# Patient Record
Sex: Female | Born: 1935 | Race: White | Hispanic: No | Marital: Married | Smoking: Former smoker
Health system: Southern US, Community
[De-identification: ages and names within clinical notes are randomized; demographics above are authoritative.]

## PROBLEM LIST (undated history)

## (undated) DIAGNOSIS — R7989 Other specified abnormal findings of blood chemistry: Secondary | ICD-10-CM

## (undated) DIAGNOSIS — M810 Age-related osteoporosis without current pathological fracture: Secondary | ICD-10-CM

## (undated) DIAGNOSIS — E079 Disorder of thyroid, unspecified: Secondary | ICD-10-CM

## (undated) DIAGNOSIS — E039 Hypothyroidism, unspecified: Secondary | ICD-10-CM

## (undated) DIAGNOSIS — L719 Rosacea, unspecified: Secondary | ICD-10-CM

## (undated) DIAGNOSIS — M1990 Unspecified osteoarthritis, unspecified site: Secondary | ICD-10-CM

## (undated) DIAGNOSIS — I350 Nonrheumatic aortic (valve) stenosis: Secondary | ICD-10-CM

## (undated) DIAGNOSIS — K743 Primary biliary cirrhosis: Secondary | ICD-10-CM

## (undated) DIAGNOSIS — I10 Essential (primary) hypertension: Secondary | ICD-10-CM

## (undated) DIAGNOSIS — B029 Zoster without complications: Secondary | ICD-10-CM

## (undated) DIAGNOSIS — I509 Heart failure, unspecified: Secondary | ICD-10-CM

## (undated) DIAGNOSIS — I059 Rheumatic mitral valve disease, unspecified: Secondary | ICD-10-CM

## (undated) DIAGNOSIS — I2510 Atherosclerotic heart disease of native coronary artery without angina pectoris: Secondary | ICD-10-CM

## (undated) DIAGNOSIS — C801 Malignant (primary) neoplasm, unspecified: Secondary | ICD-10-CM

## (undated) DIAGNOSIS — H3530 Unspecified macular degeneration: Secondary | ICD-10-CM

## (undated) DIAGNOSIS — Z944 Liver transplant status: Secondary | ICD-10-CM

## (undated) DIAGNOSIS — H547 Unspecified visual loss: Secondary | ICD-10-CM

## (undated) DIAGNOSIS — I639 Cerebral infarction, unspecified: Secondary | ICD-10-CM

## (undated) DIAGNOSIS — G459 Transient cerebral ischemic attack, unspecified: Secondary | ICD-10-CM

## (undated) HISTORY — PX: JOINT REPLACEMENT: SHX530

## (undated) HISTORY — PX: CORONARY ARTERY BYPASS GRAFT: SHX141

## (undated) HISTORY — PX: BOWEL RESECTION: SHX1257

## (undated) HISTORY — PX: COLONOSCOPY: SHX174

## (undated) HISTORY — PX: LITHOTRIPSY: SUR834

## (undated) HISTORY — PX: HIP ARTHROSCOPY: SUR88

## (undated) HISTORY — DX: Nonrheumatic aortic (valve) stenosis: I35.0

## (undated) HISTORY — DX: Unspecified osteoarthritis, unspecified site: M19.90

## (undated) HISTORY — DX: Rosacea, unspecified: L71.9

## (undated) HISTORY — DX: Hypothyroidism, unspecified: E03.9

## (undated) HISTORY — DX: Other specified abnormal findings of blood chemistry: R79.89

## (undated) HISTORY — DX: Age-related osteoporosis without current pathological fracture: M81.0

---

## 1988-04-23 HISTORY — PX: LIVER TRANSPLANT: SHX410

## 2004-02-09 ENCOUNTER — Other Ambulatory Visit: Payer: Self-pay

## 2004-02-09 ENCOUNTER — Emergency Department: Payer: Self-pay | Admitting: Emergency Medicine

## 2004-03-20 ENCOUNTER — Ambulatory Visit: Payer: Self-pay | Admitting: Unknown Physician Specialty

## 2005-04-17 ENCOUNTER — Emergency Department: Payer: Self-pay | Admitting: Emergency Medicine

## 2005-04-17 ENCOUNTER — Other Ambulatory Visit: Payer: Self-pay

## 2005-06-19 ENCOUNTER — Ambulatory Visit: Payer: Self-pay | Admitting: Internal Medicine

## 2005-10-26 ENCOUNTER — Ambulatory Visit: Payer: Self-pay | Admitting: Rheumatology

## 2006-06-28 ENCOUNTER — Emergency Department: Payer: Self-pay | Admitting: General Practice

## 2006-06-28 ENCOUNTER — Other Ambulatory Visit: Payer: Self-pay

## 2006-07-09 ENCOUNTER — Ambulatory Visit: Payer: Self-pay | Admitting: Internal Medicine

## 2006-08-12 ENCOUNTER — Ambulatory Visit: Payer: Self-pay | Admitting: Ophthalmology

## 2006-10-06 ENCOUNTER — Other Ambulatory Visit: Payer: Self-pay

## 2006-10-06 ENCOUNTER — Emergency Department: Payer: Self-pay | Admitting: Emergency Medicine

## 2006-10-11 ENCOUNTER — Ambulatory Visit: Payer: Self-pay | Admitting: Internal Medicine

## 2007-03-26 ENCOUNTER — Ambulatory Visit: Payer: Self-pay | Admitting: Internal Medicine

## 2007-07-31 ENCOUNTER — Other Ambulatory Visit: Payer: Self-pay

## 2007-07-31 ENCOUNTER — Emergency Department: Payer: Self-pay | Admitting: Emergency Medicine

## 2008-03-25 ENCOUNTER — Ambulatory Visit: Payer: Self-pay | Admitting: Unknown Physician Specialty

## 2008-05-04 ENCOUNTER — Ambulatory Visit: Payer: Self-pay | Admitting: Internal Medicine

## 2008-07-03 ENCOUNTER — Inpatient Hospital Stay: Payer: Self-pay | Admitting: Internal Medicine

## 2009-03-03 ENCOUNTER — Ambulatory Visit: Payer: Self-pay | Admitting: Internal Medicine

## 2009-03-07 ENCOUNTER — Ambulatory Visit: Payer: Self-pay | Admitting: Internal Medicine

## 2009-03-11 ENCOUNTER — Ambulatory Visit: Payer: Self-pay | Admitting: Internal Medicine

## 2009-03-16 ENCOUNTER — Ambulatory Visit: Payer: Self-pay | Admitting: Internal Medicine

## 2009-03-23 ENCOUNTER — Ambulatory Visit: Payer: Self-pay | Admitting: Internal Medicine

## 2009-03-30 ENCOUNTER — Ambulatory Visit: Payer: Self-pay | Admitting: Internal Medicine

## 2009-04-06 ENCOUNTER — Ambulatory Visit: Payer: Self-pay | Admitting: Internal Medicine

## 2009-04-13 ENCOUNTER — Ambulatory Visit: Payer: Self-pay | Admitting: Internal Medicine

## 2009-04-20 ENCOUNTER — Ambulatory Visit: Payer: Self-pay | Admitting: Internal Medicine

## 2009-04-27 ENCOUNTER — Ambulatory Visit: Payer: Self-pay | Admitting: Internal Medicine

## 2009-11-12 ENCOUNTER — Inpatient Hospital Stay: Payer: Self-pay | Admitting: General Practice

## 2009-11-16 ENCOUNTER — Encounter: Payer: Self-pay | Admitting: Internal Medicine

## 2009-11-21 ENCOUNTER — Encounter: Payer: Self-pay | Admitting: Internal Medicine

## 2009-11-28 ENCOUNTER — Emergency Department: Payer: Self-pay | Admitting: Emergency Medicine

## 2010-08-01 ENCOUNTER — Other Ambulatory Visit: Payer: Self-pay

## 2011-03-18 ENCOUNTER — Observation Stay: Payer: Self-pay | Admitting: Internal Medicine

## 2011-04-08 ENCOUNTER — Emergency Department: Payer: Self-pay | Admitting: Emergency Medicine

## 2011-10-03 ENCOUNTER — Ambulatory Visit: Payer: Self-pay | Admitting: Internal Medicine

## 2011-11-26 ENCOUNTER — Emergency Department (HOSPITAL_COMMUNITY): Payer: Self-pay

## 2012-03-10 DIAGNOSIS — N368 Other specified disorders of urethra: Secondary | ICD-10-CM | POA: Insufficient documentation

## 2012-03-10 DIAGNOSIS — N814 Uterovaginal prolapse, unspecified: Secondary | ICD-10-CM | POA: Insufficient documentation

## 2012-04-03 DIAGNOSIS — Z944 Liver transplant status: Secondary | ICD-10-CM | POA: Insufficient documentation

## 2012-04-03 DIAGNOSIS — Z418 Encounter for other procedures for purposes other than remedying health state: Secondary | ICD-10-CM | POA: Insufficient documentation

## 2012-04-07 DIAGNOSIS — I10 Essential (primary) hypertension: Secondary | ICD-10-CM | POA: Insufficient documentation

## 2012-04-07 DIAGNOSIS — E785 Hyperlipidemia, unspecified: Secondary | ICD-10-CM | POA: Insufficient documentation

## 2012-04-14 ENCOUNTER — Emergency Department: Payer: Self-pay | Admitting: Emergency Medicine

## 2012-04-14 LAB — CBC
HGB: 13.6 g/dL (ref 12.0–16.0)
MCH: 30.3 pg (ref 26.0–34.0)
MCHC: 32.4 g/dL (ref 32.0–36.0)
Platelet: 242 10*3/uL (ref 150–440)
RDW: 13.5 % (ref 11.5–14.5)

## 2012-04-14 LAB — COMPREHENSIVE METABOLIC PANEL
Alkaline Phosphatase: 95 U/L (ref 50–136)
Anion Gap: 7 (ref 7–16)
BUN: 19 mg/dL — ABNORMAL HIGH (ref 7–18)
Chloride: 108 mmol/L — ABNORMAL HIGH (ref 98–107)
Creatinine: 0.74 mg/dL (ref 0.60–1.30)
EGFR (Non-African Amer.): >60
Glucose: 110 mg/dL — ABNORMAL HIGH (ref 65–99)
Osmolality: 282 (ref 275–301)
Potassium: 3.8 mmol/L (ref 3.5–5.1)
SGOT(AST): 30 U/L (ref 15–37)
Sodium: 140 mmol/L (ref 136–145)

## 2012-04-14 LAB — URINALYSIS, COMPLETE
Bilirubin,UR: NEGATIVE
Blood: NEGATIVE
Ketone: NEGATIVE
Ph: 8 (ref 4.5–8.0)
Protein: NEGATIVE
Squamous Epithelial: <1

## 2012-04-14 LAB — TROPONIN I: Troponin-I: 0.02 ng/mL

## 2012-12-26 ENCOUNTER — Observation Stay: Payer: Self-pay | Admitting: Internal Medicine

## 2012-12-26 LAB — URINALYSIS, COMPLETE
Bacteria: NONE SEEN
Bilirubin,UR: NEGATIVE
Blood: NEGATIVE
Glucose,UR: NEGATIVE mg/dL
Ketone: NEGATIVE
Leukocyte Esterase: NEGATIVE
Nitrite: NEGATIVE
Ph: 6
Protein: NEGATIVE
RBC,UR: 1 /HPF
Specific Gravity: 1.005
Squamous Epithelial: NONE SEEN
WBC UR: 1 /HPF

## 2012-12-26 LAB — TROPONIN I
Troponin-I: <0.02 ng/mL
Troponin-I: 0.09 ng/mL — ABNORMAL HIGH
Troponin-I: 0.1 ng/mL — ABNORMAL HIGH
Troponin-I: 0.04 ng/mL

## 2012-12-26 LAB — CK TOTAL AND CKMB (NOT AT ARMC)
CK, Total: 99 U/L (ref 21–215)
CK, Total: 65 U/L (ref 21–215)
CK-MB: 2.1 ng/mL (ref 0.5–3.6)
CK-MB: 2.6 ng/mL (ref 0.5–3.6)

## 2012-12-26 LAB — CBC
HCT: 39.6 % (ref 35.0–47.0)
HGB: 13.1 g/dL (ref 12.0–16.0)
MCV: 93 fL (ref 80–100)
RDW: 13.9 % (ref 11.5–14.5)

## 2012-12-26 LAB — COMPREHENSIVE METABOLIC PANEL
Albumin: 3.9 g/dL (ref 3.4–5.0)
BUN: 20 mg/dL — ABNORMAL HIGH (ref 7–18)
Co2: 29 mmol/L (ref 21–32)
Creatinine: 0.97 mg/dL (ref 0.60–1.30)
EGFR (African American): >60
Glucose: 102 mg/dL — ABNORMAL HIGH (ref 65–99)
Osmolality: 271 (ref 275–301)
SGOT(AST): 27 U/L (ref 15–37)
SGPT (ALT): 26 U/L (ref 12–78)
Sodium: 134 mmol/L — ABNORMAL LOW (ref 136–145)

## 2012-12-26 LAB — APTT: Activated PTT: 34.2 secs (ref 23.6–35.9)

## 2012-12-26 LAB — MAGNESIUM: Magnesium: 2 mg/dL

## 2012-12-27 LAB — LIPID PANEL
Cholesterol: 150 mg/dL (ref 0–200)
HDL Cholesterol: 44 mg/dL (ref 40–60)
Ldl Cholesterol, Calc: 80 mg/dL (ref 0–100)
Triglycerides: 129 mg/dL (ref 0–200)
VLDL Cholesterol, Calc: 26 mg/dL (ref 5–40)

## 2012-12-27 LAB — CBC WITH DIFFERENTIAL/PLATELET
Basophil #: 0 10*3/uL (ref 0.0–0.1)
Basophil %: 0.8 %
Eosinophil %: 2.2 %
HCT: 40 % (ref 35.0–47.0)
HGB: 13.5 g/dL (ref 12.0–16.0)
Lymphocyte #: 1.6 10*3/uL (ref 1.0–3.6)
Lymphocyte %: 26.8 %
MCH: 31.3 pg (ref 26.0–34.0)
MCV: 92 fL (ref 80–100)
Monocyte #: 0.6 x10 3/mm (ref 0.2–0.9)
Neutrophil #: 3.5 10*3/uL (ref 1.4–6.5)
Platelet: 251 10*3/uL (ref 150–440)
RBC: 4.32 10*6/uL (ref 3.80–5.20)
WBC: 5.9 10*3/uL (ref 3.6–11.0)

## 2012-12-27 LAB — BASIC METABOLIC PANEL
Calcium, Total: 8.8 mg/dL (ref 8.5–10.1)
Co2: 28 mmol/L (ref 21–32)
Creatinine: 0.74 mg/dL (ref 0.60–1.30)
EGFR (African American): >60
EGFR (Non-African Amer.): >60
Glucose: 78 mg/dL (ref 65–99)
Osmolality: 272 (ref 275–301)
Potassium: 3.6 mmol/L (ref 3.5–5.1)

## 2012-12-27 LAB — TSH: Thyroid Stimulating Horm: 3.64 u[IU]/mL

## 2012-12-28 ENCOUNTER — Emergency Department: Payer: Self-pay | Admitting: Emergency Medicine

## 2013-10-10 ENCOUNTER — Emergency Department: Payer: Self-pay | Admitting: Emergency Medicine

## 2013-10-10 LAB — SEDIMENTATION RATE: ERYTHROCYTE SED RATE: 8 mm/h (ref 0–30)

## 2013-12-03 ENCOUNTER — Emergency Department: Payer: Self-pay | Admitting: Emergency Medicine

## 2013-12-03 LAB — COMPREHENSIVE METABOLIC PANEL
ALT: 32 U/L
ANION GAP: 5 — AB (ref 7–16)
AST: 24 U/L (ref 15–37)
Albumin: 3.8 g/dL (ref 3.4–5.0)
Alkaline Phosphatase: 92 U/L
BILIRUBIN TOTAL: 0.2 mg/dL (ref 0.2–1.0)
BUN: 22 mg/dL — ABNORMAL HIGH (ref 7–18)
CHLORIDE: 101 mmol/L (ref 98–107)
CREATININE: 1.16 mg/dL (ref 0.60–1.30)
Calcium, Total: 9.8 mg/dL (ref 8.5–10.1)
Co2: 31 mmol/L (ref 21–32)
EGFR (African American): 49 — ABNORMAL LOW
GFR CALC NON AF AMER: 42 — AB
Glucose: 113 mg/dL — ABNORMAL HIGH (ref 65–99)
OSMOLALITY: 278 (ref 275–301)
POTASSIUM: 4.2 mmol/L (ref 3.5–5.1)
SODIUM: 137 mmol/L (ref 136–145)
TOTAL PROTEIN: 7.3 g/dL (ref 6.4–8.2)

## 2013-12-03 LAB — CBC
HCT: 35.6 % (ref 35.0–47.0)
HGB: 11.1 g/dL — AB (ref 12.0–16.0)
MCH: 27.8 pg (ref 26.0–34.0)
MCHC: 31.3 g/dL — ABNORMAL LOW (ref 32.0–36.0)
MCV: 89 fL (ref 80–100)
PLATELETS: 292 10*3/uL (ref 150–440)
RBC: 4 10*6/uL (ref 3.80–5.20)
RDW: 16.8 % — ABNORMAL HIGH (ref 11.5–14.5)
WBC: 6.6 10*3/uL (ref 3.6–11.0)

## 2013-12-03 LAB — URINALYSIS, COMPLETE
BACTERIA: NONE SEEN
Bilirubin,UR: NEGATIVE
Blood: NEGATIVE
Glucose,UR: NEGATIVE mg/dL (ref 0–75)
KETONE: NEGATIVE
LEUKOCYTE ESTERASE: NEGATIVE
Nitrite: NEGATIVE
PH: 7 (ref 4.5–8.0)
Protein: NEGATIVE
Specific Gravity: 1.005 (ref 1.003–1.030)
Squamous Epithelial: NONE SEEN
WBC UR: <1 /HPF (ref 0–5)

## 2013-12-03 LAB — TROPONIN I

## 2014-01-27 DIAGNOSIS — I700 Atherosclerosis of aorta: Secondary | ICD-10-CM | POA: Insufficient documentation

## 2014-02-19 ENCOUNTER — Encounter: Payer: Self-pay | Admitting: Surgery

## 2014-02-21 ENCOUNTER — Encounter: Payer: Self-pay | Admitting: Surgery

## 2014-03-15 DIAGNOSIS — I480 Paroxysmal atrial fibrillation: Secondary | ICD-10-CM | POA: Insufficient documentation

## 2014-03-15 DIAGNOSIS — I2510 Atherosclerotic heart disease of native coronary artery without angina pectoris: Secondary | ICD-10-CM | POA: Insufficient documentation

## 2014-03-23 ENCOUNTER — Encounter: Payer: Self-pay | Admitting: Surgery

## 2014-03-26 ENCOUNTER — Encounter: Payer: Self-pay | Admitting: General Surgery

## 2014-08-06 ENCOUNTER — Other Ambulatory Visit
Admit: 2014-08-06 | Disposition: A | Discharge status: Home / Self Care | Payer: Self-pay | Attending: Internal Medicine | Admitting: Internal Medicine

## 2014-08-06 LAB — CLOSTRIDIUM DIFFICILE(ARMC)

## 2014-08-13 NOTE — Consult Note (Signed)
PATIENT NAME:  Brenda Roman, DILLAVOU MR#:  [id number] DATE OF BIRTH:  [birth year]  DATE OF CONSULTATION:  12/26/2012  REFERRING PHYSICIAN:  Nicholes Mango, MD CONSULTING PHYSICIAN:  Corey Skains, MD  REASON FOR CONSULTATION: Aortic valve stenosis, hypertension, hyperlipidemia with atrial fibrillation with rapid ventricular rate.   CHIEF COMPLAINT: "I have chest pressure and shortness of breath."   HISTORY OF PRESENT ILLNESS: This is an 81 year old female with known severe aortic stenosis, mitral valve insufficiency, left ventricular hypertrophy with hypertension and hyperlipidemia, on appropriate medications, occasionally having some palpitations. The patient has had an increase in medication management for her palpitations including increased dosages of medications in the evenings of metoprolol. This has not helped. When she was coming to the Emergency Room, she had chest pain, shortness of breath, weakness and concerns consistent with angina. Upon arrival, she had atrial fibrillation with rapid ventricular rate, with now spontaneous conversion to normal sinus rhythm, with left bundle branch block. The patient did have a minimal troponin elevation of less than 0.1, consistent with demand ischemia, and no current evidence of myocardial infarction. Currently, she is stable without any further symptoms.  REVIEW OF SYSTEMS: Remainder review of systems negative for syncope, dizziness, weakness, fatigue, vision change, ringing in the ears, hearing loss, cough, congestion, heartburn, nausea, vomiting, diarrhea, bloody stools, stomach pain, extremity pain, leg weakness, cramping of the buttocks, known blood clots, headaches, blackouts, dizzy spells, nosebleeds, skin lesions, skin rashes.   PAST MEDICAL HISTORY: 1.  Aortic stenosis.  2.  Mitral valve insufficiency.  3.  Hypertension.  4.  Hyperlipidemia.  5.  Atrial fibrillation.   FAMILY HISTORY: No family members with early onset of cardiovascular disease or  hypertension.   SOCIAL HISTORY: Currently denies alcohol or tobacco use.   ALLERGIES: As listed.   MEDICATIONS: As listed.   PHYSICAL EXAMINATION: VITAL SIGNS: Blood pressure is 145/80 bilaterally, heart rate 72 upright, reclining, and regular.  GENERAL: She is a well-appearing female in no acute distress.  HEAD, EYES, EARS, NOSE, AND THROAT: No icterus, thyromegaly, ulcers, hemorrhage or xanthelasma.  CARDIOVASCULAR: Regular rate and rhythm with normal S1, soft S2, with a III/VI right upper sternal border murmur radiating throughout and into the carotids. PMI is inferiorly displaced. Carotid upstroke normal with murmur radiation. Jugular venous pressure is normal.  LUNGS: A few basilar crackles with normal respirations.  ABDOMEN: Soft, nontender, without hepatosplenomegaly or masses. Abdominal aorta is normal size without bruit.  EXTREMITIES: Show 2+ bilateral pulses in dorsal, pedal, radial and femoral arteries without lower extremity edema, cyanosis, clubbing or ulcers.  NEUROLOGIC: Normal mood and affect.   ASSESSMENT: An 81 year old female with severe aortic stenosis, mitral valve insufficiency, hypertension, hyperlipidemia, abnormal EKG with atrial fibrillation and rapid ventricular rate, now spontaneously converted to normal sinus rhythm, with minimal elevation of troponin consistent with demand ischemia.   RECOMMENDATIONS: 1.  Continue metoprolol and increase of metoprolol succinate to 100 mg in the morning and 50 at night to maintain normal rhythm.  2.  No anticoagulation at this time due to  normal rhythm and concerns the patient has for bleeding complications and other use of Plavix in the recent past. She will have anticoagulation discussion as an outpatient if there is further need. 3.  No further intervention of minimal demand ischemia with minimal elevation of troponin most consistent with aortic valve stenosis and atrial fibrillation.  4.  Echocardiogram for extent of LV  dysfunction and valvular heart disease.  5.  Ambulate and follow for any other  significant symptoms and adjustments of hypertension medications, with possible discharge to home thereafter.   ____________________________ Corey Skains, MD [id number] D: 12/26/2012 13:15:13 ET T: 12/26/2012 13:38:42 ET JOB#: [id number]  cc: Corey Skains, MD, <Dictator> Corey Skains MD ELECTRONICALLY SIGNED 12/31/2012 14:26

## 2014-08-13 NOTE — Discharge Summary (Signed)
PATIENT NAME:  Brenda Roman, MCCAFFREY MR#:  [id number] DATE OF BIRTH:  [birth year]  DATE OF ADMISSION:  12/26/2012 DATE OF DISCHARGE:  12/27/2012  Ms. Sausedo is an 81 year old white lady who presented to the Emergency Room with palpitation, was found to be in atrial fib with a rapid ventricular response. The patient had taken an extra dose of her beta blocker without relief. In the ER, she was given IV Lopressor and then admitted to the ICU.   PAST MEDICAL HISTORY:  Notable for chronic atrial fibrillation for which She took aspirin and Plavix. The patient had a known coronary artery disease and had a MI in 81 and 1998.  She had a CABG in 1997. She had a stent placement in 2005. The patient had a history of hyperlipidemia, primary biliary cirrhosis, osteoporosis, hypertension, previous TIA, aged history of hiatal hernia, history of partial bowel obstruction and a history of a compression fracture.   PAST SURGICAL HISTORY:  Include her previous bypass grafting and stent placement. She had right cataract extraction with lens implant. She had had an ORIF of the right hip. She had a liver transplant in 1991. She had an ascending colon resection in the past due to GI bleeding.   THE PATIENT WAS NOTED TO BE ALLERGIC TO SULFA, CODEINE AND NOVOCAINE.   MEDICATIONS ON ADMISSION:  Include: 1.  Aspirin 81 mg daily. 2.  Vytorin 10/20 mg 1 daily. 3.  Prednisone 5 mg daily. 4.  Metoprolol 25 mg daily. 5.  Mycophenolate 1 tablet twice a day.  6.  Metoprolol succinate 100 mg daily. 7.  Meclizine 12.5 mg t.i.d. p.r.n. 8.  Plavix 75 mg daily.   ADMISSION PHYSICAL EXAMINATION:  Revealed a temperature of 97.8, pulse 74, respirations 18 and blood pressure 173/77. Pulse oximetry was 96%. Examination as described by the admitting physician was most notable for her irregular, irregular heart rate.   The patient's chest x-ray showed no acute findings. EKG was notable for left bundle branch block. CBC was normal. LFTs were normal.  Electrolytes were notable only for sodium 134.   The patient was admitted initially to the ICU but eventually was transferred to telemetry. She was continued on her metoprolol succinate 100 mg daily; 50 mg in the evening was added. She eventually converted to a normal sinus rhythm. Her blood pressure did remain elevated throughout her hospitalization. Cardiac enzymes were obtained. There was a very minor bump in the troponin, which was probably stress ischemia. She was seen in consultation by cardiology who did not feel she needed any further workup and cleared her for discharge today.   DISCHARGE DIAGNOSIS:  Atrial fibrillation with a rapid ventricular response -- resolved.   DISCHARGE MEDICATIONS: 1.  Prednisone 5 mg daily.  2.  Vytorin 10/20 mg daily.  3.  Mycophenolate 500 mg b.i.d.  4.  Metoprolol succinate 100 mg each morning.  5.  Metoprolol succinate 50 mg each evening.  6.  Plavix 75 mg daily.  7.  Aspirin 81 mg daily.   DISCHARGE DISPOSITION: The patient was discharged on a low-sodium, low-fat diet with activity as tolerated. It is noted that although she was started on anticoagulation in the hospital, she decided not to stay on it chronically. She is staying on her aspirin and Plavix.   The patient is to follow up with Dr. Ouida Sills in approximately 1 week.   ____________________________ Hewitt Blade. Sarina Ser, MD jbw:ce D: 12/27/2012 10:22:49 ET T: 12/27/2012 10:38:17 ET JOB#: [id number]  cc: Hewitt Blade. Walker III,  MD, <Dictator> Lottie Mussel III MD ELECTRONICALLY SIGNED 12/28/2012 11:25

## 2014-08-13 NOTE — H&P (Signed)
PATIENT NAME:  Brenda Roman, AUVIL MR#:  [id number] DATE OF BIRTH:  [birth year]  DATE OF ADMISSION:  12/26/2012  PRIMARY CARE PHYSICIAN: Ocie Cornfield. Ouida Sills, MD  REFERRING PHYSICIAN: Yetta Numbers. Karma Greaser, MD  CHIEF COMPLAINT: Palpitations.   HISTORY OF PRESENT ILLNESS: The patient is an 81 year old female presenting to the ER with a chief complaint of palpitations since yesterday evening after supper. The patient has chronic history of atrial fibrillation and takes metoprolol extended release on a regular basis. Yesterday evening after supper, the patient started having palpitations, with no chest pain. She took Toprol 25 mg p.o. with no significant improvement. Before bedtime, she took another dose of Toprol 25 mg with no significant improvement. As she was persistently having palpitations, she came into the ER. Denies any chest pain or dizziness. In the ER, the patient was found to be in atrial fibrillation with RVR with a heart rate around 130s. She has received Lopressor in the IV form, following which her blood pressure as well as heart rate was well controlled. The patient was also given p.o. metoprolol, and hospitalist team is called to admit the patient. Initial troponin was negative, but the repeat one was elevated at 0.09. The patient denies any chest pain, shortness of breath, abdominal pain, nausea, vomiting, diarrhea. Her cardiologist is Dr. Ubaldo Glassing.   PAST MEDICAL HISTORY:  1. Chronic atrial fibrillation, on aspirin and Plavix.  2. Coronary artery disease status post MI in 1996 and 1998, status post CABG in 1997, status post stent placement in 2005 after bypass.  3. Hyperlipidemia.  4. Primary biliary cirrhosis, status post liver transplant in 1991.  5. Osteoporosis.  6. Intermittent vertigo.  7. Hypertension with white coat syndrome 8. History of TIA versus global low flow and ischemia, with history of carotid stenosis.  9. Atrial fibrillation diagnosed in the year 2010.  10. History of  compression fracture and left hand fracture.  11. Partial bowel obstruction.  12. Hiatal hernia.   PAST SURGICAL HISTORY:  1. Status post coronary artery bypass grafting in year 1997 and stent placement in August 2005. 2. Right cataract surgery.  3. Open reduction and internal fixation of the right hip.  4. Liver transplant in [place]. 5. Ascending colon resection secondary to bleeding.  ALLERGIES: SULFA, CODEINE, NOVOCAINE.   PSYCHOSOCIAL HISTORY: Lives at home with husband. Denies any history of smoking, alcohol or illicit drug use. She used to smoke and quit smoking in 1967.   FAMILY HISTORY: Both mother and her father had history of congestive heart failure. Father had coronary artery disease and MI. Sister with history of multiple sclerosis. Brother with ALS.   HOME MEDICATIONS:  1. Aspirin 81 mg once daily. 2. Vytorin 10/20 one tablet p.o. once daily.  3. Prednisone 5 mg once daily as the patient has history of liver transplantation. 4. Metoprolol 25 mg once a day. 5. Mycophenolate 1 tablet p.o. 2 times a day.  6. Metoprolol succinate 100 mg p.o. once a day.  7. Meclizine 12.5 mg 3 times a day p.r.n.  8. Plavix 75 mg once daily.   REVIEW OF SYSTEMS:  CONSTITUTIONAL: Denies any fever or fatigue.  EYES: Denies blurry vision. No eye pain.  EARS, NOSE, THROAT: No epistaxis or discharge.  RESPIRATION: Denies cough, COPD.  CARDIOVASCULAR: Denies any chest pain, but positive palpitations. Denies any syncope.  GASTROINTESTINAL: Denies nausea, vomiting, diarrhea.  GENITOURINARY: No dysuria, hematuria.  GYNECOLOGIC AND BREASTS: Denies breast mass or vaginal discharge.  ENDOCRINE: Denies polyuria, nocturia, thyroid problems.  HEMATOLOGIC AND LYMPHATIC: No anemia, easy bruising or bleeding.  INTEGUMENTARY: No acne, rash, lesions.  MUSCULOSKELETAL: No joint pain in the neck, back. Denies any gout.  NEUROLOGIC: No vertigo or ataxia.  PSYCHIATRIC: No ADD, OCD.   PHYSICAL EXAMINATION:   VITAL SIGNS: Temperature 97.8, pulse 74, respirations 18, blood pressure 173/77, pulse oximetry 96%.  GENERAL APPEARANCE: Not under acute distress. Moderately built and thin-looking Caucasian female.  HEENT: Normocephalic, atraumatic. Pupils are equally reacting to light and accommodation. No scleral icterus. No conjunctival injection. No sinus tenderness. No postnasal drip.  NECK: Supple. No JVD. No thyromegaly.  LUNGS: Clear to auscultation bilaterally. No accessory muscle usage. No anterior chest wall tenderness on palpation.  CARDIOVASCULAR: Irregularly irregular.  GASTROINTESTINAL: Soft. Bowel sounds are positive in all 4 quadrants. Nontender, nondistended. No hepatosplenomegaly.  NEUROLOGIC: Awake, alert and oriented x3. Motor and sensory grossly intact. Reflexes are 2+. The patient is very hard of hearing.  EXTREMITIES: No edema. No cyanosis. No clubbing.  MUSCULOSKELETAL: No joint effusion, tenderness or erythema.  SKIN: Normal turgor. No rashes. No lesions.  PSYCHIATRIC: Normal mood and affect.   LABORATORY AND IMAGING STUDIES: LFTs normal. CK total 99, CPK-MB 2.1, troponin less than 0.02, repeat is 0.09. CBC normal. Activated PTT 34.2. BMP: BUN 20, creatinine 0.97, sodium 134, potassium 4.0, chloride 101, CO2 29, GFR greater than 60, anion gap 4, serum osmolality 271, calcium 9.1, lipase is elevated at 416, magnesium 2.0. A 12-lead EKG with left bundle branch block and left axis deviation, left bundle branch block is old as reported by the ER physician. Chest x-ray: No acute findings.   ASSESSMENT AND PLAN: An 82 year old Caucasian female with a do not resuscitate code status is presenting to the ER with a chief complaint of palpitations with a detectable troponin. Will be admitted with the following assessment and plan.   1. Acute atrial fibrillation with rapid ventricular response, on chronic atrial fibrillation. Admit her to telemetry.  Will give her Lopressor IV as-needed basis for  atrial fibrillation with rapid ventricular response. Will continue Toprol-XL 100 mg p.o. once daily and add 50 mg p.o. once daily for better control of her heart rate.  Cardiology consult is placed to Dr. Ubaldo Glassing.  Will cycle cardiac biomarkers as troponin is up-trending, which could be from demand ischemia.  Will obtain 2-D echocardiogram.  2. Detectable troponin, which could be from demand ischemia from atrial fibrillation with rapid ventricular response. Will implement ACS protocol. The patient will be on oxygen, nitroglycerin, aspirin, beta blocker and statin. Cardiology consult is placed to Dr. Ubaldo Glassing. 3. History of primary biliary cirrhosis, status post liver transplantation. Will continue prednisone, and the patient is to get her home medication regarding transplantation and continue them.  4. Intermittent episodes of vertigo. Will provide her meclizine on as-needed basis.  5. Old history of coronary artery disease status post coronary artery bypass grafting. The patient will be on aspirin, Plavix and statin. Will continue her home medication, beta blocker.  6. Hypertension. Will resume home medications and up-titrate medications on as-needed basis. 7. Will provide her gastrointestinal and deep vein thrombosis prophylaxis.   CODE STATUS: She is DNR. Her husband is medical power of attorney.   Diagnosis and plan of care were discussed in detail with the patient. She is aware of the plan. The patient will be transferred to Dr. Frazier Richards in a.m.   TOTAL TIME SPENT ON ADMISSION: 45 minutes.   ____________________________ Nicholes Mango, MD ag:OSi D: 12/26/2012 06:30:23 ET T: 12/26/2012  07:32:52 ET JOB#: [id number]  cc: Nicholes Mango, MD, <Dictator> Ocie Cornfield. Ouida Sills, MD Nicholes Mango MD ELECTRONICALLY SIGNED 12/29/2012 6:42

## 2014-08-23 ENCOUNTER — Emergency Department: Payer: Medicare Other

## 2014-08-23 ENCOUNTER — Encounter: Payer: Self-pay | Admitting: Emergency Medicine

## 2014-08-23 ENCOUNTER — Inpatient Hospital Stay
Admission: EM | Admit: 2014-08-23 | Discharge: 2014-08-25 | DRG: 069 | Disposition: A | Discharge status: Home Health | Payer: Medicare Other | Attending: Internal Medicine | Admitting: Internal Medicine

## 2014-08-23 DIAGNOSIS — R4781 Slurred speech: Secondary | ICD-10-CM | POA: Diagnosis present

## 2014-08-23 DIAGNOSIS — H3530 Unspecified macular degeneration: Secondary | ICD-10-CM | POA: Diagnosis present

## 2014-08-23 DIAGNOSIS — R110 Nausea: Secondary | ICD-10-CM

## 2014-08-23 DIAGNOSIS — G9341 Metabolic encephalopathy: Secondary | ICD-10-CM

## 2014-08-23 DIAGNOSIS — Z944 Liver transplant status: Secondary | ICD-10-CM | POA: Diagnosis not present

## 2014-08-23 DIAGNOSIS — R471 Dysarthria and anarthria: Secondary | ICD-10-CM | POA: Diagnosis not present

## 2014-08-23 DIAGNOSIS — I63239 Cerebral infarction due to unspecified occlusion or stenosis of unspecified carotid arteries: Secondary | ICD-10-CM

## 2014-08-23 DIAGNOSIS — I252 Old myocardial infarction: Secondary | ICD-10-CM | POA: Diagnosis not present

## 2014-08-23 DIAGNOSIS — Z7952 Long term (current) use of systemic steroids: Secondary | ICD-10-CM | POA: Diagnosis not present

## 2014-08-23 DIAGNOSIS — G451 Carotid artery syndrome (hemispheric): Secondary | ICD-10-CM | POA: Diagnosis not present

## 2014-08-23 DIAGNOSIS — Z8673 Personal history of transient ischemic attack (TIA), and cerebral infarction without residual deficits: Secondary | ICD-10-CM | POA: Diagnosis not present

## 2014-08-23 DIAGNOSIS — Z7982 Long term (current) use of aspirin: Secondary | ICD-10-CM | POA: Diagnosis not present

## 2014-08-23 DIAGNOSIS — I4891 Unspecified atrial fibrillation: Secondary | ICD-10-CM | POA: Diagnosis present

## 2014-08-23 DIAGNOSIS — Z7901 Long term (current) use of anticoagulants: Secondary | ICD-10-CM | POA: Diagnosis not present

## 2014-08-23 DIAGNOSIS — Z87440 Personal history of urinary (tract) infections: Secondary | ICD-10-CM

## 2014-08-23 DIAGNOSIS — Z79899 Other long term (current) drug therapy: Secondary | ICD-10-CM

## 2014-08-23 DIAGNOSIS — G459 Transient cerebral ischemic attack, unspecified: Secondary | ICD-10-CM | POA: Diagnosis not present

## 2014-08-23 DIAGNOSIS — Z951 Presence of aortocoronary bypass graft: Secondary | ICD-10-CM

## 2014-08-23 DIAGNOSIS — E871 Hypo-osmolality and hyponatremia: Secondary | ICD-10-CM | POA: Diagnosis present

## 2014-08-23 DIAGNOSIS — E860 Dehydration: Secondary | ICD-10-CM | POA: Diagnosis present

## 2014-08-23 DIAGNOSIS — I2510 Atherosclerotic heart disease of native coronary artery without angina pectoris: Secondary | ICD-10-CM | POA: Diagnosis present

## 2014-08-23 DIAGNOSIS — M316 Other giant cell arteritis: Secondary | ICD-10-CM | POA: Diagnosis present

## 2014-08-23 DIAGNOSIS — I639 Cerebral infarction, unspecified: Secondary | ICD-10-CM | POA: Diagnosis not present

## 2014-08-23 HISTORY — DX: Atherosclerotic heart disease of native coronary artery without angina pectoris: I25.10

## 2014-08-23 HISTORY — DX: Liver transplant status: Z94.4

## 2014-08-23 HISTORY — DX: Unspecified macular degeneration: H35.30

## 2014-08-23 LAB — COMPREHENSIVE METABOLIC PANEL
ALBUMIN: 4 g/dL (ref 3.5–5.0)
ALK PHOS: 58 U/L (ref 38–126)
ALT: 17 U/L (ref 14–54)
AST: 22 U/L (ref 15–41)
Anion gap: 8 (ref 5–15)
BILIRUBIN TOTAL: 0.5 mg/dL (ref 0.3–1.2)
BUN: 13 mg/dL (ref 6–20)
CALCIUM: 8.9 mg/dL (ref 8.9–10.3)
CO2: 27 mmol/L (ref 22–32)
Chloride: 94 mmol/L — ABNORMAL LOW (ref 101–111)
Creatinine, Ser: 0.84 mg/dL (ref 0.44–1.00)
GFR calc Af Amer: >60 mL/min (ref >60)
GFR calc non Af Amer: >60 mL/min (ref >60)
GLUCOSE: 97 mg/dL (ref 65–99)
Potassium: 3.7 mmol/L (ref 3.5–5.1)
Sodium: 129 mmol/L — ABNORMAL LOW (ref 135–145)
TOTAL PROTEIN: 7 g/dL (ref 6.5–8.1)

## 2014-08-23 LAB — URINALYSIS COMPLETE WITH MICROSCOPIC (ARMC ONLY)
Bacteria, UA: NONE SEEN
Bilirubin Urine: NEGATIVE
GLUCOSE, UA: NEGATIVE mg/dL
Hgb urine dipstick: NEGATIVE
Leukocytes, UA: NEGATIVE
NITRITE: NEGATIVE
Protein, ur: NEGATIVE mg/dL
SPECIFIC GRAVITY, URINE: 1.011 (ref 1.005–1.030)
Squamous Epithelial / LPF: NONE SEEN
pH: 7 (ref 5.0–8.0)

## 2014-08-23 LAB — CBC WITH DIFFERENTIAL/PLATELET
Basophils Absolute: 0 10*3/uL (ref 0–0.1)
Basophils Relative: 1 %
Eosinophils Absolute: 0 10*3/uL (ref 0–0.7)
HCT: 33.8 % — ABNORMAL LOW (ref 35.0–47.0)
Hemoglobin: 10.5 g/dL — ABNORMAL LOW (ref 12.0–16.0)
LYMPHS ABS: 1 10*3/uL (ref 1.0–3.6)
Lymphocytes Relative: 16 %
MCH: 25.7 pg — ABNORMAL LOW (ref 26.0–34.0)
MCHC: 31.2 g/dL — ABNORMAL LOW (ref 32.0–36.0)
MCV: 82.6 fL (ref 80.0–100.0)
MONO ABS: 0.6 10*3/uL (ref 0.2–0.9)
Neutro Abs: 4.6 10*3/uL (ref 1.4–6.5)
Neutrophils Relative %: 73 %
Platelets: 298 10*3/uL (ref 150–440)
RBC: 4.09 MIL/uL (ref 3.80–5.20)
RDW: 15.7 % — ABNORMAL HIGH (ref 11.5–14.5)
WBC: 6.3 10*3/uL (ref 3.6–11.0)

## 2014-08-23 LAB — LIPID PANEL
CHOLESTEROL: 157 mg/dL (ref 0–200)
HDL: 30 mg/dL — AB (ref >40)
LDL Cholesterol: 107 mg/dL — ABNORMAL HIGH (ref 0–99)
Total CHOL/HDL Ratio: 5.2 RATIO
Triglycerides: 99 mg/dL (ref <150)
VLDL: 20 mg/dL (ref 0–40)

## 2014-08-23 MED ORDER — SENNOSIDES-DOCUSATE SODIUM 8.6-50 MG PO TABS: 1.0000 | ORAL_TABLET | Freq: Every evening | ORAL | Status: DC | PRN

## 2014-08-23 MED ORDER — PREDNISONE 10 MG PO TABS: 5.0000 mg | ORAL_TABLET | Freq: Every day | ORAL | Status: DC

## 2014-08-23 MED ORDER — ACETAMINOPHEN 325 MG PO TABS
650.0000 mg | ORAL_TABLET | Freq: Four times a day (QID) | ORAL | Status: DC | PRN
  Administered 2014-08-24 – 2014-08-25 (×3): 650 mg via ORAL
  Filled 2014-08-23 (×4): qty 2

## 2014-08-23 MED ORDER — ASPIRIN EC 81 MG PO TBEC: 81.0000 mg | DELAYED_RELEASE_TABLET | Freq: Every day | ORAL | Status: DC

## 2014-08-23 MED ORDER — SODIUM CHLORIDE 0.9 % IV SOLN
1000.0000 mL | Freq: Once | INTRAVENOUS | Status: AC
  Administered 2014-08-23: 1000 mL via INTRAVENOUS

## 2014-08-23 MED ORDER — SODIUM CHLORIDE 0.9 % IV SOLN
INTRAVENOUS | Status: DC
  Administered 2014-08-23 – 2014-08-24 (×3): via INTRAVENOUS

## 2014-08-23 MED ORDER — ASPIRIN 81 MG PO CHEW
324.0000 mg | CHEWABLE_TABLET | Freq: Once | ORAL | Status: DC
  Filled 2014-08-23: qty 4

## 2014-08-23 MED ORDER — SIMVASTATIN 20 MG PO TABS: 20.0000 mg | ORAL_TABLET | Freq: Every day | ORAL | Status: DC

## 2014-08-23 MED ORDER — SODIUM CHLORIDE 0.9 % IJ SOLN: 3.0000 mL | Freq: Two times a day (BID) | INTRAMUSCULAR | Status: DC

## 2014-08-23 MED ORDER — ACETAMINOPHEN 650 MG RE SUPP: 650.0000 mg | Freq: Four times a day (QID) | RECTAL | Status: DC | PRN

## 2014-08-23 MED ORDER — RIVAROXABAN 20 MG PO TABS: 20.0000 mg | ORAL_TABLET | Freq: Every day | ORAL | Status: DC

## 2014-08-23 NOTE — H&P (Signed)
Patient Demographics  Brenda Roman, is a 81 y.o. female  MRN: [id number]   DOB - [birth year]  Admit Date - 08/23/2014  Outpatient Primary MD for the patient is Kirk Ruths., MD    Chief Complaint  Patient presents with  . Headache     HPI  Brenda Roman  is a 81 y.o. female with past medical history significant for ischemic heart disease, non-Q-wave MI, atrial fibrillation, temporal arteritis, liver transplant on CellCept and steroids, TIA who presents from her PCPs office after 1 week of weakness. Patient has been recently treated for urinary tract infection since that time patient's family has noted her to be increasingly lethargic and weak. She has also been complaining of a severe headache for the past 3 days. She saw Dr. Ginette Pitman this past Friday due to her ongoing weakness and headache. They felt that she was dehydrated and encourage her to drink plenty of fluids. They did recommend if she wasn't feeling better to go back to the doctor for follow-up. She presented today to her PCPs office with above issues. She was asked to come to the ER as she is severely weak and her family had noticed slurred speech this morning. My examination patient deathly has slurred speech and garbled speech. She has no other focal neurological deficits. She has a history of temporal arteritis and her ESR was recently checked which was normal   Review of Systems     Due to incoherent speech were unable to obtain adequate review of systems and the patient. Past medical History Past Medical History  Diagnosis Date  . Liver transplanted   . Macular degeneration   . Coronary artery disease       Past Surgical History Past Surgical History  Procedure Laterality Date  . Coronary artery bypass graft      Social  History History  Substance Use Topics  . Smoking status: Never Smoker   . Smokeless tobacco: Not on file  . Alcohol Use: No      Family History Unknown  Prior to Admission medications   Calcium carbonate 1 tablet twice a day Aspirin 81 mg 2 tablets daily Vitamin D 3000 international unit daily Vytorin 10/20 one tablet nightly Metoprolol 100 mg a.m. Metabolic 50 mg p.m. Metoprolol 25 mg twice a day Multivitamin CellCept 500 mg every 12 hours Nitroglycerin glycerin supplement. Chest pain Prednisone 5 mg 1 tablet by mouth daily Xarelto 20 mg times daily Ambien 5 g at bedtime     No Known Allergies  Physical Exam  Vitals  Blood pressure 158/65, pulse 81, temperature 100.6 F (38.1 C), temperature source Oral, resp. rate 20, height 5' 5"  (1.651 m), weight 61.236 kg (135 lb), SpO2 97 %.  Constitutional:  Well-developed and well-nourished. No distress.  HENT:  Head: Normocephalic and atraumatic.  Mouth/Throat: Oropharynx is clear and moist.  Eyes: Pupils are equal, round, and reactive to light.  Neck: Normal range of motion.  Neck supple. No JVD present. No tracheal deviation present. No thyromegaly present.  Cardiovascular: Normal rate, regular rhythm and normal heart sounds.  Exam reveals no gallop.   No murmur heard. Respiratory: Effort normal and breath sounds normal. No respiratory distress. No wheezing, crackles, rales. GI: Soft. No distension. There is no tenderness. There is no rebound and no guarding.  Musculoskeletal:No edema or tenderness.  Neurological: Patient definitely exhibits garbled speech. It appears that she may have a very slight left facial droop as well. Skin: Skin is warm and dry. No rash noted.  Psychiatric:  Normal mood. Flat affect   Data Review  CBC  Recent Labs Lab 08/23/14 1135  WBC 6.3  HGB 10.5*  HCT 33.8*  PLT 298  MCV 82.6  MCH 25.7*  MCHC 31.2*  RDW 15.7*  LYMPHSABS 1.0  MONOABS 0.6  EOSABS 0.0  BASOSABS 0.0    ------------------------------------------------------------------------------------------------------------------  Chemistries   Recent Labs Lab 08/23/14 1135  NA 129*  K 3.7  CL 94*  CO2 27  GLUCOSE 97  BUN 13  CREATININE 0.84  CALCIUM 8.9  AST 22  ALT 17  ALKPHOS 58  BILITOT 0.5   ------------------------------------------------------------------------------------------------------------------ estimated creatinine clearance is 41.7 mL/min (by C-G formula based on Cr of 0.84). ------------------------------------------------------------------------------------------------------------------     ---------------------------------------------------------------------------------------------------------------  Urinalysis    Component Value Date/Time   COLORURINE YELLOW* 08/23/2014 1032   APPEARANCEUR CLEAR* 08/23/2014 1032   LABSPEC 1.011 08/23/2014 1032   PHURINE 7.0 08/23/2014 1032   GLUCOSEU NEGATIVE 08/23/2014 1032   HGBUR NEGATIVE 08/23/2014 [place] 08/23/2014 1032   KETONESUR TRACE* 08/23/2014 1032   PROTEINUR NEGATIVE 08/23/2014 1032   NITRITE NEGATIVE 08/23/2014 1032   [place] 08/23/2014 1032    ----------------------------------------------------------------------------------------------------------------  Imaging results:  CT-scan of the brain IMPRESSION: 1. No acute intracranial abnormality. 2. Cerebral atrophy and small vessel ischemic change  EKG pending   Assessment & Plan  1. Acute CVA: It appears clinically that the patient may have suffered a stroke. It is noted that she is on Xarelto for atrial fibrillation. I have no other explanation for garbled speech. Urine analysis chest x-ray and CT the head are normal no signs of infection or stroke. Most and poorly patient was complaining of a headache about a week ago. Her ESR taken at the office recently was within normal limits. Her head CT here did not show  evidence of subarachnoid hemorrhage. She is not complaining of a headache currently. Patient will be admitted to telemetry. We will obtain MRI of the brain,2D echocardiogram,and carotid Dopplers. I have ordered neuro checks every 4 hours. We will continue aspirin and add statin therapy. I have consulted  physical therapy and speech pathology. I will hold hypertensive medications to allow brain perfusion. I will also order a neurology consultation.  2. History of atrial fibrillation: We'll continue xarelto. I will hold metoprolol due to problem #1. 3. History of PBC and liver transplant: Patient's currently on CellCept and prednisone which I will continue. 4. Dehydration it does appear patient may be slightly dehydrated and since she is taking nothing by mouth due to problem number #1 as she did not pass her swallow study I will order IV fluids.  DVT Prophylaxis xarelto   Family Communication: Admission, patients condition and plan of care including tests being ordered have been discussed with the patient and family who indicate understanding and agree with the plan and Code Status.  Code Status full Time spent in minutes : 45  Yoshiaki Kreuser, Ulice Bold, MD

## 2014-08-23 NOTE — ED Provider Notes (Signed)
[facility] Emergency Department Provider Note    ____________________________________________  Time seen: 12:15 PM  I have reviewed the triage vital signs and the nursing notes.   HISTORY  Chief Complaint Headache   History somewhat limited by decreased responsiveness.    HPI Brenda Roman is a 81 y.o. female who presents with fatigue and generalized weakness and mild headache 3 days. Patient saw her primary physician on Friday for mild headache, he believed is related to URI cephalgia. Family reports patient continued to get weaker with increased fatigue over the weekend. Seen by PCP again today referred to ED. Patient denies headache in the ED, her primary complaint is severe fatigue. Family has noticed some change in patient's speech which was present upon awakening this morning. Of note patient is on CellCept for distant liver transplant as well as xarelto. Patient denies cough, does note mild dysuria. No abdominal pain. No neck pain. No neuro deficits     Past Medical History  Diagnosis Date  . Liver transplanted   . Macular degeneration   . Coronary artery disease     There are no active problems to display for this patient.   Past Surgical History  Procedure Laterality Date  . Coronary artery bypass graft      No current outpatient prescriptions on file.  Allergies Review of patient's allergies indicates no known allergies.  No family history on file.  Social History History  Substance Use Topics  . Smoking status: Never Smoker   . Smokeless tobacco: Not on file  . Alcohol Use: No    Review of Systems  Constitutional: Positive for fever and fatigue Eyes: Negative for visual changes. ENT: Negative for sore throat. Cardiovascular: Negative for chest pain. Respiratory: Negative for shortness of breath. Gastrointestinal: Negative for abdominal pain, vomiting and diarrhea. Genitourinary: Negative for  dysuria. Musculoskeletal: Negative for back pain. Skin: Negative for rash. Neurological: Did have headache but now resolved, negative for focal weakness or numbness.   10-point ROS otherwise negative.  ____________________________________________   PHYSICAL EXAM:  VITAL SIGNS: ED Triage Vitals  Enc Vitals Group     BP 08/23/14 1125 120/70 mmHg     Pulse Rate 08/23/14 1125 78     Resp 08/23/14 1125 16     Temp 08/23/14 1125 100.6 F (38.1 C)     Temp Source 08/23/14 1125 Oral     SpO2 08/23/14 1125 94 %     Weight 08/23/14 1125 135 lb (61.236 kg)     Height 08/23/14 1125 5\' 5"  (1.651 m)     Head Cir --      Peak Flow --      Pain Score --      Pain Loc --      Pain Edu? --      Excl. in [place]? --      Constitutional: Alert and oriented. Slightly decreased responsiveness Eyes: Conjunctivae are normal. PERRL. Normal extraocular movements. ENT   Head: Normocephalic and atraumatic.   Nose: No congestion/rhinnorhea.   Mouth/Throat: Mucous membranes are dry.   Neck: No stridor. Hematological/Lymphatic/Immunilogical: No cervical lymphadenopathy. Cardiovascular: Normal rate, regular rhythm. Normal and symmetric distal pulses are present in all extremities. No murmurs, rubs, or gallops. Respiratory: Normal respiratory effort without tachypnea nor retractions. Breath sounds are clear and equal bilaterally. No wheezes/rales/rhonchi. Gastrointestinal: Soft and nontender. No distention. No abdominal bruits. There is no CVA tenderness. Genitourinary: Deferred Musculoskeletal: Nontender with normal range of motion in all extremities. No joint effusions.  No lower extremity tenderness nor edema. Neurologic:  Normal speech and language. No gross focal neurologic deficits are appreciated. Speech is normal. No gait instability. Skin:  Skin is warm, dry and intact. No rash noted. Psychiatric: Mood and affect are normal. Speech is somewhat difficult to understand. Patient exhibits  appropriate insight and judgment.  ____________________________________________   EKG  None  ____________________________________________    RADIOLOGY  1 view chest x-ray no acute distress  ____________________________________________   PROCEDURES  Procedure(s) performed: None  Critical Care performed: No  ____________________________________________   INITIAL IMPRESSION / ASSESSMENT AND PLAN / ED COURSE  Pertinent labs & imaging results that were available during my care of the patient were reviewed by me and considered in my medical decision making (see chart for details).  Patient's presentation most consistent with a metabolic encephalopathy but certainly CVA is also possible. Considered meningitis but extremely unlikely given no neck pain no rash and long time course ----------------------------------------- 2:37 PM on 08/23/2014 -----------------------------------------  Thus far labs looking reassuring. Patient does have slight fever, unclear significance.  ----------------------------------------- 3:26 PM on 08/23/2014 -----------------------------------------  Urine chest x-ray unremarkable. No rash. CVA becoming more likely. Not a TPA candidate given symptoms present upon awakening. Will admit for further workup  ____________________________________________   FINAL CLINICAL IMPRESSION(S) / ED DIAGNOSES  Final diagnoses:  Metabolic encephalopathy     Lavonia Drafts, MD 08/23/14 [id number]

## 2014-08-23 NOTE — ED Notes (Signed)
Pt brought over from [facility], she reports that she developed a headache three days ago, today she started to become dizzy and has had trouble ambulating.

## 2014-08-23 NOTE — ED Notes (Signed)
Assisted to bathroom.  Waiting for room assignment.

## 2014-08-23 NOTE — ED Notes (Signed)
Admitting MD at bedside.

## 2014-08-23 NOTE — ED Notes (Signed)
Headache for 5 days.  was on cipro for uti.  Decreased loc now.  Sleeping all the time

## 2014-08-23 NOTE — ED Notes (Signed)
Vital signs stable. Patient failed swallow screen.

## 2014-08-24 ENCOUNTER — Inpatient Hospital Stay: Payer: Medicare Other

## 2014-08-24 ENCOUNTER — Inpatient Hospital Stay (HOSPITAL_COMMUNITY): Payer: Medicare Other

## 2014-08-24 DIAGNOSIS — R471 Dysarthria and anarthria: Secondary | ICD-10-CM

## 2014-08-24 MED ORDER — ONDANSETRON HCL 4 MG/2ML IJ SOLN
4.0000 mg | Freq: Four times a day (QID) | INTRAMUSCULAR | Status: DC | PRN
  Administered 2014-08-24: 4 mg via INTRAVENOUS
  Filled 2014-08-24: qty 2

## 2014-08-24 MED ORDER — EZETIMIBE 10 MG PO TABS
10.0000 mg | ORAL_TABLET | Freq: Every day | ORAL | Status: DC
  Administered 2014-08-24: 10 mg via ORAL
  Filled 2014-08-24 (×2): qty 1

## 2014-08-24 MED ORDER — MYCOPHENOLATE MOFETIL 250 MG PO CAPS
500.0000 mg | ORAL_CAPSULE | Freq: Two times a day (BID) | ORAL | Status: DC
  Administered 2014-08-24 – 2014-08-25 (×2): 500 mg via ORAL
  Filled 2014-08-24 (×4): qty 2

## 2014-08-24 MED ORDER — VITAMIN D3 25 MCG (1000 UT) PO CAPS
2.0000 | ORAL_CAPSULE | Freq: Every day | ORAL | Status: DC
  Administered 2014-08-24: 2000 [IU] via ORAL
  Filled 2014-08-24 (×2): qty 2

## 2014-08-24 MED ORDER — EZETIMIBE-SIMVASTATIN 10-20 MG PO TABS
1.0000 | ORAL_TABLET | Freq: Every day | ORAL | Status: DC
  Filled 2014-08-24: qty 1

## 2014-08-24 MED ORDER — ASPIRIN EC 81 MG PO TBEC
81.0000 mg | DELAYED_RELEASE_TABLET | Freq: Two times a day (BID) | ORAL | Status: DC
  Administered 2014-08-24 – 2014-08-25 (×3): 81 mg via ORAL
  Filled 2014-08-24 (×3): qty 1

## 2014-08-24 MED ORDER — SODIUM CHLORIDE 0.9 % IJ SOLN: 3.0000 mL | INTRAMUSCULAR | Status: DC | PRN

## 2014-08-24 MED ORDER — ZOLPIDEM TARTRATE 5 MG PO TABS
5.0000 mg | ORAL_TABLET | Freq: Every day | ORAL | Status: DC
  Administered 2014-08-24: 5 mg via ORAL
  Filled 2014-08-24: qty 1

## 2014-08-24 MED ORDER — OCUVITE PO TABS
1.0000 | ORAL_TABLET | Freq: Two times a day (BID) | ORAL | Status: DC
  Administered 2014-08-24 (×2): 1 via ORAL
  Filled 2014-08-24 (×4): qty 1

## 2014-08-24 MED ORDER — METOPROLOL SUCCINATE ER 50 MG PO TB24
50.0000 mg | ORAL_TABLET | Freq: Every day | ORAL | Status: DC
  Administered 2014-08-24: 50 mg via ORAL
  Filled 2014-08-24: qty 1

## 2014-08-24 MED ORDER — MAGNESIUM SULFATE 2 GM/50ML IV SOLN
2.0000 g | Freq: Once | INTRAVENOUS | Status: AC
  Administered 2014-08-24: 2 g via INTRAVENOUS
  Filled 2014-08-24 (×2): qty 50

## 2014-08-24 MED ORDER — CALCIUM CARBONATE ANTACID 500 MG PO CHEW
CHEWABLE_TABLET | ORAL | Status: AC
  Administered 2014-08-24: 200 mg
  Filled 2014-08-24: qty 1

## 2014-08-24 MED ORDER — PREDNISONE 10 MG PO TABS
5.0000 mg | ORAL_TABLET | Freq: Every day | ORAL | Status: DC
  Administered 2014-08-24 – 2014-08-25 (×2): 5 mg via ORAL
  Filled 2014-08-24: qty 2
  Filled 2014-08-24: qty 1

## 2014-08-24 MED ORDER — RIVAROXABAN 20 MG PO TABS
20.0000 mg | ORAL_TABLET | Freq: Every day | ORAL | Status: DC
  Administered 2014-08-24: 20 mg via ORAL
  Filled 2014-08-24: qty 1

## 2014-08-24 MED ORDER — GADOBENATE DIMEGLUMINE 529 MG/ML IV SOLN
15.0000 mL | Freq: Once | INTRAVENOUS | Status: AC | PRN
  Administered 2014-08-24: 12 mL via INTRAVENOUS

## 2014-08-24 MED ORDER — SIMVASTATIN 20 MG PO TABS
20.0000 mg | ORAL_TABLET | Freq: Every day | ORAL | Status: DC
  Administered 2014-08-24: 20 mg via ORAL
  Filled 2014-08-24: qty 1

## 2014-08-24 MED ORDER — CALCIUM CARBONATE 1250 (500 CA) MG PO TABS
1.0000 | ORAL_TABLET | Freq: Every day | ORAL | Status: DC
  Administered 2014-08-25: 500 mg via ORAL
  Filled 2014-08-24 (×2): qty 1

## 2014-08-24 MED ORDER — METOPROLOL SUCCINATE ER 100 MG PO TB24
100.0000 mg | ORAL_TABLET | Freq: Every morning | ORAL | Status: DC
  Administered 2014-08-24 – 2014-08-25 (×2): 100 mg via ORAL
  Filled 2014-08-24 (×2): qty 1

## 2014-08-24 MED ORDER — NITROGLYCERIN 0.4 MG SL SUBL: 0.4000 mg | SUBLINGUAL_TABLET | SUBLINGUAL | Status: DC | PRN

## 2014-08-24 NOTE — Discharge Summary (Addendum)
Brenda Roman, is a 81 y.o. female  DOB 09/15/1923  MRN [id number].  Admission date:  08/23/2014  Admitting Physician  Bettey Costa, MD  Discharge Date:  08/24/2014   Primary MD  Kirk Ruths., MD    Admission Diagnosis  CVA  Discharge Diagnosis TIA       Past Medical History  Diagnosis Date  . Liver transplanted   . Macular degeneration   . Coronary artery disease         History of present illness and  Hospital Course:     Kindly see H&P for history of present illness and admission details, please review complete Labs, Consult reports and Test reports for all details in brief  Brenda Roman is a 81 y.o. female with past medical history significant for ischemic heart disease, non-Q-wave MI, atrial fibrillation, temporal arteritis, liver transplant on CellCept and steroids, TIA who presents from her PCPs office after 1 week of weakness. Patient has been recently treated for urinary tract infection since that time patient's family has noted her to be increasingly lethargic and weak. She has also been complaining of a severe headache for the past 3 days. She saw Dr. Ginette Pitman this past Friday due to her ongoing weakness and headache. They felt that she was dehydrated and encourage her to drink plenty of fluids. They did recommend if she wasn't feeling better to go back to the doctor for follow-up. She presented today to her PCPs office with above issues. She was asked to come to the ER as she is severely weak and her family had noticed slurred speech this morning. My examination patient deathly has slurred speech and garbled speech. She has no other focal neurological deficits. She has a history of temporal arteritis and her ESR was recently checked which was normal    Consults obtained - Neurology  Major procedures  and Radiology Reports - PLEASE review detailed and final reports for all details, in brief -    Ct Head Wo Contrast  08/23/2014   CLINICAL DATA:  Headache for 5 days. Decreased consciousness. Slurred speech for 1 day. Gait unsteadiness for 48 hours.  EXAM: CT HEAD WITHOUT CONTRAST  TECHNIQUE: Contiguous axial images were obtained from the base of the skull through the vertex without intravenous contrast.  COMPARISON:  12/03/2013  FINDINGS: Sinuses/Soft tissues: Clear paranasal sinuses and mastoid air cells.  Intracranial: Expected cerebral volume loss for age. Moderate low density in the periventricular white matter likely related to small vessel disease. Minimal motion degradation.  No mass lesion, hemorrhage, hydrocephalus, acute infarct, intra-axial, or extra-axial fluid collection.  IMPRESSION: 1.  No acute intracranial abnormality. 2.  Cerebral atrophy and small vessel ischemic change.   Electronically Signed   By: Abigail Miyamoto M.D.   On: 08/23/2014 13:08   Mr Jeri Cos KK Contrast  08/24/2014     IMPRESSION: 1. No acute infarct. 2. Small, subacute to chronic left parietal cortical infarct. 3. Moderate chronic small vessel ischemic disease. 4. 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   Electronically Signed   By: Logan Bores   On: 08/24/2014 12:52   US Carotid Bilateral  08/24/2014    IMPRESSION: Mild atherosclerotic disease in the carotid arteries bilaterally, left side greater than right. Estimated degree of stenosis in the internal carotid arteries is less than 50% bilaterally.  Patent vertebral arteries bilaterally.   Electronically Signed   By: Markus Daft M.D.   On: 08/24/2014 12:57   Dg Chest Port 1 View  08/23/2014    IMPRESSION: No active disease.  Status post CABG again noted.   Electronically Signed   By: Lahoma Crocker M.D.   On: 08/23/2014 12:28    Micro Results   bllod cx negative    Hospital Course   1. TIA: Patient  was admitted with rule out CVA. Her MRI was negative for CVA. Her symptoms have resolved.She will continue on current anticoagulation as she is on Xarelto for atrial fibrillation and asa. Carotids showed no significant stenosis. PT recommends SNF, but she would like to go home with home health care. I spoke with NEUROLOGY on the phone. He has reviewed patient's MRI and doppler ultrasound. At this time, no further recommendations were made. She will continue with Xarelto.  2. History of atrial fibrillation: She will continue xarelto and metoprolol. 3. History of PBC and liver transplant: Patient's currently on CellCept and prednisone which she will continue. 4. Hyponatremia with dehydration: It appears the patient is no longer dehydrated. As are stable.   Discharge Condition: stable   Follow UP  Follow-up Information    Follow up with Kirk Ruths., MD. Go on 09/01/2014.   Specialty:  Internal Medicine   Why:  Time at  10:00 am.   Contact information:   [place] [address] [phone]         Discharge Instructions  and  Discharge Medications     Discharge Instructions    Call MD for:  extreme fatigue    Complete by:  As directed      Call MD for:  persistant dizziness or light-headedness    Complete by:  As directed      Diet - low sodium heart healthy    Complete by:  As directed      Discharge instructions    Complete by:  As directed   May resume daily activities.     Increase activity slowly    Complete by:  As directed             Medication List    TAKE these medications        aspirin EC 81 MG tablet  Take 81 mg by mouth 2 (two) times daily.     beta carotene w/minerals tablet  Take 1 tablet by mouth 2 (two) times daily.     CALCIUM CARBONATE PO  Take 1 tablet by mouth daily.     ezetimibe-simvastatin 10-20 MG per tablet  Commonly known as:  VYTORIN  Take 1 tablet by mouth at bedtime.     metoprolol succinate 100 MG  24 hr tablet  Commonly known as:  TOPROL-XL  Take 100 mg by mouth every morning.     metoprolol succinate 50 MG 24 hr tablet  Commonly known as:  TOPROL-XL  Take 50 mg by mouth at bedtime.     metoprolol tartrate 25 MG tablet  Commonly known as:  LOPRESSOR  Take 25 mg by mouth 2 (two)  times daily as needed (for palpitations).     mycophenolate 500 MG tablet  Commonly known as:  CELLCEPT  Take 500 mg by mouth every 12 (twelve) hours.     nitroGLYCERIN 0.4 MG SL tablet  Commonly known as:  NITROSTAT  Place 0.4 mg under the tongue every 5 (five) minutes as needed for chest pain.     predniSONE 5 MG tablet  Commonly known as:  DELTASONE  Take 5 mg by mouth daily.     rivaroxaban 20 MG Tabs tablet  Commonly known as:  XARELTO  Take 20 mg by mouth daily with supper.     Vitamin D3 1000 UNITS Caps  Take 2 capsules by mouth daily.     zolpidem 5 MG tablet  Commonly known as:  AMBIEN  Take 5 mg by mouth at bedtime.          Diet and Activity recommendation: See Discharge Instructions above         Today   Subjective:   Kieanna Rollo is feeling well this am. Family at bedside with several questions which were answered. She was nauseas this am  Objective:   Blood pressure 146/61, pulse 85, temperature 98.5 F (36.9 C), temperature source Oral, resp. rate 18, height 5' 5"  (1.651 m), weight 60.827 kg (134 lb 1.6 oz), SpO2 97 %.    Exam Awake Alert, Oriented x 3, No new F.N deficits, Normal affect [place].AT,PERRAL Supple Neck,No JVD, No cervical lymphadenopathy appriciated.  Symmetrical Chest wall movement, Good air movement bilaterally, CTAB RRR,No Gallops,Rubs or new Murmurs, No Parasternal Heave +ve B.Sounds, Abd Soft, Non tender, No organomegaly appriciated, No rebound -guarding or rigidity. No Cyanosis, Clubbing or edema, No new Rash or bruise CN 2-12 are intact. No speech issues no sensory issues.  Data Review   CBC w Diff:  Lab Results  Component Value  Date   WBC 6.3 08/23/2014   WBC 6.6 12/03/2013   HGB 10.5* 08/23/2014   HGB 11.1* 12/03/2013   HCT 33.8* 08/23/2014   HCT 35.6 12/03/2013   PLT 298 08/23/2014   PLT 292 12/03/2013   LYMPHOPCT 16% 08/23/2014   LYMPHOPCT 26.8 12/27/2012   MONOPCT 10% 08/23/2014   MONOPCT 10.8 12/27/2012   EOSPCT 0% 08/23/2014   EOSPCT 2.2 12/27/2012   BASOPCT 1% 08/23/2014   BASOPCT 0.8 12/27/2012    CMP:  Lab Results  Component Value Date   NA 129* 08/23/2014   NA 137 12/03/2013   K 3.7 08/23/2014   K 4.2 12/03/2013   CL 94* 08/23/2014   CL 101 12/03/2013   CO2 27 08/23/2014   CO2 31 12/03/2013   BUN 13 08/23/2014   BUN 22* 12/03/2013   CREATININE 0.84 08/23/2014   CREATININE 1.16 12/03/2013   PROT 7.0 08/23/2014   PROT 7.3 12/03/2013   ALBUMIN 4.0 08/23/2014   ALBUMIN 3.8 12/03/2013   BILITOT 0.5 08/23/2014   ALKPHOS 58 08/23/2014   ALKPHOS 92 12/03/2013   AST 22 08/23/2014   AST 24 12/03/2013   ALT 17 08/23/2014   ALT 32 12/03/2013  .   Total Time in preparing paper work, data evaluation and todays exam - 35 minutes  Shreyas Piatkowski, MD

## 2014-08-24 NOTE — Progress Notes (Signed)
Subjective: 81 y.o. female with past medical history significant for ischemic heart disease, non-Q-wave MI, atrial fibrillation, temporal arteritis, liver transplant on CellCept and steroids, TIA who presents from her PCPs office after 1 week of weakness. Patient has been recently treated for urinary tract infection since that time patient's family has noted her to be increasingly lethargic and weak. She has also been complaining of a severe headache for the past 3 days. She saw Dr. Ginette Pitman this past Friday due to her ongoing weakness and headache.   As per husband since Saturday pt has been having decreased appetite,  Increased weakness and slurry speech   - - Symptoms improved. Mental status close to baseline just complaining of generalized weakness.     Current vital signs: BP 146/61 mmHg  Pulse 85  Temp(Src) 98.5 F (36.9 C) (Oral)  Resp 18  Ht 5' 5"  (1.651 m)  Wt 60.827 kg (134 lb 1.6 oz)  BMI 22.32 kg/m2  SpO2 97% Vital signs in last 24 hours: Temp:  [98.5 F (36.9 C)-100.2 F (37.9 C)] 98.5 F (36.9 C) (05/03 1202) Pulse Rate:  [82-87] 85 (05/03 1202) Resp:  [18-20] 18 (05/03 1202) BP: (128-156)/(57-81) 146/61 mmHg (05/03 1202) SpO2:  [92 %-97 %] 97 % (05/03 1202) Weight:  [60.827 kg (134 lb 1.6 oz)-61.1 kg (134 lb 11.2 oz)] 60.827 kg (134 lb 1.6 oz) (05/03 0439)  Intake/Output from previous day: 05/02 0701 - 05/03 0700 In: 1065 [I.V.:1065] Out: 1000 [Urine:1000] Intake/Output this shift: Total I/O In: 881.7 [I.V.:881.7] Out: 1000 [Urine:1000] Nutritional status: Diet regular Room service appropriate?: Yes; Fluid consistency:: Thin Diet - low sodium heart healthy   Lab Results: Results for orders placed or performed during the hospital encounter of 08/23/14 (from the past 48 hour(s))  Urinalysis complete, with microscopic [facility])     Status: Abnormal   Collection Time: 08/23/14 10:32 AM  Result Value Ref Range   Color, Urine YELLOW (A) YELLOW   APPearance CLEAR (A)  CLEAR   Glucose, UA NEGATIVE NEGATIVE mg/dL   Bilirubin Urine NEGATIVE NEGATIVE   Ketones, ur TRACE (A) NEGATIVE mg/dL   Specific Gravity, Urine 1.011 1.005 - 1.030   Hgb urine dipstick NEGATIVE NEGATIVE   pH 7.0 5.0 - 8.0   Protein, ur NEGATIVE NEGATIVE mg/dL   Nitrite NEGATIVE NEGATIVE   Leukocytes, UA NEGATIVE NEGATIVE   RBC / HPF 6-30 0 - 5 RBC/hpf   WBC, UA 0-5 0 - 5 WBC/hpf   Bacteria, UA NONE SEEN NONE SEEN   Squamous Epithelial / LPF NONE SEEN NONE SEEN   Mucous PRESENT   Comprehensive metabolic panel     Status: Abnormal   Collection Time: 08/23/14 11:35 AM  Result Value Ref Range   Sodium 129 (L) 135 - 145 mmol/L   Potassium 3.7 3.5 - 5.1 mmol/L   Chloride 94 (L) 101 - 111 mmol/L   CO2 27 22 - 32 mmol/L   Glucose, Bld 97 65 - 99 mg/dL   BUN 13 6 - 20 mg/dL   Creatinine, Ser 0.84 0.44 - 1.00 mg/dL   Calcium 8.9 8.9 - 10.3 mg/dL   Total Protein 7.0 6.5 - 8.1 g/dL   Albumin 4.0 3.5 - 5.0 g/dL   AST 22 15 - 41 U/L   ALT 17 14 - 54 U/L   Alkaline Phosphatase 58 38 - 126 U/L   Total Bilirubin 0.5 0.3 - 1.2 mg/dL   GFR calc non Af Amer >60 >60 mL/min   GFR calc Af  Amer >60 >60 mL/min    Comment: (NOTE) The eGFR has been calculated using the CKD EPI equation. This calculation has not been validated in all clinical situations. eGFR's persistently <90 mL/min signify possible Chronic Kidney Disease.    Anion gap 8 5 - 15  CBC WITH DIFFERENTIAL     Status: Abnormal   Collection Time: 08/23/14 11:35 AM  Result Value Ref Range   WBC 6.3 3.6 - 11.0 K/uL   RBC 4.09 3.80 - 5.20 MIL/uL   Hemoglobin 10.5 (L) 12.0 - 16.0 g/dL   HCT 33.8 (L) 35.0 - 47.0 %   MCV 82.6 80.0 - 100.0 fL   MCH 25.7 (L) 26.0 - 34.0 pg   MCHC 31.2 (L) 32.0 - 36.0 g/dL   RDW 15.7 (H) 11.5 - 14.5 %   Platelets 298 150 - 440 K/uL   Neutrophils Relative % 73% %   Neutro Abs 4.6 1.4 - 6.5 K/uL   Lymphocytes Relative 16% %   Lymphs Abs 1.0 1.0 - 3.6 K/uL   Monocytes Relative 10% %   Monocytes  Absolute 0.6 0.2 - 0.9 K/uL   Eosinophils Relative 0% %   Eosinophils Absolute 0.0 0 - 0.7 K/uL   Basophils Relative 1% %   Basophils Absolute 0.0 0 - 0.1 K/uL  Culture, blood (routine x 2)     Status: None (Preliminary result)   Collection Time: 08/23/14 11:35 AM  Result Value Ref Range   Specimen Description BLOOD    Special Requests BLOOD    Culture NO GROWTH < 24 HOURS    Report Status PENDING   Culture, blood (routine x 2)     Status: None (Preliminary result)   Collection Time: 08/23/14  4:15 PM  Result Value Ref Range   Specimen Description BLOOD    Special Requests BLOOD    Culture NO GROWTH < 24 HOURS    Report Status PENDING   Lipid panel     Status: Abnormal   Collection Time: 08/23/14  6:19 PM  Result Value Ref Range   Cholesterol 157 0 - 200 mg/dL   Triglycerides 99 <150 mg/dL   HDL 30 (L) >40 mg/dL   Total CHOL/HDL Ratio 5.2 RATIO   VLDL 20 0 - 40 mg/dL   LDL Cholesterol 107 (H) 0 - 99 mg/dL    Comment:        Total Cholesterol/HDL:CHD Risk Coronary Heart Disease Risk Table                     Men   Women  1/2 Average Risk   3.4   3.3  Average Risk       5.0   4.4  2 X Average Risk   9.6   7.1  3 X Average Risk  23.4   11.0        Use the calculated Patient Ratio above and the CHD Risk Table to determine the patient's CHD Risk.        ATP III CLASSIFICATION (LDL):  <100     mg/dL   Optimal  100-129  mg/dL   Near or Above                    Optimal  130-159  mg/dL   Borderline  160-189  mg/dL   High  >190     mg/dL   Very High     Recent Results (from the past 240 hour(s))  Culture, blood (routine x  2)     Status: None (Preliminary result)   Collection Time: 08/23/14 11:35 AM  Result Value Ref Range Status   Specimen Description BLOOD  Final   Special Requests BLOOD  Final   Culture NO GROWTH < 24 HOURS  Final   Report Status PENDING  Incomplete  Culture, blood (routine x 2)     Status: None (Preliminary result)   Collection Time: 08/23/14   4:15 PM  Result Value Ref Range Status   Specimen Description BLOOD  Final   Special Requests BLOOD  Final   Culture NO GROWTH < 24 HOURS  Final   Report Status PENDING  Incomplete    Lipid Panel  Recent Labs  08/23/14 1819  CHOL 157  TRIG 99  HDL 30*  CHOLHDL 5.2  VLDL 20  LDLCALC 107*    Studies/Results: Ct Head Wo Contrast  08/23/2014   CLINICAL DATA:  Headache for 5 days. Decreased consciousness. Slurred speech for 1 day. Gait unsteadiness for 48 hours.  EXAM: CT HEAD WITHOUT CONTRAST  TECHNIQUE: Contiguous axial images were obtained from the base of the skull through the vertex without intravenous contrast.  COMPARISON:  12/03/2013  FINDINGS: Sinuses/Soft tissues: Clear paranasal sinuses and mastoid air cells.  Intracranial: Expected cerebral volume loss for age. Moderate low density in the periventricular white matter likely related to small vessel disease. Minimal motion degradation.  No mass lesion, hemorrhage, hydrocephalus, acute infarct, intra-axial, or extra-axial fluid collection.  IMPRESSION: 1.  No acute intracranial abnormality. 2.  Cerebral atrophy and small vessel ischemic change.   Electronically Signed   By: Abigail Miyamoto M.D.   On: 08/23/2014 13:08   Mr Jeri Cos ZD Contrast  08/24/2014   CLINICAL DATA:  Headache 3 days ago. Dizziness and difficulty ambulating. Disorientation 2 days ago.  EXAM: MRI HEAD WITHOUT AND WITH CONTRAST  TECHNIQUE: Multiplanar, multiecho pulse sequences of the brain and surrounding structures were obtained without and with intravenous contrast.  CONTRAST:  97m MULTIHANCE GADOBENATE DIMEGLUMINE 529 MG/ML IV SOLN  COMPARISON:  Head CT 08/23/2014  FINDINGS: Images are mildly degraded by motion artifact.  Limited visualization of the upper cervical spine demonstrates no acute infarct is identified. Small focus of cortical increased diffusion-weighted signal in the left parietal lobe reflects T2 shine through related to a small, subacute to chronic  infarct. There is no midline shift or extra-axial fluid collection. There is mild-to-moderate cerebral atrophy. Patchy and confluent T2 hyperintensities in the subcortical and deep cerebral white matter are nonspecific but compatible with moderate chronic small vessel ischemic disease. A small, chronic infarct is noted in the right cerebellum. There is a 7 mm T2 hyperintense, enhancing lesion in the inferior right frontal lobe cortex (series 13, image 28 and series 14, image 8) with associated susceptibility artifact. No abnormal enhancement is seen elsewhere.  Prior right cataract extraction is noted. Mild mucosal thickening is noted in the right maxillary sinus and bilateral ethmoid air cells. Mastoid air cells are clear. Major intracranial vascular flow voids are preserved.  IMPRESSION: 1. No acute infarct. 2. Small, subacute to chronic left parietal cortical infarct. 3. Moderate chronic small vessel ischemic disease. 4. 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   Electronically Signed   By: ALogan Bores  On: 08/24/2014 12:52   [place]Carotid Bilateral  08/24/2014   CLINICAL DATA:  History of headaches. Dizziness and trouble ambulating.  EXAM: BILATERAL CAROTID DUPLEX ULTRASOUND  TECHNIQUE:  Gray scale imaging, color Doppler and duplex ultrasound were performed of bilateral carotid and vertebral arteries in the neck.  COMPARISON:  None.  FINDINGS: Criteria: Quantification of carotid stenosis is based on velocity parameters that correlate the residual internal carotid diameter with NASCET-based stenosis levels, using the diameter of the distal internal carotid lumen as the denominator for stenosis measurement.  The following velocity measurements were obtained:  RIGHT  ICA:  62 cm/sec  CCA:  64 cm/sec  SYSTOLIC ICA/CCA RATIO:  1.0  DIASTOLIC ICA/CCA RATIO:  0.8  ECA:  73 cm/sec  LEFT  ICA:  93 cm/sec  CCA:  73 cm/sec  SYSTOLIC ICA/CCA RATIO:  1.3   DIASTOLIC ICA/CCA RATIO:  2.0  ECA:  72 cm/sec  RIGHT CAROTID ARTERY: Small amount of plaque at the carotid bulb and proximal internal carotid artery. No significant stenosis in the right carotid arteries.  RIGHT VERTEBRAL ARTERY: Antegrade flow and normal waveform in the right vertebral artery.  LEFT CAROTID ARTERY: Small amount of echogenic plaque at the carotid bulb and proximal internal carotid artery. No significant stenosis in left internal carotid artery. Normal waveforms and velocities in left internal carotid artery.  LEFT VERTEBRAL ARTERY: Antegrade flow and normal waveform in the left vertebral artery.  IMPRESSION: Mild atherosclerotic disease in the carotid arteries bilaterally, left side greater than right. Estimated degree of stenosis in the internal carotid arteries is less than 50% bilaterally.  Patent vertebral arteries bilaterally.   Electronically Signed   By: Markus Daft M.D.   On: 08/24/2014 12:57   Dg Chest Port 1 View  08/23/2014   CLINICAL DATA:  Headache for 3 days, dizziness, chest pain, congestion  EXAM: PORTABLE CHEST - 1 VIEW  COMPARISON:  12/26/2012  FINDINGS: Cardiomediastinal silhouette is stable. Again noted status post CABG. No acute infiltrate or pleural effusion. No pulmonary edema. Stable left basilar scarring.  IMPRESSION: No active disease.  Status post CABG again noted.   Electronically Signed   By: Lahoma Crocker M.D.   On: 08/23/2014 12:28    Medications: I have reviewed the patient's current medications.  Assessment/Plan:  @HMPROB @    LOS: 1 day   @LRSIGN @ 08/24/2014  4:20 PM   Subjective:   Objective: Current vital signs: BP 146/61 mmHg  Pulse 85  Temp(Src) 98.5 F (36.9 C) (Oral)  Resp 18  Ht 5' 5"  (1.651 m)  Wt 60.827 kg (134 lb 1.6 oz)  BMI 22.32 kg/m2  SpO2 97% Vital signs in last 24 hours: Temp:  [98.5 F (36.9 C)-100.2 F (37.9 C)] 98.5 F (36.9 C) (05/03 1202) Pulse Rate:  [82-87] 85 (05/03 1202) Resp:  [18-20] 18 (05/03 1202) BP:  (128-156)/(57-81) 146/61 mmHg (05/03 1202) SpO2:  [92 %-97 %] 97 % (05/03 1202) Weight:  [60.827 kg (134 lb 1.6 oz)-61.1 kg (134 lb 11.2 oz)] 60.827 kg (134 lb 1.6 oz) (05/03 0439)  Intake/Output from previous day: 05/02 0701 - 05/03 0700 In: 1065 [I.V.:1065] Out: 1000 [Urine:1000] Intake/Output this shift: Total I/O In: 881.7 [I.V.:881.7] Out: 1000 [Urine:1000] Nutritional status: Diet regular Room service appropriate?: Yes; Fluid consistency:: Thin Diet - low sodium heart healthy  Neurologic Exam:   Lab Results: Results for orders placed or performed during the hospital encounter of 08/23/14 (from the past 48 hour(s))  Urinalysis complete, with microscopic [facility])     Status: Abnormal   Collection Time: 08/23/14 10:32 AM  Result Value Ref Range   Color, Urine YELLOW (A) YELLOW   APPearance CLEAR (A) CLEAR  Glucose, UA NEGATIVE NEGATIVE mg/dL   Bilirubin Urine NEGATIVE NEGATIVE   Ketones, ur TRACE (A) NEGATIVE mg/dL   Specific Gravity, Urine 1.011 1.005 - 1.030   Hgb urine dipstick NEGATIVE NEGATIVE   pH 7.0 5.0 - 8.0   Protein, ur NEGATIVE NEGATIVE mg/dL   Nitrite NEGATIVE NEGATIVE   Leukocytes, UA NEGATIVE NEGATIVE   RBC / HPF 6-30 0 - 5 RBC/hpf   WBC, UA 0-5 0 - 5 WBC/hpf   Bacteria, UA NONE SEEN NONE SEEN   Squamous Epithelial / LPF NONE SEEN NONE SEEN   Mucous PRESENT   Comprehensive metabolic panel     Status: Abnormal   Collection Time: 08/23/14 11:35 AM  Result Value Ref Range   Sodium 129 (L) 135 - 145 mmol/L   Potassium 3.7 3.5 - 5.1 mmol/L   Chloride 94 (L) 101 - 111 mmol/L   CO2 27 22 - 32 mmol/L   Glucose, Bld 97 65 - 99 mg/dL   BUN 13 6 - 20 mg/dL   Creatinine, Ser 0.84 0.44 - 1.00 mg/dL   Calcium 8.9 8.9 - 10.3 mg/dL   Total Protein 7.0 6.5 - 8.1 g/dL   Albumin 4.0 3.5 - 5.0 g/dL   AST 22 15 - 41 U/L   ALT 17 14 - 54 U/L   Alkaline Phosphatase 58 38 - 126 U/L   Total Bilirubin 0.5 0.3 - 1.2 mg/dL   GFR calc non Af Amer >60 >60 mL/min   GFR calc  Af Amer >60 >60 mL/min    Comment: (NOTE) The eGFR has been calculated using the CKD EPI equation. This calculation has not been validated in all clinical situations. eGFR's persistently <90 mL/min signify possible Chronic Kidney Disease.    Anion gap 8 5 - 15  CBC WITH DIFFERENTIAL     Status: Abnormal   Collection Time: 08/23/14 11:35 AM  Result Value Ref Range   WBC 6.3 3.6 - 11.0 K/uL   RBC 4.09 3.80 - 5.20 MIL/uL   Hemoglobin 10.5 (L) 12.0 - 16.0 g/dL   HCT 33.8 (L) 35.0 - 47.0 %   MCV 82.6 80.0 - 100.0 fL   MCH 25.7 (L) 26.0 - 34.0 pg   MCHC 31.2 (L) 32.0 - 36.0 g/dL   RDW 15.7 (H) 11.5 - 14.5 %   Platelets 298 150 - 440 K/uL   Neutrophils Relative % 73% %   Neutro Abs 4.6 1.4 - 6.5 K/uL   Lymphocytes Relative 16% %   Lymphs Abs 1.0 1.0 - 3.6 K/uL   Monocytes Relative 10% %   Monocytes Absolute 0.6 0.2 - 0.9 K/uL   Eosinophils Relative 0% %   Eosinophils Absolute 0.0 0 - 0.7 K/uL   Basophils Relative 1% %   Basophils Absolute 0.0 0 - 0.1 K/uL  Culture, blood (routine x 2)     Status: None (Preliminary result)   Collection Time: 08/23/14 11:35 AM  Result Value Ref Range   Specimen Description BLOOD    Special Requests BLOOD    Culture NO GROWTH < 24 HOURS    Report Status PENDING   Culture, blood (routine x 2)     Status: None (Preliminary result)   Collection Time: 08/23/14  4:15 PM  Result Value Ref Range   Specimen Description BLOOD    Special Requests BLOOD    Culture NO GROWTH < 24 HOURS    Report Status PENDING   Lipid panel     Status: Abnormal   Collection  Time: 08/23/14  6:19 PM  Result Value Ref Range   Cholesterol 157 0 - 200 mg/dL   Triglycerides 99 <150 mg/dL   HDL 30 (L) >40 mg/dL   Total CHOL/HDL Ratio 5.2 RATIO   VLDL 20 0 - 40 mg/dL   LDL Cholesterol 107 (H) 0 - 99 mg/dL    Comment:        Total Cholesterol/HDL:CHD Risk Coronary Heart Disease Risk Table                     Men   Women  1/2 Average Risk   3.4   3.3  Average Risk       5.0    4.4  2 X Average Risk   9.6   7.1  3 X Average Risk  23.4   11.0        Use the calculated Patient Ratio above and the CHD Risk Table to determine the patient's CHD Risk.        ATP III CLASSIFICATION (LDL):  <100     mg/dL   Optimal  100-129  mg/dL   Near or Above                    Optimal  130-159  mg/dL   Borderline  160-189  mg/dL   High  >190     mg/dL   Very High     Recent Results (from the past 240 hour(s))  Culture, blood (routine x 2)     Status: None (Preliminary result)   Collection Time: 08/23/14 11:35 AM  Result Value Ref Range Status   Specimen Description BLOOD  Final   Special Requests BLOOD  Final   Culture NO GROWTH < 24 HOURS  Final   Report Status PENDING  Incomplete  Culture, blood (routine x 2)     Status: None (Preliminary result)   Collection Time: 08/23/14  4:15 PM  Result Value Ref Range Status   Specimen Description BLOOD  Final   Special Requests BLOOD  Final   Culture NO GROWTH < 24 HOURS  Final   Report Status PENDING  Incomplete    Lipid Panel  Recent Labs  08/23/14 1819  CHOL 157  TRIG 99  HDL 30*  CHOLHDL 5.2  VLDL 20  LDLCALC 107*    Studies/Results: Ct Head Wo Contrast  08/23/2014   CLINICAL DATA:  Headache for 5 days. Decreased consciousness. Slurred speech for 1 day. Gait unsteadiness for 48 hours.  EXAM: CT HEAD WITHOUT CONTRAST  TECHNIQUE: Contiguous axial images were obtained from the base of the skull through the vertex without intravenous contrast.  COMPARISON:  12/03/2013  FINDINGS: Sinuses/Soft tissues: Clear paranasal sinuses and mastoid air cells.  Intracranial: Expected cerebral volume loss for age. Moderate low density in the periventricular white matter likely related to small vessel disease. Minimal motion degradation.  No mass lesion, hemorrhage, hydrocephalus, acute infarct, intra-axial, or extra-axial fluid collection.  IMPRESSION: 1.  No acute intracranial abnormality. 2.  Cerebral atrophy and small vessel  ischemic change.   Electronically Signed   By: Abigail Miyamoto M.D.   On: 08/23/2014 13:08   Mr Jeri Cos VQ Contrast  08/24/2014   CLINICAL DATA:  Headache 3 days ago. Dizziness and difficulty ambulating. Disorientation 2 days ago.  EXAM: MRI HEAD WITHOUT AND WITH CONTRAST  TECHNIQUE: Multiplanar, multiecho pulse sequences of the brain and surrounding structures were obtained without and with intravenous contrast.  CONTRAST:  32m  MULTIHANCE GADOBENATE DIMEGLUMINE 529 MG/ML IV SOLN  COMPARISON:  Head CT 08/23/2014  FINDINGS: Images are mildly degraded by motion artifact.  Limited visualization of the upper cervical spine demonstrates no acute infarct is identified. Small focus of cortical increased diffusion-weighted signal in the left parietal lobe reflects T2 shine through related to a small, subacute to chronic infarct. There is no midline shift or extra-axial fluid collection. There is mild-to-moderate cerebral atrophy. Patchy and confluent T2 hyperintensities in the subcortical and deep cerebral white matter are nonspecific but compatible with moderate chronic small vessel ischemic disease. A small, chronic infarct is noted in the right cerebellum. There is a 7 mm T2 hyperintense, enhancing lesion in the inferior right frontal lobe cortex (series 13, image 28 and series 14, image 8) with associated susceptibility artifact. No abnormal enhancement is seen elsewhere.  Prior right cataract extraction is noted. Mild mucosal thickening is noted in the right maxillary sinus and bilateral ethmoid air cells. Mastoid air cells are clear. Major intracranial vascular flow voids are preserved.  IMPRESSION: 1. No acute infarct. 2. Small, subacute to chronic left parietal cortical infarct. 3. Moderate chronic small vessel ischemic disease. 4. 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   Electronically Signed   By: Logan Bores   On: 08/24/2014 12:52   US  Carotid Bilateral  08/24/2014   CLINICAL DATA:  History of headaches. Dizziness and trouble ambulating.  EXAM: BILATERAL CAROTID DUPLEX ULTRASOUND  TECHNIQUE: Pearline Cables scale imaging, color Doppler and duplex ultrasound were performed of bilateral carotid and vertebral arteries in the neck.  COMPARISON:  None.  FINDINGS: Criteria: Quantification of carotid stenosis is based on velocity parameters that correlate the residual internal carotid diameter with NASCET-based stenosis levels, using the diameter of the distal internal carotid lumen as the denominator for stenosis measurement.  The following velocity measurements were obtained:  RIGHT  ICA:  62 cm/sec  CCA:  64 cm/sec  SYSTOLIC ICA/CCA RATIO:  1.0  DIASTOLIC ICA/CCA RATIO:  0.8  ECA:  73 cm/sec  LEFT  ICA:  93 cm/sec  CCA:  73 cm/sec  SYSTOLIC ICA/CCA RATIO:  1.3  DIASTOLIC ICA/CCA RATIO:  2.0  ECA:  72 cm/sec  RIGHT CAROTID ARTERY: Small amount of plaque at the carotid bulb and proximal internal carotid artery. No significant stenosis in the right carotid arteries.  RIGHT VERTEBRAL ARTERY: Antegrade flow and normal waveform in the right vertebral artery.  LEFT CAROTID ARTERY: Small amount of echogenic plaque at the carotid bulb and proximal internal carotid artery. No significant stenosis in left internal carotid artery. Normal waveforms and velocities in left internal carotid artery.  LEFT VERTEBRAL ARTERY: Antegrade flow and normal waveform in the left vertebral artery.  IMPRESSION: Mild atherosclerotic disease in the carotid arteries bilaterally, left side greater than right. Estimated degree of stenosis in the internal carotid arteries is less than 50% bilaterally.  Patent vertebral arteries bilaterally.   Electronically Signed   By: Markus Daft M.D.   On: 08/24/2014 12:57   Dg Chest Port 1 View  08/23/2014   CLINICAL DATA:  Headache for 3 days, dizziness, chest pain, congestion  EXAM: PORTABLE CHEST - 1 VIEW  COMPARISON:  12/26/2012  FINDINGS:  Cardiomediastinal silhouette is stable. Again noted status post CABG. No acute infiltrate or pleural effusion. No pulmonary edema. Stable left basilar scarring.  IMPRESSION: No active disease.  Status post CABG again noted.   Electronically Signed   By: Julien Girt  Pop M.D.   On: 08/23/2014 12:28    Medications: I have reviewed the patient's current medications.  Neurological exam: Pt is awake, following commands.  Has generalized weakness EOM intact  CN's intact  No facial asymetry.    Assessment/Plan: 81 y/o female with hx of A-fib on xarelto admitted with generalized weakness and slurry speech.  Speech is back to baseline, but pt still has generalized weakness.   S/p phone discussion with pt's husband and daughter.  They agree that pt has improved significantly.  Imaging has been described to them without any acute findings on MRI.  They would prefer if pt would  Stay in hospital for one more day and would be d/c home tomorrow with home PT as they don't want the patient to go to rehab.   Please con't xarelto and ASA. Do not think this is vascular related but likely combination of  UTI and mostly dehydration.    Please call with questions.  Natalio Salois    LOS: 1 day   @LRSIGN @ 08/24/2014  4:20 PM

## 2014-08-24 NOTE — Care Management (Signed)
Hospital bed will be delivered to patient's home this day

## 2014-08-24 NOTE — Evaluation (Signed)
Physical Therapy Evaluation Patient Details Name: Brenda Roman MRN: [id number] DOB: Jun 06, 1923 Today's Date: 08/24/2014   History of Present Illness  presented to ER secondary to severe headache X3 days with generalized weakness and slurred speech; admitted for CVA work-up.  Head CT negative; MRI pending.  Currently NPO, awaiting formal speech/swallow eval.  Clinical Impression  Upon evaluation, patient is alert and oriented to basic information, follows simple commands; HOH.  Strength and ROM grossly WFL and symmetrical, but globally weak and deconditioned.  Bilat UEs moderately tremulous with mild dysmetria noted L UE, new onset per family.  Frequent L lateral LOB in unsupported sitting/standing requiring min/mod assist from therapist to correct and prevent fall.  Requires min assist for bed mobility; min/mod assist for sit/stand, basic transfers and bed/chair with RW.  Poor balance and high fall risk.  Rather impulsive, attempting to standing prior to cuing and attempting to sit prior to full alignment with seating surface. Would benefit from skilled PT to address above deficits and promote optimal return to PLOF; recommend transition to STR upon discharge from acute hospitalization.  Patient/family aware and in agreement with recommendations.    Follow Up Recommendations SNF    Equipment Recommendations  Rolling walker with 5" wheels;3in1 (PT)    Recommendations for Other Services       Precautions / Restrictions Precautions Precautions: Fall Precaution Comments: NPO Restrictions Weight Bearing Restrictions: No      Mobility  Bed Mobility Overal bed mobility: Needs Assistance Bed Mobility: Supine to Sit     Supine to sit: Min assist     General bed mobility comments: transition towards L; assist for truncal elevation.  Transfers Overall transfer level: Needs assistance Equipment used: Rolling walker (2 wheeled) Transfers: Sit to/from [facility]    Stand pivot transfers: Mod assist;Min assist       General transfer comment: forward flexed posture, assist for RW management and dynamic balance.  Excessive L lateral lean.  Fatigues quickly, poor balance.  Often attempts to sit prior to full alignment with seating surface.  Unsafe to complete without RW and +1 at this time.  Ambulation/Gait Ambulation/Gait assistance: Mod assist;Min assist Ambulation Distance (Feet): 5 Feet Assistive device: Rolling walker (2 wheeled)       General Gait Details: short, shuffling steps; L lateral lean requiring constant assist from therapist for balance.  Poor balance; high fall risk.  Stairs            Wheelchair Mobility    Modified Rankin (Stroke Patients Only)       Balance Overall balance assessment: Needs assistance Sitting-balance support: Single extremity supported Sitting balance-Leahy Scale: Fair Sitting balance - Comments: excessive L posterior/lateral lean with unsupported sitting; min/mod assist for correction. Postural control: Left lateral lean;Posterior lean Standing balance support: Bilateral upper extremity supported Standing balance-Leahy Scale: Poor Standing balance comment: unsafe to attempt without RW and +1 min/mod assist                             Pertinent Vitals/Pain Pain Assessment: No/denies pain    Home Living Family/patient expects to be discharged to:: Private residence Living Arrangements: Spouse/significant other Available Help at Discharge: Family Type of Home: House Home Access: Stairs to enter Entrance Stairs-Rails: Right Entrance Stairs-Number of Steps: 6 Home Layout: Able to live on main level with bedroom/bathroom (main level with basement; no essential needs in basement) Home Equipment: None      Prior Function Level  of Independence: Independent (progressive decline over recent week prior to admission)               Hand Dominance        Extremity/Trunk  Assessment   Upper Extremity Assessment: Generalized weakness (ROM grossly WFL and symmetrical; strength at least 3+ to 4-/5 bilat.  Noted tremor at rest and with activity, R > L (new per family).   Denies paresthesia.  Mild dysmetria L > R.)           Lower Extremity Assessment: Generalized weakness (ROM grossly WFL and symmetrical; strength at least 3+ to 4-/5 bilat.  Denies paresthesia.)      Cervical / Trunk Assessment:  (forward head, rounded shoulders; excessive posterior trunk lean)  Communication   Communication:  (minimal slurring; no apparent word-finding difficulties)  Cognition Arousal/Alertness: Awake/alert Behavior During Therapy: WFL for tasks assessed/performed Overall Cognitive Status: Within Functional Limits for tasks assessed                      General Comments      Exercises Other Exercises Other Exercises: Toilet transfer, SPT with RW to [facility], min/mod assist for sit/stand, standing balance and hygiene.  Poor balance.  Mod cuing to ensure full alignment with seating surface prior to sitting.  Impulsive; limited insight into balance deficits.  (8 minutes)      Assessment/Plan    PT Assessment Patient needs continued PT services  PT Diagnosis Difficulty walking;Generalized weakness   PT Problem List Decreased strength;Decreased range of motion;Decreased activity tolerance;Decreased balance;Decreased mobility;Decreased coordination;Decreased safety awareness  PT Treatment Interventions DME instruction;Gait training;Stair training;Functional mobility training;Therapeutic activities;Therapeutic exercise;Balance training;Neuromuscular re-education;Patient/family education   PT Goals (Current goals can be found in the Care Plan section) Acute Rehab PT Goals Patient Stated Goal: "to wash my mouth out--it's so dry" PT Goal Formulation: With patient Time For Goal Achievement: 09/08/14 Potential to Achieve Goals: Good    Frequency Min 2X/week (if CVA  positive, will increase treatment frequency to QD)   Barriers to discharge Decreased caregiver support limited physical assist from husband    Co-evaluation               End of Session Equipment Utilized During Treatment: Gait belt Activity Tolerance: Patient limited by fatigue Patient left: in chair;with chair alarm set;with call bell/phone within reach;with family/visitor present Nurse Communication: Mobility status;Other (comment) (discharge recs for STR)         Time: 0912-0938 PT Time Calculation (min) (ACUTE ONLY): 26 min   Charges:   PT Evaluation $Initial PT Evaluation Tier I: 1 Procedure PT Treatments $Therapeutic Activity: 8-22 mins   PT G Codes:        Ellison Hughs 09-01-14, 9:43 AM

## 2014-08-24 NOTE — Progress Notes (Signed)
Patient feeling very "sick".  Unable to pinpoint exactly what is wrong but just "feels bad".  VSS.  NSR on monitor.  Plan to D/C tomorrow.  No complaints of pain.  Up to bedside commode with one assist.

## 2014-08-24 NOTE — Progress Notes (Signed)
Pt lethargic, but oriented x4 when awake.  Pt has notable tremor to R hand when getting up.  Pt transferred with 2 assist to [facility].  Pt tolerated well.  No complaints of pain.  No dyspnea with exertion.  No neuro deficits noted.  Pt rested well throughout shift.  Lynnda Shields, RN

## 2014-08-24 NOTE — Evaluation (Signed)
Clinical/Bedside Swallow Evaluation Patient Details  Name: Brenda Roman MRN: [id number] Date of Birth: 1924-01-01  Today's Date: 08/24/2014 Time:        Past Medical History:  Past Medical History  Diagnosis Date  . Liver transplanted   . Macular degeneration   . Coronary artery disease    Past Surgical History:  Past Surgical History  Procedure Laterality Date  . Coronary artery bypass graft     HPI:      Assessment / Plan / Recommendation Clinical Impression   Pt appeared to present w/ no overt s/s of aspiration during this BSE. Pt appeared to safely tolerate all trial consistencies w/ no oropharyngeal phase dysphagia. Pt attempted to feed self but exhibited shakiness in the RUE requiring assistance. Recommend a regular consistency diet at this time; general aspiration precautions; meds in Puree if easier to swallow (husband stated pt took larger pills which could be difficult to swallow). Recommend tray setup and assistance at meals at this time. ST will f/u /w toleration of diet x1-2 sessions next 1-3 days.     Aspiration Risk   pt appears at reduced risk for aspiration at this time following general aspiration precautions    Diet Recommendation Thin;Other (Comment) (Regular)   Medication Administration: Other (Comment) (as nec. ) Compensations: Slow rate;Small sips/bites    Other  Recommendations Oral Care Recommendations: Oral care BID   Follow Up Recommendations       Frequency and Duration    1 week   Pertinent Vitals/Pain denied    SLP Swallow Goals     Swallow Study Prior Functional Status  Type of Home: House Available Help at Discharge: Family    General Date of Onset: 08/23/14 Type of Study: Bedside swallow evaluation Previous Swallow Assessment: none indicated Diet Prior to this Study: Regular;Thin liquids Temperature Spikes Noted: No Respiratory Status: Room air History of Recent Intubation: No Behavior/Cognition: Alert;Cooperative;Pleasant  mood Oral Cavity - Dentition: Adequate natural dentition/normal for age Self-Feeding Abilities: Needs assist;Other (Comment) (shakiness in RUE) Patient Positioning: Upright in bed Baseline Vocal Quality: Normal Volitional Cough: Strong Volitional Swallow: Able to elicit    Oral/Motor/Sensory Function Overall Oral Motor/Sensory Function: Appears within functional limits for tasks assessed   Ice Chips Ice chips: Within functional limits Presentation: Spoon   Thin Liquid Thin Liquid: Within functional limits Presentation: Cup;Straw Other Comments: ~4 ozs    Nectar Thick Nectar Thick Liquid: Not tested   Honey Thick Honey Thick Liquid: Not tested   Puree Puree: Within functional limits Presentation: Spoon;Self Fed Other Comments: 6 trials   Solid       Solid: Within functional limits Presentation: Spoon;Self Fed Other Comments: 4 trials       Watson,Katherine 08/24/2014,1:11 PM

## 2014-08-24 NOTE — Progress Notes (Signed)
[facility] [place] NAME: Brenda Roman    MR#:  [id number]  DATE OF BIRTH:  17-Jul-1923  SUBJECTIVE:  CHIEF COMPLAINT:   Patient has improved. She no longer has speech impairment. Physical therapy has recommended that the patient go to skilled nursing facility.          REVIEW OF SYSTEMS:  CONSTITUTIONAL: No fever, fatigue or weakness.  EYES: No blurred or double vision.  EARS, NOSE, AND THROAT: No tinnitus or ear pain.  RESPIRATORY: No cough, shortness of breath, wheezing or hemoptysis.  CARDIOVASCULAR: No chest pain, orthopnea, edema.  GASTROINTESTINAL: No nausea, vomiting, diarrhea or abdominal pain.  GENITOURINARY: No dysuria, hematuria.  ENDOCRINE: No polyuria, nocturia,  HEMATOLOGY: No anemia, easy bruising or bleeding SKIN: No rash or lesion. MUSCULOSKELETAL: She has some mild weakness which is generalized  NEUROLOGIC: No tingling, numbness, weakness.  PSYCHIATRY: No anxiety or depression.   DRUG ALLERGIES:   Allergies  Allergen Reactions  . Cephalexin Other (See Comments)    Reaction:  Unknown  . Novocain [Procaine] Rash  . Sulfa Antibiotics Rash    VITALS:  Blood pressure 146/61, pulse 85, temperature 98.5 F (36.9 C), temperature source Oral, resp. rate 18, height 5' 5"  (1.651 m), weight 60.827 kg (134 lb 1.6 oz), SpO2 97 %.  PHYSICAL EXAMINATION:  GENERAL:  81 y.o.-year-old patient lying in the bed with no acute distress.  EYES: Pupils equal, round, reactive to light and accommodation. No scleral icterus. Extraocular muscles intact.  HEENT: Head atraumatic, normocephalic. Oropharynx and nasopharynx clear.  NECK:  Supple, no jugular venous distention. No thyroid enlargement, no tenderness.  LUNGS: Normal breath sounds bilaterally, no wheezing, rales,rhonchi or crepitation. No use of accessory muscles of respiration.  CARDIOVASCULAR: S1, S2 normal. No murmurs, rubs, or gallops.  ABDOMEN: Soft, nontender,  nondistended. Bowel sounds present. No organomegaly or mass.  EXTREMITIES: No pedal edema, cyanosis, or clubbing.  NEUROLOGIC: Cranial nerves II through XII are intact. Muscle strength 5/5 in all extremities. Sensation intact. Gait not checked.  PSYCHIATRIC: The patient is alert and oriented x 3.  SKIN: No obvious rash, lesion, or ulcer.    LABORATORY PANEL:   CBC  Recent Labs Lab 08/23/14 1135  WBC 6.3  HGB 10.5*  HCT 33.8*  PLT 298   ------------------------------------------------------------------------------------------------------------------  Chemistries   Recent Labs Lab 08/23/14 1135  NA 129*  K 3.7  CL 94*  CO2 27  GLUCOSE 97  BUN 13  CREATININE 0.84  CALCIUM 8.9  AST 22  ALT 17  ALKPHOS 58  BILITOT 0.5   ------------------------------------------------------------------------------------------------------------------  Cardiac Enzymes No results for input(s): TROPONINI in the last 168 hours. ------------------------------------------------------------------------------------------------------------------  RADIOLOGY:  Ct Head Wo Contrast  08/23/2014   CLINICAL DATA:  Headache for 5 days. Decreased consciousness. Slurred speech for 1 day. Gait unsteadiness for 48 hours.  EXAM: CT HEAD WITHOUT CONTRAST  TECHNIQUE: Contiguous axial images were obtained from the base of the skull through the vertex without intravenous contrast.  COMPARISON:  12/03/2013  FINDINGS: Sinuses/Soft tissues: Clear paranasal sinuses and mastoid air cells.  Intracranial: Expected cerebral volume loss for age. Moderate low density in the periventricular white matter likely related to small vessel disease. Minimal motion degradation.  No mass lesion, hemorrhage, hydrocephalus, acute infarct, intra-axial, or extra-axial fluid collection.  IMPRESSION: 1.  No acute intracranial abnormality. 2.  Cerebral atrophy and small vessel ischemic change.   Electronically Signed   By: Adria Devon.D.  On: 08/23/2014 13:08   Mr Jeri Cos Wo Contrast  08/24/2014     IMPRESSION: 1. No acute infarct. 2. Small, subacute to chronic left parietal cortical infarct. 3. Moderate chronic small vessel ischemic disease. 4. 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   Electronically Signed   By: Logan Bores   On: 08/24/2014 12:52   US Carotid Bilateral  08/24/2014    IMPRESSION: Mild atherosclerotic disease in the carotid arteries bilaterally, left side greater than right. Estimated degree of stenosis in the internal carotid arteries is less than 50% bilaterally.  Patent vertebral arteries bilaterally.   Electronically Signed   By: Markus Daft M.D.   On: 08/24/2014 12:57   Dg Chest Port 1 View  08/23/2014     IMPRESSION: No active disease.  Status post CABG again noted.   Electronically Signed   By: Lahoma Crocker M.D.   On: 08/23/2014 12:28    EKG:  No orders found for this or any previous visit.  ASSESSMENT AND PLAN:   Brenda Costa, MD Physician Signed  H&P 08/23/2014 3:58 PM    Expand All Collapse All         Patient Demographics  Brenda Roman, is a 81 y.o. female MRN: [id number] DOB - 05-25-1923  Admit Date - 08/23/2014  Outpatient Primary MD for the patient is Kirk Ruths., MD    Chief Complaint  Patient presents with  . Headache     HPI  Brenda Roman is a 81 y.o. female with past medical history significant for ischemic heart disease, non-Q-wave MI, atrial fibrillation, temporal arteritis, liver transplant on CellCept and steroids, TIA who presents from her PCPs office after 1 week of weakness. Patient has been recently treated for urinary tract infection since that time patient's family has noted her to be increasingly lethargic and weak. She  has also been complaining of a severe headache for the past 3 days. She saw Dr. Ginette Pitman this past Friday due to her ongoing weakness and headache. They felt that she was dehydrated and encourage her to drink plenty of fluids. They did recommend if she wasn't feeling better to go back to the doctor for follow-up. She presented today to her PCPs office with above issues. She was asked to come to the ER as she is severely weak and her family had noticed slurred speech this morning. My examination patient deathly has slurred speech and garbled speech. She has no other focal neurological deficits. She has a history of temporal arteritis and her ESR was recently checked which was normal   Review of Systems   Due to incoherent speech were unable to obtain adequate review of systems and the patient. Past medical History Past Medical History  Diagnosis Date  . Liver transplanted   . Macular degeneration   . Coronary artery disease      Past Surgical History Past Surgical History  Procedure Laterality Date  . Coronary artery bypass graft      Social History History  Substance Use Topics  . Smoking status: Never Smoker   . Smokeless tobacco: Not on file  . Alcohol Use: No     Family History Unknown  Prior to Admission medications   Calcium carbonate 1 tablet twice a day Aspirin 81 mg 2 tablets daily Vitamin D 3000 international unit daily Vytorin 10/20 one tablet nightly Metoprolol 100 mg a.m. Metabolic 50 mg p.m. Metoprolol 25 mg twice a day Multivitamin CellCept  500 mg every 12 hours Nitroglycerin glycerin supplement. Chest pain Prednisone 5 mg 1 tablet by mouth daily Xarelto 20 mg times daily Ambien 5 g at bedtime     No Known Allergies  Physical Exam  Vitals  Blood pressure 158/65, pulse 81, temperature 100.6 F (38.1 C), temperature source Oral, resp. rate 20, height 5' 5"  (1.651 m), weight 61.236 kg (135 lb), SpO2 97  %.  Constitutional: Well-developed and well-nourished. No distress.  HENT:  Head: Normocephalic and atraumatic.  Mouth/Throat: Oropharynx is clear and moist.  Eyes: Pupils are equal, round, and reactive to light.  Neck: Normal range of motion. Neck supple. No JVD present. No tracheal deviation present. No thyromegaly present.  Cardiovascular: Normal rate, regular rhythm and normal heart sounds. Exam reveals no gallop.  No murmur heard. Respiratory: Effort normal and breath sounds normal. No respiratory distress. No wheezing, crackles, rales. GI: Soft. No distension. There is no tenderness. There is no rebound and no guarding.  Musculoskeletal:No edema or tenderness.  Neurological: Patient definitely exhibits garbled speech. It appears that she may have a very slight left facial droop as well. Skin: Skin is warm and dry. No rash noted.  Psychiatric: Normal mood. Flat affect   Data Review  CBC  Last Labs      Recent Labs Lab 08/23/14 1135  WBC 6.3  HGB 10.5*  HCT 33.8*  PLT 298  MCV 82.6  MCH 25.7*  MCHC 31.2*  RDW 15.7*  LYMPHSABS 1.0  MONOABS 0.6  EOSABS 0.0  BASOSABS 0.0     ------------------------------------------------------------------------------------------------------------------  Chemistries   Last Labs      Recent Labs Lab 08/23/14 1135  NA 129*  K 3.7  CL 94*  CO2 27  GLUCOSE 97  BUN 13  CREATININE 0.84  CALCIUM 8.9  AST 22  ALT 17  ALKPHOS 58  BILITOT 0.5     ------------------------------------------------------------------------------------------------------------------ estimated creatinine clearance is 41.7 mL/min (by C-G formula based on Cr of  0.84). ------------------------------------------------------------------------------------------------------------------     ---------------------------------------------------------------------------------------------------------------  Urinalysis  Labs (Brief)       Component Value Date/Time   COLORURINE YELLOW* 08/23/2014 1032   APPEARANCEUR CLEAR* 08/23/2014 1032   LABSPEC 1.011 08/23/2014 1032   PHURINE 7.0 08/23/2014 1032   GLUCOSEU NEGATIVE 08/23/2014 1032   HGBUR NEGATIVE 08/23/2014 1032   BILIRUBINUR NEGATIVE 08/23/2014 1032   KETONESUR TRACE* 08/23/2014 [place] 08/23/2014 1032   NITRITE NEGATIVE 08/23/2014 1032   LEUKOCYTESUR NEGATIVE 08/23/2014 1032      ----------------------------------------------------------------------------------------------------------------  Imaging results:  CT-scan of the brain IMPRESSION: 1. No acute intracranial abnormality. 2. Cerebral atrophy and small vessel ischemic change  EKG pending   Assessment & Plan  1. TIA: MRI is negative for acute stroke. Patient's symptoms of a result resolved. Patient will require neurology consultation for further evaluation and management as per medications. Speech therapy has seen the patient as well and she passed her swallow evaluation. Physical therapy has recommended skilled nursing facility. His management is working with the patient for placement. For now we will continue aspirin and statin and Xarelto. I do not want to add Plavix due tohe use of other anticoagulation agents. Her carotid Doppler showed no evidence of major stenosis. Echocardiogram is pending. 2. History of atrial fibrillation: We will continue xarelto. I will continue metoprolol. 3. History of PBC and liver transplant: Patient's currently on CellCept and prednisone which I will continue. 4. Dehydration: I will repeat sodium level as this was low on admission.  Appear to be clinically divided. DVT Prophylaxis xarelto   Family Communication: Discussed with husband at bedside. Code Status full Time spent in minutes : 64 Joni Colegrove, MD                                   All the records are reviewed and case discussed with Care Management/Social Workerr. Management plans discussed with the patient, family and they are in agreement.  CODE STATUS: Full  TOTAL TIME TAKING CARE OF THIS PATIENT: 35 minutes.   POSSIBLE D/C tomorrow DEPENDING ON  PLACEMENT  Glenn Gullickson M.D on 08/24/2014 at 2:00 PM  Between 7am to 6pm - Pager - [phone]  After 6pm go to [email or web address] - password [email or web address] [facility]  Office  [phone]  CC: Primary care physician; Kirk Ruths., MD

## 2014-08-24 NOTE — Care Management Note (Addendum)
   Case Management Note  Patient Details  Name: Winnie Umali MRN: [id number] Date of Birth: 06/14/1923  Subjective/Objective:               Patient was to have gone to skilled nursing but patient and family has decided to discharge home with home health.  There is a neuro consult pending.  Family is anxiously awaiting neuro consult.  Home health services will be nursing, physical therapy, occupational therapy, slp and aide.  Requests hospital bed  .    [facility] to [facility] .  Family in agreement.  If neuro consults gets completed and hospital bed gets delivered, patient can discharge today.  FAmily  Will arrange for 24/7 round the clock care.  She has a rolling walker and BSC.  Confirmed demographics and contact information           Expected Discharge Date:       08/24/2014           Expected Discharge Plan:     In-House Referral:     Discharge planning Services     Home health and hospital bedHusband  Post Acute Care Choice:     Choice offered to:   Husband  DME Arranged:   Semi electric hospital bed   Patient has a cva which requires upper body to be positioned in ways not feasible with a normal bed. Head must be elevated at least  30 degrees to prevent aspiration and assist in maintaining patient's airway and assist with positioning      DME Agency:   [place]:   yes [place]:   [facility]  Status of Service:     Medicare Important Message Given:    Date Medicare IM Given:    Medicare IM give by:    Date Additional Medicare IM Given:    Additional Medicare Important Message give by:     If discussed at [place] of Stay Meetings, dates discussed:    Additional Comments:  Katrina Stack, RN 08/24/2014, 3:47 PM

## 2014-08-25 ENCOUNTER — Inpatient Hospital Stay: Payer: Medicare Other

## 2014-08-25 DIAGNOSIS — G451 Carotid artery syndrome (hemispheric): Secondary | ICD-10-CM

## 2014-08-25 LAB — BASIC METABOLIC PANEL
ANION GAP: 8 (ref 5–15)
BUN: 10 mg/dL (ref 6–20)
CHLORIDE: 99 mmol/L — AB (ref 101–111)
CO2: 24 mmol/L (ref 22–32)
CREATININE: 0.63 mg/dL (ref 0.44–1.00)
Calcium: 7.5 mg/dL — ABNORMAL LOW (ref 8.9–10.3)
GFR calc Af Amer: >60 mL/min (ref >60)
GFR calc non Af Amer: >60 mL/min (ref >60)
Glucose, Bld: 90 mg/dL (ref 65–99)
Potassium: 3.3 mmol/L — ABNORMAL LOW (ref 3.5–5.1)
Sodium: 131 mmol/L — ABNORMAL LOW (ref 135–145)

## 2014-08-25 MED ORDER — DOCUSATE SODIUM 100 MG PO CAPS
100.0000 mg | ORAL_CAPSULE | Freq: Two times a day (BID) | ORAL | Status: DC
  Administered 2014-08-25: 100 mg via ORAL
  Filled 2014-08-25: qty 1

## 2014-08-25 MED ORDER — CALCIUM CARBONATE ANTACID 500 MG PO CHEW
CHEWABLE_TABLET | ORAL | Status: AC
  Administered 2014-08-25: 200 mg
  Filled 2014-08-25: qty 1

## 2014-08-25 MED ORDER — CALCIUM CARBONATE-VITAMIN D 500-200 MG-UNIT PO TABS
ORAL_TABLET | ORAL | Status: AC
  Administered 2014-08-25: 11:00:00
  Filled 2014-08-25: qty 1

## 2014-08-25 MED ORDER — VITAMIN D 1000 UNITS PO TABS
ORAL_TABLET | ORAL | Status: AC
  Administered 2014-08-25: 1000 [IU]
  Filled 2014-08-25: qty 1

## 2014-08-25 NOTE — Progress Notes (Signed)
Pt discharged to home with her family, up to chair with min assist this AM, responsive, although slow, neuro visited pt before DC.  IV sites DCd, tele monitor turned in, daughter given f/u appt and DC instructions, understanding verbalized, pt left hospital in car with her family.

## 2014-08-25 NOTE — Progress Notes (Signed)
Spoke wi/ MD re pt's discharge, MD declines to treat K of 3.3 prior to discharge

## 2014-08-25 NOTE — Clinical Social Work Note (Signed)
CSW spoke to pt's husband and her son. Pt was not awake at time of assessment.  Currently pt's husband and son [name] [phone] would like to take pt home with home health.  CSW confirmed with family that someone in the pt's family would be able to assist the pt 24 hours a day.  CSW notified RNCM of families decision.  CSW signing off.

## 2014-08-25 NOTE — Progress Notes (Addendum)
Subjective: 81 y.o. female with past medical history significant for ischemic heart disease, non-Q-wave MI, atrial fibrillation, temporal arteritis, liver transplant on CellCept and steroids, TIA who presents from her PCPs office after 1 week of weakness. Patient has been recently treated for urinary tract infection since that time patient's family has noted her to be increasingly lethargic and weak. She has also been complaining of a severe headache for the past 3 days. She saw Dr. Ginette Pitman this past Friday due to her ongoing weakness and headache.   Back to baseline today. No new complaints besides generalized weakness.     Current vital signs: BP 147/65 mmHg  Pulse 70  Temp(Src) 98.1 F (36.7 C) (Oral)  Resp 18  Ht 5' 5"  (1.651 m)  Wt 61.19 kg (134 lb 14.4 oz)  BMI 22.45 kg/m2  SpO2 95% Vital signs in last 24 hours: Temp:  [98.1 F (36.7 C)-99.5 F (37.5 C)] 98.1 F (36.7 C) (05/04 1109) Pulse Rate:  [70-80] 70 (05/04 1109) Resp:  [18-25] 18 (05/04 1109) BP: (147-173)/(64-66) 147/65 mmHg (05/04 1109) SpO2:  [91 %-95 %] 95 % (05/04 1109) Weight:  [61.19 kg (134 lb 14.4 oz)] 61.19 kg (134 lb 14.4 oz) (05/04 0405)  Intake/Output from previous day: 05/03 0701 - 05/04 0700 In: 1186.7 [I.V.:1136.7; IV Piggyback:50] Out: 1800 [Urine:1800] Intake/Output this shift: Total I/O In: 350 [P.O.:25; I.V.:325] Out: 0  Nutritional status: Diet regular Room service appropriate?: Yes; Fluid consistency:: Thin Diet - low sodium heart healthy   Lab Results: Results for orders placed or performed during the hospital encounter of 08/23/14 (from the past 48 hour(s))  Culture, blood (routine x 2)     Status: None (Preliminary result)   Collection Time: 08/23/14  4:15 PM  Result Value Ref Range   Specimen Description BLOOD    Special Requests BLOOD    Culture NO GROWTH < 24 HOURS    Report Status PENDING   Lipid panel     Status: Abnormal   Collection Time: 08/23/14  6:19 PM  Result Value Ref  Range   Cholesterol 157 0 - 200 mg/dL   Triglycerides 99 <150 mg/dL   HDL 30 (L) >40 mg/dL   Total CHOL/HDL Ratio 5.2 RATIO   VLDL 20 0 - 40 mg/dL   LDL Cholesterol 107 (H) 0 - 99 mg/dL    Comment:        Total Cholesterol/HDL:CHD Risk Coronary Heart Disease Risk Table                     Men   Women  1/2 Average Risk   3.4   3.3  Average Risk       5.0   4.4  2 X Average Risk   9.6   7.1  3 X Average Risk  23.4   11.0        Use the calculated Patient Ratio above and the CHD Risk Table to determine the patient's CHD Risk.        ATP III CLASSIFICATION (LDL):  <100     mg/dL   Optimal  100-129  mg/dL   Near or Above                    Optimal  130-159  mg/dL   Borderline  160-189  mg/dL   High  >190     mg/dL   Very High   Basic metabolic panel     Status: Abnormal   Collection  Time: 08/25/14  5:23 AM  Result Value Ref Range   Sodium 131 (L) 135 - 145 mmol/L   Potassium 3.3 (L) 3.5 - 5.1 mmol/L   Chloride 99 (L) 101 - 111 mmol/L   CO2 24 22 - 32 mmol/L   Glucose, Bld 90 65 - 99 mg/dL   BUN 10 6 - 20 mg/dL   Creatinine, Ser 0.63 0.44 - 1.00 mg/dL   Calcium 7.5 (L) 8.9 - 10.3 mg/dL   GFR calc non Af Amer >60 >60 mL/min   GFR calc Af Amer >60 >60 mL/min    Comment: (NOTE) The eGFR has been calculated using the CKD EPI equation. This calculation has not been validated in all clinical situations. eGFR's persistently <90 mL/min signify possible Chronic Kidney Disease.    Anion gap 8 5 - 15    Recent Results (from the past 240 hour(s))  Culture, blood (routine x 2)     Status: None (Preliminary result)   Collection Time: 08/23/14 11:35 AM  Result Value Ref Range Status   Specimen Description BLOOD  Final   Special Requests BLOOD  Final   Culture NO GROWTH < 24 HOURS  Final   Report Status PENDING  Incomplete  Culture, blood (routine x 2)     Status: None (Preliminary result)   Collection Time: 08/23/14  4:15 PM  Result Value Ref Range Status   Specimen  Description BLOOD  Final   Special Requests BLOOD  Final   Culture NO GROWTH < 24 HOURS  Final   Report Status PENDING  Incomplete    Lipid Panel  Recent Labs  08/23/14 1819  CHOL 157  TRIG 99  HDL 30*  CHOLHDL 5.2  VLDL 20  LDLCALC 107*    Studies/Results: Dg Abd 1 View  08/25/2014   CLINICAL DATA:  Nausea, history of liver transplant and CABG  EXAM: ABDOMEN - 1 VIEW  COMPARISON:  None.  FINDINGS: The bowel gas pattern is within the limits of normal. There are surgical clips in the gallbladder fossa. There are degenerative changes of the lower lumbar spine. The patient has undergone previous ORIF for an intertrochanteric fracture the right hip. There are vascular clips in the inguinal regions bilaterally.  IMPRESSION: No acute intra-abdominal abnormality is demonstrated.   Electronically Signed   By: David  Martinique M.D.   On: 08/25/2014 10:35   Mr Jeri Cos IO Contrast  08/24/2014   CLINICAL DATA:  Headache 3 days ago. Dizziness and difficulty ambulating. Disorientation 2 days ago.  EXAM: MRI HEAD WITHOUT AND WITH CONTRAST  TECHNIQUE: Multiplanar, multiecho pulse sequences of the brain and surrounding structures were obtained without and with intravenous contrast.  CONTRAST:  107m MULTIHANCE GADOBENATE DIMEGLUMINE 529 MG/ML IV SOLN  COMPARISON:  Head CT 08/23/2014  FINDINGS: Images are mildly degraded by motion artifact.  Limited visualization of the upper cervical spine demonstrates no acute infarct is identified. Small focus of cortical increased diffusion-weighted signal in the left parietal lobe reflects T2 shine through related to a small, subacute to chronic infarct. There is no midline shift or extra-axial fluid collection. There is mild-to-moderate cerebral atrophy. Patchy and confluent T2 hyperintensities in the subcortical and deep cerebral white matter are nonspecific but compatible with moderate chronic small vessel ischemic disease. A small, chronic infarct is noted in the right  cerebellum. There is a 7 mm T2 hyperintense, enhancing lesion in the inferior right frontal lobe cortex (series 13, image 28 and series 14, image 8) with associated susceptibility  artifact. No abnormal enhancement is seen elsewhere.  Prior right cataract extraction is noted. Mild mucosal thickening is noted in the right maxillary sinus and bilateral ethmoid air cells. Mastoid air cells are clear. Major intracranial vascular flow voids are preserved.  IMPRESSION: 1. No acute infarct. 2. Small, subacute to chronic left parietal cortical infarct. 3. Moderate chronic small vessel ischemic disease. 4. 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   Electronically Signed   By: Logan Bores   On: 08/24/2014 12:52   US Carotid Bilateral  08/24/2014   CLINICAL DATA:  History of headaches. Dizziness and trouble ambulating.  EXAM: BILATERAL CAROTID DUPLEX ULTRASOUND  TECHNIQUE: Pearline Cables scale imaging, color Doppler and duplex ultrasound were performed of bilateral carotid and vertebral arteries in the neck.  COMPARISON:  None.  FINDINGS: Criteria: Quantification of carotid stenosis is based on velocity parameters that correlate the residual internal carotid diameter with NASCET-based stenosis levels, using the diameter of the distal internal carotid lumen as the denominator for stenosis measurement.  The following velocity measurements were obtained:  RIGHT  ICA:  62 cm/sec  CCA:  64 cm/sec  SYSTOLIC ICA/CCA RATIO:  1.0  DIASTOLIC ICA/CCA RATIO:  0.8  ECA:  73 cm/sec  LEFT  ICA:  93 cm/sec  CCA:  73 cm/sec  SYSTOLIC ICA/CCA RATIO:  1.3  DIASTOLIC ICA/CCA RATIO:  2.0  ECA:  72 cm/sec  RIGHT CAROTID ARTERY: Small amount of plaque at the carotid bulb and proximal internal carotid artery. No significant stenosis in the right carotid arteries.  RIGHT VERTEBRAL ARTERY: Antegrade flow and normal waveform in the right vertebral artery.  LEFT CAROTID ARTERY: Small amount of  echogenic plaque at the carotid bulb and proximal internal carotid artery. No significant stenosis in left internal carotid artery. Normal waveforms and velocities in left internal carotid artery.  LEFT VERTEBRAL ARTERY: Antegrade flow and normal waveform in the left vertebral artery.  IMPRESSION: Mild atherosclerotic disease in the carotid arteries bilaterally, left side greater than right. Estimated degree of stenosis in the internal carotid arteries is less than 50% bilaterally.  Patent vertebral arteries bilaterally.   Electronically Signed   By: Markus Daft M.D.   On: 08/24/2014 12:57    Medications: I have reviewed the patient's current medications.  Assessment/Plan:  @HMPROB @    LOS: 2 days   @LRSIGN @ 08/25/2014  12:58 PM   Subjective:   Objective: Current vital signs: BP 147/65 mmHg  Pulse 70  Temp(Src) 98.1 F (36.7 C) (Oral)  Resp 18  Ht 5' 5"  (1.651 m)  Wt 61.19 kg (134 lb 14.4 oz)  BMI 22.45 kg/m2  SpO2 95% Vital signs in last 24 hours: Temp:  [98.1 F (36.7 C)-99.5 F (37.5 C)] 98.1 F (36.7 C) (05/04 1109) Pulse Rate:  [70-80] 70 (05/04 1109) Resp:  [18-25] 18 (05/04 1109) BP: (147-173)/(64-66) 147/65 mmHg (05/04 1109) SpO2:  [91 %-95 %] 95 % (05/04 1109) Weight:  [61.19 kg (134 lb 14.4 oz)] 61.19 kg (134 lb 14.4 oz) (05/04 0405)  Intake/Output from previous day: 05/03 0701 - 05/04 0700 In: 1186.7 [I.V.:1136.7; IV Piggyback:50] Out: 1800 [Urine:1800] Intake/Output this shift: Total I/O In: 350 [P.O.:25; I.V.:325] Out: 0  Nutritional status: Diet regular Room service appropriate?: Yes; Fluid consistency:: Thin Diet - low sodium heart healthy  Neurologic Exam:   Lab Results: Results for orders placed or performed during the hospital encounter of 08/23/14 (from the past 48 hour(s))  Culture, blood (  routine x 2)     Status: None (Preliminary result)   Collection Time: 08/23/14  4:15 PM  Result Value Ref Range   Specimen Description BLOOD    Special  Requests BLOOD    Culture NO GROWTH < 24 HOURS    Report Status PENDING   Lipid panel     Status: Abnormal   Collection Time: 08/23/14  6:19 PM  Result Value Ref Range   Cholesterol 157 0 - 200 mg/dL   Triglycerides 99 <150 mg/dL   HDL 30 (L) >40 mg/dL   Total CHOL/HDL Ratio 5.2 RATIO   VLDL 20 0 - 40 mg/dL   LDL Cholesterol 107 (H) 0 - 99 mg/dL    Comment:        Total Cholesterol/HDL:CHD Risk Coronary Heart Disease Risk Table                     Men   Women  1/2 Average Risk   3.4   3.3  Average Risk       5.0   4.4  2 X Average Risk   9.6   7.1  3 X Average Risk  23.4   11.0        Use the calculated Patient Ratio above and the CHD Risk Table to determine the patient's CHD Risk.        ATP III CLASSIFICATION (LDL):  <100     mg/dL   Optimal  100-129  mg/dL   Near or Above                    Optimal  130-159  mg/dL   Borderline  160-189  mg/dL   High  >190     mg/dL   Very High   Basic metabolic panel     Status: Abnormal   Collection Time: 08/25/14  5:23 AM  Result Value Ref Range   Sodium 131 (L) 135 - 145 mmol/L   Potassium 3.3 (L) 3.5 - 5.1 mmol/L   Chloride 99 (L) 101 - 111 mmol/L   CO2 24 22 - 32 mmol/L   Glucose, Bld 90 65 - 99 mg/dL   BUN 10 6 - 20 mg/dL   Creatinine, Ser 0.63 0.44 - 1.00 mg/dL   Calcium 7.5 (L) 8.9 - 10.3 mg/dL   GFR calc non Af Amer >60 >60 mL/min   GFR calc Af Amer >60 >60 mL/min    Comment: (NOTE) The eGFR has been calculated using the CKD EPI equation. This calculation has not been validated in all clinical situations. eGFR's persistently <90 mL/min signify possible Chronic Kidney Disease.    Anion gap 8 5 - 15    Recent Results (from the past 240 hour(s))  Culture, blood (routine x 2)     Status: None (Preliminary result)   Collection Time: 08/23/14 11:35 AM  Result Value Ref Range Status   Specimen Description BLOOD  Final   Special Requests BLOOD  Final   Culture NO GROWTH < 24 HOURS  Final   Report Status PENDING   Incomplete  Culture, blood (routine x 2)     Status: None (Preliminary result)   Collection Time: 08/23/14  4:15 PM  Result Value Ref Range Status   Specimen Description BLOOD  Final   Special Requests BLOOD  Final   Culture NO GROWTH < 24 HOURS  Final   Report Status PENDING  Incomplete    Lipid Panel  Recent Labs  08/23/14  1819  CHOL 157  TRIG 99  HDL 30*  CHOLHDL 5.2  VLDL 20  LDLCALC 107*    Studies/Results: Dg Abd 1 View  08/25/2014   CLINICAL DATA:  Nausea, history of liver transplant and CABG  EXAM: ABDOMEN - 1 VIEW  COMPARISON:  None.  FINDINGS: The bowel gas pattern is within the limits of normal. There are surgical clips in the gallbladder fossa. There are degenerative changes of the lower lumbar spine. The patient has undergone previous ORIF for an intertrochanteric fracture the right hip. There are vascular clips in the inguinal regions bilaterally.  IMPRESSION: No acute intra-abdominal abnormality is demonstrated.   Electronically Signed   By: David  Martinique M.D.   On: 08/25/2014 10:35   Mr Jeri Cos VC Contrast  08/24/2014   CLINICAL DATA:  Headache 3 days ago. Dizziness and difficulty ambulating. Disorientation 2 days ago.  EXAM: MRI HEAD WITHOUT AND WITH CONTRAST  TECHNIQUE: Multiplanar, multiecho pulse sequences of the brain and surrounding structures were obtained without and with intravenous contrast.  CONTRAST:  65m MULTIHANCE GADOBENATE DIMEGLUMINE 529 MG/ML IV SOLN  COMPARISON:  Head CT 08/23/2014  FINDINGS: Images are mildly degraded by motion artifact.  Limited visualization of the upper cervical spine demonstrates no acute infarct is identified. Small focus of cortical increased diffusion-weighted signal in the left parietal lobe reflects T2 shine through related to a small, subacute to chronic infarct. There is no midline shift or extra-axial fluid collection. There is mild-to-moderate cerebral atrophy. Patchy and confluent T2 hyperintensities in the subcortical and  deep cerebral white matter are nonspecific but compatible with moderate chronic small vessel ischemic disease. A small, chronic infarct is noted in the right cerebellum. There is a 7 mm T2 hyperintense, enhancing lesion in the inferior right frontal lobe cortex (series 13, image 28 and series 14, image 8) with associated susceptibility artifact. No abnormal enhancement is seen elsewhere.  Prior right cataract extraction is noted. Mild mucosal thickening is noted in the right maxillary sinus and bilateral ethmoid air cells. Mastoid air cells are clear. Major intracranial vascular flow voids are preserved.  IMPRESSION: 1. No acute infarct. 2. Small, subacute to chronic left parietal cortical infarct. 3. Moderate chronic small vessel ischemic disease. 4. 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   Electronically Signed   By: ALogan Bores  On: 08/24/2014 12:52   UKoreaCarotid Bilateral  08/24/2014   CLINICAL DATA:  History of headaches. Dizziness and trouble ambulating.  EXAM: BILATERAL CAROTID DUPLEX ULTRASOUND  TECHNIQUE: GPearline Cablesscale imaging, color Doppler and duplex ultrasound were performed of bilateral carotid and vertebral arteries in the neck.  COMPARISON:  None.  FINDINGS: Criteria: Quantification of carotid stenosis is based on velocity parameters that correlate the residual internal carotid diameter with NASCET-based stenosis levels, using the diameter of the distal internal carotid lumen as the denominator for stenosis measurement.  The following velocity measurements were obtained:  RIGHT  ICA:  62 cm/sec  CCA:  64 cm/sec  SYSTOLIC ICA/CCA RATIO:  1.0  DIASTOLIC ICA/CCA RATIO:  0.8  ECA:  73 cm/sec  LEFT  ICA:  93 cm/sec  CCA:  73 cm/sec  SYSTOLIC ICA/CCA RATIO:  1.3  DIASTOLIC ICA/CCA RATIO:  2.0  ECA:  72 cm/sec  RIGHT CAROTID ARTERY: Small amount of plaque at the carotid bulb and proximal internal carotid artery. No significant stenosis in  the right carotid arteries.  RIGHT VERTEBRAL ARTERY: Antegrade flow  and normal waveform in the right vertebral artery.  LEFT CAROTID ARTERY: Small amount of echogenic plaque at the carotid bulb and proximal internal carotid artery. No significant stenosis in left internal carotid artery. Normal waveforms and velocities in left internal carotid artery.  LEFT VERTEBRAL ARTERY: Antegrade flow and normal waveform in the left vertebral artery.  IMPRESSION: Mild atherosclerotic disease in the carotid arteries bilaterally, left side greater than right. Estimated degree of stenosis in the internal carotid arteries is less than 50% bilaterally.  Patent vertebral arteries bilaterally.   Electronically Signed   By: Markus Daft M.D.   On: 08/24/2014 12:57    Medications: I have reviewed the patient's current medications. HEENT-  Normocephalic, no lesions, without obvious abnormality.  Normal external eye and conjunctiva.  Normal TM's bilaterally.  Normal auditory canals and external ears. Normal external nose, mucus membranes and septum.  Normal pharynx.   Neurological Examination Mental Status: Alert, oriented, thought content appropriate.  Speech fluent without evidence of aphasia.  Able to follow 3 step commands without difficulty. Cranial Nerves: II: Discs flat bilaterally; Visual fields grossly normal, pupils equal, round, reactive to light and accommodation III,IV, VI: ptosis not present, extra-ocular motions intact bilaterally V,VII: smile symmetric, facial light touch sensation normal bilaterally VIII: hearing normal bilaterally IX,X: gag reflex present XI: bilateral shoulder shrug XII: midline tongue extension Motor: Right : Upper extremity   5/5    Left:     Upper extremity   5/5  Lower extremity   4+/5     Lower extremity   4+/5 Tone and bulk:normal tone throughout; no atrophy noted Sensory: Pinprick and light touch intact throughout, bilaterally Deep Tendon Reflexes: 1+ and symmetric  throughout Plantars: Right: downgoing   Left: downgoing Cerebellar: normal finger-to-nose, normal rapid alternating movements and normal heel-to-shin test Gait: not examined       Assessment/Plan: 81 y/o female with hx of A-fib on xarelto admitted with generalized weakness and slurry speech.  Speech is back to baseline. Strength much improved - s/p discussion with daughter and husband at bedside. Answered their questions - d/c today - s/p 2 gm Mg yesterday with improvement headache - pt was told to take daily Magnesium and Vitamin B copmlex as out pt.    Please call with questions.  Nieko Clarin    LOS: 2 days    08/25/2014  12:58 PM

## 2014-08-27 ENCOUNTER — Encounter: Payer: Self-pay | Admitting: Emergency Medicine

## 2014-08-27 ENCOUNTER — Emergency Department
Admission: EM | Admit: 2014-08-27 | Discharge: 2014-08-27 | Disposition: A | Discharge status: Home / Self Care | Payer: Medicare Other | Source: Home / Self Care | Attending: Emergency Medicine | Admitting: Emergency Medicine

## 2014-08-27 ENCOUNTER — Encounter
Admission: RE | Admit: 2014-08-27 | Discharge: 2014-08-27 | Disposition: A | Discharge status: Home / Self Care | Payer: Medicare Other | Source: Ambulatory Visit | Attending: Internal Medicine | Admitting: Internal Medicine

## 2014-08-27 ENCOUNTER — Emergency Department: Payer: Medicare Other

## 2014-08-27 ENCOUNTER — Other Ambulatory Visit: Payer: Self-pay

## 2014-08-27 DIAGNOSIS — I252 Old myocardial infarction: Secondary | ICD-10-CM

## 2014-08-27 DIAGNOSIS — Z87891 Personal history of nicotine dependence: Secondary | ICD-10-CM

## 2014-08-27 DIAGNOSIS — Y95 Nosocomial condition: Secondary | ICD-10-CM | POA: Diagnosis present

## 2014-08-27 DIAGNOSIS — R0902 Hypoxemia: Secondary | ICD-10-CM | POA: Diagnosis not present

## 2014-08-27 DIAGNOSIS — I482 Chronic atrial fibrillation: Secondary | ICD-10-CM | POA: Diagnosis present

## 2014-08-27 DIAGNOSIS — I2510 Atherosclerotic heart disease of native coronary artery without angina pectoris: Secondary | ICD-10-CM | POA: Diagnosis present

## 2014-08-27 DIAGNOSIS — S42202A Unspecified fracture of upper end of left humerus, initial encounter for closed fracture: Secondary | ICD-10-CM | POA: Insufficient documentation

## 2014-08-27 DIAGNOSIS — Z79899 Other long term (current) drug therapy: Secondary | ICD-10-CM | POA: Insufficient documentation

## 2014-08-27 DIAGNOSIS — Y9389 Activity, other specified: Secondary | ICD-10-CM | POA: Insufficient documentation

## 2014-08-27 DIAGNOSIS — I776 Arteritis, unspecified: Secondary | ICD-10-CM | POA: Diagnosis present

## 2014-08-27 DIAGNOSIS — H3530 Unspecified macular degeneration: Secondary | ICD-10-CM | POA: Diagnosis present

## 2014-08-27 DIAGNOSIS — Z882 Allergy status to sulfonamides status: Secondary | ICD-10-CM

## 2014-08-27 DIAGNOSIS — E871 Hypo-osmolality and hyponatremia: Secondary | ICD-10-CM | POA: Insufficient documentation

## 2014-08-27 DIAGNOSIS — W06XXXA Fall from bed, initial encounter: Secondary | ICD-10-CM | POA: Insufficient documentation

## 2014-08-27 DIAGNOSIS — E860 Dehydration: Secondary | ICD-10-CM | POA: Diagnosis present

## 2014-08-27 DIAGNOSIS — Z7952 Long term (current) use of systemic steroids: Secondary | ICD-10-CM | POA: Insufficient documentation

## 2014-08-27 DIAGNOSIS — Z7982 Long term (current) use of aspirin: Secondary | ICD-10-CM

## 2014-08-27 DIAGNOSIS — Y998 Other external cause status: Secondary | ICD-10-CM | POA: Insufficient documentation

## 2014-08-27 DIAGNOSIS — M1990 Unspecified osteoarthritis, unspecified site: Secondary | ICD-10-CM | POA: Diagnosis present

## 2014-08-27 DIAGNOSIS — F329 Major depressive disorder, single episode, unspecified: Secondary | ICD-10-CM | POA: Diagnosis present

## 2014-08-27 DIAGNOSIS — Z888 Allergy status to other drugs, medicaments and biological substances status: Secondary | ICD-10-CM

## 2014-08-27 DIAGNOSIS — Z8673 Personal history of transient ischemic attack (TIA), and cerebral infarction without residual deficits: Secondary | ICD-10-CM

## 2014-08-27 DIAGNOSIS — R627 Adult failure to thrive: Secondary | ICD-10-CM | POA: Diagnosis present

## 2014-08-27 DIAGNOSIS — J189 Pneumonia, unspecified organism: Principal | ICD-10-CM | POA: Diagnosis present

## 2014-08-27 DIAGNOSIS — E876 Hypokalemia: Secondary | ICD-10-CM | POA: Insufficient documentation

## 2014-08-27 DIAGNOSIS — Z66 Do not resuscitate: Secondary | ICD-10-CM | POA: Diagnosis present

## 2014-08-27 DIAGNOSIS — E222 Syndrome of inappropriate secretion of antidiuretic hormone: Secondary | ICD-10-CM | POA: Diagnosis present

## 2014-08-27 DIAGNOSIS — G9341 Metabolic encephalopathy: Secondary | ICD-10-CM | POA: Diagnosis present

## 2014-08-27 DIAGNOSIS — R531 Weakness: Secondary | ICD-10-CM | POA: Insufficient documentation

## 2014-08-27 DIAGNOSIS — F0390 Unspecified dementia without behavioral disturbance: Secondary | ICD-10-CM | POA: Diagnosis present

## 2014-08-27 DIAGNOSIS — Y92122 Bedroom in nursing home as the place of occurrence of the external cause: Secondary | ICD-10-CM

## 2014-08-27 DIAGNOSIS — Z951 Presence of aortocoronary bypass graft: Secondary | ICD-10-CM

## 2014-08-27 DIAGNOSIS — I10 Essential (primary) hypertension: Secondary | ICD-10-CM | POA: Diagnosis present

## 2014-08-27 DIAGNOSIS — Z944 Liver transplant status: Secondary | ICD-10-CM

## 2014-08-27 DIAGNOSIS — K7460 Unspecified cirrhosis of liver: Secondary | ICD-10-CM | POA: Diagnosis present

## 2014-08-27 LAB — CBC WITH DIFFERENTIAL/PLATELET
Basophils Absolute: 0 10*3/uL (ref 0–0.1)
Basophils Relative: 0 %
EOS ABS: 0 10*3/uL (ref 0–0.7)
EOS PCT: 0 %
HCT: 29.5 % — ABNORMAL LOW (ref 35.0–47.0)
Hemoglobin: 9.7 g/dL — ABNORMAL LOW (ref 12.0–16.0)
LYMPHS ABS: 0.6 10*3/uL — AB (ref 1.0–3.6)
Lymphocytes Relative: 6 %
MCH: 26.8 pg (ref 26.0–34.0)
MCHC: 33.1 g/dL (ref 32.0–36.0)
MCV: 81 fL (ref 80.0–100.0)
MONO ABS: 0.8 10*3/uL (ref 0.2–0.9)
Monocytes Relative: 7 %
Neutro Abs: 9.4 10*3/uL — ABNORMAL HIGH (ref 1.4–6.5)
Neutrophils Relative %: 87 %
PLATELETS: 266 10*3/uL (ref 150–440)
RBC: 3.64 MIL/uL — AB (ref 3.80–5.20)
RDW: 15.2 % — ABNORMAL HIGH (ref 11.5–14.5)
WBC: 10.9 10*3/uL (ref 3.6–11.0)

## 2014-08-27 LAB — COMPREHENSIVE METABOLIC PANEL
ALK PHOS: 51 U/L (ref 38–126)
ALT: 14 U/L (ref 14–54)
AST: 20 U/L (ref 15–41)
Albumin: 3.4 g/dL — ABNORMAL LOW (ref 3.5–5.0)
Anion gap: 10 (ref 5–15)
BILIRUBIN TOTAL: 0.8 mg/dL (ref 0.3–1.2)
BUN: 12 mg/dL (ref 6–20)
CALCIUM: 7.8 mg/dL — AB (ref 8.9–10.3)
CO2: 26 mmol/L (ref 22–32)
CREATININE: 0.65 mg/dL (ref 0.44–1.00)
Chloride: 88 mmol/L — ABNORMAL LOW (ref 101–111)
GFR calc Af Amer: >60 mL/min (ref >60)
GFR calc non Af Amer: >60 mL/min (ref >60)
GLUCOSE: 93 mg/dL (ref 65–99)
POTASSIUM: 2.5 mmol/L — AB (ref 3.5–5.1)
Sodium: 124 mmol/L — ABNORMAL LOW (ref 135–145)
Total Protein: 5.9 g/dL — ABNORMAL LOW (ref 6.5–8.1)

## 2014-08-27 LAB — URINALYSIS COMPLETE WITH MICROSCOPIC (ARMC ONLY)
BILIRUBIN URINE: NEGATIVE
Glucose, UA: NEGATIVE mg/dL
Nitrite: NEGATIVE
PH: 6 (ref 5.0–8.0)
Protein, ur: 30 mg/dL — AB
Specific Gravity, Urine: 1.013 (ref 1.005–1.030)

## 2014-08-27 LAB — MAGNESIUM: MAGNESIUM: 1.9 mg/dL (ref 1.7–2.4)

## 2014-08-27 LAB — TSH: TSH: 1.449 u[IU]/mL (ref 0.350–4.500)

## 2014-08-27 MED ORDER — IBUPROFEN 400 MG PO TABS
400.0000 mg | ORAL_TABLET | Freq: Once | ORAL | Status: AC
  Administered 2014-08-27: 400 mg via ORAL

## 2014-08-27 MED ORDER — IBUPROFEN 400 MG PO TABS
ORAL_TABLET | ORAL | Status: AC
  Filled 2014-08-27: qty 1

## 2014-08-27 MED ORDER — OXYCODONE-ACETAMINOPHEN 5-325 MG PO TABS
1.0000 | ORAL_TABLET | Freq: Once | ORAL | Status: AC
  Administered 2014-08-27: 1 via ORAL

## 2014-08-27 MED ORDER — OXYCODONE-ACETAMINOPHEN 5-325 MG PO TABS
1.0000 | ORAL_TABLET | Freq: Once | ORAL | Status: AC
  Administered 2014-08-27 (×2): 1 via ORAL

## 2014-08-27 MED ORDER — SODIUM CHLORIDE 0.9 % IV BOLUS (SEPSIS)
1000.0000 mL | Freq: Once | INTRAVENOUS | Status: AC
  Administered 2014-08-27: 1000 mL via INTRAVENOUS

## 2014-08-27 MED ORDER — OXYCODONE-ACETAMINOPHEN 5-325 MG PO TABS
ORAL_TABLET | ORAL | Status: AC
  Filled 2014-08-27: qty 1

## 2014-08-27 MED ORDER — OXYCODONE-ACETAMINOPHEN 5-325 MG PO TABS
ORAL_TABLET | ORAL | Status: AC
  Administered 2014-08-27: 1 via ORAL
  Filled 2014-08-27: qty 1

## 2014-08-27 NOTE — ED Notes (Signed)
Deborah from social work in pt room at this time.

## 2014-08-27 NOTE — ED Notes (Signed)
Report called to [place] at [employer], Evelina Bucy, RN

## 2014-08-27 NOTE — Clinical Social Work Note (Signed)
Clinical Social Work Assessment  Patient Details  Name: Brenda Roman MRN: [id number] Date of Birth: 21-Mar-1924  Date of referral:  08/27/14               Reason for consult:  Facility Placement                Permission sought to share information with:  Family Supports Permission granted to share information::  Yes, Verbal Permission Granted  Name::     [name] - daughter [phone]          Agency::  [facility]  Relationship::  See above  Contact Information:  See above  Housing/Transportation Living arrangements for the past 2 months:  Single Family Home Source of Information:  Adult Children, Spouse Patient Interpreter Needed:  None Criminal Activity/Legal Involvement Pertinent to Current Situation/Hospitalization:  No - Comment as needed Significant Relationships:  Adult Children, Other Family Members, Spouse Lives with:  Spouse Do you feel safe going back to the place where you live?    Need for family participation in patient care:  Yes (Comment)  Care giving concerns:  Family concerned with taking patient home due to recent fall.   Family would like assist with rehab at SNF.    Social Worker assessment / plan:  Patient was recently discharged home with home health from [facility] on 08/25/14.  PT recommended SNF, however patient refused.  Patient reported to ED on 08/27/14 due to fall.  Patient lives with her husband of 25 years.  Informed CSW he is unable to care for patient at home.  Patient's husband and daughter at bedside.  States patient has been to [facility] in the the past and they would like her to return.  CSW will complete FL2, contact facility  and assist patient/ family with SNF placement from ED.    CSW has spoken with Maudie Mercury at [facility], Maudie Mercury has started insurance auth.   Employment status:  Retired Nurse, adult PT Recommendations:  [place] / Referral to community resources:  [place], Acute Rehab  Patient/Family's Response to care:  Patient and family is in agreement with SNF placement and is appreciative of support from [employer]  Patient/Family's Understanding of and Emotional Response to Diagnosis, Current Treatment, and Prognosis:  Family understands that patient will need rehab at [facility].  Emotional Assessment Appearance:  Appears stated age Attitude/Demeanor/Rapport:  Other (pleasant and smiling ) Affect (typically observed):  Accepting, Calm, Pleasant Orientation:  Oriented to Self Alcohol / Substance use:  Not Applicable Psych involvement (Current and /or in the community):  No (Comment)  Discharge Needs  Concerns to be addressed:  Discharge Planning Concerns, Home Safety Concerns, Adjustment to Illness, Care Coordination Readmission within the last 30 days:  Yes Current discharge risk:    Barriers to Discharge:  Omar, LCSW 08/27/2014, 10:26 AM

## 2014-08-27 NOTE — ED Notes (Signed)
Pt presents to ED via EMS from personal home with c/o of fall. EMS states pt was recently d/ from [facility] and has been weak since arrival to home. EMS states pt was located in bedroom when she accidentally rolled over and fell to the floor. EMS reports pt has left side shoulder/arm pain from accidental fall. EMS states pt is alert and oriented x3 at time of arrival to scene. EMS states pt has not notable deformities noted. Pt is alert and oriented x3 upon arrival to treatment room. Pt has no notably deformities upon physical assessment. VS per EMS listed below:   69 HR  177/73 BP

## 2014-08-27 NOTE — Clinical Social Work Note (Signed)
Clinical Social Work Assessment  Patient Details  Name: Ercel Normoyle MRN: [id number] Date of Birth: Jul 07, 1923  Date of referral:  08/27/14               Reason for consult:                   Permission sought to share information with:    Permission granted to share information::     Name::        Agency::     Relationship::     Contact Information:     Housing/Transportation Living arrangements for the past 2 months:    Source of Information:    Patient Interpreter Needed:    Criminal Activity/Legal Involvement Pertinent to Current Situation/Hospitalization:    Significant Relationships:    Lives with:    Do you feel safe going back to the place where you live?    Need for family participation in patient care:     Care giving concerns:    Facilities manager / plan:    Employment status:    Insurance information:    PT Recommendations:    Information / Referral to community resources:     Patient/Family's Response to care:   Patient/Family's Understanding of and Emotional Response to Diagnosis, Current Treatment, and Prognosis:    Emotional Assessment Appearance:    Attitude/Demeanor/Rapport:    Affect (typically observed):    Orientation:    Alcohol / Substance use:    Psych involvement (Current and /or in the community):     Discharge Needs  Concerns to be addressed:    Readmission within the last 30 days:    Current discharge risk:    Barriers to Discharge:      Maurine Cane, LCSW 08/27/2014, 10:05 AM

## 2014-08-27 NOTE — ED Provider Notes (Signed)
Imaging: Reviewed results proximal left humeral fracture  Clinical impression: Assumed care from Dr. Owens Shark. Patient has social work consult for nursing home placement. I did discuss with orthopedic surgeon over the phone who reviewed x-ray and recommended 10-14 day outpatient follow-up and for patient to wear sling. Patient was seen by physical therapy in the emergency Department who recommended both physical therapy and occupational therapy in the nursing home setting. I updated the family and the patient  Diagnosis: Closed left proximal humeral fracture acute Ataxia/generalized weakness  Lisa Roca, MD 08/27/14 1427

## 2014-08-27 NOTE — Clinical Social Work Placement (Signed)
   CLINICAL SOCIAL WORK PLACEMENT  NOTE  Date:  08/27/2014  Patient Details  Name: Brenda Roman MRN: [id number] Date of Birth: 1924/02/15  Clinical Social Work is seeking post-discharge placement for this patient at the [place] level of care (*CSW will initial, date and re-position this form in  chart as items are completed):  Yes   Patient/family provided with [place] Work Department's list of facilities offering this level of care within the geographic area requested by the patient (or if unable, by the patient's family).  Yes   Patient/family informed of their freedom to choose among providers that offer the needed level of care, that participate in Medicare, Medicaid or managed care program needed by the patient, have an available bed and are willing to accept the patient.  Yes   Patient/family informed of [place]'s ownership interest in [facility] and [facility], as well as of the fact that they are under no obligation to receive care at these facilities.  PASRR submitted to EDS on       PASRR number received on       Existing PASRR number confirmed on 08/27/14     FL2 transmitted to all facilities in geographic area requested by pt/family on 08/27/14     FL2 transmitted to all facilities within larger geographic area on       Patient informed that his/her managed care company has contracts with or will negotiate with certain facilities, including the following:        Yes   Patient/family informed of bed offers received.  Patient chooses bed at  [facility])     Physician recommends and patient chooses bed at      Patient to be transferred to  [facility]) on 08/27/14.  Patient to be transferred to facility by  (EMS)     Patient family notified on 08/27/14 of transfer.  Name of family member notified:   (Husband - [name])     PHYSICIAN Please prepare priority discharge summary, including medications      Additional Comment:    _______________________________________________ Maurine Cane, LCSW 08/27/2014, 2:35 PM

## 2014-08-27 NOTE — ED Notes (Signed)
Pt given food and drink and pt repositioned Kabria Hetzer, Evelina Bucy, RN

## 2014-08-27 NOTE — Discharge Instructions (Signed)
Wear sling for comfort  Return to emergency department for new or worsening pain Altered mental status Weakness or numbness or any other symptoms concerning to you

## 2014-08-27 NOTE — Progress Notes (Signed)
Call from [employer] rep. Beth [phone] Ext [phone]. States she will need new PT notes on patient.  However she is able to take a verbal update of patient over the phone.  Call to PT they will come evaluated patient this afternoon.   CSW will continue to follow patient to assist with placement.  Casimer Lanius. Latanya Presser, MSW Clinical Social Work Department Emergency Room [phone] 12:25 PM

## 2014-08-27 NOTE — Evaluation (Signed)
Physical Therapy Evaluation Patient Details Name: Brenda Roman MRN: [id number] DOB: [birth year] Today's Date: 08/27/2014   History of Present Illness  patient presented to ER (5/2) secondary to severe headache X3 days with generalized weakness and slurred speech; admitted for CVA work-up.  Head CT/MRI negative for acute change.  Required min/mod assist +1 with all mobility per prior PT evaluation. Recommended STR upon discharge, but family declined and preferred HHPT.  Returns to ER 5/6 after patient fall from bed, sustaining L humeral fracture (immobililized and NWB).  Clinical Impression  Upon evaluation, patient alert and oriented to basic information.  Generally weak and deconditioned; poor sitting/standing balance.  Currently requiring mod/max assist +2 for all functional activities and unable to tolerate gait beyond 1 step forward/backward.  Unsafe to return home at this time. Would benefit from skilled PT to address above deficits and promote optimal return to PLOF ;recommend transition to STR upon discharge from acute hospitalization. Patient/family aware of recommendations and in agreement with plan.     Follow Up Recommendations SNF    Equipment Recommendations  Other (comment) (TBD at [facility])    Recommendations for Other Services       Precautions / Restrictions Precautions Precautions: Fall Restrictions Weight Bearing Restrictions: Yes LUE Weight Bearing: Non weight bearing      Mobility  Bed Mobility Overal bed mobility: Needs Assistance Bed Mobility: Supine to Sit;Sit to Supine     Supine to sit: Mod assist;Max assist Sit to supine: Max assist   General bed mobility comments: assist for LE management and truncal elevation  Transfers Overall transfer level: Needs assistance Equipment used:  (R HHA and assist from second person) Transfers: Sit to/from Stand Sit to Stand: Mod assist;+2 physical assistance         General transfer comment: assist for lift off,  dynamic balance  Ambulation/Gait Ambulation/Gait assistance: Mod assist;Max assist;+2 physical assistance Ambulation Distance (Feet): 1 Feet Assistive device:  (R HHA, second person physical assist)       General Gait Details: single step forward/backward; poor balance.  Unable to tolerate additional activity due to fatigue and nausea with transition to upright (vitals stable and WFL)  Stairs            Wheelchair Mobility    Modified Rankin (Stroke Patients Only)       Balance Overall balance assessment: Needs assistance Sitting-balance support: No upper extremity supported Sitting balance-Leahy Scale: Poor   Postural control: Posterior lean Standing balance support: Single extremity supported Standing balance-Leahy Scale: Poor Standing balance comment: +2 assist required at all times                             Pertinent Vitals/Pain Pain Assessment:  ("no pain as long as I lay here still")    Home Living Family/patient expects to be discharged to:: Private residence Living Arrangements: Spouse/significant other Available Help at Discharge: Family Type of Home: House Home Access: Stairs to enter Entrance Stairs-Rails: Right Entrance Stairs-Number of Steps: 6 Home Layout: Able to live on main level with bedroom/bathroom (main level with basement; no essential needs in basement) Home Equipment: Bedside commode;Hospital bed;Wheelchair - manual      Prior Function Level of Independence: Independent (progressive decline over recent week prior to admission)         Comments: since discharge from hospital 5/4, has been limited to basic transfers only, requiring approx 80-90% assist from family member to complete (per daughter's estimate)  Hand Dominance   Dominant Hand: Right    Extremity/Trunk Assessment   Upper Extremity Assessment: Generalized weakness (R UE strength and ROM grossly WFL; L LE immobilized in sling)           Lower  Extremity Assessment: Generalized weakness (bilat LEs at least 4/5 throughout)         Communication   Communication: HOH (minimal slurring; no apparent word-finding difficulties)  Cognition Arousal/Alertness: Awake/alert Behavior During Therapy: WFL for tasks assessed/performed Overall Cognitive Status: Within Functional Limits for tasks assessed                      General Comments General comments (skin integrity, edema, etc.): R UE immobilized in sling    Exercises        Assessment/Plan    PT Assessment Patient needs continued PT services  PT Diagnosis Difficulty walking;Generalized weakness;Acute pain   PT Problem List Decreased strength;Decreased range of motion;Decreased activity tolerance;Decreased mobility;Decreased balance;Decreased coordination;Decreased knowledge of use of DME;Decreased safety awareness;Decreased knowledge of precautions  PT Treatment Interventions Gait training;Functional mobility training;Therapeutic activities;Balance training;Patient/family education   PT Goals (Current goals can be found in the Care Plan section) Acute Rehab PT Goals Patient Stated Goal: to go to STR this time PT Goal Formulation: With patient Time For Goal Achievement: 09/10/14 Potential to Achieve Goals: Good    Frequency Min 2X/week   Barriers to discharge Inaccessible home environment;Decreased caregiver support      Co-evaluation               End of Session   Activity Tolerance: Patient limited by fatigue Patient left: in bed;with family/visitor present      Functional Limitation: Mobility: Walking and moving around Mobility: Walking and Moving Around Current Status (P7106): At least 80 percent but less than 100 percent impaired, limited or restricted Mobility: Walking and Moving Around Goal Status [phone]): At least 1 percent but less than 20 percent impaired, limited or restricted    Time: 5462-7035 PT Time Calculation (min) (ACUTE ONLY):  12 min   Charges:   PT Evaluation $Initial PT Evaluation Tier I: 1 Procedure     PT G Codes:   PT G-Codes **NOT FOR INPATIENT CLASS** Functional Limitation: Mobility: Walking and moving around Mobility: Walking and Moving Around Current Status (K0938): At least 80 percent but less than 100 percent impaired, limited or restricted Mobility: Walking and Moving Around Goal Status [phone]): At least 1 percent but less than 20 percent impaired, limited or restricted    Ellison Hughs 08/27/2014, 3:25 PM  Ellina Sivertsen H. Owens Shark, PT, DPT 08/27/2014 3:28 PM [phone]

## 2014-08-27 NOTE — ED Notes (Signed)
Patient back from  X-ray 

## 2014-08-27 NOTE — ED Notes (Signed)
Patient transported to X-ray 

## 2014-08-27 NOTE — Clinical Social Work Placement (Signed)
   CLINICAL SOCIAL WORK PLACEMENT  NOTE  Date:  08/27/2014  Patient Details  Name: Brenda Roman MRN: [id number] Date of Birth: 17-Jul-1923  Clinical Social Work is seeking post-discharge placement for this patient at the [place] level of care (*CSW will initial, date and re-position this form in  chart as items are completed):  Yes   Patient/family provided with [place] Work Department's list of facilities offering this level of care within the geographic area requested by the patient (or if unable, by the patient's family).  Yes   Patient/family informed of their freedom to choose among providers that offer the needed level of care, that participate in Medicare, Medicaid or managed care program needed by the patient, have an available bed and are willing to accept the patient.  Yes   Patient/family informed of [place]'s ownership interest in [facility] and [facility], as well as of the fact that they are under no obligation to receive care at these facilities.  PASRR submitted to EDS on       PASRR number received on       Existing PASRR number confirmed on 08/27/14     FL2 transmitted to all facilities in geographic area requested by pt/family on 08/27/14     FL2 transmitted to all facilities within larger geographic area on       Patient informed that his/her managed care company has contracts with or will negotiate with certain facilities, including the following:            Patient/family informed of bed offers received.  Patient chooses bed at       Physician recommends and patient chooses bed at      Patient to be transferred to   on  .  Patient to be transferred to facility by       Patient family notified on   of transfer.  Name of family member notified:        PHYSICIAN Please sign FL2     Additional Comment:    _______________________________________________ Maurine Cane, LCSW 08/27/2014, 10:38 AM

## 2014-08-27 NOTE — Progress Notes (Signed)
08/27/2014  Per MD patient is medically ready to leave ED.  Patient and family have accepted bed offer from [facility] and excited for patient to go for rehab.  CWS has obtained insurance Josem Kaufmann, MD signed Phoebe Perch, and patient's med list. CSW faxed all discharge papers to facility.  RN called report  [phone] for patient to go to room 211 and EMS will transport patient to facility. CSW signing off, no additional CSW services needed at this time.  Family and patient appreciative of services provided.   Casimer Lanius. Latanya Presser, MSW Clinical Social Work Department Emergency Room [phone] 3:21 PM

## 2014-08-27 NOTE — ED Notes (Signed)
PT at bedside working with pt Abia Monaco, Evelina Bucy, RN

## 2014-08-27 NOTE — ED Provider Notes (Signed)
[facility] Emergency Department Provider Note    ____________________________________________  Time seen: 4:20 AM  I have reviewed the triage vital signs and the nursing notes.   HISTORY  Chief Complaint Fall       HPI Brenda Roman a 81 y.o. female presents with history of accidentally falling out of bed. Patient complains of left upper arm pain. Patient denies any head trauma. Current pain 7 out of 10.     Past Medical History  Diagnosis Date  . Liver transplanted   . Macular degeneration   . Coronary artery disease     Patient Active Problem List   Diagnosis Date Noted  . CVA (cerebral infarction) 08/23/2014    Past Surgical History  Procedure Laterality Date  . Coronary artery bypass graft      Current Outpatient Rx  Name  Route  Sig  Dispense  Refill  . aspirin EC 81 MG tablet   Oral   Take 81 mg by mouth 2 (two) times daily.          . beta carotene w/minerals (OCUVITE) tablet   Oral   Take 1 tablet by mouth 2 (two) times daily.         Marland Kitchen CALCIUM CARBONATE PO   Oral   Take 1 tablet by mouth daily.         . Cholecalciferol (VITAMIN D3) 1000 UNITS CAPS   Oral   Take 2 capsules by mouth daily.         Marland Kitchen ezetimibe-simvastatin (VYTORIN) 10-20 MG per tablet   Oral   Take 1 tablet by mouth at bedtime.         . metoprolol succinate (TOPROL-XL) 100 MG 24 hr tablet   Oral   Take 100 mg by mouth every morning.         . metoprolol succinate (TOPROL-XL) 50 MG 24 hr tablet   Oral   Take 50 mg by mouth at bedtime.          . metoprolol tartrate (LOPRESSOR) 25 MG tablet   Oral   Take 25 mg by mouth 2 (two) times daily as needed (for palpitations).         . mycophenolate (CELLCEPT) 500 MG tablet   Oral   Take 500 mg by mouth every 12 (twelve) hours.         . nitroGLYCERIN (NITROSTAT) 0.4 MG SL tablet   Sublingual   Place 0.4 mg under the tongue every 5 (five) minutes as needed for chest pain.         . predniSONE (DELTASONE) 5 MG tablet   Oral   Take 5 mg by mouth daily.         . rivaroxaban (XARELTO) 20 MG TABS tablet   Oral   Take 20 mg by mouth daily with supper.         . zolpidem (AMBIEN) 5 MG tablet   Oral   Take 5 mg by mouth at bedtime.           Allergies Cephalexin; Novocain; and Sulfa antibiotics  History reviewed. No pertinent family history.  Social History History  Substance Use Topics  . Smoking status: Former Research scientist (life sciences)  . Smokeless tobacco: Not on file  . Alcohol Use: No    Review of Systems  Constitutional: Negative for fever. Eyes: Negative for visual changes. ENT: Negative for sore throat. Cardiovascular: Negative for chest pain. Respiratory: Negative for shortness of breath. Gastrointestinal: Negative for abdominal pain,  vomiting and diarrhea. Genitourinary: Negative for dysuria. Musculoskeletal: Positive for proximal left arm pain. Skin: Negative for rash. Neurological: Negative for headaches, focal weakness or numbness.   10-point ROS otherwise negative.  ____________________________________________   PHYSICAL EXAM:  VITAL SIGNS: ED Triage Vitals  Enc Vitals Group     BP 08/27/14 0428 165/75 mmHg     Pulse Rate 08/27/14 0428 63     Resp 08/27/14 0428 17     Temp 08/27/14 0428 98.7 F (37.1 C)     Temp Source 08/27/14 0428 Oral     SpO2 08/27/14 0428 93 %     Weight 08/27/14 0420 135 lb (61.236 kg)     Height 08/27/14 0420 5\' 1"  (1.549 m)     Head Cir --      Peak Flow --      Pain Score 08/27/14 0422 3     Pain Loc --      Pain Edu? --      Excl. in [place]? --      Constitutional: Alert and oriented. Well appearing and in no distress. Eyes: Conjunctivae are normal. PERRL. Normal extraocular movements. ENT   Head: Normocephalic and atraumatic.   Nose: No congestion/rhinnorhea.   Mouth/Throat: Mucous membranes are moist.   Neck: No stridor. Hematological/Lymphatic/Immunilogical: No cervical  lymphadenopathy. Cardiovascular: Normal rate, regular rhythm. Normal and symmetric distal pulses are present in all extremities. No murmurs, rubs, or gallops. Respiratory: Normal respiratory effort without tachypnea nor retractions. Breath sounds are clear and equal bilaterally. No wheezes/rales/rhonchi. Gastrointestinal: Soft and nontender. No distention. No abdominal bruits. There is no CVA tenderness. Genitourinary: Deferred Musculoskeletal: Pain with palpation of the proximal left humerus. No joint effusions.  No lower extremity tenderness nor edema. Neurologic:  Normal speech and language. No gross focal neurologic deficits are appreciated. Speech is normal. No gait instability. Skin:  Skin is warm, dry and intact. No rash noted. Psychiatric: Mood and affect are normal. Speech and behavior are normal. Patient exhibits appropriate insight and judgment.  ____________________________________________    LABS (pertinent positives/negatives)  None performed  ____________________________________________   EKG  None performed ____________________________________________    RADIOLOGY  Acute left proximal humerus fracture   ____________________________________________   PROCEDURES       ____________________________________________   INITIAL IMPRESSION / ASSESSMENT AND PLAN / ED COURSE  Pertinent labs & imaging results that were available during my care of the patient were reviewed by me and considered in my medical decision making (see chart for details).  Patient received Percocet 5 mg by mouth x-ray confirmed acute proximal left humerus fracture as such sling applied. Patient's husband and daughter at bedside requesting admission due to concern of patient's immobility inability to walk. Concerned in regards to how to get the patient on and off the commode etc  ____________________________________________   FINAL CLINICAL IMPRESSION(S) / ED DIAGNOSES  Final  diagnoses:  Proximal humerus fracture, left, closed, initial encounter     Gregor Hams, MD 08/28/14 [phone]

## 2014-08-28 LAB — CULTURE, BLOOD (ROUTINE X 2)
CULTURE: NO GROWTH
Culture: NO GROWTH

## 2014-08-28 LAB — SEDIMENTATION RATE: SED RATE: 13 mm/h (ref 0–30)

## 2014-08-29 ENCOUNTER — Inpatient Hospital Stay
Admission: EM | Admit: 2014-08-29 | Discharge: 2014-09-05 | DRG: 193 | Disposition: A | Discharge status: Skilled Nursing Facility | Payer: Medicare Other | Attending: Internal Medicine | Admitting: Internal Medicine

## 2014-08-29 ENCOUNTER — Other Ambulatory Visit
Admit: 2014-08-29 | Discharge: 2014-08-29 | Disposition: A | Discharge status: Home / Self Care | Payer: Medicare Other | Source: Skilled Nursing Facility | Attending: Internal Medicine | Admitting: Internal Medicine

## 2014-08-29 ENCOUNTER — Encounter: Payer: Self-pay | Admitting: Emergency Medicine

## 2014-08-29 ENCOUNTER — Emergency Department: Payer: Medicare Other

## 2014-08-29 DIAGNOSIS — R627 Adult failure to thrive: Secondary | ICD-10-CM | POA: Diagnosis present

## 2014-08-29 DIAGNOSIS — E871 Hypo-osmolality and hyponatremia: Secondary | ICD-10-CM | POA: Diagnosis not present

## 2014-08-29 DIAGNOSIS — G9341 Metabolic encephalopathy: Secondary | ICD-10-CM | POA: Diagnosis present

## 2014-08-29 DIAGNOSIS — F329 Major depressive disorder, single episode, unspecified: Secondary | ICD-10-CM | POA: Diagnosis present

## 2014-08-29 DIAGNOSIS — J189 Pneumonia, unspecified organism: Secondary | ICD-10-CM | POA: Diagnosis present

## 2014-08-29 DIAGNOSIS — Z7189 Other specified counseling: Secondary | ICD-10-CM | POA: Insufficient documentation

## 2014-08-29 DIAGNOSIS — R4182 Altered mental status, unspecified: Secondary | ICD-10-CM

## 2014-08-29 DIAGNOSIS — F0390 Unspecified dementia without behavioral disturbance: Secondary | ICD-10-CM | POA: Diagnosis present

## 2014-08-29 DIAGNOSIS — Z452 Encounter for adjustment and management of vascular access device: Secondary | ICD-10-CM

## 2014-08-29 DIAGNOSIS — Z79899 Other long term (current) drug therapy: Secondary | ICD-10-CM | POA: Diagnosis not present

## 2014-08-29 DIAGNOSIS — E860 Dehydration: Secondary | ICD-10-CM | POA: Diagnosis present

## 2014-08-29 DIAGNOSIS — R0902 Hypoxemia: Secondary | ICD-10-CM

## 2014-08-29 DIAGNOSIS — R531 Weakness: Secondary | ICD-10-CM | POA: Insufficient documentation

## 2014-08-29 DIAGNOSIS — Z87891 Personal history of nicotine dependence: Secondary | ICD-10-CM | POA: Diagnosis not present

## 2014-08-29 DIAGNOSIS — Z951 Presence of aortocoronary bypass graft: Secondary | ICD-10-CM | POA: Diagnosis not present

## 2014-08-29 DIAGNOSIS — I482 Chronic atrial fibrillation: Secondary | ICD-10-CM | POA: Diagnosis present

## 2014-08-29 DIAGNOSIS — Z7982 Long term (current) use of aspirin: Secondary | ICD-10-CM | POA: Diagnosis not present

## 2014-08-29 DIAGNOSIS — I776 Arteritis, unspecified: Secondary | ICD-10-CM | POA: Diagnosis present

## 2014-08-29 DIAGNOSIS — Y95 Nosocomial condition: Secondary | ICD-10-CM | POA: Diagnosis present

## 2014-08-29 DIAGNOSIS — R296 Repeated falls: Secondary | ICD-10-CM | POA: Insufficient documentation

## 2014-08-29 DIAGNOSIS — Z888 Allergy status to other drugs, medicaments and biological substances status: Secondary | ICD-10-CM | POA: Diagnosis not present

## 2014-08-29 DIAGNOSIS — Z944 Liver transplant status: Secondary | ICD-10-CM | POA: Diagnosis not present

## 2014-08-29 DIAGNOSIS — R410 Disorientation, unspecified: Secondary | ICD-10-CM | POA: Diagnosis not present

## 2014-08-29 DIAGNOSIS — Z882 Allergy status to sulfonamides status: Secondary | ICD-10-CM | POA: Diagnosis not present

## 2014-08-29 DIAGNOSIS — I10 Essential (primary) hypertension: Secondary | ICD-10-CM | POA: Diagnosis present

## 2014-08-29 DIAGNOSIS — H3530 Unspecified macular degeneration: Secondary | ICD-10-CM | POA: Diagnosis present

## 2014-08-29 DIAGNOSIS — M1990 Unspecified osteoarthritis, unspecified site: Secondary | ICD-10-CM | POA: Diagnosis present

## 2014-08-29 DIAGNOSIS — I2510 Atherosclerotic heart disease of native coronary artery without angina pectoris: Secondary | ICD-10-CM | POA: Diagnosis present

## 2014-08-29 DIAGNOSIS — G9340 Encephalopathy, unspecified: Secondary | ICD-10-CM | POA: Diagnosis present

## 2014-08-29 DIAGNOSIS — Z515 Encounter for palliative care: Secondary | ICD-10-CM | POA: Diagnosis not present

## 2014-08-29 DIAGNOSIS — E222 Syndrome of inappropriate secretion of antidiuretic hormone: Secondary | ICD-10-CM | POA: Diagnosis present

## 2014-08-29 DIAGNOSIS — K7460 Unspecified cirrhosis of liver: Secondary | ICD-10-CM | POA: Diagnosis present

## 2014-08-29 DIAGNOSIS — Z8673 Personal history of transient ischemic attack (TIA), and cerebral infarction without residual deficits: Secondary | ICD-10-CM | POA: Diagnosis not present

## 2014-08-29 DIAGNOSIS — S42302D Unspecified fracture of shaft of humerus, left arm, subsequent encounter for fracture with routine healing: Secondary | ICD-10-CM | POA: Diagnosis not present

## 2014-08-29 DIAGNOSIS — I252 Old myocardial infarction: Secondary | ICD-10-CM | POA: Diagnosis not present

## 2014-08-29 DIAGNOSIS — Z66 Do not resuscitate: Secondary | ICD-10-CM | POA: Diagnosis present

## 2014-08-29 HISTORY — DX: Essential (primary) hypertension: I10

## 2014-08-29 LAB — COMPREHENSIVE METABOLIC PANEL
ALT: 12 U/L — ABNORMAL LOW (ref 14–54)
ANION GAP: 8 (ref 5–15)
AST: 18 U/L (ref 15–41)
Albumin: 3.2 g/dL — ABNORMAL LOW (ref 3.5–5.0)
Alkaline Phosphatase: 59 U/L (ref 38–126)
BILIRUBIN TOTAL: 0.5 mg/dL (ref 0.3–1.2)
BUN: 9 mg/dL (ref 6–20)
CO2: 27 mmol/L (ref 22–32)
CREATININE: 0.58 mg/dL (ref 0.44–1.00)
Calcium: 8 mg/dL — ABNORMAL LOW (ref 8.9–10.3)
Chloride: 90 mmol/L — ABNORMAL LOW (ref 101–111)
GFR calc Af Amer: >60 mL/min (ref >60)
GFR calc non Af Amer: >60 mL/min (ref >60)
Glucose, Bld: 130 mg/dL — ABNORMAL HIGH (ref 65–99)
Potassium: 5 mmol/L (ref 3.5–5.1)
Sodium: 125 mmol/L — ABNORMAL LOW (ref 135–145)
Total Protein: 6.1 g/dL — ABNORMAL LOW (ref 6.5–8.1)

## 2014-08-29 LAB — BASIC METABOLIC PANEL
ANION GAP: 8 (ref 5–15)
BUN: 8 mg/dL (ref 6–20)
CHLORIDE: 97 mmol/L — AB (ref 101–111)
CO2: 23 mmol/L (ref 22–32)
Calcium: 7.1 mg/dL — ABNORMAL LOW (ref 8.9–10.3)
Creatinine, Ser: 0.49 mg/dL (ref 0.44–1.00)
GFR calc non Af Amer: >60 mL/min (ref >60)
Glucose, Bld: 82 mg/dL (ref 65–99)
POTASSIUM: 4 mmol/L (ref 3.5–5.1)
Sodium: 128 mmol/L — ABNORMAL LOW (ref 135–145)

## 2014-08-29 LAB — CBC
HCT: 31.9 % — ABNORMAL LOW (ref 35.0–47.0)
Hemoglobin: 10.1 g/dL — ABNORMAL LOW (ref 12.0–16.0)
MCH: 25.6 pg — ABNORMAL LOW (ref 26.0–34.0)
MCHC: 31.6 g/dL — ABNORMAL LOW (ref 32.0–36.0)
MCV: 81 fL (ref 80.0–100.0)
Platelets: 351 10*3/uL (ref 150–440)
RBC: 3.94 MIL/uL (ref 3.80–5.20)
RDW: 16.1 % — ABNORMAL HIGH (ref 11.5–14.5)
WBC: 11.7 10*3/uL — ABNORMAL HIGH (ref 3.6–11.0)

## 2014-08-29 LAB — URINE CULTURE

## 2014-08-29 MED ORDER — METOPROLOL SUCCINATE ER 50 MG PO TB24
50.0000 mg | ORAL_TABLET | Freq: Two times a day (BID) | ORAL | Status: DC
  Administered 2014-08-29 – 2014-09-05 (×13): 50 mg via ORAL
  Filled 2014-08-29 (×12): qty 1

## 2014-08-29 MED ORDER — RIVAROXABAN 20 MG PO TABS
20.0000 mg | ORAL_TABLET | Freq: Every day | ORAL | Status: DC
  Administered 2014-08-30 – 2014-08-31 (×2): 20 mg via ORAL
  Filled 2014-08-29 (×3): qty 1

## 2014-08-29 MED ORDER — PREDNISONE 5 MG PO TABS
5.0000 mg | ORAL_TABLET | Freq: Every day | ORAL | Status: DC
  Administered 2014-08-29 – 2014-09-05 (×8): 5 mg via ORAL
  Filled 2014-08-29 (×11): qty 1

## 2014-08-29 MED ORDER — SODIUM CHLORIDE 0.9 % IV SOLN
INTRAVENOUS | Status: DC
  Administered 2014-08-30 – 2014-09-01 (×4): via INTRAVENOUS

## 2014-08-29 MED ORDER — SENNOSIDES-DOCUSATE SODIUM 8.6-50 MG PO TABS
1.0000 | ORAL_TABLET | Freq: Every evening | ORAL | Status: DC | PRN
  Administered 2014-08-29: 1 via ORAL
  Filled 2014-08-29: qty 1

## 2014-08-29 MED ORDER — ALBUTEROL SULFATE (2.5 MG/3ML) 0.083% IN NEBU: 2.5000 mg | INHALATION_SOLUTION | RESPIRATORY_TRACT | Status: DC | PRN

## 2014-08-29 MED ORDER — LEVOFLOXACIN IN D5W 500 MG/100ML IV SOLN
INTRAVENOUS | Status: AC
  Filled 2014-08-29: qty 100

## 2014-08-29 MED ORDER — ASPIRIN EC 81 MG PO TBEC
81.0000 mg | DELAYED_RELEASE_TABLET | Freq: Two times a day (BID) | ORAL | Status: DC
  Administered 2014-08-29 – 2014-09-05 (×13): 81 mg via ORAL
  Filled 2014-08-29 (×13): qty 1

## 2014-08-29 MED ORDER — METOPROLOL SUCCINATE ER 50 MG PO TB24: 50.0000 mg | ORAL_TABLET | Freq: Two times a day (BID) | ORAL | Status: DC

## 2014-08-29 MED ORDER — ONDANSETRON HCL 4 MG/2ML IJ SOLN
4.0000 mg | Freq: Four times a day (QID) | INTRAMUSCULAR | Status: DC | PRN
  Administered 2014-08-31: 4 mg via INTRAVENOUS
  Filled 2014-08-29: qty 2

## 2014-08-29 MED ORDER — NITROGLYCERIN 0.4 MG SL SUBL: 0.4000 mg | SUBLINGUAL_TABLET | SUBLINGUAL | Status: DC | PRN

## 2014-08-29 MED ORDER — SODIUM CHLORIDE 0.9 % IV SOLN
Freq: Once | INTRAVENOUS | Status: AC
  Administered 2014-08-29: 18:00:00 via INTRAVENOUS

## 2014-08-29 MED ORDER — LEVOFLOXACIN IN D5W 500 MG/100ML IV SOLN
500.0000 mg | INTRAVENOUS | Status: DC
  Administered 2014-08-29 – 2014-08-30 (×2): 500 mg via INTRAVENOUS
  Filled 2014-08-29 (×3): qty 100

## 2014-08-29 MED ORDER — ACETAMINOPHEN 325 MG PO TABS
650.0000 mg | ORAL_TABLET | Freq: Four times a day (QID) | ORAL | Status: DC | PRN
  Administered 2014-08-29 – 2014-08-30 (×2): 650 mg via ORAL
  Administered 2014-08-30: 325 mg via ORAL
  Administered 2014-08-31 – 2014-09-02 (×7): 650 mg via ORAL
  Filled 2014-08-29 (×9): qty 2

## 2014-08-29 MED ORDER — HEPARIN SODIUM (PORCINE) 5000 UNIT/ML IJ SOLN: 5000.0000 [IU] | Freq: Three times a day (TID) | INTRAMUSCULAR | Status: DC

## 2014-08-29 MED ORDER — ONDANSETRON HCL 4 MG PO TABS: 4.0000 mg | ORAL_TABLET | Freq: Four times a day (QID) | ORAL | Status: DC | PRN

## 2014-08-29 MED ORDER — ACETAMINOPHEN 650 MG RE SUPP: 650.0000 mg | Freq: Four times a day (QID) | RECTAL | Status: DC | PRN

## 2014-08-29 NOTE — ED Notes (Signed)
Report given to floor - clothing and ring with pt,.

## 2014-08-29 NOTE — ED Provider Notes (Signed)
[facility] Emergency Department Provider Note    ____________________________________________  Time seen: 1650  I have reviewed the triage vital signs and the nursing notes.   HISTORY  Chief Complaint Shortness of Breath   History limited by: Altered Mental Status   HPI Brenda Roman is a 81 y.o. female who presents from rehabilitation today because of concerns for pneumonia. A shunt was at rehabilitation facility after a recent fall and left humerus fracture. Per family patient has not been doing well for the past 10 days declining both mental status as well as health. This started the ten-day she was treated for UTI by her primary care doctor. Per report outside radiologist read a perihilar pneumonia and patient was hypoxic. Unfortunately patient is altered and not able to give any history or review of systems.  Past Medical History  Diagnosis Date  . Liver transplanted   . Macular degeneration   . Coronary artery disease   . Hypertension     Patient Active Problem List   Diagnosis Date Noted  . CVA (cerebral infarction) 08/23/2014    Past Surgical History  Procedure Laterality Date  . Coronary artery bypass graft      Current Outpatient Rx  Name  Route  Sig  Dispense  Refill  . aspirin EC 81 MG tablet   Oral   Take 81 mg by mouth 2 (two) times daily.          . beta carotene w/minerals (OCUVITE) tablet   Oral   Take 1 tablet by mouth 2 (two) times daily.         Marland Kitchen CALCIUM CARBONATE PO   Oral   Take 1 tablet by mouth daily.         . Cholecalciferol (VITAMIN D3) 1000 UNITS CAPS   Oral   Take 2 capsules by mouth daily.         Marland Kitchen ezetimibe-simvastatin (VYTORIN) 10-20 MG per tablet   Oral   Take 1 tablet by mouth at bedtime.         . metoprolol succinate (TOPROL-XL) 50 MG 24 hr tablet   Oral   Take 50-100 mg by mouth 2 (two) times daily. Pt takes 2 tabs (100mg ) every morning, and 1 tab (50mg ) every evening          . mycophenolate (CELLCEPT) 500 MG tablet   Oral   Take 500 mg by mouth every 12 (twelve) hours.         . nitroGLYCERIN (NITROSTAT) 0.4 MG SL tablet   Sublingual   Place 0.4 mg under the tongue every 5 (five) minutes as needed for chest pain.         Marland Kitchen ondansetron (ZOFRAN) 4 MG tablet   Oral   Take 4 mg by mouth every 8 (eight) hours as needed for nausea or vomiting.         . predniSONE (DELTASONE) 5 MG tablet   Oral   Take 5 mg by mouth daily.         . rivaroxaban (XARELTO) 20 MG TABS tablet   Oral   Take 20 mg by mouth daily with supper.         . zolpidem (AMBIEN) 5 MG tablet   Oral   Take 5 mg by mouth at bedtime.           Allergies Cephalexin; Novocain; and Sulfa antibiotics  No family history on file.  Social History History  Substance Use Topics  . Smoking  status: Former Research scientist (life sciences)  . Smokeless tobacco: Not on file  . Alcohol Use: No    Review of Systems  Unable to obtain secondary to altered mental status  ____________________________________________   PHYSICAL EXAM:   Constitutional: Awake and alert, chronically ill-appearing Eyes: Conjunctivae are normal. PERRL.  ENT   Head: Normocephalic and atraumatic.   Nose: No congestion/rhinnorhea.   Mouth/Throat: Mucous membranes are moist.   Neck: No stridor. Hematological/Lymphatic/Immunilogical: No cervical lymphadenopathy. Cardiovascular: Normal rate, regular rhythm.  No murmurs, rubs, or gallops. Respiratory: Normal respiratory effort without tachypnea nor retractions. Breath sounds are clear and equal bilaterally. No wheezes/rales/rhonchi. Gastrointestinal: Soft and nontender. No distention.  Genitourinary: Deferred Musculoskeletal: Normal range of motion in all extremities. No joint effusions.  No lower extremity tenderness nor edema. Neurologic:  Altered mental status Skin:  Skin is warm, dry and intact. No rash noted.  ____________________________________________     LABS (pertinent positives/negatives)  Labs Reviewed  CBC - Abnormal; Notable for the following:    WBC 11.7 (*)    Hemoglobin 10.1 (*)    HCT 31.9 (*)    MCH 25.6 (*)    MCHC 31.6 (*)    RDW 16.1 (*)    All other components within normal limits  COMPREHENSIVE METABOLIC PANEL - Abnormal; Notable for the following:    Sodium 125 (*)    Chloride 90 (*)    Glucose, Bld 130 (*)    Calcium 8.0 (*)    Total Protein 6.1 (*)    Albumin 3.2 (*)    ALT 12 (*)    All other components within normal limits  CULTURE, BLOOD (ROUTINE X 2)  CULTURE, BLOOD (ROUTINE X 2)     ____________________________________________   EKG  EKG Time: 1558 Rate: 87 Rhythm: Normal sinus rhythm Axis: Left axis deviation Intervals: QTc 478 QRS: Left bundle branch block ST changes: Does not meet sgarbozza    ____________________________________________    RADIOLOGY  IMPRESSION: Changes consistent with vascular congestion and interstitial edema.  ____________________________________________   PROCEDURES  Procedure(s) performed: None  Critical Care performed: No  ____________________________________________   INITIAL IMPRESSION / ASSESSMENT AND PLAN / ED COURSE  Pertinent labs & imaging results that were available during my care of the patient were reviewed by me and considered in my medical decision making (see chart for details).  Patient here because of concerns for hypoxia and pneumonia. X-ray here without obvious pneumonia. Will check blood work.  Patient will be admitted to the hospital given hypoxia and poor condition.  ____________________________________________   FINAL CLINICAL IMPRESSION(S) / ED DIAGNOSES  Final diagnoses:  Altered mental status, unspecified altered mental status type  Hypoxia     Nance Pear, MD 08/29/14 2044

## 2014-08-29 NOTE — ED Notes (Signed)
Family worried that pt's abd is bloating and that she hasnt urinated. Bladder scanned for 238cc. Dr aware.

## 2014-08-29 NOTE — H&P (Signed)
[facility] [place] NAME: Brenda Roman    MR#:  [id number]  DATE OF BIRTH:  1923-10-01  DATE OF ADMISSION:  08/29/2014  PRIMARY CARE PHYSICIAN: Kirk Ruths., MD   REQUESTING/REFERRING PHYSICIAN: Dr. Archie Balboa  CHIEF COMPLAINT:   Chief Complaint  Patient presents with  . Shortness of Breath    dx with pneumonia yesterday - in rehab for left humerus fx    HISTORY OF PRESENT ILLNESS:  Brenda Roman  is a 81 y.o. female with a known history of CAD, liver transplant, A. Fib, hypertension and temporal arthritis. Patient is a confused, unable to provide any information. According to patient's husband and daughter, patient was admitted to the hospital 5 days ago for possible acute CVA. However, I MRI of brain didn't show any acute CVA. Patient was discharged to home. 2 days after discharge, patient started to have confusion, poor oral intake and weakness. The patient general condition has been declining for the past few days. Patient got a urinary tract infection days ago, was treated with antibiotics. Since the patient was not feeling better, she was sent to PCPs office for follow-up. Chest x-ray show pneumonia. Patient was also found to have HYPOXIA. Chest x-ray E ED today didn't show pneumonia but showed interstitial edema. Patient to urinalysis is pending. Dr. Archie Balboa discussed with the patient's family member, the patient husband wants. Care consult.  PAST MEDICAL HISTORY:   Past Medical History  Diagnosis Date  . Liver transplanted   . Macular degeneration   . Coronary artery disease   . Hypertension     PAST SURGICAL HISTORY:   Past Surgical History  Procedure Laterality Date  . Coronary artery bypass graft      SOCIAL HISTORY:   History  Substance Use Topics  . Smoking status: Former Research scientist (life sciences)  . Smokeless tobacco: Not on file  . Alcohol Use: No    FAMILY HISTORY:  No family history on file. unknown.  DRUG  ALLERGIES:   Allergies  Allergen Reactions  . Cephalexin Other (See Comments)    Reaction:  Unknown  . Novocain [Procaine] Rash  . Sulfa Antibiotics Rash    REVIEW OF SYSTEMS:  The patient is confused, unable to get any review of system.  MEDICATIONS AT HOME:   Prior to Admission medications   Medication Sig Start Date End Date Taking? Authorizing Provider  aspirin EC 81 MG tablet Take 81 mg by mouth 2 (two) times daily.    Yes Historical Provider, MD  beta carotene w/minerals (OCUVITE) tablet Take 1 tablet by mouth 2 (two) times daily.   Yes Historical Provider, MD  Cholecalciferol (VITAMIN D3) 1000 UNITS CAPS Take 2 capsules by mouth daily.   Yes Historical Provider, MD  metoprolol succinate (TOPROL-XL) 50 MG 24 hr tablet Take 50-100 mg by mouth 2 (two) times daily. Pt takes 2 tabs (100mg ) every morning, and 1 tab (50mg ) every evening   Yes Historical Provider, MD  mycophenolate (CELLCEPT) 500 MG tablet Take 500 mg by mouth every 12 (twelve) hours.   Yes Historical Provider, MD  rivaroxaban (XARELTO) 20 MG TABS tablet Take 20 mg by mouth daily with supper.   Yes Historical Provider, MD  CALCIUM CARBONATE PO Take 1 tablet by mouth daily.    Historical Provider, MD  ezetimibe-simvastatin (VYTORIN) 10-20 MG per tablet Take 1 tablet by mouth at bedtime.    Historical Provider, MD  nitroGLYCERIN (NITROSTAT) 0.4 MG SL tablet Place 0.4 mg under  the tongue every 5 (five) minutes as needed for chest pain.    Historical Provider, MD  ondansetron (ZOFRAN) 4 MG tablet Take 4 mg by mouth every 8 (eight) hours as needed for nausea or vomiting.    Historical Provider, MD  predniSONE (DELTASONE) 5 MG tablet Take 5 mg by mouth daily.    Historical Provider, MD  zolpidem (AMBIEN) 5 MG tablet Take 5 mg by mouth at bedtime as needed for sleep.     Historical Provider, MD      VITAL SIGNS:  Blood pressure 169/71, pulse 87, resp. rate 22, SpO2 93 %.  PHYSICAL EXAMINATION:  GENERAL:  81 y.o.-year-old  patient lying in the bed with critical ill looking. EYES: Pupils equal, round, reactive to light and accommodation. No scleral icterus. Extraocular muscles intact.  HEENT: Head atraumatic, normocephalic. Very dry oral mucosa.  NECK:  Supple, no jugular venous distention. No thyroid enlargement, no tenderness.  LUNGS: Mild crackles, no rhonchi or wheezing. No use of accessory muscles of respiration.  CARDIOVASCULAR: S1, S2 normal. Systolic murmurs 4/6, no rubs, or gallops.  ABDOMEN: Soft, nontender, distended. Bowel sounds are hyperactive. No organomegaly or mass.  EXTREMITIES: No pedal edema, cyanosis, or clubbing.  NEUROLOGIC: The patient is confused, does not follow commands, and able to exam. PSYCHIATRIC: The patient is confused. SKIN: No obvious rash, lesion, or ulcer.   LABORATORY PANEL:   CBC  Recent Labs Lab 08/29/14 1605  WBC 11.7*  HGB 10.1*  HCT 31.9*  PLT 351   ------------------------------------------------------------------------------------------------------------------  Chemistries   Recent Labs Lab 08/27/14 2000  08/29/14 1605  NA 124*  < > 125*  K 2.5*  < > 5.0  CL 88*  < > 90*  CO2 26  < > 27  GLUCOSE 93  < > 130*  BUN 12  < > 9  CREATININE 0.65  < > 0.58  CALCIUM 7.8*  < > 8.0*  MG 1.9  --   --   AST 20  --  18  ALT 14  --  12*  ALKPHOS 51  --  59  BILITOT 0.8  --  0.5  < > = values in this interval not displayed. ------------------------------------------------------------------------------------------------------------------  Cardiac Enzymes No results for input(s): TROPONINI in the last 168 hours. ------------------------------------------------------------------------------------------------------------------  RADIOLOGY:  Dg Chest Port 1 View  08/29/2014   CLINICAL DATA:  Shortness of breath, recent history of pneumonia  EXAM: PORTABLE CHEST - 1 VIEW  COMPARISON:  08/23/2014  FINDINGS: Cardiac shadow is enlarged. Postsurgical changes are  again seen and stable. Diffuse aortic calcifications are noted. The lungs are well aerated bilaterally and demonstrate diffuse interstitial changes likely related to degree of interstitial edema. No focal confluent infiltrate is seen. No bony abnormality is noted.  IMPRESSION: Changes consistent with vascular congestion and interstitial edema.   Electronically Signed   By: Inez Catalina M.D.   On: 08/29/2014 16:39    EKG:   Orders placed or performed during the hospital encounter of 08/29/14  . EKG test  . EKG test    IMPRESSION AND PLAN:   Acute encephalopathy. Possible due to dehydration and pneumonia. Possible pneumonia.  Per patient's family members request, they want medical treatment. I will start Levaquin, follow-up CBC. Hypoxia, possibly due to pneumonia. I will start oxygen per nasal cannula. Dehydration and hyponatremia. I will start normal saline IV and follow-up BMP. CAD and history of A. fib.Continue aspirin and xarelto,   Generally, the patient had condition is declining. Upon  patient's family member request, Dr. Archie Balboa requested palliative care consult.  All the records are reviewed and case discussed with ED provider. Management plans discussed with the patient's husband, son and daughter, and they are in agreement.  CODE STATUS: DO NOT RESUSCITATE TOTAL TIME TAKING CARE OF THIS PATIENT: 76 minutes.    Bridgett Larsson, Sheppard Evens .D on 08/29/2014 at 8:46 PM  Between 7am to 6pm - Pager - [phone]  After 6pm go to [email or web address] - password [email or web address] [facility]  Office  [phone]  CC: Primary care physician; Kirk Ruths., MD

## 2014-08-29 NOTE — ED Notes (Signed)
Pt had mobile xr yesterday showing pneumonia - sob today - sp02 on ra 88%

## 2014-08-30 DIAGNOSIS — Z944 Liver transplant status: Secondary | ICD-10-CM

## 2014-08-30 DIAGNOSIS — M316 Other giant cell arteritis: Secondary | ICD-10-CM

## 2014-08-30 DIAGNOSIS — Z515 Encounter for palliative care: Secondary | ICD-10-CM

## 2014-08-30 DIAGNOSIS — I2510 Atherosclerotic heart disease of native coronary artery without angina pectoris: Secondary | ICD-10-CM

## 2014-08-30 DIAGNOSIS — K743 Primary biliary cirrhosis: Secondary | ICD-10-CM

## 2014-08-30 DIAGNOSIS — J189 Pneumonia, unspecified organism: Principal | ICD-10-CM

## 2014-08-30 DIAGNOSIS — I10 Essential (primary) hypertension: Secondary | ICD-10-CM

## 2014-08-30 DIAGNOSIS — I38 Endocarditis, valve unspecified: Secondary | ICD-10-CM

## 2014-08-30 DIAGNOSIS — R4182 Altered mental status, unspecified: Secondary | ICD-10-CM

## 2014-08-30 DIAGNOSIS — Z7189 Other specified counseling: Secondary | ICD-10-CM | POA: Insufficient documentation

## 2014-08-30 DIAGNOSIS — S42302D Unspecified fracture of shaft of humerus, left arm, subsequent encounter for fracture with routine healing: Secondary | ICD-10-CM

## 2014-08-30 DIAGNOSIS — Z951 Presence of aortocoronary bypass graft: Secondary | ICD-10-CM

## 2014-08-30 DIAGNOSIS — Z79899 Other long term (current) drug therapy: Secondary | ICD-10-CM

## 2014-08-30 LAB — CBC
HCT: 30.1 % — ABNORMAL LOW (ref 35.0–47.0)
Hemoglobin: 9.6 g/dL — ABNORMAL LOW (ref 12.0–16.0)
MCH: 25.9 pg — ABNORMAL LOW (ref 26.0–34.0)
MCHC: 31.8 g/dL — ABNORMAL LOW (ref 32.0–36.0)
MCV: 81.4 fL (ref 80.0–100.0)
Platelets: 298 10*3/uL (ref 150–440)
RBC: 3.69 MIL/uL — AB (ref 3.80–5.20)
RDW: 16 % — AB (ref 11.5–14.5)
WBC: 8.9 10*3/uL (ref 3.6–11.0)

## 2014-08-30 LAB — BASIC METABOLIC PANEL
ANION GAP: 7 (ref 5–15)
BUN: 7 mg/dL (ref 6–20)
CHLORIDE: 92 mmol/L — AB (ref 101–111)
CO2: 26 mmol/L (ref 22–32)
Calcium: 7.4 mg/dL — ABNORMAL LOW (ref 8.9–10.3)
Creatinine, Ser: 0.47 mg/dL (ref 0.44–1.00)
GFR calc Af Amer: >60 mL/min (ref >60)
GFR calc non Af Amer: >60 mL/min (ref >60)
Glucose, Bld: 126 mg/dL — ABNORMAL HIGH (ref 65–99)
Potassium: 4.4 mmol/L (ref 3.5–5.1)
SODIUM: 125 mmol/L — AB (ref 135–145)

## 2014-08-30 LAB — VITAMIN D 25 HYDROXY (VIT D DEFICIENCY, FRACTURES): Vit D, 25-Hydroxy: 17.5 ng/mL — ABNORMAL LOW (ref 30.0–100.0)

## 2014-08-30 MED ORDER — CALCIUM CARBONATE ANTACID 500 MG PO CHEW
1.0000 | CHEWABLE_TABLET | ORAL | Status: DC | PRN
  Administered 2014-08-30: 200 mg via ORAL
  Filled 2014-08-30: qty 1

## 2014-08-30 MED ORDER — ENSURE ENLIVE PO LIQD
237.0000 mL | Freq: Two times a day (BID) | ORAL | Status: DC
  Administered 2014-08-30 – 2014-09-04 (×6): 237 mL via ORAL

## 2014-08-30 MED ORDER — BISACODYL 10 MG RE SUPP
10.0000 mg | Freq: Every day | RECTAL | Status: DC | PRN
  Administered 2014-08-30 – 2014-09-03 (×2): 10 mg via RECTAL
  Filled 2014-08-30 (×2): qty 1

## 2014-08-30 MED ORDER — MYCOPHENOLATE MOFETIL 250 MG PO CAPS
500.0000 mg | ORAL_CAPSULE | Freq: Two times a day (BID) | ORAL | Status: DC
  Administered 2014-08-30 – 2014-09-05 (×12): 500 mg via ORAL
  Filled 2014-08-30 (×13): qty 2

## 2014-08-30 NOTE — Clinical Social Work Note (Signed)
Clinical Social Work Assessment  Patient Details  Name: Brenda Roman MRN: [id number] Date of Birth: 11-10-1923  Date of referral:  08/30/14               Reason for consult:  Facility Placement                Permission sought to share information with:  Chartered certified accountant granted to share information::  Yes, Verbal Permission Granted  Name::      [employer]::   [place]  Relationship::   Admissions Administrator, arts Information:     Housing/Transportation Living arrangements for the past 2 months:  [address] of Information:  Adult Children Patient Interpreter Needed:  None Criminal Activity/Legal Involvement Pertinent to Current Situation/Hospitalization:  No - Comment as needed Significant Relationships:  Adult Children, Spouse Lives with:  Spouse Do you feel safe going back to the place where you live?  Yes Need for family participation in patient care:  Yes (Comment)  Care giving concerns: Per patient's son Brenda Roman patient will need 24 hour caregivers.    Social Worker assessment / plan: Holiday representative ([place]) met with patient's son Brenda Roman. Patient was asleep and son did not want to wake her. Son and CSW met in family room. Patient's son reported that patient went from the ED at [facility] on Friday at [facility] back to [facility]. Son reported that patient lives in [place] with her husband. Patient has 3 children. Patient's son Brenda Roman is from [place]. Per son the family met with Dr. Charlette Caffey today to discuss options and goals of care. Son reported that the family is currently discussing D/C options, which include going back to [facility] for rehab or going home with hospice and 24 hour caregivers. CSW explained to son that private caregivers will be an out of pocket expense. CSW also explained that in order for patient to return to [facility] and new [facility] authorization will have to be received. CSW explained that  patient will have to participate in PT and progress in order for [facility] to approve SNF stay. Son reported that all family members are not agreeing. Per son his preference is for patient to go home with caregivers where she is more comfortable. CSW provided son with private duty caregiver list. CSW also requested MD to put in PT consult. CSW will continue to follow and assist as needed.   Brenda Roman, [place] [phone]  Employment status:  Retired Nurse, adult PT Recommendations:  Not assessed at this time Information / Referral to community resources:  [place], Other (Comment Required) Administrator List)  Patient/Family's Response to care: Family is discussing D/C plan options.   Patient/Family's Understanding of and Emotional Response to Diagnosis, Current Treatment, and Prognosis: Palliative is involved and patient continues to decline. CSW provided emotional support to patient's son Brenda Roman.   Emotional Assessment Appearance:  Appears stated age Attitude/Demeanor/Rapport:    Affect (typically observed):  Unable to Assess Orientation:  Oriented to Self, Oriented to Place Alcohol / Substance use:  Not Applicable Psych involvement (Current and /or in the community):  No (Comment)  Discharge Needs  Concerns to be addressed:  Discharge Planning Concerns Readmission within the last 30 days:  Yes Current discharge risk:  Chronically ill Barriers to Discharge:  Family Issues   Brenda Freshwater, LCSW 08/30/2014, 3:47 PM

## 2014-08-30 NOTE — Progress Notes (Signed)
[place] [facility] [place] NAME: Brenda Roman    MR#:  [id number]  DATE OF BIRTH:  1923/07/20  SUBJECTIVE:  CHIEF COMPLAINT:   Chief Complaint  Patient presents with  . Shortness of Breath    dx with pneumonia yesterday - in rehab for left humerus fx   Pt. Here due to shortness of breath due to suspected pneumonia.  Lying in bed and no complaints presently.   REVIEW OF SYSTEMS:    Review of Systems  Constitutional: Negative for fever and chills.  HENT: Negative for congestion and tinnitus.   Eyes: Negative for blurred vision and double vision.  Respiratory: Negative for cough, shortness of breath and wheezing.   Cardiovascular: Negative for chest pain, orthopnea and PND.  Gastrointestinal: Negative for nausea, vomiting, abdominal pain and diarrhea.  Genitourinary: Negative for dysuria and hematuria.  Neurological: Negative for dizziness, sensory change and focal weakness.    Nutrition: Regular  Tolerating Diet: Very little   DRUG ALLERGIES:   Allergies  Allergen Reactions  . Cephalexin Other (See Comments)    Reaction:  Unknown  . Novocain [Procaine] Rash  . Sulfa Antibiotics Rash    VITALS:  Blood pressure 166/76, pulse 74, temperature 97.6 F (36.4 C), temperature source Oral, resp. rate 16, height 5\' 5"  (1.651 m), weight 65.227 kg (143 lb 12.8 oz), SpO2 100 %.  PHYSICAL EXAMINATION:   Physical Exam  GENERAL:  81 y.o.-year-old patient lying in the bed with no acute distress. Lethargic  EYES: Pupils equal, round, reactive to light and accommodation. No scleral icterus. Extraocular muscles intact.  HEENT: Head atraumatic, normocephalic. Oropharynx and nasopharynx clear.  Dry Oral Mucosa NECK:  Supple, no jugular venous distention. No thyroid enlargement, no tenderness.  LUNGS: Normal breath sounds bilaterally, no wheezing, rales,rhonchi or crepitation. No use of accessory muscles of respiration.  CARDIOVASCULAR: S1,  S2 normal. II/VI SEM at RSB, no rubs or gallops.  ABDOMEN: Soft, nontender, nondistended. Bowel sounds present. No organomegaly or mass.  EXTREMITIES: No cyanosis, clubbing or edema b/l.    NEUROLOGIC: Cranial nerves II through XII are intact. No focal Motor or sensory deficits b/l.  Globally weak PSYCHIATRIC: The patient is alert and oriented x 2. Lethargic.  SKIN: No obvious rash, lesion, or ulcer.    LABORATORY PANEL:   CBC  Recent Labs Lab 08/30/14 0430  WBC 8.9  HGB 9.6*  HCT 30.1*  PLT 298   ------------------------------------------------------------------------------------------------------------------  Chemistries   Recent Labs Lab 08/27/14 2000  08/29/14 1605 08/30/14 0430  NA 124*  < > 125* 125*  K 2.5*  < > 5.0 4.4  CL 88*  < > 90* 92*  CO2 26  < > 27 26  GLUCOSE 93  < > 130* 126*  BUN 12  < > 9 7  CREATININE 0.65  < > 0.58 0.47  CALCIUM 7.8*  < > 8.0* 7.4*  MG 1.9  --   --   --   AST 20  --  18  --   ALT 14  --  12*  --   ALKPHOS 51  --  59  --   BILITOT 0.8  --  0.5  --   < > = values in this interval not displayed. ------------------------------------------------------------------------------------------------------------------  Cardiac Enzymes No results for input(s): TROPONINI in the last 168 hours. ------------------------------------------------------------------------------------------------------------------  RADIOLOGY:  Dg Chest Port 1 View  08/29/2014   CLINICAL DATA:  Shortness of breath, recent history of pneumonia  EXAM: PORTABLE CHEST - 1 VIEW  COMPARISON:  08/23/2014  FINDINGS: Cardiac shadow is enlarged. Postsurgical changes are again seen and stable. Diffuse aortic calcifications are noted. The lungs are well aerated bilaterally and demonstrate diffuse interstitial changes likely related to degree of interstitial edema. No focal confluent infiltrate is seen. No bony abnormality is noted.  IMPRESSION: Changes consistent with vascular  congestion and interstitial edema.   Electronically Signed   By: Inez Catalina M.D.   On: 08/29/2014 16:39     ASSESSMENT AND PLAN:   81 yo female w/ hx of Primary Billiary Cirrhosis s/p Liver Transplant, hx of recent TIA, chronic a. Fib who presented to the hospital w/ shortness of breath.   * Pneumonia - suspected cause of pt's shortness of breath. Although pt's CXR (-) for pneumonia.  - will cont. Levaquin and follow cultures.  - afebrile, hemodynamically stable.   * hx of Liver Transplant - cont. Cellcept, Prednisone.   * hx of Chronic a. Fib - rate controlled.  - cont. Toprol.  Cont. Xarelto.   * Adult Failure to Thrive - start ensure supplements.  - cont. IV fluids.   Palliative Care following and pt. Remains DNR and family hopes pt improved w/ abx otherwise they will consider possible Hospice Home.      All the records are reviewed and case discussed with Care Management/Social Workerr. Management plans discussed with the patient, family and they are in agreement.  CODE STATUS: DNR  DVT Prophylaxis: Xarelto  TOTAL TIME TAKING CARE OF THIS PATIENT: 30 minutes.   POSSIBLE D/C IN 1-2 DAYS, DEPENDING ON CLINICAL CONDITION.   Henreitta Leber M.D on 08/30/2014 at 2:57 PM  Between 7am to 6pm - Pager - [phone]  After 6pm go to [email or web address] - password [email or web address] [facility]  Office  [phone]  CC: Primary care physician; Kirk Ruths., MD

## 2014-08-30 NOTE — Consult Note (Signed)
Palliative Medicine Inpatient Consult Note   Name: Brenda Roman Date: 08/30/2014 MRN: [id number]  DOB: Oct 29, 1923  Referring Physician: Henreitta Leber, MD  Palliative Care consult requested for this 81 y.o. female for goals of medical therapy in patient with liver transplant secondary to [place], CAD s/p CABG, valvular heart disease, recent arm fx, admitted with presumed pneumonia  Ms Brenda Roman is an 81 yo woman with PMH of PBC s/p liver transplant, CAD s/p CABG, mod-severe AS/mod MS, HTN, CVA, a.fib, temporal arteritis. She was hospitalized 5/2-08/24/14 with UTI, d/c'd to home where she fell resulting in a L.humerus fx. She went to SNF for STR and was readmitted from SNF on 5/8 with shortness of breath and altered mental status. She is being tx'd for presumed pneumonia. At present, pt is lying in bed, sleeping but arousable. Family at bedside.    REVIEW OF SYSTEMS:  Pain: None  Unable to complete ROS   SOCIAL HISTORY: Pt lives at home with her husband. She has 2 sons and a daughter, all of whom are very involved in pt's care.   reports that she has quit smoking. She does not have any smokeless tobacco history on file. She reports that she does not drink alcohol or use illicit drugs.  CODE STATUS: DNR  PAST MEDICAL HISTORY: Past Medical History  Diagnosis Date  . Liver transplanted   . Macular degeneration   . Coronary artery disease   . Hypertension     PAST SURGICAL HISTORY:  Past Surgical History  Procedure Laterality Date  . Coronary artery bypass graft      ALLERGIES:  is allergic to cephalexin; novocain; and sulfa antibiotics.  MEDICATIONS:  Current Facility-Administered Medications  Medication Dose Route Frequency Provider Last Rate Last Dose  . 0.9 %  sodium chloride infusion   Intravenous Continuous Demetrios Loll, MD 75 mL/hr at 08/30/14 0915    . acetaminophen (TYLENOL) tablet 650 mg  650 mg Oral Q6H PRN Demetrios Loll, MD   325 mg at 08/30/14 5003   Or  . acetaminophen  (TYLENOL) suppository 650 mg  650 mg Rectal Q6H PRN Demetrios Loll, MD      . albuterol (PROVENTIL) (2.5 MG/3ML) 0.083% nebulizer solution 2.5 mg  2.5 mg Nebulization Q2H PRN Demetrios Loll, MD      . aspirin EC tablet 81 mg  81 mg Oral BID Demetrios Loll, MD   81 mg at 08/30/14 0915  . bisacodyl (DULCOLAX) suppository 10 mg  10 mg Rectal Daily PRN Henreitta Leber, MD   10 mg at 08/30/14 0915  . levofloxacin (LEVAQUIN) IVPB 500 mg  500 mg Intravenous Q24H Demetrios Loll, MD 100 mL/hr at 08/30/14 0916 500 mg at 08/30/14 0916  . metoprolol succinate (TOPROL-XL) 24 hr tablet 50 mg  50 mg Oral BID Demetrios Loll, MD   50 mg at 08/30/14 0930  . mycophenolate (CELLCEPT) capsule 500 mg  500 mg Oral BID Henreitta Leber, MD   500 mg at 08/30/14 1143  . nitroGLYCERIN (NITROSTAT) SL tablet 0.4 mg  0.4 mg Sublingual Q5 min PRN Demetrios Loll, MD      . ondansetron [facility]) tablet 4 mg  4 mg Oral Q6H PRN Demetrios Loll, MD       Or  . ondansetron [facility]) injection 4 mg  4 mg Intravenous Q6H PRN Demetrios Loll, MD      . predniSONE (DELTASONE) tablet 5 mg  5 mg Oral Daily Demetrios Loll, MD   5 mg at 08/30/14 0915  .  rivaroxaban (XARELTO) tablet 20 mg  20 mg Oral Q supper Demetrios Loll, MD      . senna-docusate (Senokot-S) tablet 1 tablet  1 tablet Oral QHS PRN Demetrios Loll, MD   1 tablet at 08/29/14 2359    Vital Signs: BP 166/76 mmHg  Pulse 74  Temp(Src) 97.6 F (36.4 C) (Oral)  Resp 16  Ht 5' 5" (1.651 m)  Wt 65.227 kg (143 lb 12.8 oz)  BMI 23.93 kg/m2  SpO2 100% Filed Weights   08/30/14 0533  Weight: 65.227 kg (143 lb 12.8 oz)    Estimated body mass index is 23.93 kg/(m^2) as calculated from the following:   Height as of this encounter: 5' 5" (1.651 m).   Weight as of this encounter: 65.227 kg (143 lb 12.8 oz).   PHYSICAL EXAM:  General: sleeping but arousable HEENT: OP clear, moist oral mucosa, hearing intact Neck: Trachea midline, no LAN Cardiovascular: irregular rate and rhythm Pulmonary/Chest: Clear ant fields Abdominal: Soft,  NTTP, hypoactive bowel sounds GU: No SP tenderness Extremities: No edema, SCD's in place, L.UE in sling Neurological: Grossly nonfocal Skin: no rashes or ulcers Psychiatric: Oriented to place and year  LABS: CBC:  Recent Labs Lab 08/29/14 1605 08/30/14 0430  WBC 11.7* 8.9  HGB 10.1* 9.6*  HCT 31.9* 30.1*  PLT 351 298   Comprehensive Metabolic Panel:  Recent Labs Lab 08/27/14 2000  08/29/14 1605 08/30/14 0430  NA 124*  < > 125* 125*  K 2.5*  < > 5.0 4.4  CL 88*  < > 90* 92*  CO2 26  < > 27 26  GLUCOSE 93  < > 130* 126*  BUN 12  < > 9 7  CREATININE 0.65  < > 0.58 0.47  CALCIUM 7.8*  < > 8.0* 7.4*  MG 1.9  --   --   --   AST 20  --  18  --   ALT 14  --  12*  --   ALKPHOS 51  --  59  --   BILITOT 0.8  --  0.5  --   < > = values in this interval not displayed.  IMPRESSION:  Ms Brenda Roman is an 81 yo woman with PMH of PBC s/p liver transplant, CAD s/p CABG, mod-severe AS/mod MS, HTN, CVA, a.fib, temporal arteritis. She was hospitalized 5/2-08/24/14 with UTI, d/c'd to home where she fell resulting in a L.humerus fx. She went to SNF for STR and was readmitted from SNF on 5/8 with shortness of breath and altered mental status. She is being tx'd for presumed pneumonia. At present, pt is lying in bed, sleeping but arousable. Family at bedside.   I met with pt's family (husband, 2 sons, daughter). Updated them on pt's current condition. They all confirm that pt was doing well at home prior to initial hospital admission 5/2. They remain hopeful that pt can recover from her acute illness and return home after STR. However, they understand that if pt does not return to previous baseline, they will need to consider other options such as home hospice.   I will continue to follow to see if pt progresses and to discuss goals of tx with family. Family expressed appreciation for meeting. All questions answered.   PLAN: As above  More than 50% of the visit was spent in counseling/coordination  of care: YES  Time spent: 75 minutes
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
## 2014-08-31 LAB — OSMOLALITY: OSMOLALITY: 259 mosm/kg — AB (ref 275–295)

## 2014-08-31 LAB — SODIUM, URINE, RANDOM: SODIUM UR: 116 mmol/L

## 2014-08-31 LAB — OSMOLALITY, URINE: Osmolality, Ur: 360 mOsm/kg (ref 300–900)

## 2014-08-31 MED ORDER — ZOLPIDEM TARTRATE 5 MG PO TABS
5.0000 mg | ORAL_TABLET | Freq: Every evening | ORAL | Status: DC | PRN
  Filled 2014-08-31: qty 1

## 2014-08-31 MED ORDER — MEGESTROL ACETATE 400 MG/10ML PO SUSP
400.0000 mg | Freq: Two times a day (BID) | ORAL | Status: DC
  Administered 2014-08-31 (×2): 400 mg via ORAL
  Administered 2014-09-01: 19200 mg via ORAL
  Administered 2014-09-01 – 2014-09-05 (×7): 400 mg via ORAL
  Filled 2014-08-31 (×10): qty 10

## 2014-08-31 MED ORDER — LEVOFLOXACIN 500 MG PO TABS
500.0000 mg | ORAL_TABLET | Freq: Every day | ORAL | Status: DC
  Administered 2014-08-31: 500 mg via ORAL
  Filled 2014-08-31: qty 1

## 2014-08-31 MED ORDER — ZOLPIDEM TARTRATE 5 MG PO TABS
5.0000 mg | ORAL_TABLET | Freq: Every evening | ORAL | Status: DC | PRN
  Administered 2014-09-01 – 2014-09-02 (×3): 5 mg via ORAL
  Filled 2014-08-31 (×4): qty 1

## 2014-08-31 MED ORDER — OXYCODONE HCL 5 MG PO TABS
5.0000 mg | ORAL_TABLET | Freq: Four times a day (QID) | ORAL | Status: DC | PRN
  Administered 2014-08-31 – 2014-09-04 (×4): 5 mg via ORAL
  Filled 2014-08-31 (×5): qty 1

## 2014-08-31 MED ORDER — LEVOFLOXACIN 500 MG PO TABS
500.0000 mg | ORAL_TABLET | ORAL | Status: AC
  Administered 2014-09-02 – 2014-09-04 (×2): 500 mg via ORAL
  Filled 2014-08-31 (×2): qty 1

## 2014-08-31 NOTE — Outcomes Assessment (Signed)
Pt still very lethargic with poor endurance. Unable to get up with PT but did get up with nursing this afternoon. Sat up in chair for about an hour and a half. Family very concerned and worried about patient not eating and her lethargy. Urine sent to lab pt is drinking with encouragement but not eating well at all. Diminished breath sounds using IS when encouraged to do so.

## 2014-08-31 NOTE — Evaluation (Signed)
Physical Therapy Evaluation Patient Details Name: Brenda Roman MRN: [id number] DOB: 1924/03/25 Today's Date: 08/31/2014   History of Present Illness  81 yo female with onset of   L humeral fracture and new PNA with weakness and plan to return to inpt therapy when done with this stay.  PMHx: liver transplant, cirrhosis, a-fib  Clinical Impression  Pt was not able to do much today as she is nauseated and very weak. Her PLOF was more independent but now is going to rehab again to recover independent gait on LRAD.  Her family all in attendance to discuss and observe her progress.  Will try to get her into chair after standing tomorrow if able.    Follow Up Recommendations SNF    Equipment Recommendations  None recommended by PT;Other (comment) (allow SNF to recommend)    Recommendations for Other Services       Precautions / Restrictions Precautions Precautions: Fall Restrictions Weight Bearing Restrictions: Yes LUE Weight Bearing: Non weight bearing      Mobility  Bed Mobility Overal bed mobility: Needs Assistance Bed Mobility: Supine to Sit;Sit to Supine     Supine to sit: Mod assist Sit to supine: Mod assist   General bed mobility comments: max of 2 to slide up and mod 1 to reposition off LUE  Transfers                    Ambulation/Gait                Stairs            Wheelchair Mobility    Modified Rankin (Stroke Patients Only)       Balance     Sitting balance-Leahy Scale: Fair Sitting balance - Comments: prefers to lean to L in bed                                     Pertinent Vitals/Pain Pain Assessment: Faces Faces Pain Scale: Hurts even more Pain Location: R shoulder when moving only Pain Intervention(s): Limited activity within patient's tolerance;Monitored during session    [employer] expects to be discharged to:: Skilled nursing facility Living Arrangements: Spouse/significant  other Available Help at Discharge: Family Type of Home: House Home Access: Stairs to enter Entrance Stairs-Rails: Right Entrance Stairs-Number of Steps: 6 Home Layout: Able to live on main level with bedroom/bathroom Home Equipment: Bedside commode;Hospital bed;Wheelchair - manual      Prior Function Level of Independence: Independent         Comments: Pt was in SNF just prior to being diagnosed with PNA and is planning to return there as her family was unable to care for her at home     Hand Dominance   Dominant Hand: Right    Extremity/Trunk Assessment   Upper Extremity Assessment: LUE deficits/detail       LUE Deficits / Details: new L humeral fracture and in sling   Lower Extremity Assessment: Generalized weakness      Cervical / Trunk Assessment: Kyphotic  Communication   Communication: HOH;Other (comment) (Lethargic and not speaking much)  Cognition Arousal/Alertness: Lethargic Behavior During Therapy: Flat affect Overall Cognitive Status: Within Functional Limits for tasks assessed                      General Comments General comments (skin integrity, edema, etc.): Pt became nauseous once PT sat  her up and by the time PT was done she had vomited.  Nursing in to assess her need for antinausea meds    Exercises Other Exercises Other Exercises: ROM to LE's with ankles 0 deg DF      Assessment/Plan    PT Assessment Patient needs continued PT services  PT Diagnosis Generalized weakness   PT Problem List Decreased strength;Decreased range of motion;Decreased activity tolerance;Decreased balance;Decreased mobility;Decreased coordination;Decreased knowledge of use of DME;Pain;Other (comment) (HOH but did don the hearing aid)  PT Treatment Interventions Gait training;Stair training;DME instruction;Functional mobility training;Therapeutic activities;Therapeutic exercise;Balance training;Neuromuscular re-education;Patient/family education   PT Goals  (Current goals can be found in the Care Plan section) Acute Rehab PT Goals Patient Stated Goal: none stated PT Goal Formulation: With patient/family Time For Goal Achievement: 09/14/14 Potential to Achieve Goals: Good    Frequency Min 2X/week   Barriers to discharge Inaccessible home environment;Decreased caregiver support husband cannot lift her    Co-evaluation               End of Session Equipment Utilized During Treatment: Gait belt Activity Tolerance: Patient limited by pain;Patient limited by fatigue;Other (comment);Treatment limited secondary to medical complications (Comment) (vomited) Patient left: in bed;with call bell/phone within reach;with bed alarm set;with family/visitor present;with nursing/sitter in room Nurse Communication: Mobility status;Other (comment)         Time: [id number] PT Time Calculation (min) (ACUTE ONLY): 30 min   Charges:   PT Evaluation $Initial PT Evaluation Tier I: 1 Procedure PT Treatments $Therapeutic Activity: 8-22 mins   PT G Codes:        Ramond Dial Sep 07, 2014, 10:19 AM  Mee Hives, PT MS Acute Rehab Dept. Number: [facility] [id number] and [place] [phone]

## 2014-08-31 NOTE — Consult Note (Signed)
Palliative Medicine Inpatient Consult Follow Up Note   Name: Brenda Roman Date: 08/31/2014 MRN: [id number]  DOB: [birth year]  Referring Physician: Henreitta Leber, MD  Palliative Care consult requested for this 81 y.o. female for goals of medical therapy in patient with PBC s/p liver transplant, CAD s/p CABG, mod-severe AS/mod Brenda, HTN, CVA, a.fib, temporal arteritis.  Brenda Roman is lying in bed, sleeping but arousable. Answers questions with prompting. Denies pain. Son at bedside.   REVIEW OF SYSTEMS:  Pain: None Dyspnea:  No Nausea/Vomiting:  No Diarrhea:  No Constipation:   No Depression:   No Anxiety:   No Fatigue:   Yes  CODE STATUS: DNR   PAST MEDICAL HISTORY: Past Medical History  Diagnosis Date  . Liver transplanted   . Macular degeneration   . Coronary artery disease   . Hypertension     PAST SURGICAL HISTORY:  Past Surgical History  Procedure Laterality Date  . Coronary artery bypass graft      Vital Signs: BP 164/72 mmHg  Pulse 68  Temp(Src) 97.1 F (36.2 C) (Axillary)  Resp 20  Ht 5\' 5"  (1.651 m)  Wt 65.227 kg (143 lb 12.8 oz)  BMI 23.93 kg/m2  SpO2 99% Filed Weights   08/30/14 0533  Weight: 65.227 kg (143 lb 12.8 oz)    Estimated body mass index is 23.93 kg/(m^2) as calculated from the following:   Height as of this encounter: 5\' 5"  (1.651 m).   Weight as of this encounter: 65.227 kg (143 lb 12.8 oz).  PHYSICAL EXAM: Generall: ill-appearing HEENT: OP clear, decreased hearing, hearing aid in R.ear Neck: Trachea midline  Cardiovascular: regular rate and rhythm Pulmonary/Chest: Clear ant fields Abdominal: Soft, NTTP, + bowel sounds GU: No SP tenderness Extremities: No edema  Neurological: Grossly nonfocal Skin: no rashed Psychiatric: A&O x 3   LABS: CBC:    Component Value Date/Time   WBC 8.9 08/30/2014 0430   WBC 6.6 12/03/2013 2039   HGB 9.6* 08/30/2014 0430   HGB 11.1* 12/03/2013 2039   HCT 30.1* 08/30/2014 0430   HCT 35.6  12/03/2013 2039   PLT 298 08/30/2014 0430   PLT 292 12/03/2013 2039   MCV 81.4 08/30/2014 0430   MCV 89 12/03/2013 2039   NEUTROABS 9.4* 08/27/2014 2000   NEUTROABS 3.5 12/27/2012 0554   LYMPHSABS 0.6* 08/27/2014 2000   LYMPHSABS 1.6 12/27/2012 0554   MONOABS 0.8 08/27/2014 2000   MONOABS 0.6 12/27/2012 0554   EOSABS 0.0 08/27/2014 2000   EOSABS 0.1 12/27/2012 0554   BASOSABS 0.0 08/27/2014 2000   BASOSABS 0.0 12/27/2012 0554   Comprehensive Metabolic Panel:    Component Value Date/Time   NA 125* 08/30/2014 0430   NA 137 12/03/2013 2039   K 4.4 08/30/2014 0430   K 4.2 12/03/2013 2039   CL 92* 08/30/2014 0430   CL 101 12/03/2013 2039   CO2 26 08/30/2014 0430   CO2 31 12/03/2013 2039   BUN 7 08/30/2014 0430   BUN 22* 12/03/2013 2039   CREATININE 0.47 08/30/2014 0430   CREATININE 1.16 12/03/2013 2039   GLUCOSE 126* 08/30/2014 0430   GLUCOSE 113* 12/03/2013 2039   CALCIUM 7.4* 08/30/2014 0430   CALCIUM 9.8 12/03/2013 2039   AST 18 08/29/2014 1605   AST 24 12/03/2013 2039   ALT 12* 08/29/2014 1605   ALT 32 12/03/2013 2039   ALKPHOS 59 08/29/2014 1605   ALKPHOS 92 12/03/2013 2039   BILITOT 0.5 08/29/2014 1605   PROT 6.1* 08/29/2014 1605  PROT 7.3 12/03/2013 2039   ALBUMIN 3.2* 08/29/2014 1605   ALBUMIN 3.8 12/03/2013 2039    IMPRESSION: Brenda Roman is an 81 yo woman with PMH of PBC s/p liver transplant, CAD s/p CABG, mod-severe AS/mod Brenda, HTN, CVA, a.fib, temporal arteritis. She was hospitalized 5/2-08/24/14 with UTI, d/c'd to home where she fell resulting in a L.humerus fx. She went to SNF for STR and was readmitted from SNF on 5/8 with shortness of breath and altered mental status. She is being tx'd for presumed pneumonia.   Pt remains very lethargic though more alert than yesterday. Med list reviewed and no new or sedating meds identified. Appears to have at least tried to work with PT today with recommendation for STR at discharge.   Discussed above with son.     PLAN: D/c to SNF for STR when medically ready  More than 50% of the visit was spent in counseling/coordination of care: YES  Time spent: 25 minutes

## 2014-08-31 NOTE — Progress Notes (Signed)
PT is recommending SNF. Clinical Education officer, museum (CSW) started [employer] authorization and faxed clinicals in today. CSW made patient's son and daughter aware of above. Plan is for patient to return to [facility]. CSW will continue to follow and assist as needed.   Blima Rich, [place] [phone]

## 2014-08-31 NOTE — Progress Notes (Signed)
[place] [facility] [place] NAME: Brenda Roman    MR#:  [id number]  DATE OF BIRTH:  [birth year]  SUBJECTIVE:  CHIEF COMPLAINT:   Chief Complaint  Patient presents with  . Shortness of Breath    dx with pneumonia yesterday - in rehab for left humerus fx   Family at bedside.  Mental status has improved.  No shortness of breath.  PO intake still remains very poor.   REVIEW OF SYSTEMS:    Review of Systems  Constitutional: Negative for fever and chills.  HENT: Negative for congestion and tinnitus.   Eyes: Negative for blurred vision and double vision.  Respiratory: Negative for cough, shortness of breath and wheezing.   Cardiovascular: Negative for chest pain, orthopnea and PND.  Gastrointestinal: Negative for nausea, vomiting, abdominal pain and diarrhea.  Genitourinary: Negative for dysuria and hematuria.  Neurological: Positive for weakness. Negative for dizziness, sensory change and focal weakness.    Nutrition: Regular diet Tolerating Diet: Very little.  Tolerating PT: Yes     DRUG ALLERGIES:   Allergies  Allergen Reactions  . Cephalexin Other (See Comments)    Reaction:  Unknown  . Novocain [Procaine] Rash  . Sulfa Antibiotics Rash    VITALS:  Blood pressure 164/72, pulse 68, temperature 97.1 F (36.2 C), temperature source Axillary, resp. rate 20, height 5\' 5"  (1.651 m), weight 65.227 kg (143 lb 12.8 oz), SpO2 99 %.  PHYSICAL EXAMINATION:   Physical Exam  GENERAL:  81 y.o.-year-old patient lying in the bed lethargic but in NAD EYES: Pupils equal, round, reactive to light and accommodation. No scleral icterus. Extraocular muscles intact.  HEENT: Head atraumatic, normocephalic. Oropharynx and nasopharynx clear.  NECK:  Supple, no jugular venous distention. No thyroid enlargement, no tenderness.  LUNGS: Normal breath sounds bilaterally, bibasilar crackles. No rhonchi, wheezing. (-) use of accessory  muscles. CARDIOVASCULAR: S1, S2 normal. II/VI SEM at RSB, No rubs, or gallops.  ABDOMEN: Soft, nontender, nondistended. Bowel sounds present. No organomegaly or mass.  EXTREMITIES: No cyanosis, clubbing or edema b/l.   Left shoulder in sling due to recent fracture NEUROLOGIC: Cranial nerves II through XII are intact. No focal Motor or sensory deficits b/l.  Globally weak. PSYCHIATRIC: The patient is alert and oriented x 3.  SKIN: No obvious rash, lesion, or ulcer.    LABORATORY PANEL:   CBC  Recent Labs Lab 08/30/14 0430  WBC 8.9  HGB 9.6*  HCT 30.1*  PLT 298   ------------------------------------------------------------------------------------------------------------------  Chemistries   Recent Labs Lab 08/27/14 2000  08/29/14 1605 08/30/14 0430  NA 124*  < > 125* 125*  K 2.5*  < > 5.0 4.4  CL 88*  < > 90* 92*  CO2 26  < > 27 26  GLUCOSE 93  < > 130* 126*  BUN 12  < > 9 7  CREATININE 0.65  < > 0.58 0.47  CALCIUM 7.8*  < > 8.0* 7.4*  MG 1.9  --   --   --   AST 20  --  18  --   ALT 14  --  12*  --   ALKPHOS 51  --  59  --   BILITOT 0.8  --  0.5  --   < > = values in this interval not displayed. ------------------------------------------------------------------------------------------------------------------  Cardiac Enzymes No results for input(s): TROPONINI in the last 168 hours. ------------------------------------------------------------------------------------------------------------------  RADIOLOGY:  Dg Chest Port 1 View  08/29/2014   CLINICAL DATA:  Shortness of breath, recent history of pneumonia  EXAM: PORTABLE CHEST - 1 VIEW  COMPARISON:  08/23/2014  FINDINGS: Cardiac shadow is enlarged. Postsurgical changes are again seen and stable. Diffuse aortic calcifications are noted. The lungs are well aerated bilaterally and demonstrate diffuse interstitial changes likely related to degree of interstitial edema. No focal confluent infiltrate is seen. No bony  abnormality is noted.  IMPRESSION: Changes consistent with vascular congestion and interstitial edema.   Electronically Signed   By: Inez Catalina M.D.   On: 08/29/2014 16:39     ASSESSMENT AND PLAN:   81 yo female w/ hx of Primary Billiary Cirrhosis s/p Liver Transplant, hx of recent TIA, chronic a. Fib who presented to the hospital w/ shortness of breath.   * Pneumonia - suspected cause of pt's shortness of breath. Although pt's CXR (-) for pneumonia.  - will cont. Levaquin and cultures so far (-).  - afebrile, hemodynamically stable.   * Hyponatremia - etiology unclear.  ?? Euvolemic (vs) hypovolemic.  - will check Urine sodium, Urine & serum Osm.  - cont. Gentle IV fluids and encourage PO intake. Follow sodium.  - if needed consider Nephro consult.   * hx of Liver Transplant - cont. Cellcept, Prednisone.   * hx of Chronic a. Fib - rate controlled.  - cont. Toprol. Cont. Xarelto.   * Adult Failure to Thrive - cont. Ensure supplements.  - will start Megace and cont. IV fluids.   Palliative Care following and pt. Remains DNR   Discussed plan of care w/ pt. And family at bedside.    All the records are reviewed and case discussed with Care Management/Social Workerr. Management plans discussed with the patient, family and they are in agreement.  CODE STATUS: DNR  DVT Prophylaxis: Xarelto.   TOTAL TIME TAKING CARE OF THIS PATIENT: 30 minutes.   POSSIBLE D/C to SNF in 1-2 DAYS, DEPENDING ON CLINICAL CONDITION.   Henreitta Leber M.D on 08/31/2014 at 10:26 AM  Between 7am to 6pm - Pager - [phone]  After 6pm go to [email or web address] - password [email or web address] [place] [facility]  Office  [phone]  CC: Primary care physician; Kirk Ruths., MD

## 2014-08-31 NOTE — Progress Notes (Signed)
[employer] Authorization received. Auth # [id number]. Next Review Date 09/03/14. Approved for 3 days. If patient does not discharge in the next 48 hours a new blue medicare authorization will have to be received. CSW made MD aware of above. CSW also made patient's son [name] aware of above. CSW will continue to follow and assist as needed.   Blima Rich, [place] [phone]

## 2014-08-31 NOTE — Outcomes Assessment (Signed)
VSS, patient is A+O with no signs of distress.  Pain is controlled with PRN tylenol.  Voids in bedpan and incontinent at times.  LUE elevated with pillows and sling in place.  Lungs diminished at bases.  IV fluids infusing

## 2014-09-01 ENCOUNTER — Inpatient Hospital Stay: Payer: Medicare Other

## 2014-09-01 DIAGNOSIS — R410 Disorientation, unspecified: Secondary | ICD-10-CM

## 2014-09-01 DIAGNOSIS — I776 Arteritis, unspecified: Secondary | ICD-10-CM

## 2014-09-01 DIAGNOSIS — Z66 Do not resuscitate: Secondary | ICD-10-CM

## 2014-09-01 DIAGNOSIS — R51 Headache: Secondary | ICD-10-CM

## 2014-09-01 LAB — SODIUM
SODIUM: 123 mmol/L — AB (ref 135–145)
Sodium: 123 mmol/L — ABNORMAL LOW (ref 135–145)
Sodium: 122 mmol/L — ABNORMAL LOW (ref 135–145)
Sodium: 125 mmol/L — ABNORMAL LOW (ref 135–145)

## 2014-09-01 LAB — GLUCOSE, CAPILLARY: Glucose-Capillary: 105 mg/dL — ABNORMAL HIGH (ref 70–99)

## 2014-09-01 LAB — BASIC METABOLIC PANEL
ANION GAP: 6 (ref 5–15)
BUN: 9 mg/dL (ref 6–20)
CALCIUM: 8.1 mg/dL — AB (ref 8.9–10.3)
CO2: 26 mmol/L (ref 22–32)
CREATININE: 0.51 mg/dL (ref 0.44–1.00)
Chloride: 91 mmol/L — ABNORMAL LOW (ref 101–111)
GFR calc Af Amer: >60 mL/min (ref >60)
GFR calc non Af Amer: >60 mL/min (ref >60)
Glucose, Bld: 111 mg/dL — ABNORMAL HIGH (ref 65–99)
Potassium: 3.5 mmol/L (ref 3.5–5.1)
Sodium: 123 mmol/L — ABNORMAL LOW (ref 135–145)

## 2014-09-01 MED ORDER — MORPHINE SULFATE 2 MG/ML IJ SOLN
1.0000 mg | Freq: Once | INTRAMUSCULAR | Status: AC
  Administered 2014-09-01: 2 mg via INTRAVENOUS
  Filled 2014-09-01 (×3): qty 1

## 2014-09-01 MED ORDER — SODIUM CHLORIDE 3 % IV SOLN
INTRAVENOUS | Status: AC
  Administered 2014-09-01: 30 mL/h via INTRAVENOUS
  Filled 2014-09-01: qty 500

## 2014-09-01 NOTE — Consult Note (Signed)
Palliative Medicine Inpatient Consult Follow Up Note   Name: Taron Conrey Date: 09/01/2014 MRN: [id number]  DOB: [birth year]  Referring Physician: Henreitta Leber, MD  Palliative Care consult requested for this 81 y.o. female for goals of medical therapy in patient with PBC s/p liver transplant, CAD s/p CABG, mod-severe AS/mod MS, HTN, CVA, a.fib, temporal arteritis  Ms Manship is lying in bed, appears very uncomfortable. Daughter at bedside and says pt has severe headache. Intermittently confused.    REVIEW OF SYSTEMS:  Patient is not able to provide ROS  CODE STATUS: DNR   PAST MEDICAL HISTORY: Past Medical History  Diagnosis Date  . Liver transplanted   . Macular degeneration   . Coronary artery disease   . Hypertension     PAST SURGICAL HISTORY:  Past Surgical History  Procedure Laterality Date  . Coronary artery bypass graft      Vital Signs: BP 170/91 mmHg  Pulse 82  Temp(Src) 97.6 F (36.4 C) (Oral)  Resp 19  Ht 5\' 5"  (1.651 m)  Wt 65.227 kg (143 lb 12.8 oz)  BMI 23.93 kg/m2  SpO2 99% Filed Weights   08/30/14 0533  Weight: 65.227 kg (143 lb 12.8 oz)    Estimated body mass index is 23.93 kg/(m^2) as calculated from the following:   Height as of this encounter: 5\' 5"  (1.651 m).   Weight as of this encounter: 65.227 kg (143 lb 12.8 oz).  PHYSICAL EXAM: General: ill-appearing HEENT: OP clear, decreased hearing Neck: Trachea midline  Cardiovascular: regular rate and rhythm Pulmonary/Chest: Clear ant fields Abdominal: Soft, NTTP hypoactive bowel sounds GU: No SP tenderness Extremities: No edema  Neurological: moves extremities, does not follow commands Skin: no rashes Psychiatric: confused, unable to say daughter's name  LABS: CBC:    Component Value Date/Time   WBC 8.9 08/30/2014 0430   WBC 6.6 12/03/2013 2039   HGB 9.6* 08/30/2014 0430   HGB 11.1* 12/03/2013 2039   HCT 30.1* 08/30/2014 0430   HCT 35.6 12/03/2013 2039   PLT 298 08/30/2014  0430   PLT 292 12/03/2013 2039   MCV 81.4 08/30/2014 0430   MCV 89 12/03/2013 2039   NEUTROABS 9.4* 08/27/2014 2000   NEUTROABS 3.5 12/27/2012 0554   LYMPHSABS 0.6* 08/27/2014 2000   LYMPHSABS 1.6 12/27/2012 0554   MONOABS 0.8 08/27/2014 2000   MONOABS 0.6 12/27/2012 0554   EOSABS 0.0 08/27/2014 2000   EOSABS 0.1 12/27/2012 0554   BASOSABS 0.0 08/27/2014 2000   BASOSABS 0.0 12/27/2012 0554   Comprehensive Metabolic Panel:    Component Value Date/Time   NA 123* 09/01/2014 0621   NA 137 12/03/2013 2039   K 3.5 09/01/2014 0621   K 4.2 12/03/2013 2039   CL 91* 09/01/2014 0621   CL 101 12/03/2013 2039   CO2 26 09/01/2014 0621   CO2 31 12/03/2013 2039   BUN 9 09/01/2014 0621   BUN 22* 12/03/2013 2039   CREATININE 0.51 09/01/2014 0621   CREATININE 1.16 12/03/2013 2039   GLUCOSE 111* 09/01/2014 0621   GLUCOSE 113* 12/03/2013 2039   CALCIUM 8.1* 09/01/2014 0621   CALCIUM 9.8 12/03/2013 2039   AST 18 08/29/2014 1605   AST 24 12/03/2013 2039   ALT 12* 08/29/2014 1605   ALT 32 12/03/2013 2039   ALKPHOS 59 08/29/2014 1605   ALKPHOS 92 12/03/2013 2039   BILITOT 0.5 08/29/2014 1605   PROT 6.1* 08/29/2014 1605   PROT 7.3 12/03/2013 2039   ALBUMIN 3.2* 08/29/2014 1605   ALBUMIN  3.8 12/03/2013 2039    IMPRESSION: Ms Barber is an 81 yo woman with PMH of PBC s/p liver transplant, CAD s/p CABG, mod-severe AS/mod MS, HTN, CVA, a.fib, temporal arteritis. She was hospitalized 5/2-08/24/14 with UTI, d/c'd to home where she fell resulting in a L.humerus fx. She went to SNF for STR and was readmitted from SNF on 5/8 with shortness of breath and altered mental status. She is being tx'd for presumed pneumonia.   Pt having severe headache. Discussed with daughter and will try 1mg  morphine x 1 to see if this helps. Nephrology following and are going to tx hyponatremia to see if this improves pain/mentation. Will also consider repeat MRI. Family aware of plan and in agreement.   PLAN: Morphine 1mg   IV now x 1  More than 50% of the visit was spent in counseling/coordination of care: YES  Time spent: 35 minutes

## 2014-09-01 NOTE — Progress Notes (Signed)
Pharmacy consulted to monitor sodiums in this 81yo F being treated for hyponatremia with hypertonic saline at rate of 73ml/hr  Current Na: 123  Will order serial sodium every 2 hours per dosing guidelines and watch for rise in Na of more than 23meq in 24hrs.   Pharmacy will continue to follow.

## 2014-09-01 NOTE — Progress Notes (Signed)
Palliative Care is following patient. Patient's blue medicare authorization will expire tomorrow. If patent goes to [facility] a new blue medicare authorization will have to be started. Clinical Social Worker (CSW) will continue to follow and assist as needed.   Blima Rich, [place] [phone]

## 2014-09-01 NOTE — Progress Notes (Signed)
PT Cancellation Note  Patient Details Name: Brenda Roman MRN: [id number] DOB: 1923/05/05   Cancelled Treatment:    Reason Eval/Treat Not Completed: Medical issues which prohibited therapy. Chart reviewed, RN consulted. RN reports that patient has become unusually lethargic and is inappropriate for therapy at this time. Will attempt treatment at later date/time.    Elaura Calix C 09/01/2014, 11:41 AM  Etta Grandchild, PT, DPT, BM

## 2014-09-01 NOTE — Progress Notes (Signed)
[place] [facility] [place] NAME: Brenda Roman    MR#:  [id number]  DATE OF BIRTH:  02/07/1924  SUBJECTIVE:  CHIEF COMPLAINT:   Chief Complaint  Patient presents with  . Shortness of Breath    dx with pneumonia yesterday - in rehab for left humerus fx   Pt. More lethargic today and PO intake remains very poor. Family at bedside.  No shortness of breath, fever.  Complaining of a headache  REVIEW OF SYSTEMS:    Review of Systems  Constitutional: Negative for fever and chills.  HENT: Negative for congestion and tinnitus.   Eyes: Negative for blurred vision and double vision.  Respiratory: Negative for cough, shortness of breath and wheezing.   Cardiovascular: Negative for chest pain, orthopnea and PND.  Gastrointestinal: Negative for nausea, vomiting, abdominal pain and diarrhea.  Genitourinary: Negative for dysuria and hematuria.  Neurological: Positive for weakness (generalized) and headaches. Negative for dizziness, sensory change and focal weakness.    Nutrition: Regular diet Tolerating Diet: Very little.  Tolerating PT: Yes     DRUG ALLERGIES:   Allergies  Allergen Reactions  . Cephalexin Other (See Comments)    Reaction:  Unknown  . Novocain [Procaine] Rash  . Sulfa Antibiotics Rash    VITALS:  Blood pressure 170/91, pulse 82, temperature 97.6 F (36.4 C), temperature source Oral, resp. rate 19, height 5\' 5"  (1.651 m), weight 65.227 kg (143 lb 12.8 oz), SpO2 99 %.  PHYSICAL EXAMINATION:   Physical Exam  GENERAL:  81 y.o.-year-old patient lying in the bed lethargic/Encephalopathic but in NAD EYES: Pupils equal, round, reactive to light and accommodation. No scleral icterus. Extraocular muscles intact.  HEENT: Head atraumatic, normocephalic. Oropharynx and nasopharynx clear. Dry oral mucosa  NECK:  Supple, no jugular venous distention. No thyroid enlargement, no tenderness.  LUNGS: Normal breath sounds  bilaterally. No rales, rhonchi, wheezing. (-) use of accessory muscles. CARDIOVASCULAR: S1, S2 normal. II/VI SEM at RSB, No rubs, or gallops.  ABDOMEN: Soft, nontender, nondistended. Bowel sounds present. No organomegaly or mass.  EXTREMITIES: No cyanosis, clubbing or edema b/l.   Left shoulder in sling due to recent fracture NEUROLOGIC: Cranial nerves II through XII are intact. No focal Motor or sensory deficits b/l.  Globally weak/Encephalopathic PSYCHIATRIC: The patient is alert and oriented x 1.  Flat affect.   SKIN: No obvious rash, lesion, or ulcer.    LABORATORY PANEL:   CBC  Recent Labs Lab 08/30/14 0430  WBC 8.9  HGB 9.6*  HCT 30.1*  PLT 298   ------------------------------------------------------------------------------------------------------------------  Chemistries   Recent Labs Lab 08/27/14 2000  08/29/14 1605  09/01/14 0621  NA 124*  < > 125*  < > 123*  K 2.5*  < > 5.0  < > 3.5  CL 88*  < > 90*  < > 91*  CO2 26  < > 27  < > 26  GLUCOSE 93  < > 130*  < > 111*  BUN 12  < > 9  < > 9  CREATININE 0.65  < > 0.58  < > 0.51  CALCIUM 7.8*  < > 8.0*  < > 8.1*  MG 1.9  --   --   --   --   AST 20  --  18  --   --   ALT 14  --  12*  --   --   ALKPHOS 51  --  59  --   --  BILITOT 0.8  --  0.5  --   --   < > = values in this interval not displayed. ------------------------------------------------------------------------------------------------------------------  Cardiac Enzymes No results for input(s): TROPONINI in the last 168 hours. ------------------------------------------------------------------------------------------------------------------  RADIOLOGY:  No results found.   ASSESSMENT AND PLAN:   81 yo female w/ hx of Primary Billiary Cirrhosis s/p Liver Transplant, hx of recent TIA, chronic a. Fib who presented to the hospital w/ shortness of breath.   * Pneumonia - suspected cause of pt's shortness of breath. Although pt's CXR (-) for pneumonia.  -  will cont. Levaquin and cultures so far (-).  - afebrile, hemodynamically stable.   * Hyponatremia - likely euvolemic hyponatremia.   -  Urine sodium > 10. Urine Osm > 100 and serum Osm 259.  ?? SIADH (vs) adrenal insufficiency.  - will d/c Fluids.  Nephro consult.  Consider starting pt. On Samsca but await renal input.  - check Cortisol level in a.m.   * AMS/Encephalopathy - etiology unclear but likely metabolic encephalopathy from hyponatremia.  - will correct sodium and follow mental status.  - hold off on CT head at this time.  If sodium improved and mental status not improving consider CT head and maybe Neuro consult.   * hx of Liver Transplant - cont. Cellcept, Prednisone.   * hx of Chronic a. Fib - rate controlled.  - cont. Toprol. Cont. Xarelto.   * Adult Failure to Thrive - cont. Ensure supplements.  - cont. Megace.   Palliative Care following and pt. Remains DNR   Discussed plan of care w/ pt. And family at bedside and also Dr. Candiss Norse (nephrology)    All the records are reviewed and case discussed with Care Management/Social Workerr. Management plans discussed with the patient, family and they are in agreement.  CODE STATUS: DNR  DVT Prophylaxis: Xarelto.   TOTAL TIME TAKING CARE OF THIS PATIENT: 35 minutes.   PT recommending SNF and possible d/c there once sodium improved and mental status improves.   Henreitta Leber M.D on 09/01/2014 at 2:36 PM  Between 7am to 6pm - Pager - [phone]  After 6pm go to [email or web address] - password [email or web address] [facility]  Office  [phone]  CC: Primary care physician; Kirk Ruths., MD

## 2014-09-01 NOTE — Consult Note (Signed)
Date: 09/01/2014                  Patient Name:  Brenda Roman  MRN: [id number]  DOB: 08-23-1923  Age / Sex: 81 y.o., female         PCP: Kirk Ruths., MD                 Service Requesting Consult: Int Medicine                 Reason for Consult: Hyponatremia            History of Present Illness: Patient is a 81 y.o. female with a PMHx of significant for ischemic heart disease, non-Q-wave MI, atrial fibrillation, temporal arteritis, liver transplant on CellCept and steroids, TIA who presents from her PCPs office after 1 week of weakness.   Renal consult has been requested for Hyponatremia. Patient has had a complicated last 2 weeks. As related by her daughter, it all started with UTI. She was prescribed Abx. At the end of the week she was c/o headache. She was evaluated after the 1st weekend- she had slurred speech, gibberish language.  CT, MRI were done. Results were reviewed.  She was sent home with hospital bed, home health. She rolled off the bed and broke her left arm.  She has had poor po intake She presented this time with delirium Nephrology consult for Hyponatremia   Medications: Outpatient medications: Prescriptions prior to admission  Medication Sig Dispense Refill Last Dose  . aspirin EC 81 MG tablet Take 81 mg by mouth 2 (two) times daily.    08/29/2014 at am  . beta carotene w/minerals (OCUVITE) tablet Take 1 tablet by mouth 2 (two) times daily.   08/29/2014 at am  . Cholecalciferol (VITAMIN D3) 1000 UNITS CAPS Take 2 capsules by mouth daily.   unknown at Unknown   . metoprolol succinate (TOPROL-XL) 50 MG 24 hr tablet Take 50-100 mg by mouth 2 (two) times daily. Pt takes 2 tabs (100mg ) every morning, and 1 tab (50mg ) every evening   08/29/2014 at  0800  . mycophenolate (CELLCEPT) 500 MG tablet Take 500 mg by mouth every 12 (twelve) hours.   08/29/2014 at 0800  . rivaroxaban (XARELTO) 20 MG TABS tablet Take 20 mg by mouth daily with supper.   08/28/2014 at pm  .  CALCIUM CARBONATE PO Take 1 tablet by mouth daily.   unknown at unknown  . ezetimibe-simvastatin (VYTORIN) 10-20 MG per tablet Take 1 tablet by mouth at bedtime.   08/27/2014 at pm  . nitroGLYCERIN (NITROSTAT) 0.4 MG SL tablet Place 0.4 mg under the tongue every 5 (five) minutes as needed for chest pain.   prn at prn  . ondansetron (ZOFRAN) 4 MG tablet Take 4 mg by mouth every 8 (eight) hours as needed for nausea or vomiting.   prn at prn  . predniSONE (DELTASONE) 5 MG tablet Take 5 mg by mouth daily.   Completed Course at Unknown time  . zolpidem (AMBIEN) 5 MG tablet Take 5 mg by mouth at bedtime as needed for sleep.    prn at prn    Current medications: Current Facility-Administered Medications  Medication Dose Route Frequency Provider Last Rate Last Dose  . acetaminophen (TYLENOL) tablet 650 mg  650 mg Oral Q6H PRN Demetrios Loll, MD   650 mg at 09/01/14 4431   Or  . acetaminophen (TYLENOL) suppository 650 mg  650 mg Rectal Q6H PRN Demetrios Loll, MD      .  albuterol (PROVENTIL) (2.5 MG/3ML) 0.083% nebulizer solution 2.5 mg  2.5 mg Nebulization Q2H PRN Demetrios Loll, MD      . aspirin EC tablet 81 mg  81 mg Oral BID Demetrios Loll, MD   81 mg at 09/01/14 0852  . bisacodyl (DULCOLAX) suppository 10 mg  10 mg Rectal Daily PRN Henreitta Leber, MD   10 mg at 08/30/14 0915  . calcium carbonate (TUMS - dosed in mg elemental calcium) chewable tablet 200 mg of elemental calcium  1 tablet Oral PRN Henreitta Leber, MD   200 mg of elemental calcium at 08/30/14 1741  . feeding supplement (ENSURE ENLIVE) (ENSURE ENLIVE) liquid 237 mL  237 mL Oral BID BM Henreitta Leber, MD   237 mL at 08/31/14 0918  . [START ON 09/02/2014] levofloxacin (LEVAQUIN) tablet 500 mg  500 mg Oral Q48H Henreitta Leber, MD      . megestrol (MEGACE) 400 MG/10ML suspension 400 mg  400 mg Oral BID Henreitta Leber, MD   400 mg at 09/01/14 1303  . metoprolol succinate (TOPROL-XL) 24 hr tablet 50 mg  50 mg Oral BID Demetrios Loll, MD   50 mg at 09/01/14 0853  .  morphine 2 MG/ML injection 1 mg  1 mg Intravenous Once [place] Jack Phifer, MD      . mycophenolate (CELLCEPT) capsule 500 mg  500 mg Oral BID Henreitta Leber, MD   500 mg at 09/01/14 0851  . nitroGLYCERIN (NITROSTAT) SL tablet 0.4 mg  0.4 mg Sublingual Q5 min PRN Demetrios Loll, MD      . ondansetron [facility]) tablet 4 mg  4 mg Oral Q6H PRN Demetrios Loll, MD       Or  . ondansetron [facility]) injection 4 mg  4 mg Intravenous Q6H PRN Demetrios Loll, MD   4 mg at 08/31/14 1902  . oxyCODONE (Oxy IR/ROXICODONE) immediate release tablet 5 mg  5 mg Oral Q6H PRN Henreitta Leber, MD   5 mg at 08/31/14 2243  . predniSONE (DELTASONE) tablet 5 mg  5 mg Oral Daily Demetrios Loll, MD   5 mg at 09/01/14 1303  . senna-docusate (Senokot-S) tablet 1 tablet  1 tablet Oral QHS PRN Demetrios Loll, MD   1 tablet at 08/29/14 2359  . sodium chloride (hypertonic) 3 % solution   Intravenous Continuous Knight Oelkers, MD      . zolpidem (AMBIEN) tablet 5 mg  5 mg Oral QHS PRN Lytle Butte, MD   5 mg at 09/01/14 0013      Allergies: Allergies  Allergen Reactions  . Cephalexin Other (See Comments)    Reaction:  Unknown  . Novocain [Procaine] Rash  . Sulfa Antibiotics Rash      Past Medical History: Past Medical History  Diagnosis Date  . Liver transplanted   . Macular degeneration   . Coronary artery disease   . Hypertension      Past Surgical History: Past Surgical History  Procedure Laterality Date  . Coronary artery bypass graft       Family History: History reviewed. No pertinent family history.   Social History: History   Social History  . Marital Status: Married    Spouse Name: N/A  . Number of Children: N/A  . Years of Education: N/A   Occupational History  . Not on file.   Social History Main Topics  . Smoking status: Former Research scientist (life sciences)  . Smokeless tobacco: Not on file  . Alcohol Use: No  .  Drug Use: No  . Sexual Activity: No   Other Topics Concern  . Not on file   Social History Narrative     Review  of Systems: As per HPI Patient not able to provide much information due to lethargy  Vital Signs: Blood pressure 140/78, pulse 79, temperature 97.8 F (36.6 C), temperature source Oral, resp. rate 18, height 5\' 5"  (1.651 m), weight 65.227 kg (143 lb 12.8 oz), SpO2 99 %.  Weight trends: Filed Weights   08/30/14 0533  Weight: 65.227 kg (143 lb 12.8 oz)    Physical Exam: General: Critically ill appearing  Head: Normocephalic, atraumatic.  Eyes: Anicteric, sunken eyes  Nose: Dry mucus membranes , not inflammed, nonerythematous.  Throat: Oropharynx nonerythematous, no exudate appreciated.   Neck: No deformities, masses, Supple,  Lungs:  poor respiratory effort.  Heart: Ireegular, no gallops or rubs.  Abdomen:  BS normoactive. Soft, Nondistended, non-tender.  No masses or organomegaly.  Extremities: No pretibial edema.  Neurologic: Lethargic, following few simple commands only  Skin: No visible rashes, scars.    Lab results: Basic Metabolic Panel:  Recent Labs Lab 08/27/14 2000  08/29/14 1605 08/30/14 0430 09/01/14 0621 09/01/14 1524  NA 124*  < > 125* 125* 123* 123*  K 2.5*  < > 5.0 4.4 3.5  --   CL 88*  < > 90* 92* 91*  --   CO2 26  < > 27 26 26   --   GLUCOSE 93  < > 130* 126* 111*  --   BUN 12  < > 9 7 9   --   CREATININE 0.65  < > 0.58 0.47 0.51  --   CALCIUM 7.8*  < > 8.0* 7.4* 8.1*  --   MG 1.9  --   --   --   --   --   < > = values in this interval not displayed.  Liver Function Tests:  Recent Labs Lab 08/27/14 2000 08/29/14 1605  AST 20 18  ALT 14 12*  ALKPHOS 51 59  BILITOT 0.8 0.5  PROT 5.9* 6.1*  ALBUMIN 3.4* 3.2*   No results for input(s): LIPASE, AMYLASE in the last 168 hours. No results for input(s): AMMONIA in the last 168 hours.  CBC:  Recent Labs Lab 08/27/14 2000 08/29/14 1605 08/30/14 0430  WBC 10.9 11.7* 8.9  NEUTROABS 9.4*  --   --   HGB 9.7* 10.1* 9.6*  HCT 29.5* 31.9* 30.1*  MCV 81.0 81.0 81.4  PLT 266 351 298    Cardiac  Enzymes: No results for input(s): CKTOTAL, CKMB, CKMBINDEX, TROPONINI in the last 168 hours.  BNP: Invalid input(s): POCBNP  CBG: No results for input(s): GLUCAP in the last 168 hours.  Microbiology: Results for orders placed or performed during the hospital encounter of 08/29/14  Culture, blood (routine x 2)     Status: None (Preliminary result)   Collection Time: 08/29/14  4:00 PM  Result Value Ref Range Status   Specimen Description BLOOD  Final   Special Requests BLOOD  Final   Culture NO GROWTH 3 DAYS  Final   Report Status PENDING  Incomplete  Culture, blood (routine x 2)     Status: None (Preliminary result)   Collection Time: 08/29/14  4:05 PM  Result Value Ref Range Status   Specimen Description BLOOD  Final   Special Requests BLOOD  Final   Culture NO GROWTH 3 DAYS  Final   Report Status PENDING  Incomplete  Coagulation Studies: No results for input(s): LABPROT, INR in the last 72 hours.  Urinalysis: No results for input(s): COLORURINE, LABSPEC, PHURINE, GLUCOSEU, HGBUR, BILIRUBINUR, KETONESUR, PROTEINUR, UROBILINOGEN, NITRITE, LEUKOCYTESUR in the last 72 hours.  Invalid input(s): APPERANCEUR    Imaging:  No results found.   Assessment & Plan: Patient is a 81 y.o. female with a PMHx of significant for ischemic heart disease, non-Q-wave MI, atrial fibrillation, temporal arteritis, liver transplant on CellCept and steroids, TIA   1. Hyponatremia - DDx includes SIADH, dehydration, Adrenal insufficiancy - urine Osm > S Osm  Plan: 3% saline via CVL x 4-5 hrs - monitor Na closely - will try to correct sodium and see if it makes any difference in mental status - may need imaging if no improvement since she c/o severe headaches

## 2014-09-01 NOTE — Progress Notes (Signed)
Brenda Roman unable to sleep well through the night, family at bedside all night. Pain controlled with PRN medications. Left arm remains in sling, positioned with pillows. Voiding with urgency through the night. Nursing continues to monitor and assist with ADLs.

## 2014-09-01 NOTE — Outcomes Assessment (Signed)
Pt  Was lethargic this AM but was able to communicate and have eat yogurt. She became more lethargic at around 1030. I called Dr Verdell Carmine and orders were received. Family is aware of plan of care and interventions.

## 2014-09-02 DIAGNOSIS — E871 Hypo-osmolality and hyponatremia: Secondary | ICD-10-CM

## 2014-09-02 DIAGNOSIS — F329 Major depressive disorder, single episode, unspecified: Secondary | ICD-10-CM

## 2014-09-02 LAB — BASIC METABOLIC PANEL
Anion gap: 5 (ref 5–15)
BUN: 14 mg/dL (ref 6–20)
CALCIUM: 8.6 mg/dL — AB (ref 8.9–10.3)
CO2: 28 mmol/L (ref 22–32)
CREATININE: 0.62 mg/dL (ref 0.44–1.00)
Chloride: 92 mmol/L — ABNORMAL LOW (ref 101–111)
GFR calc Af Amer: >60 mL/min (ref >60)
Glucose, Bld: 91 mg/dL (ref 65–99)
Potassium: 3.8 mmol/L (ref 3.5–5.1)
Sodium: 125 mmol/L — ABNORMAL LOW (ref 135–145)

## 2014-09-02 LAB — SODIUM
SODIUM: 124 mmol/L — AB (ref 135–145)
Sodium: 123 mmol/L — ABNORMAL LOW (ref 135–145)
Sodium: 124 mmol/L — ABNORMAL LOW (ref 135–145)
Sodium: 126 mmol/L — ABNORMAL LOW (ref 135–145)
Sodium: 126 mmol/L — ABNORMAL LOW (ref 135–145)

## 2014-09-02 LAB — GLUCOSE, CAPILLARY: GLUCOSE-CAPILLARY: 83 mg/dL (ref 65–99)

## 2014-09-02 MED ORDER — SODIUM CHLORIDE 3 % IV SOLN
INTRAVENOUS | Status: AC
  Administered 2014-09-02: 30 mL/h via INTRAVENOUS
  Filled 2014-09-02 (×2): qty 500

## 2014-09-02 NOTE — Consult Note (Signed)
Palliative Medicine Inpatient Consult Follow Up Note   Name: Brenda Roman Date: 09/02/2014 MRN: [id number]  DOB: 02-09-1924  Referring Physician: Henreitta Leber, MD  Palliative Care consult requested for this 81 y.o. female for goals of medical therapy in patient with PBC s/p liver transplant, CAD s/p CABG, mod-severe AS/mod MS, HTN, CVA, a.fib, temporal arteritis  Ms Wilhide is sitting up in bed watching TV. Denies pain. Daughter at bedside.     REVIEW OF SYSTEMS:  Pain: None Dyspnea:  No Nausea/Vomiting:  No Diarrhea:  No Constipation:   No Depression:   No Anxiety:   No Fatigue:   Yes  CODE STATUS: DNR   PAST MEDICAL HISTORY: Past Medical History  Diagnosis Date  . Liver transplanted   . Macular degeneration   . Coronary artery disease   . Hypertension     PAST SURGICAL HISTORY:  Past Surgical History  Procedure Laterality Date  . Coronary artery bypass graft      Vital Signs: BP 134/57 mmHg  Pulse 78  Temp(Src) 98.2 F (36.8 C) (Axillary)  Resp 19  Ht 5\' 5"  (1.651 m)  Wt 65.227 kg (143 lb 12.8 oz)  BMI 23.93 kg/m2  SpO2 98% Filed Weights   08/30/14 0533  Weight: 65.227 kg (143 lb 12.8 oz)    Estimated body mass index is 23.93 kg/(m^2) as calculated from the following:   Height as of this encounter: 5\' 5"  (1.651 m).   Weight as of this encounter: 65.227 kg (143 lb 12.8 oz).  PHYSICAL EXAM: Generall: frail but NAD HEENT: OP clear, hearing impaired Neck: Trachea midline  Cardiovascular: regular rate and rhythm Pulmonary/Chest: Clear ant fields Abdomin: Soft, NTTP, + bowel sounds GU: No SP tenderness Extremities: No edema  Neurological: Grossly nonfocal Skin: no rashes Psychiatric: flat affect, oriented to person, place, knows daughter  LABS: CBC:    Component Value Date/Time   WBC 8.9 08/30/2014 0430   WBC 6.6 12/03/2013 2039   HGB 9.6* 08/30/2014 0430   HGB 11.1* 12/03/2013 2039   HCT 30.1* 08/30/2014 0430   HCT 35.6 12/03/2013 2039    PLT 298 08/30/2014 0430   PLT 292 12/03/2013 2039   MCV 81.4 08/30/2014 0430   MCV 89 12/03/2013 2039   NEUTROABS 9.4* 08/27/2014 2000   NEUTROABS 3.5 12/27/2012 0554   LYMPHSABS 0.6* 08/27/2014 2000   LYMPHSABS 1.6 12/27/2012 0554   MONOABS 0.8 08/27/2014 2000   MONOABS 0.6 12/27/2012 0554   EOSABS 0.0 08/27/2014 2000   EOSABS 0.1 12/27/2012 0554   BASOSABS 0.0 08/27/2014 2000   BASOSABS 0.0 12/27/2012 0554   Comprehensive Metabolic Panel:    Component Value Date/Time   NA 126* 09/02/2014 0905   NA 137 12/03/2013 2039   K 3.8 09/02/2014 0620   K 4.2 12/03/2013 2039   CL 92* 09/02/2014 0620   CL 101 12/03/2013 2039   CO2 28 09/02/2014 0620   CO2 31 12/03/2013 2039   BUN 14 09/02/2014 0620   BUN 22* 12/03/2013 2039   CREATININE 0.62 09/02/2014 0620   CREATININE 1.16 12/03/2013 2039   GLUCOSE 91 09/02/2014 0620   GLUCOSE 113* 12/03/2013 2039   CALCIUM 8.6* 09/02/2014 0620   CALCIUM 9.8 12/03/2013 2039   AST 18 08/29/2014 1605   AST 24 12/03/2013 2039   ALT 12* 08/29/2014 1605   ALT 32 12/03/2013 2039   ALKPHOS 59 08/29/2014 1605   ALKPHOS 92 12/03/2013 2039   BILITOT 0.5 08/29/2014 1605   PROT 6.1* 08/29/2014 1605  PROT 7.3 12/03/2013 2039   ALBUMIN 3.2* 08/29/2014 1605   ALBUMIN 3.8 12/03/2013 2039    IMPRESSION: Ms Bessinger is an 81 yo woman with PMH of PBC s/p liver transplant, CAD s/p CABG, mod-severe AS/mod MS, HTN, CVA, a.fib, temporal arteritis. She was hospitalized 5/2-08/24/14 with UTI, d/c'd to home where she fell resulting in a L.humerus fx. She went to SNF for STR and was readmitted from SNF on 5/8 with shortness of breath and altered mental status. She is being tx'd for presumed pneumonia. Hospital course complicated by hyponatremia and intractable headache.  Pt much more alert today and denies pain. Sodium being slowly corrected, last check 126. Pt received 1mg  morphine last PM with no untoward side effects and relief of pain. Will ask that MD be called  if recurrent pain instead of ordering prn med.   PLAN: As above  More than 50% of the visit was spent in counseling/coordination of care: YES  Time spent: 25 minutes

## 2014-09-02 NOTE — Progress Notes (Signed)
Subjective:   Much more alert and conversive Ate good breakfast Denies any pain Na 126 @ 9 am  Objective:  Vital signs in last 24 hours:  Temp:  [97.3 F (36.3 C)-98.4 F (36.9 C)] 98.2 F (36.8 C) (05/12 1135) Pulse Rate:  [77-81] 78 (05/12 1135) Resp:  [16-19] 19 (05/12 1135) BP: (100-152)/(57-81) 134/57 mmHg (05/12 1135) SpO2:  [97 %-100 %] 98 % (05/12 1135)  Weight change:  Filed Weights   08/30/14 0533  Weight: 65.227 kg (143 lb 12.8 oz)    Intake/Output: I/O last 3 completed shifts: In: 3536 [P.O.:110; I.V.:1565] Out: 500 [Urine:500]   Intake/Output this shift:     Physical Exam: General: NAD,   Head: Normocephalic, atraumatic. dry oral mucosal membranes  Eyes: Anicteric,   Neck: Supple, trachea midline  Lungs:  Clear to auscultation  Heart: Regular rate and rhythm  Abdomen:  Soft, nontender,   Extremities:  no peripheral edema.  Neurologic: Nonfocal, moving all four extremities  Skin: No lesions  Access:     Basic Metabolic Panel:  Recent Labs Lab 08/27/14 2000 08/29/14 0900 08/29/14 1605 08/30/14 0430 09/01/14 1443  09/02/14 0015 09/02/14 0206 09/02/14 0430 09/02/14 0620 09/02/14 0905  NA 124* 128* 125* 125* 123*  < > 123* 124* 124* 125* 126*  K 2.5* 4.0 5.0 4.4 3.5  --   --   --   --  3.8  --   CL 88* 97* 90* 92* 91*  --   --   --   --  92*  --   CO2 26 23 27 26 26   --   --   --   --  28  --   GLUCOSE 93 82 130* 126* 111*  --   --   --   --  91  --   BUN 12 8 9 7 9   --   --   --   --  14  --   CREATININE 0.65 0.49 0.58 0.47 0.51  --   --   --   --  0.62  --   CALCIUM 7.8* 7.1* 8.0* 7.4* 8.1*  --   --   --   --  8.6*  --   MG 1.9  --   --   --   --   --   --   --   --   --   --   < > = values in this interval not displayed.  Liver Function Tests:  Recent Labs Lab 08/27/14 2000 08/29/14 1605  AST 20 18  ALT 14 12*  ALKPHOS 51 59  BILITOT 0.8 0.5  PROT 5.9* 6.1*  ALBUMIN 3.4* 3.2*   No results for input(s): LIPASE, AMYLASE in  the last 168 hours. No results for input(s): AMMONIA in the last 168 hours.  CBC:  Recent Labs Lab 08/27/14 2000 08/29/14 1605 08/30/14 0430  WBC 10.9 11.7* 8.9  NEUTROABS 9.4*  --   --   HGB 9.7* 10.1* 9.6*  HCT 29.5* 31.9* 30.1*  MCV 81.0 81.0 81.4  PLT 266 351 298    Cardiac Enzymes: No results for input(s): CKTOTAL, CKMB, CKMBINDEX, TROPONINI in the last 168 hours.  BNP: Invalid input(s): POCBNP  CBG:  Recent Labs Lab 09/01/14 2030 09/02/14 0732  GLUCAP 105* 107    Microbiology: Results for orders placed or performed during the hospital encounter of 08/29/14  Culture, blood (routine x 2)     Status: None (Preliminary result)  Collection Time: 08/29/14  4:00 PM  Result Value Ref Range Status   Specimen Description BLOOD  Final   Special Requests BLOOD  Final   Culture NO GROWTH 4 DAYS  Final   Report Status PENDING  Incomplete  Culture, blood (routine x 2)     Status: None (Preliminary result)   Collection Time: 08/29/14  4:05 PM  Result Value Ref Range Status   Specimen Description BLOOD  Final   Special Requests BLOOD  Final   Culture NO GROWTH 4 DAYS  Final   Report Status PENDING  Incomplete    Coagulation Studies: No results for input(s): LABPROT, INR in the last 72 hours.  Urinalysis: No results for input(s): COLORURINE, LABSPEC, PHURINE, GLUCOSEU, HGBUR, BILIRUBINUR, KETONESUR, PROTEINUR, UROBILINOGEN, NITRITE, LEUKOCYTESUR in the last 72 hours.  Invalid input(s): APPERANCEUR    Imaging: Dg Chest Port 1 View  09/01/2014   CLINICAL DATA:  PICC line placement.  EXAM: PORTABLE CHEST - 1 VIEW  COMPARISON:  Aug 29, 2014.  FINDINGS: Stable cardiomegaly and central pulmonary vascular congestion. No pneumothorax is noted. Mild left basilar opacity is noted concerning for edema or atelectasis with associated effusion. Status post coronary artery bypass graft. Interval placement of right-sided PICC line with distal tip overlying expected position of SVC.   IMPRESSION: Interval placement of right-sided PICC line with distal tip overlying expected position of SVC.   Electronically Signed   By: Marijo Conception, M.D.   On: 09/01/2014 18:26     Medications:   . sodium chloride (hypertonic)     . aspirin EC  81 mg Oral BID  . feeding supplement (ENSURE ENLIVE)  237 mL Oral BID BM  . levofloxacin  500 mg Oral Q48H  . megestrol  400 mg Oral BID  . metoprolol succinate  50 mg Oral BID  . mycophenolate  500 mg Oral BID  . predniSONE  5 mg Oral Daily   acetaminophen **OR** acetaminophen, albuterol, bisacodyl, calcium carbonate, nitroGLYCERIN, ondansetron **OR** ondansetron (ZOFRAN) IV, oxyCODONE, senna-docusate, zolpidem  Assessment/ Plan:  81 y.o. female with a PMHx of significant for ischemic heart disease, non-Q-wave MI, atrial fibrillation, temporal arteritis, liver transplant on CellCept and steroids, TIA   1. Hyponatremia - DDx includes SIADH, dehydration, Adrenal insufficiency, tea/toast diet - urine Osm > S Osm - improved some with 3% saline Plan: 3% saline via CVL x 1 day. D/c drip once Na is 132 or higher - monitor Na closely    LOS: 4 Leslye Puccini 5/12/201612:08 PM

## 2014-09-02 NOTE — Progress Notes (Signed)
Physical Therapy Treatment Patient Details Name: Brenda Roman MRN: [id number] DOB: 1923/07/05 Today's Date: 09/02/2014    History of Present Illness 81 yo female with onset of   L humeral fracture and new PNA with weakness and plan to return to inpt therapy when done with this stay.  PMHx: liver transplant, cirrhosis, a-fib    PT Comments    Pt extremely lethargic during session, but able to participate with continual cues to awaken and stay on task during exercises. Pt oriented to place and person. Pt agreeable to sit edge of bed and was able to do so with assist for a couple minutes before requesting to lie down. L sling adjusted for improved fit and comfort. MD present at session's end.   Follow Up Recommendations  SNF     Equipment Recommendations       Recommendations for Other Services       Precautions / Restrictions Precautions Precautions: Fall Restrictions Weight Bearing Restrictions: Yes LUE Weight Bearing: Non weight bearing    Mobility  Bed Mobility Overal bed mobility: Needs Assistance Bed Mobility: Supine to Sit     Supine to sit: Mod assist Sit to supine: Max assist   General bed mobility comments: max of 2 to slide up and mod 1 to reposition off LUE  Transfers                    Ambulation/Gait                 Stairs            Wheelchair Mobility    Modified Rankin (Stroke Patients Only)       Balance Overall balance assessment: Needs assistance Sitting-balance support: Single extremity supported;Feet supported Sitting balance-Leahy Scale: Poor Sitting balance - Comments:  (leans right) Postural control: Right lateral lean                          Cognition Arousal/Alertness: Lethargic (Very difficult to remain awake) Behavior During Therapy: Flat affect Overall Cognitive Status: Within Functional Limits for tasks assessed                      Exercises General Exercises - Lower  Extremity Ankle Circles/Pumps: AROM;Both;15 reps;Supine Quad Sets: Strengthening;Both;15 reps;Supine Short Arc Quad: AROM;Both;10 reps;Supine Heel Slides: AAROM;Both;15 reps;Supine Hip ABduction/ADduction: Both;15 reps;Supine;PROM (occasional active assisted)    General Comments        Pertinent Vitals/Pain Pain Assessment: No/denies pain    Home Living                      Prior Function            PT Goals (current goals can now be found in the care plan section)      Frequency  Min 2X/week    PT Plan Current plan remains appropriate    Co-evaluation             End of Session   Activity Tolerance: Patient limited by lethargy Patient left: in bed;with bed alarm set;with family/visitor present (MD in room)     Time: 9485-4627 PT Time Calculation (min) (ACUTE ONLY): 25 min  Charges:  $Therapeutic Exercise: 8-22 mins $Therapeutic Activity: 8-22 mins                    G Codes:      Charlaine Dalton 09/02/2014, 3:03 PM

## 2014-09-02 NOTE — Progress Notes (Signed)
Per MD patient will possible be ready for D/C over the weekend. MD will complete D/C Summary Friday in order to send to [facility]. Patient's blue medicare authorization has expiried. CSW will start another blue medicare authorization tomorrow. Plan is for patient to D/C to [facility] over the weekend. Kim admissions coordinator at [facility] is aware of above. CSW will continue to follow and assist as needed.   Blima Rich, [place] [phone]

## 2014-09-02 NOTE — Progress Notes (Signed)
Brenda Roman had more restful night than previous night. Insomnia and pain controlled with medications. Hypertonic fluids administered and completed. Sodium draws from PICC Q2 hour x12 continue. Family at bedside through the night, reassurance and support offered to family. Nursing continues to monitor and assist with ADLs.

## 2014-09-02 NOTE — Progress Notes (Addendum)
[facility] [place] NAME: Brenda Roman    MR#:  [id number]  DATE OF BIRTH:  [birth year]  SUBJECTIVE:  CHIEF COMPLAINT:   Chief Complaint  Patient presents with  . Shortness of Breath    dx with pneumonia yesterday - in rehab for left humerus fx   Pt. More lethargic today and PO intake remains very poor. Family at bedside.  No shortness of breath, fever.  Complaining of a headache  REVIEW OF SYSTEMS:    Review of Systems  Constitutional: Negative for fever and chills.  HENT: Negative for congestion and tinnitus.   Eyes: Negative for blurred vision and double vision.  Respiratory: Negative for cough, shortness of breath and wheezing.   Cardiovascular: Negative for chest pain, orthopnea and PND.  Gastrointestinal: Negative for nausea, vomiting, abdominal pain and diarrhea.  Genitourinary: Negative for dysuria and hematuria.  Neurological: Positive for weakness (improved.). Negative for dizziness, sensory change and focal weakness.  All other systems reviewed and are negative.   Nutrition: Regular diet Tolerating Diet: Poor PO intake but improving.  Tolerating PT: Yes     DRUG ALLERGIES:   Allergies  Allergen Reactions  . Cephalexin Other (See Comments)    Reaction:  Unknown  . Novocain [Procaine] Rash  . Sulfa Antibiotics Rash    VITALS:  Blood pressure 134/57, pulse 78, temperature 98.2 F (36.8 C), temperature source Axillary, resp. rate 19, height 5\' 5"  (1.651 m), weight 65.227 kg (143 lb 12.8 oz), SpO2 98 %.  PHYSICAL EXAMINATION:   Physical Exam  GENERAL:  81 y.o.-year-old patient lying in the bed lethargic but in NAD EYES: Pupils equal, round, reactive to light and accommodation. No scleral icterus. Extraocular muscles intact.  HEENT: Head atraumatic, normocephalic. Oropharynx and nasopharynx clear. Dry oral mucosa  NECK:  Supple, no jugular venous distention. No thyroid enlargement, no tenderness.   LUNGS: Normal breath sounds bilaterally. No rales, rhonchi, wheezing. (-) use of accessory muscles. CARDIOVASCULAR: S1, S2 normal. II/VI SEM at RSB, No rubs, or gallops.  ABDOMEN: Soft, nontender, nondistended. Bowel sounds present. No organomegaly or mass.  EXTREMITIES: No cyanosis, clubbing or edema b/l.   Left shoulder in sling due to recent fracture NEUROLOGIC: Cranial nerves II through XII are intact. No focal Motor or sensory deficits b/l.  Globally weak.  PSYCHIATRIC: The patient is alert and oriented x 2.  Flat affect.   SKIN: No obvious rash, lesion, or ulcer.    LABORATORY PANEL:   CBC  Recent Labs Lab 08/30/14 0430  WBC 8.9  HGB 9.6*  HCT 30.1*  PLT 298   ------------------------------------------------------------------------------------------------------------------  Chemistries   Recent Labs Lab 08/27/14 2000  08/29/14 1605  09/02/14 0620 09/02/14 0905  NA 124*  < > 125*  < > 125* 126*  K 2.5*  < > 5.0  < > 3.8  --   CL 88*  < > 90*  < > 92*  --   CO2 26  < > 27  < > 28  --   GLUCOSE 93  < > 130*  < > 91  --   BUN 12  < > 9  < > 14  --   CREATININE 0.65  < > 0.58  < > 0.62  --   CALCIUM 7.8*  < > 8.0*  < > 8.6*  --   MG 1.9  --   --   --   --   --   AST 20  --  18  --   --   --   ALT 14  --  12*  --   --   --   ALKPHOS 51  --  59  --   --   --   BILITOT 0.8  --  0.5  --   --   --   < > = values in this interval not displayed. ------------------------------------------------------------------------------------------------------------------  Cardiac Enzymes No results for input(s): TROPONINI in the last 168 hours. ------------------------------------------------------------------------------------------------------------------  RADIOLOGY:  Dg Chest Port 1 View  09/01/2014   CLINICAL DATA:  PICC line placement.  EXAM: PORTABLE CHEST - 1 VIEW  COMPARISON:  Aug 29, 2014.  FINDINGS: Stable cardiomegaly and central pulmonary vascular congestion. No  pneumothorax is noted. Mild left basilar opacity is noted concerning for edema or atelectasis with associated effusion. Status post coronary artery bypass graft. Interval placement of right-sided PICC line with distal tip overlying expected position of SVC.  IMPRESSION: Interval placement of right-sided PICC line with distal tip overlying expected position of SVC.   Electronically Signed   By: Marijo Conception, M.D.   On: 09/01/2014 18:26     ASSESSMENT AND PLAN:   81 yo female w/ hx of Primary Billiary Cirrhosis s/p Liver Transplant, hx of recent TIA, chronic a. Fib who presented to the hospital w/ shortness of breath.   * Pneumonia - suspected cause of pt's shortness of breath. Although pt's CXR (-) for pneumonia.  - will cont. Levaquin X 7 days and cultures so far (-).  - clinically stable.   * Hyponatremia - multifactorial. ?? SIADH, tea/toast diet, adrenal insufficency.     - Urine sodium > 10. Urine Osm > 100 and serum Osm 259. - appreciate Nephro input and started on 3% saline and sodium has improved.   - will cont to follow sodium.     * AMS/Encephalopathy - etiology unclear but likely metabolic encephalopathy from hyponatremia.  - mental status a bit improved as sodium has improved.  - pt. 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  Unlikely this is source of her AMS.  Will hold Xarelto for now.   - cont. To follow mental status.   * hx of Liver Transplant - cont. Cellcept, Prednisone.   * hx of Chronic a. Fib - rate controlled.  - cont. Toprol. hold Xarelto given MRI findings as mentioned above.   * Adult Failure to Thrive - cont. Ensure supplements.  - cont. Megace.   Palliative Care following and pt. Remains DNR   Discussed plan of care w/ pt. And family at bedside and also Dr.  Candiss Norse (nephrology)   All the records are reviewed and case discussed with Care Management/Social Workerr. Management plans discussed with the patient, family and they are in agreement.  CODE STATUS: DNR  DVT Prophylaxis: TED's/SCD's.   TOTAL TIME TAKING CARE OF THIS PATIENT: 15minutes.   Possible d/c to SNF in next 1-2 days depending on progress.    Henreitta Leber M.D on 09/02/2014 at 2:54 PM  Between 7am to 6pm - Pager - [phone]  After 6pm go to [email or web address] - password [email or web address] [facility]  Office  [phone]  CC: Primary care physician; Kirk Ruths., MD

## 2014-09-03 LAB — SODIUM
SODIUM: 126 mmol/L — AB (ref 135–145)
Sodium: 125 mmol/L — ABNORMAL LOW (ref 135–145)
Sodium: 129 mmol/L — ABNORMAL LOW (ref 135–145)
Sodium: 129 mmol/L — ABNORMAL LOW (ref 135–145)
Sodium: 128 mmol/L — ABNORMAL LOW (ref 135–145)
Sodium: 126 mmol/L — ABNORMAL LOW (ref 135–145)
Sodium: 128 mmol/L — ABNORMAL LOW (ref 135–145)

## 2014-09-03 LAB — CULTURE, BLOOD (ROUTINE X 2)
CULTURE: NO GROWTH
Culture: NO GROWTH

## 2014-09-03 MED ORDER — SODIUM CHLORIDE 1 G PO TABS: 1.0000 g | ORAL_TABLET | Freq: Every day | ORAL | Status: DC

## 2014-09-03 MED ORDER — MIRTAZAPINE 15 MG PO TABS: 15.0000 mg | ORAL_TABLET | Freq: Every day | ORAL | Status: DC

## 2014-09-03 MED ORDER — LEVOFLOXACIN 500 MG PO TABS: 500.0000 mg | ORAL_TABLET | ORAL | Status: AC

## 2014-09-03 MED ORDER — MEGESTROL ACETATE 40 MG/ML PO SUSP: 400.0000 mg | Freq: Two times a day (BID) | ORAL | Status: DC

## 2014-09-03 MED ORDER — MIRTAZAPINE 15 MG PO TABS
15.0000 mg | ORAL_TABLET | Freq: Every day | ORAL | Status: DC
  Administered 2014-09-03: 15 mg via ORAL
  Filled 2014-09-03: qty 1

## 2014-09-03 NOTE — Discharge Summary (Addendum)
[facility] [place] NAME: Brenda Roman    MR#:  [id number]  DATE OF BIRTH:  [birth year]  DATE OF ADMISSION:  08/29/2014 ADMITTING PHYSICIAN: Demetrios Loll, MD  DATE OF DISCHARGE: 09/04/2014  PRIMARY CARE PHYSICIAN: Kirk Ruths., MD    ADMISSION DIAGNOSIS:  Hypoxia [R09.02] Altered mental status, unspecified altered mental status type [R41.82]  DISCHARGE DIAGNOSIS:  Active Problems:   Acute encephalopathy   Hypoxia   Healthcare-associated pneumonia   Pneumonia   Goals of care, counseling/discussion   SECONDARY DIAGNOSIS:   Past Medical History  Diagnosis Date  . Liver transplanted   . Macular degeneration   . Coronary artery disease   . Hypertension     HOSPITAL COURSE:   81 yo female w/ hx of Primary Billiary Cirrhosis s/p Liver Transplant, hx of recent TIA, chronic a. Fib who presented to the hospital w/ shortness of breath.   * Pneumonia - this was thought to be clinical diagnosis on admission on the patient's chest x-ray was negative she is afebrile and hemodynamic stable.  She will finish Levaquin therapy for pneumonia.  The blood cultures are negative.  * Hyponatremia - multifactorial. ?? SIADH, tea/toast diet, adrenal insufficency.  - Urine sodium > 10. Urine Osm > 100 and serum Osm 259. - Patient was seen by nephrology and started on 3% saline and her sodium has improved on it once her sodium gets up to 132 which it is today she is being discharged to SNF on salt supplements.  Patient should likely have her metabolic profile checked in 1-2 days after discharge at the skilled nursing facility to follow up her sodium.  - discussed plan with Dr. Candiss Norse (Nephrology) and he is in agreement.   * AMS/Encephalopathy - etiology unclear but likely metabolic encephalopathy from hyponatremia.  - mental status slowly improving as sodium has improved.  - pt. 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 Unlikely this is source of her AMS. Cont. To hold Xarelto indefinitely.   - Continue to follow mental status  * hx of Liver Transplant - cont. Cellcept, Prednisone.   * hx of Chronic a. Fib - rate controlled.  - cont. Toprol. Patient was on Xarelto prior to coming in but given her debilitated state and a high fall risk she has been taken off long-term anticoagulation indefinitely.  * Adult Failure to Thrive - patient will continue Megace and ensure supplements and Remeron.  Patient should be followed by a palliative care nurse practitioner at the skilled nursing facility.     DISCHARGE CONDITIONS:   Stable   CONSULTS OBTAINED:  Treatment Team:  Murlean Iba, MD  DRUG ALLERGIES:   Allergies  Allergen Reactions  . Cephalexin Other (See Comments)    Reaction:  Unknown  . Novocain [Procaine] Rash  . Sulfa Antibiotics Rash    DISCHARGE MEDICATIONS:   Current Discharge Medication List    START taking these medications   Details  levofloxacin (LEVAQUIN) 500 MG tablet Take 1 tablet (500 mg total) by mouth every other day. Qty: 2 tablet, Refills: 0    megestrol (MEGACE) 40 MG/ML suspension Take 10 mLs (400 mg total) by mouth 2 (two) times daily. Qty: 240 mL, Refills: 0    mirtazapine (REMERON) 15 MG tablet Take 1 tablet (15 mg total)  by mouth at bedtime. Qty: 30 tablet, Refills: 1    sodium chloride 1 G tablet Take 1 tablet (1 g total) by mouth daily. Qty: 30 tablet, Refills: 0      CONTINUE these medications which have NOT CHANGED   Details  aspirin EC 81 MG tablet Take 81 mg by mouth 2 (two) times daily.     beta carotene w/minerals (OCUVITE) tablet Take 1 tablet by mouth 2 (two) times daily.    Cholecalciferol (VITAMIN D3) 1000 UNITS CAPS Take 2 capsules by mouth daily.    metoprolol  succinate (TOPROL-XL) 50 MG 24 hr tablet Take 50-100 mg by mouth 2 (two) times daily. Pt takes 2 tabs (100mg ) every morning, and 1 tab (50mg ) every evening    mycophenolate (CELLCEPT) 500 MG tablet Take 500 mg by mouth every 12 (twelve) hours.    CALCIUM CARBONATE PO Take 1 tablet by mouth daily.    ezetimibe-simvastatin (VYTORIN) 10-20 MG per tablet Take 1 tablet by mouth at bedtime.    nitroGLYCERIN (NITROSTAT) 0.4 MG SL tablet Place 0.4 mg under the tongue every 5 (five) minutes as needed for chest pain.    ondansetron (ZOFRAN) 4 MG tablet Take 4 mg by mouth every 8 (eight) hours as needed for nausea or vomiting.    predniSONE (DELTASONE) 5 MG tablet Take 5 mg by mouth daily.      STOP taking these medications     rivaroxaban (XARELTO) 20 MG TABS tablet      zolpidem (AMBIEN) 5 MG tablet          DISCHARGE INSTRUCTIONS:   DIET:  Regular diet  DISCHARGE CONDITION:  Good  ACTIVITY:  Activity as tolerated  OXYGEN:  Home Oxygen: No.   Oxygen Delivery: room air  DISCHARGE LOCATION:  nursing home   Please have the Palliative care nurse practitioner follow pt. At the SNF.   If you experience worsening of your admission symptoms, develop shortness of breath, life threatening emergency, suicidal or homicidal thoughts you must seek medical attention immediately by calling 911 or calling your MD immediately  if symptoms less severe.  You Must read complete instructions/literature along with all the possible adverse reactions/side effects for all the Medicines you take and that have been prescribed to you. Take any new Medicines after you have completely understood and accpet all the possible adverse reactions/side effects.   Please note  You were cared for by a hospitalist during your hospital stay. If you have any questions about your discharge medications or the care you received while you were in the hospital after you are discharged, you can call the unit and asked to  speak with the hospitalist on call if the hospitalist that took care of you is not available. Once you are discharged, your primary care physician will handle any further medical issues. Please note that NO REFILLS for any discharge medications will be authorized once you are discharged, as it is imperative that you return to your primary care physician (or establish a relationship with a primary care physician if you do not have one) for your aftercare needs so that they can reassess your need for medications and monitor your lab values.     Today    VITAL SIGNS:  Blood pressure 149/59, pulse 76, temperature 98.3 F (36.8 C), temperature source Axillary, resp. rate 18, height 5\' 5"  (1.651 m), weight 65.227 kg (143 lb 12.8 oz), SpO2 94 %.  I/O:    Intake/Output Summary (Last 24 hours)  at 09/03/14 1415 Last data filed at 09/03/14 0800  Gross per 24 hour  Intake      0 ml  Output      0 ml  Net      0 ml    PHYSICAL EXAMINATION:  GENERAL:  81 y.o.-year-old patient lying in the bed with no acute distress.  EYES: Pupils equal, round, reactive to light and accommodation. No scleral icterus. Extraocular muscles intact.  HEENT: Head atraumatic, normocephalic. Oropharynx and nasopharynx clear.  NECK:  Supple, no jugular venous distention. No thyroid enlargement, no tenderness.  LUNGS: Normal breath sounds bilaterally, no wheezing, rales,rhonchi. No use of accessory muscles of respiration.  CARDIOVASCULAR: S1, S2 normal. II/VI SEM at RSB, no rubs, or gallops.  ABDOMEN: Soft, non-tender, non-distended. Bowel sounds present. No organomegaly or mass.  EXTREMITIES: No pedal edema, cyanosis, or clubbing.  Left shoulder in sling from recent fracture.  NEUROLOGIC: Cranial nerves II through XII are intact. No focal motor or sensory defecits b/l. Globally weak PSYCHIATRIC: The patient is alert and oriented x 2. Flat affect.  SKIN: No obvious rash, lesion, or ulcer.   DATA REVIEW:   CBC  Recent  Labs Lab 08/30/14 0430  WBC 8.9  HGB 9.6*  HCT 30.1*  PLT 298    Chemistries   Recent Labs Lab 08/27/14 2000  08/29/14 1605  09/02/14 0620  09/03/14 1045  NA 124*  < > 125*  < > 125*  < > 128*  K 2.5*  < > 5.0  < > 3.8  --   --   CL 88*  < > 90*  < > 92*  --   --   CO2 26  < > 27  < > 28  --   --   GLUCOSE 93  < > 130*  < > 91  --   --   BUN 12  < > 9  < > 14  --   --   CREATININE 0.65  < > 0.58  < > 0.62  --   --   CALCIUM 7.8*  < > 8.0*  < > 8.6*  --   --   MG 1.9  --   --   --   --   --   --   AST 20  --  18  --   --   --   --   ALT 14  --  12*  --   --   --   --   ALKPHOS 51  --  59  --   --   --   --   BILITOT 0.8  --  0.5  --   --   --   --   < > = values in this interval not displayed.  Cardiac Enzymes No results for input(s): TROPONINI in the last 168 hours.  Microbiology Results  Results for orders placed or performed during the hospital encounter of 08/29/14  Culture, blood (routine x 2)     Status: None   Collection Time: 08/29/14  4:00 PM  Result Value Ref Range Status   Specimen Description BLOOD  Final   Special Requests BLOOD  Final   Culture NO GROWTH 5 DAYS  Final   Report Status 09/03/2014 FINAL  Final  Culture, blood (routine x 2)     Status: None   Collection Time: 08/29/14  4:05 PM  Result Value Ref Range Status   Specimen Description BLOOD  Final  Special Requests BLOOD  Final   Culture NO GROWTH 5 DAYS  Final   Report Status 09/03/2014 FINAL  Final    RADIOLOGY:  Dg Chest Port 1 View  09/01/2014   CLINICAL DATA:  PICC line placement.  EXAM: PORTABLE CHEST - 1 VIEW  COMPARISON:  Aug 29, 2014.  FINDINGS: Stable cardiomegaly and central pulmonary vascular congestion. No pneumothorax is noted. Mild left basilar opacity is noted concerning for edema or atelectasis with associated effusion. Status post coronary artery bypass graft. Interval placement of right-sided PICC line with distal tip overlying expected position of SVC.  IMPRESSION:  Interval placement of right-sided PICC line with distal tip overlying expected position of SVC.   Electronically Signed   By: Marijo Conception, M.D.   On: 09/01/2014 18:26      Management plans discussed with the patient, family and they are in agreement.  CODE STATUS:     Code Status Orders        Start     Ordered   08/29/14 2102  Do not attempt resuscitation (DNR)   Continuous    Question Answer Comment  In the event of cardiac or respiratory ARREST Do not call a "code blue"   In the event of cardiac or respiratory ARREST Do not perform Intubation, CPR, defibrillation or ACLS   In the event of cardiac or respiratory ARREST Use medication by any route, position, wound care, and other measures to relive pain and suffering. May use oxygen, suction and manual treatment of airway obstruction as needed for comfort.      08/29/14 2102    Advance Directive Documentation        Most Recent Value   Type of Advance Directive  Living will   Pre-existing out of facility DNR order (yellow form or pink MOST form)     "MOST" Form in Place?        TOTAL TIME TAKING CARE OF THIS PATIENT: 40 minutes.    Henreitta Leber M.D on 09/03/2014 at 2:15 PM  Between 7am to 6pm - Pager - [phone]  After 6pm go to [email or web address] - password [email or web address] [facility]  Office  [phone]  CC: Primary care physician; Kirk Ruths., MD

## 2014-09-03 NOTE — Progress Notes (Signed)
Plan is for patient to D/C to [facility] over the weekend. Per Kim admissions coordinator at [facility] patient is going to a private room 206. RN will call report at [phone]. Clinical Education officer, museum (CSW) sent D/C Summary to [employer] today via [employer]. [facility] Medicare authorization has been received. Auth # [id number] [id number]. CSW met with patient's son Brenda Roman and made him aware of above. CSW will continue to follow and assist as needed.   Blima Rich, [place] [phone]

## 2014-09-03 NOTE — Progress Notes (Signed)
1900-2300 patient free from injury and falls this shift. Family at the bedside. Patient voiding in bedpan. Patient able to help roll. Left arm in sling. Denies any pain.

## 2014-09-03 NOTE — Consult Note (Addendum)
Palliative Medicine Inpatient Consult Follow Up Note   Name: Brenda Roman Date: 09/03/2014 MRN: [id number]  DOB: [birth year]  Referring Physician: Henreitta Leber, MD  Palliative Care consult requested for this 81 y.o. female for goals of medical therapy in patient with PBC s/p liver transplant, CAD s/p CABG, mod-severe AS/mod Brenda, HTN, CVA, a.fib, temporal arteritis  Brenda Ortloff is lying in bed getting bathed. Awake, answers questions. Family at bedside.    REVIEW OF SYSTEMS:  Pain: None Dyspnea:  No Nausea/Vomiting:  No Diarrhea:  No Constipation:   No Depression:   No Anxiety:   No Fatigue:   Yes  CODE STATUS: DNR   PAST MEDICAL HISTORY: Past Medical History  Diagnosis Date  . Liver transplanted   . Macular degeneration   . Coronary artery disease   . Hypertension     PAST SURGICAL HISTORY:  Past Surgical History  Procedure Laterality Date  . Coronary artery bypass graft      Vital Signs: BP 149/59 mmHg  Pulse 76  Temp(Src) 98.3 F (36.8 C) (Axillary)  Resp 18  Ht 5\' 5"  (1.651 m)  Wt 65.227 kg (143 lb 12.8 oz)  BMI 23.93 kg/m2  SpO2 94% Filed Weights   08/30/14 0533  Weight: 65.227 kg (143 lb 12.8 oz)    Estimated body mass index is 23.93 kg/(m^2) as calculated from the following:   Height as of this encounter: 5\' 5"  (1.651 m).   Weight as of this encounter: 65.227 kg (143 lb 12.8 oz).  PHYSICAL EXAM: Generall: frail appearing HEENT: OP clear, hearing impaired Neck: Trachea midline  Cardiovascular: regular rate and rhythm Pulmonary/Chest: Clear ant fields Abdomin: Soft, NTTP, + bowel sounds GU: No SP tenderness Extremities: No edema  Neurological: Grossly nonfocal Skin: no rashes Psychiatric: flat affect  LABS: CBC:    Component Value Date/Time   WBC 8.9 08/30/2014 0430   WBC 6.6 12/03/2013 2039   HGB 9.6* 08/30/2014 0430   HGB 11.1* 12/03/2013 2039   HCT 30.1* 08/30/2014 0430   HCT 35.6 12/03/2013 2039   PLT 298 08/30/2014 0430   PLT  292 12/03/2013 2039   MCV 81.4 08/30/2014 0430   MCV 89 12/03/2013 2039   NEUTROABS 9.4* 08/27/2014 2000   NEUTROABS 3.5 12/27/2012 0554   LYMPHSABS 0.6* 08/27/2014 2000   LYMPHSABS 1.6 12/27/2012 0554   MONOABS 0.8 08/27/2014 2000   MONOABS 0.6 12/27/2012 0554   EOSABS 0.0 08/27/2014 2000   EOSABS 0.1 12/27/2012 0554   BASOSABS 0.0 08/27/2014 2000   BASOSABS 0.0 12/27/2012 0554   Comprehensive Metabolic Panel:    Component Value Date/Time   NA 128* 09/03/2014 1045   NA 137 12/03/2013 2039   K 3.8 09/02/2014 0620   K 4.2 12/03/2013 2039   CL 92* 09/02/2014 0620   CL 101 12/03/2013 2039   CO2 28 09/02/2014 0620   CO2 31 12/03/2013 2039   BUN 14 09/02/2014 0620   BUN 22* 12/03/2013 2039   CREATININE 0.62 09/02/2014 0620   CREATININE 1.16 12/03/2013 2039   GLUCOSE 91 09/02/2014 0620   GLUCOSE 113* 12/03/2013 2039   CALCIUM 8.6* 09/02/2014 0620   CALCIUM 9.8 12/03/2013 2039   AST 18 08/29/2014 1605   AST 24 12/03/2013 2039   ALT 12* 08/29/2014 1605   ALT 32 12/03/2013 2039   ALKPHOS 59 08/29/2014 1605   ALKPHOS 92 12/03/2013 2039   BILITOT 0.5 08/29/2014 1605   PROT 6.1* 08/29/2014 1605   PROT 7.3 12/03/2013 2039  ALBUMIN 3.2* 08/29/2014 1605   ALBUMIN 3.8 12/03/2013 2039    IMPRESSION: Brenda Roman is an 81 yo woman with PMH of PBC s/p liver transplant, CAD s/p CABG, mod-severe AS/mod Brenda, HTN, CVA, a.fib, temporal arteritis. She was hospitalized 5/2-08/24/14 with UTI, d/c'd to home where she fell resulting in a L.humerus fx. She went to SNF for STR and was readmitted from SNF on 5/8 with shortness of breath and altered mental status. She is being tx'd for presumed pneumonia. Hospital course complicated by hyponatremia and intractable headache.  Sodium correcting and headache resolved. Pt appears depressed. Would start mirtazapine hs for depression and to increase appetite. Discussed with daughter.   Recomment that pt be followed by palliative care NP at  SNF.   PLAN: Mirtazpine 15mg  hs  More than 50% of the visit was spent in counseling/coordination of care: YES  Time spent: 25 minutes

## 2014-09-03 NOTE — Discharge Instructions (Signed)
°  DIET:  Regular diet  DISCHARGE CONDITION:  Stable  ACTIVITY:  Activity as tolerated  OXYGEN:  Home Oxygen: No.   Oxygen Delivery: room air  DISCHARGE LOCATION:  nursing home  Please have the Palliative Care Nurse Practitioner follow pt. At the skilled nursing facility.    If you experience worsening of your admission symptoms, develop shortness of breath, life threatening emergency, suicidal or homicidal thoughts you must seek medical attention immediately by calling 911 or calling your MD immediately  if symptoms less severe.  You Must read complete instructions/literature along with all the possible adverse reactions/side effects for all the Medicines you take and that have been prescribed to you. Take any new Medicines after you have completely understood and accpet all the possible adverse reactions/side effects.   Please note  You were cared for by a hospitalist during your hospital stay. If you have any questions about your discharge medications or the care you received while you were in the hospital after you are discharged, you can call the unit and asked to speak with the hospitalist on call if the hospitalist that took care of you is not available. Once you are discharged, your primary care physician will handle any further medical issues. Please note that NO REFILLS for any discharge medications will be authorized once you are discharged, as it is imperative that you return to your primary care physician (or establish a relationship with a primary care physician if you do not have one) for your aftercare needs so that they can reassess your need for medications and monitor your lab values.

## 2014-09-03 NOTE — Progress Notes (Signed)
Physical Therapy Treatment Patient Details Name: Brenda Roman MRN: [id number] DOB: [birth year] Today's Date: Sep 11, 2014    History of Present Illness 81 yo female with onset of   L humeral fracture and new PNA with weakness and plan to return to inpt therapy when done with this stay.  PMHx: liver transplant, cirrhosis, a-fib    PT Comments    Pt received in bed, asking to get to [facility] earlier today and was able to complete transfer with +2 nursing assist.  At this time pt shows good anti gravity strength but unable to maintain static sitting balance independently and falling asleep throughout session.  Transfer to chair attempted but deferred due to safety concerns with pt's alertnes.  Continuation of PT indicated at this time.  Follow Up Recommendations  SNF     Equipment Recommendations       Recommendations for Other Services       Precautions / Restrictions Precautions Precautions: Fall Restrictions Weight Bearing Restrictions: Yes LUE Weight Bearing: Non weight bearing    Mobility  Bed Mobility Overal bed mobility: Needs Assistance Bed Mobility: Supine to Sit;Sit to Supine     Supine to sit: Mod assist Sit to supine: Max assist   General bed mobility comments: max of 2 to slide up and mod 1 to reposition off LUE  Transfers       Sit to Stand: Max assist (unable/sluming forward/ no initiation)            Ambulation/Gait             General Gait Details: unable   Stairs            Wheelchair Mobility    Modified Rankin (Stroke Patients Only)       Balance                                    Cognition Arousal/Alertness: Lethargic Behavior During Therapy: Flat affect (difficulty keeping eyes open) Overall Cognitive Status: Impaired/Different from baseline Area of Impairment: Attention;Awareness;Problem solving   Current Attention Level: Selective Memory: Decreased short-term memory              Exercises Other  Exercises Other Exercises: ROM to bilateral LE's inclucing AP,QS, GS, and HS.    General Comments        Pertinent Vitals/Pain      Home Living                      Prior Function            PT Goals (current goals can now be found in the care plan section)      Frequency  Min 2X/week    PT Plan Current plan remains appropriate    Co-evaluation             End of Session   Activity Tolerance: Patient limited by fatigue;Patient limited by lethargy Patient left: in bed;with bed alarm set;with family/visitor present     Time: 1138-1208 PT Time Calculation (min) (ACUTE ONLY): 30 min  Charges:  $Therapeutic Exercise: 8-22 mins $Therapeutic Activity: 8-22 mins                    G Codes:      Brenda Roman A Samule Life 2014-09-11, 1:37 PM

## 2014-09-03 NOTE — Progress Notes (Signed)
Subjective:  Feels well today Able to eat some food and take ensure. Na was 129. Staying in 128-129 range while on 3% saline/   Objective:  Vital signs in last 24 hours:  Temp:  [98.2 F (36.8 C)-98.5 F (36.9 C)] 98.3 F (36.8 C) (05/13 0905) Pulse Rate:  [76-84] 76 (05/13 0905) Resp:  [18] 18 (05/13 0531) BP: (119-153)/(59-67) 149/59 mmHg (05/13 0905) SpO2:  [94 %-100 %] 94 % (05/13 0905)  Weight change:  Filed Weights   08/30/14 0533  Weight: 65.227 kg (143 lb 12.8 oz)    Intake/Output:     Intake/Output this shift:     Physical Exam: General: NAD,   Head: Normocephalic, atraumatic. dry oral mucosal membranes  Eyes: Anicteric,   Neck: Supple, trachea midline  Lungs:  Clear to auscultation  Heart: Regular rate and rhythm  Abdomen:  Soft, nontender,   Extremities:  no peripheral edema.  Neurologic: Nonfocal, moving all four extremities  Skin: No lesions  Access:     Basic Metabolic Panel:  Recent Labs Lab 08/27/14 2000 08/29/14 0900 08/29/14 1605 08/30/14 0430 09/01/14 5465  09/02/14 0620  09/02/14 1727 09/02/14 2251 09/03/14 0304 09/03/14 0614 09/03/14 1045  NA 124* 128* 125* 125* 123*  < > 125*  < > 126* 125* 129* 129* 128*  K 2.5* 4.0 5.0 4.4 3.5  --  3.8  --   --   --   --   --   --   CL 88* 97* 90* 92* 91*  --  92*  --   --   --   --   --   --   CO2 26 23 27 26 26   --  28  --   --   --   --   --   --   GLUCOSE 93 82 130* 126* 111*  --  91  --   --   --   --   --   --   BUN 12 8 9 7 9   --  14  --   --   --   --   --   --   CREATININE 0.65 0.49 0.58 0.47 0.51  --  0.62  --   --   --   --   --   --   CALCIUM 7.8* 7.1* 8.0* 7.4* 8.1*  --  8.6*  --   --   --   --   --   --   MG 1.9  --   --   --   --   --   --   --   --   --   --   --   --   < > = values in this interval not displayed.  Liver Function Tests:  Recent Labs Lab 08/27/14 2000 08/29/14 1605  AST 20 18  ALT 14 12*  ALKPHOS 51 59  BILITOT 0.8 0.5  PROT 5.9* 6.1*  ALBUMIN  3.4* 3.2*   No results for input(s): LIPASE, AMYLASE in the last 168 hours. No results for input(s): AMMONIA in the last 168 hours.  CBC:  Recent Labs Lab 08/27/14 2000 08/29/14 1605 08/30/14 0430  WBC 10.9 11.7* 8.9  NEUTROABS 9.4*  --   --   HGB 9.7* 10.1* 9.6*  HCT 29.5* 31.9* 30.1*  MCV 81.0 81.0 81.4  PLT 266 351 298    Cardiac Enzymes: No results for input(s): CKTOTAL, CKMB, CKMBINDEX, TROPONINI in the last 168 hours.  BNP: Invalid input(s): POCBNP  CBG:  Recent Labs Lab 09/01/14 2030 09/02/14 0732  GLUCAP 105* 3    Microbiology: Results for orders placed or performed during the hospital encounter of 08/29/14  Culture, blood (routine x 2)     Status: None   Collection Time: 08/29/14  4:00 PM  Result Value Ref Range Status   Specimen Description BLOOD  Final   Special Requests BLOOD  Final   Culture NO GROWTH 5 DAYS  Final   Report Status 09/03/2014 FINAL  Final  Culture, blood (routine x 2)     Status: None   Collection Time: 08/29/14  4:05 PM  Result Value Ref Range Status   Specimen Description BLOOD  Final   Special Requests BLOOD  Final   Culture NO GROWTH 5 DAYS  Final   Report Status 09/03/2014 FINAL  Final    Coagulation Studies: No results for input(s): LABPROT, INR in the last 72 hours.  Urinalysis: No results for input(s): COLORURINE, LABSPEC, PHURINE, GLUCOSEU, HGBUR, BILIRUBINUR, KETONESUR, PROTEINUR, UROBILINOGEN, NITRITE, LEUKOCYTESUR in the last 72 hours.  Invalid input(s): APPERANCEUR    Imaging: Dg Chest Port 1 View  09/01/2014   CLINICAL DATA:  PICC line placement.  EXAM: PORTABLE CHEST - 1 VIEW  COMPARISON:  Aug 29, 2014.  FINDINGS: Stable cardiomegaly and central pulmonary vascular congestion. No pneumothorax is noted. Mild left basilar opacity is noted concerning for edema or atelectasis with associated effusion. Status post coronary artery bypass graft. Interval placement of right-sided PICC line with distal tip overlying  expected position of SVC.  IMPRESSION: Interval placement of right-sided PICC line with distal tip overlying expected position of SVC.   Electronically Signed   By: Marijo Conception, M.D.   On: 09/01/2014 18:26     Medications:     . aspirin EC  81 mg Oral BID  . feeding supplement (ENSURE ENLIVE)  237 mL Oral BID BM  . levofloxacin  500 mg Oral Q48H  . megestrol  400 mg Oral BID  . metoprolol succinate  50 mg Oral BID  . mycophenolate  500 mg Oral BID  . predniSONE  5 mg Oral Daily   acetaminophen **OR** acetaminophen, albuterol, bisacodyl, calcium carbonate, nitroGLYCERIN, ondansetron **OR** ondansetron (ZOFRAN) IV, oxyCODONE, senna-docusate, zolpidem  Assessment/ Plan:  81 y.o. female with a PMHx of significant for ischemic heart disease, non-Q-wave MI, atrial fibrillation, temporal arteritis, liver transplant on CellCept and steroids, TIA   1. Hyponatremia - DDx includes SIADH, dehydration, Adrenal insufficiency, tea/toast diet - urine Osm > S Osm - improved some with 3% saline Plan: 3% saline via CVL continued. D/c drip once Na is 132 or higher - monitor Na closely    LOS: 5 Brenda Roman 5/13/20161:11 PM

## 2014-09-03 NOTE — Progress Notes (Addendum)
[facility] at [place] NAME: Brenda Roman    MR#:  [id number]  DATE OF BIRTH:  Feb 28, 1924  SUBJECTIVE:  CHIEF COMPLAINT:   Chief Complaint  Patient presents with  . Shortness of Breath    dx with pneumonia yesterday - in rehab for left humerus fx   Pt. More awake today.  Headache resolved.  Sodium still low but improving.  Family at bedside.   REVIEW OF SYSTEMS:    Review of Systems  Constitutional: Negative for fever and chills.  HENT: Negative for congestion and tinnitus.   Eyes: Negative for blurred vision and double vision.  Respiratory: Negative for cough, shortness of breath and wheezing.   Cardiovascular: Negative for chest pain, orthopnea and PND.  Gastrointestinal: Negative for nausea, vomiting, abdominal pain, diarrhea and blood in stool.  Genitourinary: Negative for dysuria and hematuria.  Neurological: Positive for weakness (generalized). Negative for dizziness, sensory change and focal weakness.  All other systems reviewed and are negative.   Nutrition: Regular diet Tolerating Diet:  PO intake improving.  Tolerating PT: Yes   DRUG ALLERGIES:   Allergies  Allergen Reactions  . Cephalexin Other (See Comments)    Reaction:  Unknown  . Novocain [Procaine] Rash  . Sulfa Antibiotics Rash    VITALS:  Blood pressure 149/59, pulse 76, temperature 98.3 F (36.8 C), temperature source Axillary, resp. rate 18, height 5\' 5"  (1.651 m), weight 65.227 kg (143 lb 12.8 oz), SpO2 94 %.  PHYSICAL EXAMINATION:   Physical Exam  GENERAL:  81 y.o.-year-old patient lying in the bed a bit more awake today.  EYES: Pupils equal, round, reactive to light and accommodation. No scleral icterus. Extraocular muscles intact.  HEENT: Head atraumatic, normocephalic. Oropharynx and nasopharynx clear. Dry oral mucosa  NECK:  Supple, no jugular venous distention. No thyroid enlargement, no tenderness.  LUNGS: Normal breath sounds  bilaterally. No rales, rhonchi, wheezing. (-) use of accessory muscles. CARDIOVASCULAR: S1, S2 normal. II/VI SEM at RSB, No rubs, or gallops.  ABDOMEN: Soft, nontender, nondistended. Bowel sounds present. No organomegaly or mass.  EXTREMITIES: No cyanosis, clubbing or edema b/l.   Left shoulder in sling due to recent fracture NEUROLOGIC: Cranial nerves II through XII are intact. No focal Motor or sensory deficits b/l.  Globally weak.  PSYCHIATRIC: The patient is alert and oriented x 2.  Flat affect.   SKIN: No obvious rash, lesion, or ulcer.    LABORATORY PANEL:   CBC  Recent Labs Lab 08/30/14 0430  WBC 8.9  HGB 9.6*  HCT 30.1*  PLT 298   ------------------------------------------------------------------------------------------------------------------  Chemistries   Recent Labs Lab 08/27/14 2000  08/29/14 1605  09/02/14 0620  09/03/14 1045  NA 124*  < > 125*  < > 125*  < > 128*  K 2.5*  < > 5.0  < > 3.8  --   --   CL 88*  < > 90*  < > 92*  --   --   CO2 26  < > 27  < > 28  --   --   GLUCOSE 93  < > 130*  < > 91  --   --   BUN 12  < > 9  < > 14  --   --   CREATININE 0.65  < > 0.58  < > 0.62  --   --   CALCIUM 7.8*  < > 8.0*  < > 8.6*  --   --  MG 1.9  --   --   --   --   --   --   AST 20  --  18  --   --   --   --   ALT 14  --  12*  --   --   --   --   ALKPHOS 51  --  59  --   --   --   --   BILITOT 0.8  --  0.5  --   --   --   --   < > = values in this interval not displayed. ------------------------------------------------------------------------------------------------------------------  Cardiac Enzymes No results for input(s): TROPONINI in the last 168 hours. ------------------------------------------------------------------------------------------------------------------  RADIOLOGY:  Dg Chest Port 1 View  09/01/2014   CLINICAL DATA:  PICC line placement.  EXAM: PORTABLE CHEST - 1 VIEW  COMPARISON:  Aug 29, 2014.  FINDINGS: Stable cardiomegaly and central  pulmonary vascular congestion. No pneumothorax is noted. Mild left basilar opacity is noted concerning for edema or atelectasis with associated effusion. Status post coronary artery bypass graft. Interval placement of right-sided PICC line with distal tip overlying expected position of SVC.  IMPRESSION: Interval placement of right-sided PICC line with distal tip overlying expected position of SVC.   Electronically Signed   By: Marijo Conception, M.D.   On: 09/01/2014 18:26     ASSESSMENT AND PLAN:   81 yo female w/ hx of Primary Billiary Cirrhosis s/p Liver Transplant, hx of recent TIA, chronic a. Fib who presented to the hospital w/ shortness of breath.   * Pneumonia - suspected cause of pt's shortness of breath. Although pt's CXR (-) for pneumonia.  - will cont. Levaquin X 7 days and cultures so far (-).  - clinically stable.   * Hyponatremia - multifactorial. ?? SIADH, tea/toast diet, adrenal insufficency.     - Urine sodium > 10. Urine Osm > 100 and serum Osm 259. - appreciate Nephro input and started on 3% saline and sodium is improving.  - stop 3% saline once sodium up to 132.     - may need to be discharged on salt supplements.   * AMS/Encephalopathy - etiology unclear but likely metabolic encephalopathy from hyponatremia.  - mental status improving as sodium has improved.  - pt. 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  Unlikely this is source of her AMS.  Will hold Xarelto for now.   - will monitor.    * hx of Liver Transplant - cont. Cellcept, Prednisone.   * hx of Chronic a. Fib - rate controlled.  - cont. Toprol. will d/c Xarelto indefinitely now given high fall risk and debility.    * Adult Failure to Thrive - cont. Ensure supplements.  - cont. Megace. Will add Remeron at bedtime.    Palliative Care following and pt. Remains DNR   Discussed plan of care w/ pt. And family at bedside and also Dr. Candiss Norse (nephrology) and possible d/c over the weekend if sodium improved and clinically doing well.   All the records are reviewed and case discussed with Care Management/Social Workerr. Management plans discussed with the patient, family and they are in agreement.  CODE STATUS: DNR  DVT Prophylaxis: Ted's/SCD's.   TOTAL TIME TAKING CARE OF THIS PATIENT: 35 minutes.   Possible d/c to SNF  in next 1-2 days depending on progress.    Henreitta Leber M.D on 09/03/2014 at 1:53 PM  Between 7am to 6pm - Pager - [phone]  After 6pm go to [email or web address] - password [email or web address] [facility]  Office  [phone]  CC: Primary care physician; Kirk Ruths., MD

## 2014-09-04 LAB — SODIUM, URINE, RANDOM: Sodium, Ur: 59 mmol/L

## 2014-09-04 LAB — SODIUM
SODIUM: 132 mmol/L — AB (ref 135–145)
SODIUM: 129 mmol/L — AB (ref 135–145)
Sodium: 130 mmol/L — ABNORMAL LOW (ref 135–145)

## 2014-09-04 LAB — OSMOLALITY, URINE: OSMOLALITY UR: 212 mosm/kg — AB (ref 300–900)

## 2014-09-04 LAB — CHLORIDE, URINE, RANDOM: CHLORIDE URINE: 57 mmol/L

## 2014-09-04 MED ORDER — ALTEPLASE 2 MG IJ SOLR
2.0000 mg | Freq: Once | INTRAMUSCULAR | Status: AC
  Administered 2014-09-04: 2 mg
  Filled 2014-09-04: qty 2

## 2014-09-04 MED ORDER — SODIUM CHLORIDE 0.9 % IJ SOLN
10.0000 mL | Freq: Two times a day (BID) | INTRAMUSCULAR | Status: DC
  Administered 2014-09-04: 20 mL
  Administered 2014-09-05: 10 mL

## 2014-09-04 MED ORDER — STERILE WATER FOR INJECTION IJ SOLN
INTRAMUSCULAR | Status: AC
  Administered 2014-09-04: 10 mL
  Filled 2014-09-04: qty 10

## 2014-09-04 MED ORDER — SODIUM CHLORIDE 0.9 % IJ SOLN
10.0000 mL | INTRAMUSCULAR | Status: DC | PRN
  Administered 2014-09-04: 10 mL
  Filled 2014-09-04: qty 40

## 2014-09-04 MED ORDER — SODIUM CHLORIDE 1 G PO TABS
1.0000 g | ORAL_TABLET | Freq: Two times a day (BID) | ORAL | Status: DC
  Administered 2014-09-04 – 2014-09-05 (×2): 1 g via ORAL
  Filled 2014-09-04 (×5): qty 1

## 2014-09-04 NOTE — Progress Notes (Signed)
CSW following for d/c to [facility] pending medical clearance. Per MD, no d/c today. [facility] is able to accept pt on Sunday if medically stable. CSW will follow. Wandra Feinstein, MSW, LCSW [phone] (weekend coverage)

## 2014-09-04 NOTE — Progress Notes (Signed)
Dr. Verdell Carmine contacted with sodium of 129. MD ordered discharge to be discontinued, sodium draws q4h d/c. Ordered the picc line to be declotted per protocol, if unable to declot, then d/c picc. Also ordered sodium chloride 1 gram tabs bid and repeat sodium in am.

## 2014-09-04 NOTE — Plan of Care (Signed)
Problem: Phase II Progression Outcomes Goal: Wean O2 if indicated Outcome: Completed/Met Date Met:  09/04/14 94% on room air     

## 2014-09-04 NOTE — Progress Notes (Signed)
CRITICAL VALUE ALERT  Critical value received sodium 129  Date of notification:  09/04/2014  Time of notification:  9311  Critical value read back:Yes.    Nurse who received alert:  Erol Flanagin, rn  MD notified (1st page):  Verdell Carmine  Time of first page:  1447  MD notified (2nd page):  Time of second page:  Responding MD:  Verdell Carmine  Time MD responded:  1447

## 2014-09-04 NOTE — Treatment Plan (Signed)
Unable to flush or draw back from PICC line in right upper arm using 10cc syringe and 10cc saline flush. After several attempts of draw back on PICC line port with 10cc syringe, able to instill 4cc of cath flo. Had patient turn head, cough, repositioned patient and had family assist with positioning arm that had PICC line.

## 2014-09-04 NOTE — Progress Notes (Signed)
Pharmacy consulted to monitor serum sodium in this 81 year old female being treated for hyponatremia.  Per nephrology, will stop hypertonic saline once Na: 132  Patient previously on 3% NaCl at 108ml/hr, order expired 5/13 at 12:30. Not reordered at this time.  Sodium this AM: 132  Pharmacy will sign of at this time, please reconsult if hypertonic saline resumed  Rexene Edison, PharmD 09/04/2014 9:46 AM

## 2014-09-04 NOTE — Progress Notes (Signed)
Subjective:  Had a rough night conversive Na 132 .  3% stopped  Objective:  Vital signs in last 24 hours:  Temp:  [97.7 F (36.5 C)-98.4 F (36.9 C)] 98.1 F (36.7 C) (05/14 0824) Pulse Rate:  [77-81] 77 (05/14 0824) Resp:  [18-20] 18 (05/14 0824) BP: (124-173)/(65-76) 124/65 mmHg (05/14 0824) SpO2:  [93 %-100 %] 94 % (05/14 1002)  Weight change:  Filed Weights   08/30/14 0533  Weight: 65.227 kg (143 lb 12.8 oz)    Intake/Output: I/O last 3 completed shifts: In: -  Out: 400 [Urine:400]   Intake/Output this shift:  Total I/O In: 240 [P.O.:240] Out: -   Physical Exam: General: NAD,   Head: Normocephalic, atraumatic. dry oral mucosal membranes  Eyes: Anicteric,   Neck: Supple, trachea midline  Lungs:  Clear to auscultation  Heart: Regular rate and rhythm  Abdomen:  Soft, nontender,   Extremities:  no peripheral edema.  Neurologic: Nonfocal, moving all four extremities  Skin: No lesions  Access:     Basic Metabolic Panel:  Recent Labs Lab 08/29/14 0900 08/29/14 1605 08/30/14 0430 09/01/14 0621  09/02/14 0620  09/03/14 1440 09/03/14 1548 09/03/14 1743 09/04/14 0230 09/04/14 0447  NA 128* 125* 125* 123*  < > 125*  < > 126* 128* 126* 130* 132*  K 4.0 5.0 4.4 3.5  --  3.8  --   --   --   --   --   --   CL 97* 90* 92* 91*  --  92*  --   --   --   --   --   --   CO2 23 27 26 26   --  28  --   --   --   --   --   --   GLUCOSE 82 130* 126* 111*  --  91  --   --   --   --   --   --   BUN 8 9 7 9   --  14  --   --   --   --   --   --   CREATININE 0.49 0.58 0.47 0.51  --  0.62  --   --   --   --   --   --   CALCIUM 7.1* 8.0* 7.4* 8.1*  --  8.6*  --   --   --   --   --   --   < > = values in this interval not displayed.  Liver Function Tests:  Recent Labs Lab 08/29/14 1605  AST 18  ALT 12*  ALKPHOS 59  BILITOT 0.5  PROT 6.1*  ALBUMIN 3.2*   No results for input(s): LIPASE, AMYLASE in the last 168 hours. No results for input(s): AMMONIA in the last  168 hours.  CBC:  Recent Labs Lab 08/29/14 1605 08/30/14 0430  WBC 11.7* 8.9  HGB 10.1* 9.6*  HCT 31.9* 30.1*  MCV 81.0 81.4  PLT 351 298    Cardiac Enzymes: No results for input(s): CKTOTAL, CKMB, CKMBINDEX, TROPONINI in the last 168 hours.  BNP: Invalid input(s): POCBNP  CBG:  Recent Labs Lab 09/01/14 2030 09/02/14 0732  GLUCAP 105* 36    Microbiology: Results for orders placed or performed during the hospital encounter of 08/29/14  Culture, blood (routine x 2)     Status: None   Collection Time: 08/29/14  4:00 PM  Result Value Ref Range Status   Specimen Description BLOOD  Final  Special Requests BLOOD  Final   Culture NO GROWTH 5 DAYS  Final   Report Status 09/03/2014 FINAL  Final  Culture, blood (routine x 2)     Status: None   Collection Time: 08/29/14  4:05 PM  Result Value Ref Range Status   Specimen Description BLOOD  Final   Special Requests BLOOD  Final   Culture NO GROWTH 5 DAYS  Final   Report Status 09/03/2014 FINAL  Final    Coagulation Studies: No results for input(s): LABPROT, INR in the last 72 hours.  Urinalysis: No results for input(s): COLORURINE, LABSPEC, PHURINE, GLUCOSEU, HGBUR, BILIRUBINUR, KETONESUR, PROTEINUR, UROBILINOGEN, NITRITE, LEUKOCYTESUR in the last 72 hours.  Invalid input(s): APPERANCEUR    Imaging: No results found.   Medications:     . aspirin EC  81 mg Oral BID  . feeding supplement (ENSURE ENLIVE)  237 mL Oral BID BM  . megestrol  400 mg Oral BID  . metoprolol succinate  50 mg Oral BID  . mirtazapine  15 mg Oral QHS  . mycophenolate  500 mg Oral BID  . predniSONE  5 mg Oral Daily   acetaminophen **OR** acetaminophen, albuterol, bisacodyl, calcium carbonate, nitroGLYCERIN, ondansetron **OR** ondansetron (ZOFRAN) IV, oxyCODONE, senna-docusate, zolpidem  Assessment/ Plan:  81 y.o. female with a PMHx of significant for ischemic heart disease, non-Q-wave MI, atrial fibrillation, temporal arteritis, liver  transplant on CellCept and steroids, TIA   1. Hyponatremia - DDx includes SIADH, dehydration, Adrenal insufficiency, tea/toast diet - urine Osm > S Osm - improved some with 3% saline Plan: - salt tabs - monitor Na at rehab   LOS: 6 Lillie Bollig 5/14/201610:34 AM

## 2014-09-04 NOTE — Treatment Plan (Signed)
Assessed PICC line, unable to draw back, but able to flush a small amount of fluid. Prepared to instill second dose of cath flow. When drawing back to create the pressure for the stop cock method of instillation, blood return into syringe noted. Cap changed, PICC line flushed with saline. Flushed easily.

## 2014-09-04 NOTE — Progress Notes (Signed)
Pt restless, complained of pain x 2, medicated twice. Family was concerned pt slept with eyes open. Pt turned and repositioned frequently for stated discomfort. Wearing sling on left arm. Complained of constipation, had suppository with positive results.

## 2014-09-04 NOTE — Progress Notes (Signed)
[facility] at [place] NAME: Brenda Roman    MR#:  [id number]  DATE OF BIRTH:  1923-07-10  SUBJECTIVE:  CHIEF COMPLAINT:   Chief Complaint  Patient presents with  . Shortness of Breath    dx with pneumonia yesterday - in rehab for left humerus fx   Sodium was 132 earlier this a.m. But dropped again to 129 this afternoon.  Discharge help.  Pt. Still confused at times.  PO intake improving.  Family at bedside.   REVIEW OF SYSTEMS:    Review of Systems  Constitutional: Negative for fever and chills.  HENT: Negative for congestion and tinnitus.   Eyes: Negative for blurred vision and double vision.  Respiratory: Negative for cough, shortness of breath and wheezing.   Cardiovascular: Negative for chest pain, orthopnea and PND.  Gastrointestinal: Negative for nausea, vomiting, abdominal pain and diarrhea.  Genitourinary: Negative for dysuria and hematuria.  Neurological: Positive for weakness (generalized). Negative for dizziness, sensory change and focal weakness.  All other systems reviewed and are negative.   Nutrition: Regular diet Tolerating Diet:  PO intake improving.  Tolerating PT: Yes   DRUG ALLERGIES:   Allergies  Allergen Reactions  . Cephalexin Other (See Comments)    Reaction:  Unknown  . Novocain [Procaine] Rash  . Sulfa Antibiotics Rash    VITALS:  Blood pressure 111/53, pulse 84, temperature 98.7 F (37.1 C), temperature source Oral, resp. rate 18, height 5\' 5"  (1.651 m), weight 65.227 kg (143 lb 12.8 oz), SpO2 93 %.  PHYSICAL EXAMINATION:   Physical Exam  GENERAL:  81 y.o.-year-old patient lying in the bed in NAD EYES: Pupils equal, round, reactive to light and accommodation. No scleral icterus. Extraocular muscles intact.  HEENT: Head atraumatic, normocephalic. Oropharynx and nasopharynx clear. Dry oral mucosa  NECK:  Supple, no jugular venous distention. No thyroid enlargement, no tenderness.   LUNGS: Normal breath sounds bilaterally. No rales, rhonchi, wheezing. (-) use of accessory muscles. CARDIOVASCULAR: S1, S2 normal. II/VI SEM at RSB, No rubs, or gallops.  ABDOMEN: Soft, nontender, nondistended. Bowel sounds present. No organomegaly or mass.  EXTREMITIES: No cyanosis, clubbing or edema b/l.   Left shoulder in sling due to recent fracture NEUROLOGIC: Cranial nerves II through XII are intact. No focal Motor or sensory deficits b/l.  Globally weak.  PSYCHIATRIC: The patient is alert and oriented x 2.  Flat affect.   SKIN: No obvious rash, lesion, or ulcer.    LABORATORY PANEL:   CBC  Recent Labs Lab 08/30/14 0430  WBC 8.9  HGB 9.6*  HCT 30.1*  PLT 298   ------------------------------------------------------------------------------------------------------------------  Chemistries   Recent Labs Lab 08/29/14 1605  09/02/14 0620  09/04/14 1243  NA 125*  < > 125*  < > 129*  K 5.0  < > 3.8  --   --   CL 90*  < > 92*  --   --   CO2 27  < > 28  --   --   GLUCOSE 130*  < > 91  --   --   BUN 9  < > 14  --   --   CREATININE 0.58  < > 0.62  --   --   CALCIUM 8.0*  < > 8.6*  --   --   AST 18  --   --   --   --   ALT 12*  --   --   --   --  ALKPHOS 59  --   --   --   --   BILITOT 0.5  --   --   --   --   < > = values in this interval not displayed. ------------------------------------------------------------------------------------------------------------------  Cardiac Enzymes No results for input(s): TROPONINI in the last 168 hours. ------------------------------------------------------------------------------------------------------------------  RADIOLOGY:  No results found.   ASSESSMENT AND PLAN:   81 yo female w/ hx of Primary Billiary Cirrhosis s/p Liver Transplant, hx of recent TIA, chronic a. Fib who presented to the hospital w/ shortness of breath.   * Pneumonia - suspected cause of pt's shortness of breath on admission. Although pt's CXR (-) for  pneumonia.  - cont. Levaquin and clinically stable.  Cultures (-).     * Hyponatremia - multifactorial. ?? SIADH, tea/toast diet, adrenal insufficency.     - Urine sodium > 10. Urine Osm > 100 and serum Osm 259. - appreciate Nephro input and was on 3% saline and sodium improved to 132 and 3% saline stopped. Sodium dropped again to 129 this afternoon.  - will start on salt tabs and discussed w/ Dr. Candiss Norse.  - follow up sodium in a.m. Tomorrow.   * AMS/Encephalopathy - etiology unclear but likely metabolic encephalopathy from hyponatremia.  - mental status slowly improving.  ?? Underlying dementia.  - pt. 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  Unlikely this is source of her AMS.  Cont. To hold Xarelto  * hx of Liver Transplant - cont. Cellcept, Prednisone.   * hx of Chronic a. Fib - rate controlled.  - cont. Toprol. will d/c Xarelto indefinitely now given high fall risk and debility.    * Adult Failure to Thrive - cont. Ensure supplements.  - cont. Megace,  Remeron at bedtime.    Palliative Care following and pt. Remains DNR   D/c to SNF when sodium stable and clinically doing better. Plan of care discussed w/ family and also Dr. Candiss Norse (nephrolog).    All the records are reviewed and case discussed with Care Management/Social Workerr. Management plans discussed with the patient, family and they are in agreement.  CODE STATUS: DNR  DVT Prophylaxis: TED's/SCD's.    TOTAL TIME TAKING CARE OF THIS PATIENT: 30 minutes.   Possible d/c to SNF in next 1-2 days depending on progress.    Henreitta Leber M.D on 09/04/2014 at 6:31 PM  Between 7am to 6pm - Pager - [phone]  After 6pm go to [email or web address] - password [email or web address] [facility]  Office   [phone]  CC: Primary care physician; Kirk Ruths., MD

## 2014-09-05 LAB — SODIUM: SODIUM: 133 mmol/L — AB (ref 135–145)

## 2014-09-05 NOTE — Progress Notes (Signed)
Subjective:  Na 133 today.  Sitting up in chair   Objective:  Vital signs in last 24 hours:  Temp:  [98.3 F (36.8 C)-98.8 F (37.1 C)] 98.8 F (37.1 C) (05/15 0440) Pulse Rate:  [75-85] 75 (05/15 0440) Resp:  [16-18] 16 (05/15 0440) BP: (93-123)/(49-66) 123/66 mmHg (05/15 0440) SpO2:  [92 %-96 %] 96 % (05/15 0440)  Weight change:  Filed Weights   08/30/14 0533  Weight: 65.227 kg (143 lb 12.8 oz)    Intake/Output: I/O last 3 completed shifts: In: 480 [P.O.:480] Out: 400 [Urine:400]   Intake/Output this shift:  Total I/O In: 250 [P.O.:240; I.V.:10] Out: -   Physical Exam: General: NAD,   Head:  dry oral mucosal membranes  Eyes: Anicteric,   Neck: Supple,    Lungs:  Clear to auscultation, decreased breath sounds  Heart: No rub  Abdomen:  Soft, nontender,   Extremities:  trace peripheral edema.  Neurologic: Nonfocal, able to answer and converse  Skin: No lesions  Access:     Basic Metabolic Panel:  Recent Labs Lab 08/29/14 1605 08/30/14 0430 09/01/14 0621  09/02/14 0620  09/03/14 1743 09/04/14 0230 09/04/14 0447 09/04/14 1243 09/05/14 0435  NA 125* 125* 123*  < > 125*  < > 126* 130* 132* 129* 133*  K 5.0 4.4 3.5  --  3.8  --   --   --   --   --   --   CL 90* 92* 91*  --  92*  --   --   --   --   --   --   CO2 27 26 26   --  28  --   --   --   --   --   --   GLUCOSE 130* 126* 111*  --  91  --   --   --   --   --   --   BUN 9 7 9   --  14  --   --   --   --   --   --   CREATININE 0.58 0.47 0.51  --  0.62  --   --   --   --   --   --   CALCIUM 8.0* 7.4* 8.1*  --  8.6*  --   --   --   --   --   --   < > = values in this interval not displayed.  Liver Function Tests:  Recent Labs Lab 08/29/14 1605  AST 18  ALT 12*  ALKPHOS 59  BILITOT 0.5  PROT 6.1*  ALBUMIN 3.2*   No results for input(s): LIPASE, AMYLASE in the last 168 hours. No results for input(s): AMMONIA in the last 168 hours.  CBC:  Recent Labs Lab 08/29/14 1605 08/30/14 0430   WBC 11.7* 8.9  HGB 10.1* 9.6*  HCT 31.9* 30.1*  MCV 81.0 81.4  PLT 351 298    Cardiac Enzymes: No results for input(s): CKTOTAL, CKMB, CKMBINDEX, TROPONINI in the last 168 hours.  BNP: Invalid input(s): POCBNP  CBG:  Recent Labs Lab 09/01/14 2030 09/02/14 0732  GLUCAP 105* 47    Microbiology: Results for orders placed or performed during the hospital encounter of 08/29/14  Culture, blood (routine x 2)     Status: None   Collection Time: 08/29/14  4:00 PM  Result Value Ref Range Status   Specimen Description BLOOD  Final   Special Requests BLOOD  Final   Culture NO GROWTH  5 DAYS  Final   Report Status 09/03/2014 FINAL  Final  Culture, blood (routine x 2)     Status: None   Collection Time: 08/29/14  4:05 PM  Result Value Ref Range Status   Specimen Description BLOOD  Final   Special Requests BLOOD  Final   Culture NO GROWTH 5 DAYS  Final   Report Status 09/03/2014 FINAL  Final    Coagulation Studies: No results for input(s): LABPROT, INR in the last 72 hours.  Urinalysis: No results for input(s): COLORURINE, LABSPEC, PHURINE, GLUCOSEU, HGBUR, BILIRUBINUR, KETONESUR, PROTEINUR, UROBILINOGEN, NITRITE, LEUKOCYTESUR in the last 72 hours.  Invalid input(s): APPERANCEUR    Imaging: No results found.   Medications:     . aspirin EC  81 mg Oral BID  . feeding supplement (ENSURE ENLIVE)  237 mL Oral BID BM  . megestrol  400 mg Oral BID  . metoprolol succinate  50 mg Oral BID  . mirtazapine  15 mg Oral QHS  . mycophenolate  500 mg Oral BID  . predniSONE  5 mg Oral Daily  . sodium chloride  10-40 mL Intracatheter Q12H  . sodium chloride  1 g Oral BID   acetaminophen **OR** acetaminophen, albuterol, bisacodyl, calcium carbonate, nitroGLYCERIN, ondansetron **OR** ondansetron (ZOFRAN) IV, oxyCODONE, senna-docusate, sodium chloride, zolpidem  Assessment/ Plan:  81 y.o. female with a PMHx of significant for ischemic heart disease, non-Q-wave MI, atrial  fibrillation, temporal arteritis, liver transplant on CellCept and steroids, TIA   1. Hyponatremia - DDx includes SIADH, dehydration, Adrenal insufficiency, tea/toast diet -  improved some with 3% saline, now on Nacl tabs BID Plan: - salt tabs - monitor Na at rehab   LOS: 7 Kohner Orlick 5/15/201611:43 AM

## 2014-09-05 NOTE — Progress Notes (Signed)
Initial Nutrition Assessment  DOCUMENTATION CODES:   INTERVENTION: Medical Nutrition Supplement: Mighty shakes TID with meals and discontinue Ensure because patient does not like it Meals and Snacks: Cater to patient preferences   NUTRITION DIAGNOSIS:  Inadequate oral intake related to acute illness as evidenced by per patient/family report, meal completion < 50%, energy intake < 75% for > 7 days.   GOAL: Energy Intake: Patient will meet greater than or equal to 90% of their needs  MONITOR:  PO intake, Supplement acceptance  REASON FOR ASSESSMENT:   (RD Screen- LOS)    ASSESSMENT: Reason For Admission: weakness PMHx:  Past Medical History  Diagnosis Date  . Liver transplanted   . Macular degeneration   . Coronary artery disease   . Hypertension     Typical Fluid/ Food Intake: poor intake of meals while at hospital. Daughter at bedside reports that yesterday and this AM, patient has been eating much better. Banana bread this AM brought to room by the daughter and some chicken salad brought to the room yesterday by the daughter. Meal/ Snack Patterns: Daughter and husband at bedside report that patient hasn't been eating well x past 2 weeks. Prior to that, however, she was eating fine of a regular diet with no restricitions and was not taking any supplements. Supplements: Currently ordered Ensure BID, but does not like as she thinks it's "too sweet"  Labs:  Electrolyte and Renal Profile:    Recent Labs Lab 08/30/14 0430 09/01/14 0621  09/02/14 0620  09/04/14 0447 09/04/14 1243 09/05/14 0435  BUN 7 9  --  14  --   --   --   --   CREATININE 0.47 0.51  --  0.62  --   --   --   --   NA 125* 123*  < > 125*  < > 132* 129* 133*  K 4.4 3.5  --  3.8  --   --   --   --   < > = values in this interval not displayed. Protein Profile:  Recent Labs Lab 08/29/14 1605  ALBUMIN 3.2*   Meds: Megace, Remeron  UOP:  Intake/Output Summary (Last 24 hours) at 09/05/14  1149 Last data filed at 09/05/14 1009  Gross per 24 hour  Intake    490 ml  Output      0 ml  Net    490 ml    Physical Findings: n/a Weight Changes: Patient unable to recall recent weight history. Husband at bedside does not believe the patient has lost a significant amount of weight. Reviewed hospital records x 1.5 year ago. Patient was 141.9# at that time. No significant weight changes noted or reported.   Height:  Ht Readings from Last 1 Encounters:  08/29/14 5\' 5"  (1.651 m)    Weight:  Wt Readings from Last 1 Encounters:  08/30/14 143 lb 12.8 oz (65.227 kg)    Ideal Body Weight:     Wt Readings from Last 10 Encounters:  08/30/14 143 lb 12.8 oz (65.227 kg)  08/27/14 135 lb (61.236 kg)  08/25/14 134 lb 14.4 oz (61.19 kg)    BMI:  Body mass index is 23.93 kg/(m^2).  Skin:  Reviewed, no issues  Diet Order:  Diet regular Room service appropriate?: Yes; Fluid consistency:: Thin  EDUCATION NEEDS:  No education needs identified at this time   Intake/Output Summary (Last 24 hours) at 09/05/14 1145 Last data filed at 09/05/14 1009  Gross per 24 hour  Intake  490 ml  Output      0 ml  Net    490 ml    Last BM:  5/13  Roda Shutters, RDN Pager: [phone] Office: [place]

## 2014-09-05 NOTE — Progress Notes (Signed)
Patient is lethargic, arousable to voice. Reporting tolerable pain control. Up to chair and BSC with two assist. To discharge to [facility]. Report called to [facility]. Will call EMS for transport shortly.

## 2014-09-05 NOTE — Progress Notes (Signed)
Patient transporting to [facility] via EMS>

## 2014-09-05 NOTE — Progress Notes (Signed)
Per MD, sodium improved and pt is able to d/c to [facility]. Pt's dtr and husband at bedside and agreeable to d/c. Packet complete and RN to call report. CSW signing off. Wandra Feinstein, MSW, LCSW [phone] (weekend coverage)

## 2014-09-05 NOTE — Progress Notes (Signed)
Pt Na up to 133 this AM. Slept through shift, was lethargic and unable to take po medications last pm. BP and HR ran low at the beginning of shift, WNL this AM. Plan to dc to [facility] when Na sable.

## 2014-09-05 NOTE — Progress Notes (Signed)
[facility] [place] NAME: Brenda Roman    MR#:  [id number]  DATE OF BIRTH:  [birth year]  DATE OF ADMISSION:  08/29/2014 ADMITTING PHYSICIAN: Demetrios Loll, MD  DATE OF DISCHARGE: 09/05/2014  PRIMARY CARE PHYSICIAN: Kirk Ruths., MD    ADMISSION DIAGNOSIS:  Hypoxia [R09.02] Altered mental status, unspecified altered mental status type [R41.82]  DISCHARGE DIAGNOSIS:  * Pneumonia *Acute Hyponatremia (na 133 today) * h/o Choric afib now off Xarelto *Adult FTT  SECONDARY DIAGNOSIS:   Past Medical History  Diagnosis Date  . Liver transplanted   . Macular degeneration   . Coronary artery disease   . Hypertension     HOSPITAL COURSE:    81 yo female w/ hx of Primary Billiary Cirrhosis s/p Liver Transplant, hx of recent TIA, chronic a. Fib who presented to the hospital w/ shortness of breath.   * Pneumonia - suspected cause of pt's shortness of breath on admission. Although pt's CXR (-) for pneumonia.  - cont. Levaquin and clinically stable. Cultures (-).   * Hyponatremia - multifactorial. ? SIADH, tea/toast diet, adrenal insufficency.  - Urine sodium > 10. Urine Osm > 100 and serum Osm 259. - appreciate Nephro input and was on 3% saline and sodium improved to 132 and 3% saline stopped. Sodium now 133. Cont Nacl pill 1 g bid  * AMS/Encephalopathy - etiology unclear but likely metabolic encephalopathy from hyponatremia.  - mental status slowly improving. ?? Underlying dementia.  - pt. 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 Unlikely this is source of her AMS. Cont. To hold Xarelto  * hx of Liver Transplant - cont. Cellcept, Prednisone.   * hx of Chronic a. Fib - rate controlled.  - cont. Toprol. will d/c  Xarelto indefinitely now given high fall risk and debility.   * Adult Failure to Thrive - cont. Ensure supplements.  - cont. Megace, Remeron at bedtime.   Palliative Care following and pt. Remains DNR   D/c to SNF today. Plan of care discussed w/ family and also Dr. Candiss Norse (nephrology) DISCHARGE CONDITIONS:   fair  CONSULTS OBTAINED:  Treatment Team:  Murlean Iba, MD  DRUG ALLERGIES:   Allergies  Allergen Reactions  . Cephalexin Other (See Comments)    Reaction:  Unknown  . Novocain [Procaine] Rash  . Sulfa Antibiotics Rash    DISCHARGE MEDICATIONS:   Current Discharge Medication List    START taking these medications   Details  levofloxacin (LEVAQUIN) 500 MG tablet Take 1 tablet (500 mg total) by mouth every other day. Qty: 2 tablet, Refills: 0    megestrol (MEGACE) 40 MG/ML suspension Take 10 mLs (400 mg total) by mouth 2 (two) times daily. Qty: 240 mL, Refills: 0    mirtazapine (REMERON) 15 MG tablet Take 1 tablet (15 mg total) by mouth at bedtime. Qty: 30 tablet, Refills: 1    sodium chloride 1 G tablet Take 1 tablet (1 g total) by mouth daily. Qty: 30 tablet, Refills: 0      CONTINUE these medications which have NOT CHANGED   Details  aspirin EC 81 MG tablet Take 81 mg by mouth 2 (two) times daily.     beta carotene w/minerals (OCUVITE) tablet Take 1 tablet by mouth 2 (two) times daily.    Cholecalciferol (  VITAMIN D3) 1000 UNITS CAPS Take 2 capsules by mouth daily.    metoprolol succinate (TOPROL-XL) 50 MG 24 hr tablet Take 50-100 mg by mouth 2 (two) times daily. Pt takes 2 tabs (100mg ) every morning, and 1 tab (50mg ) every evening    mycophenolate (CELLCEPT) 500 MG tablet Take 500 mg by mouth every 12 (twelve) hours.    CALCIUM CARBONATE PO Take 1 tablet by mouth daily.    ezetimibe-simvastatin (VYTORIN) 10-20 MG per tablet Take 1 tablet by mouth at bedtime.    nitroGLYCERIN (NITROSTAT) 0.4 MG SL tablet Place 0.4 mg under the tongue every 5  (five) minutes as needed for chest pain.    predniSONE (DELTASONE) 5 MG tablet Take 5 mg by mouth daily.      STOP taking these medications     rivaroxaban (XARELTO) 20 MG TABS tablet      ondansetron (ZOFRAN) 4 MG tablet      zolpidem (AMBIEN) 5 MG tablet          DISCHARGE INSTRUCTIONS:    As above  If you experience worsening of your admission symptoms, develop shortness of breath, life threatening emergency, suicidal or homicidal thoughts you must seek medical attention immediately by calling 911 or calling your MD immediately  if symptoms less severe.  You Must read complete instructions/literature along with all the possible adverse reactions/side effects for all the Medicines you take and that have been prescribed to you. Take any new Medicines after you have completely understood and accept all the possible adverse reactions/side effects.   Please note  You were cared for by a hospitalist during your hospital stay. If you have any questions about your discharge medications or the care you received while you were in the hospital after you are discharged, you can call the unit and asked to speak with the hospitalist on call if the hospitalist that took care of you is not available. Once you are discharged, your primary care physician will handle any further medical issues. Please note that NO REFILLS for any discharge medications will be authorized once you are discharged, as it is imperative that you return to your primary care physician (or establish a relationship with a primary care physician if you do not have one) for your aftercare needs so that they can reassess your need for medications and monitor your lab values.    Today   SUBJECTIVE   Weak. Not much complaints. Tolerating little po diet  VITAL SIGNS:  Blood pressure 123/66, pulse 75, temperature 98.8 F (37.1 C), temperature source Oral, resp. rate 16, height 5\' 5"  (1.651 m), weight 65.227 kg (143 lb 12.8  oz), SpO2 96 %.  I/O:   Intake/Output Summary (Last 24 hours) at 09/05/14 1112 Last data filed at 09/05/14 1009  Gross per 24 hour  Intake    490 ml  Output      0 ml  Net    490 ml    PHYSICAL EXAMINATION:  GENERAL:  81 y.o.-year-old patient lying in the bed with no acute distress.  EYES: Pupils equal, round, reactive to light and accommodation. No scleral icterus. Extraocular muscles intact.  HEENT: Head atraumatic, normocephalic. Oropharynx and nasopharynx clear.  NECK:  Supple, no jugular venous distention. No thyroid enlargement, no tenderness.  LUNGS: Normal breath sounds bilaterally, no wheezing, rales,rhonchi or crepitation. No use of accessory muscles of respiration. Left sling+  CARDIOVASCULAR: S1, S2 normal. No murmurs, rubs, or gallops.  ABDOMEN: Soft, non-tender, non-distended. Bowel sounds present.  No organomegaly or mass.  EXTREMITIES: No pedal edema, cyanosis, or clubbing.  -bruises + NEUROLOGIC: Cranial nerves II through XII are intact. Muscle strength 5/5 in all extremities. Sensation intact. Gait not checked.  PSYCHIATRIC: The patient is alert and oriented x 3.  SKIN: No obvious rash, lesion, or ulcer.   DATA REVIEW:   CBC  Recent Labs Lab 08/30/14 0430  WBC 8.9  HGB 9.6*  HCT 30.1*  PLT 298    Chemistries   Recent Labs Lab 08/29/14 1605  09/02/14 0620  09/05/14 0435  NA 125*  < > 125*  < > 133*  K 5.0  < > 3.8  --   --   CL 90*  < > 92*  --   --   CO2 27  < > 28  --   --   GLUCOSE 130*  < > 91  --   --   BUN 9  < > 14  --   --   CREATININE 0.58  < > 0.62  --   --   CALCIUM 8.0*  < > 8.6*  --   --   AST 18  --   --   --   --   ALT 12*  --   --   --   --   ALKPHOS 59  --   --   --   --   BILITOT 0.5  --   --   --   --   < > = values in this interval not displayed.  Cardiac Enzymes No results for input(s): TROPONINI in the last 168 hours.  Microbiology Results  Results for orders placed or performed during the hospital encounter of  08/29/14  Culture, blood (routine x 2)     Status: None   Collection Time: 08/29/14  4:00 PM  Result Value Ref Range Status   Specimen Description BLOOD  Final   Special Requests BLOOD  Final   Culture NO GROWTH 5 DAYS  Final   Report Status 09/03/2014 FINAL  Final  Culture, blood (routine x 2)     Status: None   Collection Time: 08/29/14  4:05 PM  Result Value Ref Range Status   Specimen Description BLOOD  Final   Special Requests BLOOD  Final   Culture NO GROWTH 5 DAYS  Final   Report Status 09/03/2014 FINAL  Final   Management plans discussed with the patient, family and they are in agreement.  CODE STATUS:     Code Status Orders        Start     Ordered   08/29/14 2102  Do not attempt resuscitation (DNR)   Continuous    Question Answer Comment  In the event of cardiac or respiratory ARREST Do not call a "code blue"   In the event of cardiac or respiratory ARREST Do not perform Intubation, CPR, defibrillation or ACLS   In the event of cardiac or respiratory ARREST Use medication by any route, position, wound care, and other measures to relive pain and suffering. May use oxygen, suction and manual treatment of airway obstruction as needed for comfort.      08/29/14 2102    Advance Directive Documentation        Most Recent Value   Type of Advance Directive  Living will   Pre-existing out of facility DNR order (yellow form or pink MOST form)     "MOST" Form in Place?  TOTAL TIME TAKING CARE OF THIS PATIENT: 106minutes.    Keston Seever M.D on 09/05/2014 at 11:12 AM  Between 7am to 6pm - Pager - [phone] After 6pm go to [email or web address] - password [email or web address] [facility]  Office  [phone]  CC: Primary care physician; Kirk Ruths., MD

## 2014-09-07 DIAGNOSIS — E871 Hypo-osmolality and hyponatremia: Secondary | ICD-10-CM | POA: Diagnosis present

## 2014-09-07 DIAGNOSIS — E876 Hypokalemia: Secondary | ICD-10-CM | POA: Diagnosis not present

## 2014-09-07 DIAGNOSIS — R531 Weakness: Secondary | ICD-10-CM | POA: Diagnosis not present

## 2014-09-07 LAB — COMPREHENSIVE METABOLIC PANEL
ALT: 42 U/L (ref 14–54)
ANION GAP: 11 (ref 5–15)
AST: 44 U/L — AB (ref 15–41)
Albumin: 3.2 g/dL — ABNORMAL LOW (ref 3.5–5.0)
Alkaline Phosphatase: 69 U/L (ref 38–126)
BUN: 14 mg/dL (ref 6–20)
CALCIUM: 8.5 mg/dL — AB (ref 8.9–10.3)
CO2: 21 mmol/L — ABNORMAL LOW (ref 22–32)
CREATININE: 0.6 mg/dL (ref 0.44–1.00)
Chloride: 97 mmol/L — ABNORMAL LOW (ref 101–111)
GFR calc Af Amer: >60 mL/min (ref >60)
GFR calc non Af Amer: >60 mL/min (ref >60)
Glucose, Bld: 114 mg/dL — ABNORMAL HIGH (ref 65–99)
Potassium: 4.9 mmol/L (ref 3.5–5.1)
Sodium: 129 mmol/L — ABNORMAL LOW (ref 135–145)
TOTAL PROTEIN: 6 g/dL — AB (ref 6.5–8.1)
Total Bilirubin: 0.5 mg/dL (ref 0.3–1.2)

## 2014-09-07 LAB — CBC WITH DIFFERENTIAL/PLATELET
Basophils Absolute: 0 10*3/uL (ref 0–0.1)
Eosinophils Absolute: 0 10*3/uL (ref 0–0.7)
Eosinophils Relative: 0 %
HCT: 30.7 % — ABNORMAL LOW (ref 35.0–47.0)
HEMOGLOBIN: 9.8 g/dL — AB (ref 12.0–16.0)
LYMPHS ABS: 0.7 10*3/uL — AB (ref 1.0–3.6)
MCH: 26.1 pg (ref 26.0–34.0)
MCHC: 32.1 g/dL (ref 32.0–36.0)
MCV: 81.2 fL (ref 80.0–100.0)
MONO ABS: 0.4 10*3/uL (ref 0.2–0.9)
Neutro Abs: 12 10*3/uL — ABNORMAL HIGH (ref 1.4–6.5)
Neutrophils Relative %: 92 %
Platelets: 440 10*3/uL (ref 150–440)
RBC: 3.78 MIL/uL — AB (ref 3.80–5.20)
RDW: 16.9 % — ABNORMAL HIGH (ref 11.5–14.5)
WBC: 13.1 10*3/uL — ABNORMAL HIGH (ref 3.6–11.0)

## 2014-09-07 LAB — URINALYSIS COMPLETE WITH MICROSCOPIC (ARMC ONLY)
BILIRUBIN URINE: NEGATIVE
Glucose, UA: NEGATIVE mg/dL
HGB URINE DIPSTICK: NEGATIVE
KETONES UR: NEGATIVE mg/dL
Nitrite: NEGATIVE
Protein, ur: NEGATIVE mg/dL
Specific Gravity, Urine: 1.011 (ref 1.005–1.030)
pH: 7 (ref 5.0–8.0)

## 2014-09-07 LAB — TSH: TSH: 0.875 u[IU]/mL (ref 0.350–4.500)

## 2014-09-07 LAB — BASIC METABOLIC PANEL
Anion gap: 7 (ref 5–15)
BUN: 14 mg/dL (ref 6–20)
CALCIUM: 8.6 mg/dL — AB (ref 8.9–10.3)
CO2: 28 mmol/L (ref 22–32)
CREATININE: 0.72 mg/dL (ref 0.44–1.00)
Chloride: 97 mmol/L — ABNORMAL LOW (ref 101–111)
GFR calc Af Amer: >60 mL/min (ref >60)
GFR calc non Af Amer: >60 mL/min (ref >60)
Glucose, Bld: 92 mg/dL (ref 65–99)
Potassium: 4.5 mmol/L (ref 3.5–5.1)
Sodium: 132 mmol/L — ABNORMAL LOW (ref 135–145)

## 2014-09-07 LAB — LIPID PANEL
CHOL/HDL RATIO: 5.3 ratio
CHOLESTEROL: 132 mg/dL (ref 0–200)
HDL: 25 mg/dL — ABNORMAL LOW (ref >40)
LDL CALC: 80 mg/dL (ref 0–99)
Triglycerides: 134 mg/dL (ref <150)
VLDL: 27 mg/dL (ref 0–40)

## 2014-09-07 LAB — MAGNESIUM: MAGNESIUM: 1.9 mg/dL (ref 1.7–2.4)

## 2014-09-07 LAB — VITAMIN B12: Vitamin B-12: 1536 pg/mL — ABNORMAL HIGH (ref 180–914)

## 2014-09-07 LAB — SEDIMENTATION RATE: SED RATE: 16 mm/h (ref 0–30)

## 2014-09-08 DIAGNOSIS — E871 Hypo-osmolality and hyponatremia: Secondary | ICD-10-CM | POA: Diagnosis not present

## 2014-09-08 LAB — BASIC METABOLIC PANEL
ANION GAP: 8 (ref 5–15)
BUN: 13 mg/dL (ref 6–20)
CALCIUM: 8.3 mg/dL — AB (ref 8.9–10.3)
CO2: 24 mmol/L (ref 22–32)
CREATININE: 0.65 mg/dL (ref 0.44–1.00)
Chloride: 92 mmol/L — ABNORMAL LOW (ref 101–111)
GFR calc Af Amer: >60 mL/min (ref >60)
GLUCOSE: 115 mg/dL — AB (ref 65–99)
Potassium: 3.9 mmol/L (ref 3.5–5.1)
Sodium: 124 mmol/L — ABNORMAL LOW (ref 135–145)

## 2014-09-08 LAB — VITAMIN D 25 HYDROXY (VIT D DEFICIENCY, FRACTURES): VIT D 25 HYDROXY: 21 ng/mL — AB (ref 30.0–100.0)

## 2014-09-09 DIAGNOSIS — E871 Hypo-osmolality and hyponatremia: Secondary | ICD-10-CM | POA: Diagnosis not present

## 2014-09-09 LAB — COMPREHENSIVE METABOLIC PANEL
ALT: 63 U/L — ABNORMAL HIGH (ref 14–54)
ANION GAP: 8 (ref 5–15)
AST: 45 U/L — ABNORMAL HIGH (ref 15–41)
Albumin: 3 g/dL — ABNORMAL LOW (ref 3.5–5.0)
Alkaline Phosphatase: 75 U/L (ref 38–126)
BUN: 9 mg/dL (ref 6–20)
CALCIUM: 8.4 mg/dL — AB (ref 8.9–10.3)
CO2: 24 mmol/L (ref 22–32)
CREATININE: 0.62 mg/dL (ref 0.44–1.00)
Chloride: 97 mmol/L — ABNORMAL LOW (ref 101–111)
GFR calc non Af Amer: >60 mL/min (ref >60)
GLUCOSE: 88 mg/dL (ref 65–99)
Potassium: 4.1 mmol/L (ref 3.5–5.1)
Sodium: 129 mmol/L — ABNORMAL LOW (ref 135–145)
Total Bilirubin: 0.4 mg/dL (ref 0.3–1.2)
Total Protein: 5.6 g/dL — ABNORMAL LOW (ref 6.5–8.1)

## 2014-09-09 LAB — URINE CULTURE: Culture: NO GROWTH

## 2014-09-13 DIAGNOSIS — E871 Hypo-osmolality and hyponatremia: Secondary | ICD-10-CM | POA: Diagnosis not present

## 2014-09-13 LAB — BASIC METABOLIC PANEL
Anion gap: 6 (ref 5–15)
BUN: 14 mg/dL (ref 6–20)
CHLORIDE: 96 mmol/L — AB (ref 101–111)
CO2: 26 mmol/L (ref 22–32)
Calcium: 8.2 mg/dL — ABNORMAL LOW (ref 8.9–10.3)
Creatinine, Ser: 0.68 mg/dL (ref 0.44–1.00)
GFR calc Af Amer: >60 mL/min (ref >60)
GFR calc non Af Amer: >60 mL/min (ref >60)
GLUCOSE: 92 mg/dL (ref 65–99)
Potassium: 4.4 mmol/L (ref 3.5–5.1)
Sodium: 128 mmol/L — ABNORMAL LOW (ref 135–145)

## 2014-09-16 DIAGNOSIS — E871 Hypo-osmolality and hyponatremia: Secondary | ICD-10-CM | POA: Diagnosis not present

## 2014-09-16 LAB — COMPREHENSIVE METABOLIC PANEL
ALT: 103 U/L — ABNORMAL HIGH (ref 14–54)
ANION GAP: 10 (ref 5–15)
AST: 47 U/L — ABNORMAL HIGH (ref 15–41)
Albumin: 3.5 g/dL (ref 3.5–5.0)
Alkaline Phosphatase: 110 U/L (ref 38–126)
BILIRUBIN TOTAL: 0.5 mg/dL (ref 0.3–1.2)
BUN: 16 mg/dL (ref 6–20)
CHLORIDE: 95 mmol/L — AB (ref 101–111)
CO2: 27 mmol/L (ref 22–32)
Calcium: 9.1 mg/dL (ref 8.9–10.3)
Creatinine, Ser: 0.8 mg/dL (ref 0.44–1.00)
GFR calc non Af Amer: >60 mL/min (ref >60)
GLUCOSE: 126 mg/dL — AB (ref 65–99)
POTASSIUM: 4.2 mmol/L (ref 3.5–5.1)
Sodium: 132 mmol/L — ABNORMAL LOW (ref 135–145)
Total Protein: 6.3 g/dL — ABNORMAL LOW (ref 6.5–8.1)

## 2014-09-22 ENCOUNTER — Other Ambulatory Visit
Admission: RE | Admit: 2014-09-22 | Discharge: 2014-09-22 | Disposition: A | Discharge status: Home / Self Care | Payer: Medicare Other | Source: Ambulatory Visit | Attending: Ophthalmology | Admitting: Ophthalmology

## 2014-09-22 DIAGNOSIS — R531 Weakness: Secondary | ICD-10-CM | POA: Insufficient documentation

## 2014-09-22 DIAGNOSIS — E876 Hypokalemia: Secondary | ICD-10-CM | POA: Insufficient documentation

## 2014-09-22 DIAGNOSIS — I639 Cerebral infarction, unspecified: Secondary | ICD-10-CM

## 2014-09-22 HISTORY — DX: Cerebral infarction, unspecified: I63.9

## 2014-09-22 LAB — CBC WITH DIFFERENTIAL/PLATELET
BASOS PCT: 1 %
Basophils Absolute: 0 10*3/uL (ref 0–0.1)
EOS ABS: 0.1 10*3/uL (ref 0–0.7)
Eosinophils Relative: 1 %
HCT: 32.4 % — ABNORMAL LOW (ref 35.0–47.0)
Hemoglobin: 10.6 g/dL — ABNORMAL LOW (ref 12.0–16.0)
LYMPHS PCT: 10 %
Lymphs Abs: 0.9 10*3/uL — ABNORMAL LOW (ref 1.0–3.6)
MCH: 27.4 pg (ref 26.0–34.0)
MCHC: 32.8 g/dL (ref 32.0–36.0)
MCV: 83.5 fL (ref 80.0–100.0)
Monocytes Absolute: 0.5 10*3/uL (ref 0.2–0.9)
Monocytes Relative: 6 %
NEUTROS PCT: 82 %
Neutro Abs: 7.4 10*3/uL — ABNORMAL HIGH (ref 1.4–6.5)
Platelets: 374 10*3/uL (ref 150–440)
RBC: 3.88 MIL/uL (ref 3.80–5.20)
RDW: 17.5 % — ABNORMAL HIGH (ref 11.5–14.5)
WBC: 9 10*3/uL (ref 3.6–11.0)

## 2014-09-22 LAB — SEDIMENTATION RATE: Sed Rate: 13 mm/hr (ref 0–30)

## 2014-09-23 LAB — MISC LABCORP TEST (SEND OUT): LABCORP TEST CODE: 120766

## 2014-09-29 DIAGNOSIS — S42213A Unspecified displaced fracture of surgical neck of unspecified humerus, initial encounter for closed fracture: Secondary | ICD-10-CM | POA: Insufficient documentation

## 2014-10-07 DIAGNOSIS — H47099 Other disorders of optic nerve, not elsewhere classified, unspecified eye: Secondary | ICD-10-CM | POA: Insufficient documentation

## 2014-10-07 DIAGNOSIS — N390 Urinary tract infection, site not specified: Secondary | ICD-10-CM | POA: Insufficient documentation

## 2014-10-07 DIAGNOSIS — I639 Cerebral infarction, unspecified: Secondary | ICD-10-CM | POA: Insufficient documentation

## 2014-10-07 DIAGNOSIS — Z8673 Personal history of transient ischemic attack (TIA), and cerebral infarction without residual deficits: Secondary | ICD-10-CM | POA: Insufficient documentation

## 2014-10-11 DIAGNOSIS — S32000A Wedge compression fracture of unspecified lumbar vertebra, initial encounter for closed fracture: Secondary | ICD-10-CM | POA: Insufficient documentation

## 2014-10-15 DIAGNOSIS — H30899 Other chorioretinal inflammations, unspecified eye: Secondary | ICD-10-CM | POA: Insufficient documentation

## 2014-10-19 ENCOUNTER — Other Ambulatory Visit: Payer: Self-pay | Admitting: Orthopedic Surgery

## 2014-10-19 DIAGNOSIS — M549 Dorsalgia, unspecified: Secondary | ICD-10-CM

## 2014-10-21 ENCOUNTER — Ambulatory Visit: Payer: Medicare Other

## 2014-12-26 ENCOUNTER — Encounter: Payer: Self-pay | Admitting: *Deleted

## 2014-12-26 ENCOUNTER — Emergency Department

## 2014-12-26 ENCOUNTER — Emergency Department
Admission: EM | Admit: 2014-12-26 | Discharge: 2014-12-26 | Disposition: A | Discharge status: Home / Self Care | Attending: Emergency Medicine | Admitting: Emergency Medicine

## 2014-12-26 DIAGNOSIS — Z7982 Long term (current) use of aspirin: Secondary | ICD-10-CM | POA: Diagnosis not present

## 2014-12-26 DIAGNOSIS — Z7901 Long term (current) use of anticoagulants: Secondary | ICD-10-CM | POA: Diagnosis not present

## 2014-12-26 DIAGNOSIS — Z87891 Personal history of nicotine dependence: Secondary | ICD-10-CM | POA: Diagnosis not present

## 2014-12-26 DIAGNOSIS — Z7952 Long term (current) use of systemic steroids: Secondary | ICD-10-CM | POA: Diagnosis not present

## 2014-12-26 DIAGNOSIS — Z79899 Other long term (current) drug therapy: Secondary | ICD-10-CM | POA: Insufficient documentation

## 2014-12-26 DIAGNOSIS — N3000 Acute cystitis without hematuria: Secondary | ICD-10-CM | POA: Diagnosis not present

## 2014-12-26 DIAGNOSIS — I10 Essential (primary) hypertension: Secondary | ICD-10-CM | POA: Insufficient documentation

## 2014-12-26 DIAGNOSIS — R109 Unspecified abdominal pain: Secondary | ICD-10-CM

## 2014-12-26 DIAGNOSIS — N390 Urinary tract infection, site not specified: Secondary | ICD-10-CM

## 2014-12-26 HISTORY — DX: Heart failure, unspecified: I50.9

## 2014-12-26 HISTORY — DX: Transient cerebral ischemic attack, unspecified: G45.9

## 2014-12-26 HISTORY — DX: Zoster without complications: B02.9

## 2014-12-26 LAB — COMPREHENSIVE METABOLIC PANEL
ALT: 27 U/L (ref 14–54)
AST: 35 U/L (ref 15–41)
Albumin: 3.9 g/dL (ref 3.5–5.0)
Alkaline Phosphatase: 113 U/L (ref 38–126)
Anion gap: 10 (ref 5–15)
BUN: 15 mg/dL (ref 6–20)
CHLORIDE: 103 mmol/L (ref 101–111)
CO2: 27 mmol/L (ref 22–32)
CREATININE: 0.86 mg/dL (ref 0.44–1.00)
Calcium: 10 mg/dL (ref 8.9–10.3)
GFR calc non Af Amer: 59 mL/min — ABNORMAL LOW (ref >60)
Glucose, Bld: 88 mg/dL (ref 65–99)
Potassium: 3.8 mmol/L (ref 3.5–5.1)
SODIUM: 140 mmol/L (ref 135–145)
Total Bilirubin: 0.4 mg/dL (ref 0.3–1.2)
Total Protein: 6.8 g/dL (ref 6.5–8.1)

## 2014-12-26 LAB — CBC
HCT: 30.7 % — ABNORMAL LOW (ref 35.0–47.0)
Hemoglobin: 10 g/dL — ABNORMAL LOW (ref 12.0–16.0)
MCH: 31.9 pg (ref 26.0–34.0)
MCHC: 32.7 g/dL (ref 32.0–36.0)
MCV: 97.5 fL (ref 80.0–100.0)
PLATELETS: 404 10*3/uL (ref 150–440)
RBC: 3.15 MIL/uL — ABNORMAL LOW (ref 3.80–5.20)
RDW: 22.7 % — AB (ref 11.5–14.5)
WBC: 7.1 10*3/uL (ref 3.6–11.0)

## 2014-12-26 LAB — LIPASE, BLOOD: LIPASE: 38 U/L (ref 22–51)

## 2014-12-26 LAB — URINALYSIS COMPLETE WITH MICROSCOPIC (ARMC ONLY)
BILIRUBIN URINE: NEGATIVE
Glucose, UA: NEGATIVE mg/dL
Ketones, ur: NEGATIVE mg/dL
NITRITE: NEGATIVE
Protein, ur: NEGATIVE mg/dL
SPECIFIC GRAVITY, URINE: 1.004 — AB (ref 1.005–1.030)
pH: 6 (ref 5.0–8.0)

## 2014-12-26 MED ORDER — OXYCODONE-ACETAMINOPHEN 5-325 MG PO TABS
1.0000 | ORAL_TABLET | Freq: Once | ORAL | Status: AC
  Administered 2014-12-26: 1 via ORAL
  Filled 2014-12-26: qty 1

## 2014-12-26 MED ORDER — LEVOFLOXACIN 750 MG PO TABS
750.0000 mg | ORAL_TABLET | Freq: Once | ORAL | Status: AC
  Administered 2014-12-26: 750 mg via ORAL
  Filled 2014-12-26: qty 1

## 2014-12-26 MED ORDER — LEVOFLOXACIN 500 MG PO TABS: 500.0000 mg | ORAL_TABLET | Freq: Every day | ORAL | Status: AC

## 2014-12-26 NOTE — ED Notes (Signed)
Discharge instructions, prescriptions, and follow-up care reviewed with patient and spouse. No questions or concerns at this time.

## 2014-12-26 NOTE — ED Provider Notes (Signed)
Patient was turned over by my colleague Dr. Dahlia Client. The patient's had an extensive workup which shows a urinary tract infection. Renal colic CT was ordered to rule out inflammatory process such as diverticulitis, kidney stone, etc.  CT ABDOMEN AND PELVIS WITHOUT CONTRAST  TECHNIQUE: Multidetector CT imaging of the abdomen and pelvis was performed following the standard protocol without IV contrast.  COMPARISON: 09/01/2014 and prior radiographs  FINDINGS: Please note that parenchymal abnormalities may be missed without intravenous contrast.  Lower chest: Cardiomegaly and mild bibasilar scarring noted. Coronary artery calcifications are present.  Hepatobiliary: Gas in a linear orientation along the medial inferior liver (image 16) likely represents pneumobilia. The remainder the liver is unremarkable. The patient is status post cholecystectomy. There is no evidence of biliary dilatation.  Pancreas: Unremarkable  Spleen: Unremarkable  Adrenals/Urinary Tract: New diffuse circumferential bladder wall thickening is noted and may represent a cystitis/infection. New mild-moderate new left renal atrophy and probable bilateral renal cysts noted. There is no evidence of hydronephrosis or urinary calculi. The adrenal glands are unremarkable.  Stomach/Bowel: Colonic diverticulosis noted without diverticulitis. Small low right inguinal/ ventral pelvic wall hernia identified containing small bowel bowel without bowel obstruction. There is no evidence of focal bowel wall thickening.  Vascular/Lymphatic: No enlarged lymph nodes or abdominal aortic aneurysm. Aortic atherosclerotic calcifications identified.  Reproductive: Retroverted uterus. No adnexal masses identified.  Other: No free fluid.  Musculoskeletal: There are compression fractures of T12, L1, L2, L3 and L5 -age indeterminate but sclerosis within the L3 and L5 compression fractures suggest new subacute to remote. There is  no evidence of bony retropulsion.  IMPRESSION: Circumferential bladder wall thickening which may represent cystitis/infection -correlate clinically.  Age indeterminate compression fractures of T12, L1, L2, L3 and L5. If there is clinical concern for acute fracture, recommend MR.  Linear gas along the medial inferior liver. Strongly favor this represents pneumobilia rather than pneumoperitoneum as there is no other evidence of pneumoperitoneum or bowel inflammation, and the patient has had a liver transplant. Consider followup as indicated.  Cardiomegaly, coronary artery disease and aortic atherosclerosis.   Patient's CAT scan showed findings consistent with cystitis.  Patient was CAT scan overall was considered to be normal with no obvious inflammatory process. CAT scan of the slide the bladder thickening with cystitis. Patient received a dose of Levaquin here him be discharged on a 1 week course of Levaquin with urine culture pending.  Daymon Larsen, MD 12/26/14 [phone]

## 2014-12-26 NOTE — ED Notes (Signed)
Report to bill, rn.  

## 2014-12-26 NOTE — ED Notes (Signed)
Pt presents w/ L back and flank pain starting last night at approximately 1900-2000. Pt denies n/v. Pt had 2-3 episodes of diarrhea last night. Pt has significant cardiac hx and stroke hx. Pt has had liver transplant. Pt is non-ambulatory and total care. Pt is blind.

## 2014-12-26 NOTE — Discharge Instructions (Signed)

## 2014-12-26 NOTE — ED Provider Notes (Signed)
[facility] Emergency Department Provider Note  ____________________________________________  Time seen: Approximately 892 AM  I have reviewed the triage vital signs and the nursing notes.   HISTORY  Chief Complaint Flank Pain    HPI Brenda Roman is a 81 y.o. female who comes in with left flank pain. The patient's family reports that she had diarrhea during the day yesterday and developed flank pain in the evening. The patient's husband reports that they called hospice and they mentioned seeing how the patient did over the evening. The patient's husband reports that she continued to complain of flank pain and he gave her a dose of Tylenol. Since the patient continued to have pain they decided to bring her into the hospital for evaluation. The patient has had some intermittent difficulty with urinating for the past 3 weeks. She has not had any fevers or vomiting. The patient has multiple medical problems including cardiac disease and recent stroke. The patient has had some confusion since her stroke as well as some difficulty speaking. The patient has had no chest pain is had some mild intermittent shortness of breath but nothing at this time as well. Given the patient's pain the patient has been decided to bring her in for further evaluation.   Past Medical History  Diagnosis Date  . Liver transplanted   . Macular degeneration   . Coronary artery disease   . Hypertension   . Shingles   . CHF (congestive heart failure)   . TIA (transient ischemic attack)     Patient Active Problem List   Diagnosis Date Noted  . Goals of care, counseling/discussion   . Acute encephalopathy 08/29/2014  . Hypoxia 08/29/2014  . Healthcare-associated pneumonia 08/29/2014  . Pneumonia 08/29/2014  . CVA (cerebral infarction) 08/23/2014    Past Surgical History  Procedure Laterality Date  . Coronary artery bypass graft    . Joint replacement    . Abdominal surgery       Current Outpatient Rx  Name  Route  Sig  Dispense  Refill  . aspirin EC 81 MG tablet   Oral   Take 81 mg by mouth 2 (two) times daily.          Marland Kitchen CALCIUM CARBONATE PO   Oral   Take 1 tablet by mouth daily.         . Cholecalciferol (VITAMIN D3) 1000 UNITS CAPS   Oral   Take 2 capsules by mouth daily.         . diclofenac sodium (VOLTAREN) 1 % GEL   Topical   Apply 2 g topically 2 (two) times daily.         Marland Kitchen ezetimibe-simvastatin (VYTORIN) 10-20 MG per tablet   Oral   Take 1 tablet by mouth at bedtime.         Marland Kitchen LACTULOSE PO   Oral   Take 1 Dose by mouth as needed.         . metoprolol (LOPRESSOR) 100 MG tablet   Oral   Take 100 mg by mouth daily.         . metoprolol succinate (TOPROL-XL) 50 MG 24 hr tablet   Oral   Take 50-100 mg by mouth 2 (two) times daily. Pt takes 2 tabs (100mg ) every morning, and 1 tab (50mg ) every evening         . mirtazapine (REMERON) 15 MG tablet   Oral   Take 1 tablet (15 mg total) by mouth at bedtime.  30 tablet   1   . mycophenolate (CELLCEPT) 500 MG tablet   Oral   Take 500 mg by mouth every 12 (twelve) hours.         . nitroGLYCERIN (NITROSTAT) 0.4 MG SL tablet   Sublingual   Place 0.4 mg under the tongue every 5 (five) minutes as needed for chest pain.         . predniSONE (DELTASONE) 5 MG tablet   Oral   Take 5 mg by mouth daily.         . rivaroxaban (XARELTO) 20 MG TABS tablet   Oral   Take 20 mg by mouth daily with supper.         . senna-docusate (SENOKOT-S) 8.6-50 MG per tablet   Oral   Take 2 tablets by mouth daily.         . sodium chloride 1 G tablet   Oral   Take 1 tablet (1 g total) by mouth daily.   30 tablet   0   . valACYclovir (VALTREX) 1000 MG tablet   Oral   Take 1,000 mg by mouth 2 (two) times daily.         Marland Kitchen zolpidem (AMBIEN) 5 MG tablet   Oral   Take 5 mg by mouth at bedtime as needed for sleep.           Allergies Cephalexin; Novocain; and Sulfa  antibiotics  History reviewed. No pertinent family history.  Social History Social History  Substance Use Topics  . Smoking status: Former Research scientist (life sciences)  . Smokeless tobacco: None  . Alcohol Use: No    Review of Systems Constitutional: No fever/chills Eyes: No visual changes. ENT: No sore throat. Cardiovascular: Denies chest pain. Respiratory: Denies shortness of breath. Gastrointestinal: No abdominal pain.  No nausea, no vomiting.  No diarrhea.  No constipation. Genitourinary: difficulty urinating Musculoskeletal: Flank pain Skin: Negative for rash. Neurological: Negative for headaches, focal weakness or numbness.  10-point ROS otherwise negative.  ____________________________________________   PHYSICAL EXAM:  VITAL SIGNS: ED Triage Vitals  Enc Vitals Group     BP 12/26/14 0410 197/83 mmHg     Pulse Rate 12/26/14 0410 66     Resp 12/26/14 0410 22     Temp 12/26/14 0410 98.2 F (36.8 C)     Temp Source 12/26/14 0410 Oral     SpO2 12/26/14 0410 100 %     Weight 12/26/14 0410 120 lb (54.432 kg)     Height 12/26/14 0410 5\' 2"  (1.575 m)     Head Cir --      Peak Flow --      Pain Score --      Pain Loc --      Pain Edu? --      Excl. in [place]? --     Constitutional: Alert,  Well appearing and in mild distress. Eyes: Conjunctivae are normal. PERRL. EOMI. Head: Atraumatic. Nose: No congestion/rhinnorhea. Mouth/Throat: Mucous membranes are moist.  Oropharynx non-erythematous. Cardiovascular: Normal rate, regular rhythm. Systolic murmur.  Good peripheral circulation. Respiratory: Normal respiratory effort.  No retractions. Lungs CTAB. Gastrointestinal: Soft and nontender. No distention. Positive bowel sounds. Left CVA tenderness. Musculoskeletal: No lower extremity tenderness nor edema.  No joint effusions. Neurologic:  Normal speech and language. No gross focal neurologic deficits are appreciated. No gait instability. Skin:  Skin is warm, dry and intact. No rash  noted. Psychiatric: Mood and affect are normal. .  ____________________________________________   LABS (all labs ordered  are listed, but only abnormal results are displayed)  Labs Reviewed  URINALYSIS COMPLETEWITH MICROSCOPIC ([place] ONLY) - Abnormal; Notable for the following:    Color, Urine YELLOW (*)    APPearance CLOUDY (*)    Specific Gravity, Urine 1.004 (*)    Hgb urine dipstick 1+ (*)    Leukocytes, UA 3+ (*)    Bacteria, UA FEW (*)    Squamous Epithelial / LPF 0-5 (*)    All other components within normal limits  COMPREHENSIVE METABOLIC PANEL - Abnormal; Notable for the following:    GFR calc non Af Amer 59 (*)    All other components within normal limits  CBC - Abnormal; Notable for the following:    RBC 3.15 (*)    Hemoglobin 10.0 (*)    HCT 30.7 (*)    RDW 22.7 (*)    All other components within normal limits  URINE CULTURE  LIPASE, BLOOD   ____________________________________________  EKG  ED ECG REPORT I, Loney Hering, the attending physician, personally viewed and interpreted this ECG.   Date: 12/26/2014  EKG Time: 439  Rate: 69  Rhythm: normal sinus rhythm, LBBB  Axis: Left axis deviation  Intervals:left bundle branch block seen on previous EKG  ST&T Change: LBBB  ____________________________________________  RADIOLOGY  CT renal stone study ____________________________________________   PROCEDURES  Procedure(s) performed: None  Critical Care performed: No  ____________________________________________   INITIAL IMPRESSION / ASSESSMENT AND PLAN / ED COURSE  Pertinent labs & imaging results that were available during my care of the patient were reviewed by me and considered in my medical decision making (see chart for details).  The patient came in and appears comfortable but did have some left flank pain. I gave the patient dose of Percocet as well as Levaquin for her urinary tract infection. Given the left-sided flank pain I  also ordered a CT renal stone study to evaluate for possible stone or diverticulitis. The patient's blood work otherwise looks well. The patient's care was signed out to Dr. Marcelene Butte will follow-up the results of the CT. If the CT is unremarkable the patient can be discharged home with oral Levaquin. Otherwise the patient has no further complaints or concerns. ____________________________________________   FINAL CLINICAL IMPRESSION(S) / ED DIAGNOSES  Final diagnoses:  Acute flank pain  Left flank pain  UTI (lower urinary tract infection)      Loney Hering, MD 12/26/14 [phone]

## 2014-12-28 LAB — URINE CULTURE: Culture: >=100000

## 2015-03-09 ENCOUNTER — Encounter: Payer: Self-pay | Admitting: *Deleted

## 2015-03-10 ENCOUNTER — Ambulatory Visit (INDEPENDENT_AMBULATORY_CARE_PROVIDER_SITE_OTHER): Payer: Medicare Other | Admitting: Obstetrics and Gynecology

## 2015-03-10 ENCOUNTER — Encounter: Payer: Self-pay | Admitting: Obstetrics and Gynecology

## 2015-03-10 VITALS — BP 97/62 | HR 75 | Resp 16 | Ht 62.0 in | Wt 118.0 lb

## 2015-03-10 DIAGNOSIS — N390 Urinary tract infection, site not specified: Secondary | ICD-10-CM | POA: Diagnosis not present

## 2015-03-10 LAB — URINALYSIS, COMPLETE
Bilirubin, UA: NEGATIVE
Glucose, UA: NEGATIVE
KETONES UA: NEGATIVE
Nitrite, UA: NEGATIVE
SPEC GRAV UA: 1.01 (ref 1.005–1.030)
Urobilinogen, Ur: 0.2 mg/dL (ref 0.2–1.0)
pH, UA: 5.5 (ref 5.0–7.5)

## 2015-03-10 LAB — MICROSCOPIC EXAMINATION
RBC, UA: >30 /hpf — ABNORMAL HIGH (ref 0–?)
WBC, UA: >30 /hpf — ABNORMAL HIGH (ref 0–?)

## 2015-03-10 NOTE — Patient Instructions (Signed)

## 2015-03-10 NOTE — Progress Notes (Signed)
03/10/2015 8:51 PM   Brenda Roman 1923-06-26 [id number]  Referring provider: Kirk Ruths, MD [place], [place] [address]  Chief Complaint  Patient presents with  . Recurrent UTI  . Establish Care    HPI: Patient is an 81yo female with a history of CHF, s/p liver transplant, TIA, macular degeneration, blindness currently under hospice care presenting today as a referral from her primary care provider for recurrent urinary tract infections. Patient has experienced at least 5 documented urinary tract infections with positive cultures over the last year. Three most recent cultures positive for increasingly resistant Klebsiella pneumoniae.  She was prescribed Monurol which she completed 2 days ago.  She has continued to experience some dysuria since completion.   Patient unable to quantify how much water she drinks daily stating that she "thinks it's plenty".   PMH: Past Medical History  Diagnosis Date  . Liver transplanted ([place])   . Macular degeneration   . Coronary artery disease   . Hypertension   . Shingles   . CHF (congestive heart failure) ([place])   . TIA (transient ischemic attack)     Surgical History: Past Surgical History  Procedure Laterality Date  . Coronary artery bypass graft    . Joint replacement    . Abdominal surgery    . Colonoscopy      Home Medications:    Medication List       This list is accurate as of: 03/10/15  8:51 PM.  Always use your most recent med list.               aspirin EC 81 MG tablet  Take 81 mg by mouth 2 (two) times daily.     CALCIUM CARBONATE PO  Take 1 tablet by mouth daily.     diclofenac sodium 1 % Gel  Commonly known as:  VOLTAREN  Apply 2 g topically 2 (two) times daily.     ezetimibe-simvastatin 10-20 MG tablet  Commonly known as:  VYTORIN  Take 1 tablet by mouth at bedtime.     fosfomycin 3 G Pack  Commonly known as:  MONUROL  Take every other day for three  doses.  Dissolve in 3-4 ounces of water.  HOSPICE PATIENT     LACTULOSE PO  Take 1 Dose by mouth as needed.     metoprolol succinate 50 MG 24 hr tablet  Commonly known as:  TOPROL-XL  Take 50-100 mg by mouth 2 (two) times daily. Pt takes 2 tabs (100mg ) every morning, and 1 tab (50mg ) every evening     mirtazapine 15 MG tablet  Commonly known as:  REMERON  Take 1 tablet (15 mg total) by mouth at bedtime.     mycophenolate 500 MG tablet  Commonly known as:  CELLCEPT  Take 500 mg by mouth every 12 (twelve) hours.     nitroGLYCERIN 0.4 MG SL tablet  Commonly known as:  NITROSTAT  Place 0.4 mg under the tongue every 5 (five) minutes as needed for chest pain.     predniSONE 5 MG tablet  Commonly known as:  DELTASONE  Take 5 mg by mouth daily.     rivaroxaban 20 MG Tabs tablet  Commonly known as:  XARELTO  Take 20 mg by mouth daily with supper.     senna-docusate 8.6-50 MG tablet  Commonly known as:  Senokot-S  Take 2 tablets by mouth daily.     sodium chloride 1 G tablet  Take 1 tablet (1 g total) by mouth daily.     valACYclovir 1000 MG tablet  Commonly known as:  VALTREX  Take 1,000 mg by mouth 2 (two) times daily.     Vitamin D3 1000 UNITS Caps  Take 2 capsules by mouth daily.     zolpidem 5 MG tablet  Commonly known as:  AMBIEN  Take 5 mg by mouth at bedtime as needed for sleep.        Allergies:  Allergies  Allergen Reactions  . Cephalexin Other (See Comments)    Reaction:  Unknown  . Novocain [Procaine] Rash  . Sulfa Antibiotics Rash    Family History: No family history on file.  Social History:  reports that she has quit smoking. She does not have any smokeless tobacco history on file. She reports that she does not drink alcohol or use illicit drugs.  ROS: UROLOGY Frequent Urination?: Yes Hard to postpone urination?: No Burning/pain with urination?: Yes Get up at night to urinate?: Yes Leakage of urine?: No Urine stream starts and stops?:  No Trouble starting stream?: Yes Do you have to strain to urinate?: Yes Blood in urine?: Yes Urinary tract infection?: Yes Sexually transmitted disease?: No Injury to kidneys or bladder?: No Painful intercourse?: No Weak stream?: No Currently pregnant?: No Vaginal bleeding?: No Last menstrual period?: n/a  Gastrointestinal Nausea?: No Vomiting?: No Indigestion/heartburn?: No Diarrhea?: Yes Constipation?: Yes  Constitutional Fever: No Night sweats?: No Weight loss?: No Fatigue?: Yes  Skin Skin rash/lesions?: No Itching?: No  Eyes Blurred vision?: No Double vision?: No  Ears/Nose/Throat Sore throat?: No Sinus problems?: No  Hematologic/Lymphatic Swollen glands?: No Easy bruising?: Yes  Cardiovascular Leg swelling?: Yes Chest pain?: No  Respiratory Cough?: No Shortness of breath?: No  Endocrine Excessive thirst?: No  Musculoskeletal Back pain?: No Joint pain?: No  Neurological Headaches?: No Dizziness?: No  Psychologic Depression?: No Anxiety?: No  Physical Exam: BP 97/62 mmHg  Pulse 75  Resp 16  Ht 5\' 2"  (1.575 m)  Wt 118 lb (53.524 kg)  BMI 21.58 kg/m2  Constitutional:  Alert and oriented, No acute distress. HEENT: [place] AT, moist mucus membranes.  Trachea midline, no masses. Cardiovascular: No clubbing, cyanosis, or edema. Respiratory: Normal respiratory effort, no increased work of breathing. GI: Abdomen is soft, nontender, nondistended, no abdominal masses GU: No CVA tenderness. Pelvic: external vaginal mucosa pale, decreased moisture, normal urinary meatus Skin: No rashes, bruises or suspicious lesions. Lymph: No cervical or inguinal adenopathy. Neurologic: Grossly intact, no focal deficits, moving all 4 extremities. Psychiatric: Normal mood and affect.  Laboratory Data:   Urinalysis Results for orders placed or performed in visit on 03/10/15  Microscopic Examination  Result Value Ref Range   WBC, UA >30 (H) 0 -  5 /hpf    RBC, UA >30 (H) 0 -  2 /hpf   Epithelial Cells (non renal) 0-10 0 - 10 /hpf   Bacteria, UA Few (A) None seen/Few  Urinalysis, Complete  Result Value Ref Range   Specific Gravity, UA 1.010 1.005 - 1.030   pH, UA 5.5 5.0 - 7.5   Color, UA Yellow Yellow   Appearance Ur Clear Clear   Leukocytes, UA Trace (A) Negative   Protein, UA 1+ (A) Negative/Trace   Glucose, UA Negative Negative   Ketones, UA Negative Negative   RBC, UA 3+ (A) Negative   Bilirubin, UA Negative Negative   Urobilinogen, Ur 0.2 0.2 - 1.0 mg/dL   Nitrite, UA Negative Negative  Microscopic Examination See below:      Pertinent Imaging:   Assessment & Plan:    1. Recurrent UTI- UA suspicious today. Treat pending culture results. I&O catheterization perfromed for specimen sent for culture. Patient may need referral to infectious disease. UTI prevention strategies discussed.  Good perineal hygiene reviewed. Patient is encouraged to increase daily water intake, start cranberry supplements to prevent invasive colonization along the urinary tract and probiotics, especially lactobacillus to restore normal vaginal flora. Patient may benefit from her suppressive and antibiotic therapy using Monurol q 10 days.  KUB and renal ultrasound ordered for evaluation of possible renal stones as nidus for recurrent infections. - Urinalysis, Complete - BLADDER SCAN AMB NON-IMAGING  Return for KUB/RUS results.  These notes generated with voice recognition software. I apologize for typographical errors.  Herbert Moors, [facility] [address] [place], [place] [address] [phone]

## 2015-03-12 LAB — CULTURE, URINE COMPREHENSIVE

## 2015-03-14 ENCOUNTER — Ambulatory Visit: Admission: RE | Admit: 2015-03-14 | Payer: Medicare Other | Source: Ambulatory Visit

## 2015-03-14 ENCOUNTER — Telehealth: Payer: Self-pay

## 2015-03-14 NOTE — Telephone Encounter (Signed)
-----   Message from Roda Shutters, [place] sent at 03/14/2015 10:10 AM EST ----- Please notify patient and family that her urine culture was negative for infection though her UA did show some red blood cells. We will speak about this further when she returns for KUB and renal ultrasound results. Thanks

## 2015-03-14 NOTE — Telephone Encounter (Signed)
Spoke with [name], pt son, in reference to ucx and u/a. [name] voiced understanding.

## 2015-03-18 ENCOUNTER — Ambulatory Visit
Admission: RE | Admit: 2015-03-18 | Discharge: 2015-03-18 | Disposition: A | Discharge status: Home / Self Care | Source: Ambulatory Visit | Attending: Obstetrics and Gynecology | Admitting: Obstetrics and Gynecology

## 2015-03-18 DIAGNOSIS — N390 Urinary tract infection, site not specified: Secondary | ICD-10-CM | POA: Diagnosis present

## 2015-03-18 DIAGNOSIS — N281 Cyst of kidney, acquired: Secondary | ICD-10-CM | POA: Diagnosis not present

## 2015-03-24 ENCOUNTER — Ambulatory Visit (INDEPENDENT_AMBULATORY_CARE_PROVIDER_SITE_OTHER): Payer: Medicare Other | Admitting: Obstetrics and Gynecology

## 2015-03-24 ENCOUNTER — Encounter: Payer: Self-pay | Admitting: Obstetrics and Gynecology

## 2015-03-24 ENCOUNTER — Ambulatory Visit: Payer: Medicare Other | Admitting: Obstetrics and Gynecology

## 2015-03-24 VITALS — BP 131/72 | HR 78 | Resp 16 | Ht 62.0 in | Wt 129.0 lb

## 2015-03-24 DIAGNOSIS — N390 Urinary tract infection, site not specified: Secondary | ICD-10-CM

## 2015-03-24 LAB — URINALYSIS, COMPLETE
Bilirubin, UA: NEGATIVE
GLUCOSE, UA: NEGATIVE
Ketones, UA: NEGATIVE
Nitrite, UA: NEGATIVE
PH UA: 5.5 (ref 5.0–7.5)
Specific Gravity, UA: 1.02 (ref 1.005–1.030)
UUROB: 0.2 mg/dL (ref 0.2–1.0)

## 2015-03-24 LAB — MICROSCOPIC EXAMINATION

## 2015-03-24 MED ORDER — FOSFOMYCIN TROMETHAMINE 3 G PO PACK
PACK | ORAL | Status: DC
Start: 2015-03-24 — End: 2015-07-18

## 2015-03-24 MED ORDER — ESTROGENS, CONJUGATED 0.625 MG/GM VA CREA: TOPICAL_CREAM | VAGINAL | Status: DC

## 2015-03-24 NOTE — Patient Instructions (Signed)
Vaginal estrogen cream-  Apply a blue berry sized amount to vaginal opening nightly for 2 weeks then 3 times weekly before bed.

## 2015-03-24 NOTE — Progress Notes (Signed)
03/24/2015 3:42 PM   Brenda Roman 1924/03/17 ER:[id number]  Referring provider: Kirk Ruths, MD [place] [facility] [place], [place] [address]  Chief Complaint  Patient presents with  . Recurrent UTI  . Results    RUS & KUB    HPI: Patient is an 82yo female with a history of CHF, s/p liver transplant, TIA, macular degeneration, blindness currently under hospice care presenting today for follow up for recurrent urinary tract infections. A catheterized urine specimen was obtained her last visit and the urine culture was negative. Renal ultrasound and KUB were ordered for evaluation of possible kidney stones as nidus for infection. KUB negative. Renal ultrasound showing a focal posterior bladder wall thickening and/or adherent debris. A follow-up scan was recommended to evaluate for resolution. Mild bilateral renal cysts were also seen.  Patient reports dysuria and cloudy urine x 1 day.   Patient has experienced at least 5 documented urinary tract infections with positive cultures over the last year. Three most recent cultures positive for increasingly resistant Klebsiella pneumoniae. She was prescribed Monurol which she completed 2 days ago. She has continued to experience some dysuria since completion.   Patient unable to quantify how much water she drinks daily stating that she "thinks it's plenty".   PMH: Past Medical History  Diagnosis Date  . Liver transplanted ([place])   . Macular degeneration   . Coronary artery disease   . Hypertension   . Shingles   . CHF (congestive heart failure) ([place])   . TIA (transient ischemic attack)     Surgical History: Past Surgical History  Procedure Laterality Date  . Coronary artery bypass graft    . Joint replacement    . Abdominal surgery    . Colonoscopy      Home Medications:    Medication List       This list is accurate as of: 03/24/15  3:42 PM.  Always use your most recent med list.               aspirin EC 81 MG tablet  Take 81 mg by mouth 2 (two) times daily.     CALCIUM CARBONATE PO  Take 1 tablet by mouth daily.     conjugated estrogens vaginal cream  Commonly known as:  PREMARIN  Apply a blue berry sized amount to vaginal opening (urethra) nightly for 2 weeks then 3 times weekly before bed.     diclofenac sodium 1 % Gel  Commonly known as:  VOLTAREN  Apply 2 g topically 2 (two) times daily.     ezetimibe-simvastatin 10-20 MG tablet  Commonly known as:  VYTORIN  Take 1 tablet by mouth at bedtime.     fosfomycin 3 G Pack  Commonly known as:  MONUROL  Take every other day for three doses.  Dissolve in 3-4 ounces of water.  HOSPICE PATIENT     LACTULOSE PO  Take 1 Dose by mouth as needed.     metoprolol succinate 50 MG 24 hr tablet  Commonly known as:  TOPROL-XL  Take 50-100 mg by mouth 2 (two) times daily. Pt takes 2 tabs (100mg ) every morning, and 1 tab (50mg ) every evening     mirtazapine 15 MG tablet  Commonly known as:  REMERON  Take 1 tablet (15 mg total) by mouth at bedtime.     mycophenolate 500 MG tablet  Commonly known as:  CELLCEPT  Take 500 mg by mouth every 12 (twelve) hours.  nitroGLYCERIN 0.4 MG SL tablet  Commonly known as:  NITROSTAT  Place 0.4 mg under the tongue every 5 (five) minutes as needed for chest pain.     predniSONE 5 MG tablet  Commonly known as:  DELTASONE  Take 5 mg by mouth daily.     rivaroxaban 20 MG Tabs tablet  Commonly known as:  XARELTO  Take 20 mg by mouth daily with supper.     senna-docusate 8.6-50 MG tablet  Commonly known as:  Senokot-S  Take 2 tablets by mouth daily.     sodium chloride 1 G tablet  Take 1 tablet (1 g total) by mouth daily.     valACYclovir 1000 MG tablet  Commonly known as:  VALTREX  Take 1,000 mg by mouth 2 (two) times daily.     Vitamin D3 1000 UNITS Caps  Take 2 capsules by mouth daily.     zolpidem 5 MG tablet  Commonly known as:  AMBIEN  Take 5 mg by mouth at bedtime as  needed for sleep.        Allergies:  Allergies  Allergen Reactions  . Cephalexin Other (See Comments)    Reaction:  Unknown  . Novocain [Procaine] Rash  . Sulfa Antibiotics Rash    Family History: No family history on file.  Social History:  reports that she has quit smoking. She does not have any smokeless tobacco history on file. She reports that she does not drink alcohol or use illicit drugs.  ROS: UROLOGY Frequent Urination?: Yes Hard to postpone urination?: No Burning/pain with urination?: Yes Get up at night to urinate?: Yes Leakage of urine?: No Urine stream starts and stops?: No Trouble starting stream?: Yes Do you have to strain to urinate?: No Blood in urine?: No Urinary tract infection?: Yes Sexually transmitted disease?: No Injury to kidneys or bladder?: No Painful intercourse?: No Weak stream?: No Currently pregnant?: No Vaginal bleeding?: No Last menstrual period?: n  Gastrointestinal Nausea?: No Vomiting?: No Indigestion/heartburn?: No Diarrhea?: Yes Constipation?: Yes  Constitutional Fever: No Night sweats?: No Weight loss?: No Fatigue?: Yes  Skin Skin rash/lesions?: No Itching?: No  Eyes Blurred vision?: No Double vision?: No  Ears/Nose/Throat Sore throat?: No Sinus problems?: No  Hematologic/Lymphatic Swollen glands?: No Easy bruising?: No  Cardiovascular Leg swelling?: No Chest pain?: No  Respiratory Cough?: No Shortness of breath?: No  Endocrine Excessive thirst?: No  Musculoskeletal Back pain?: No Joint pain?: No  Neurological Headaches?: No Dizziness?: No  Psychologic Depression?: No Anxiety?: No  Physical Exam: BP 131/72 mmHg  Pulse 78  Resp 16  Ht 5\' 2"  (1.575 m)  Wt 129 lb (58.514 kg)  BMI 23.59 kg/m2  Constitutional:  Alert and oriented, No acute distress, wheelchair bound, hearing impaired HEENT: [place] AT, moist mucus membranes.  Trachea midline, no masses. Cardiovascular: No clubbing,  cyanosis, or edema. Respiratory: Normal respiratory effort, no increased work of breathing. GI: Abdomen is soft, nontender, nondistended, no abdominal masses GU: No CVA tenderness. Skin: No rashes, bruises or suspicious lesions. Lymph: No cervical or inguinal adenopathy. Neurologic: Grossly intact, no focal deficits, moving upper extremities. Psychiatric: Normal mood and affect.  Laboratory Data:   Urinalysis Results for orders placed or performed in visit on 03/24/15  CULTURE, URINE COMPREHENSIVE  Result Value Ref Range   Urine Culture, Comprehensive Final report    Result 1 Comment   Microscopic Examination  Result Value Ref Range   WBC, UA >30 (H) 0 -  5 /hpf   RBC, UA  0-2 0 -  2 /hpf   Epithelial Cells (non renal) 0-10 0 - 10 /hpf   Renal Epithel, UA 0-10 (A) None seen /hpf   Bacteria, UA Many (A) None seen/Few  Urinalysis, Complete  Result Value Ref Range   Specific Gravity, UA 1.020 1.005 - 1.030   pH, UA 5.5 5.0 - 7.5   Color, UA Yellow Yellow   Appearance Ur Clear Clear   Leukocytes, UA 3+ (A) Negative   Protein, UA 2+ (A) Negative/Trace   Glucose, UA Negative Negative   Ketones, UA Negative Negative   RBC, UA 1+ (A) Negative   Bilirubin, UA Negative Negative   Urobilinogen, Ur 0.2 0.2 - 1.0 mg/dL   Nitrite, UA Negative Negative   Microscopic Examination See below:     Pertinent Imaging: CLINICAL DATA: Subsequent encounter for recurring UTI. EXAM: ABDOMEN - 1 VIEW COMPARISON: 08/25/2014.  FINDINGS: Diffuse gas-filled large and small bowel is evident without obstructive pattern. Surgical clips in the right upper quadrant suggest prior cholecystectomy. Surgical clips in the region of the esophagogastric junction may be from prior fundoplication. Surgical clips are also noted in the left groin area. Patient is status post ORIF for right femoral neck fracture. Bones are diffusely demineralized. No evidence for urinary stones.  IMPRESSION: Stable. No  acute abnormality. Electronically Signed  By: Misty Stanley M.D.  On: 03/18/2015 14:47  CLINICAL DATA: Recurrent UTI. EXAM: RENAL / URINARY TRACT ULTRASOUND COMPLETE COMPARISON: None. FINDINGS: Right Kidney: Length: 8.4 cm. Echogenicity within normal limits. No hydronephrosis visualized. 1.7 cm and adjacent 2.3 cm simple cyst midportion right kidney. Left Kidney: Length: 8.2 cm. Echogenicity within normal limits. No hydronephrosis visualized. 3.0 cm cyst upper pole left kidney. Bladder: Focal posterior bladder wall thickening and/or adherent debris. Follow-up scan can be obtained to evaluate for resolution following clearing of patient's UTI. IMPRESSION: 1. Focal posterior bladder wall thickening and/or adherent debris. Follow-up scan can be obtained to evaluate for resolution following clearing of patient's UTI. 2. Simple bilateral renal cysts.  Assessment & Plan:    1. Recurrent UTI-  Patient with dysuria beginning 1 day ago. UA highly suspicious for infection. Catheterized specimen sent for culture. Will treat with Monurol pending culture results because of upcoming weekend. Will refer to infectious disease pending urine culture results.   2. Vaginal Atrophy-  Start vaginal estrogen cream.  Prescription sent to pharmacy.  There are no diagnoses linked to this encounter.  No Follow-up on file.  These notes generated with voice recognition software. I apologize for typographical errors.  Herbert Moors, [facility] [address], [place], [place] [address] [phone]

## 2015-03-25 NOTE — Progress Notes (Signed)
In and Out Catheterization  Patient is present today for a I & O catheterization due to needing a clean catch specimen. Patient was cleaned and prepped in a sterile fashion with betadine and Lidocaine 2% jelly was instilled into the urethra.  A 14FR cath was inserted no complications were noted , 84ml of urine return was noted, urine was cloudy yellow in color. A clean urine sample was collected for culture. Bladder was drained  And catheter was removed with out difficulty.    Preformed by: Larna Daughters

## 2015-03-26 LAB — CULTURE, URINE COMPREHENSIVE

## 2015-03-28 ENCOUNTER — Telehealth: Payer: Self-pay

## 2015-03-28 NOTE — Telephone Encounter (Signed)
-----   Message from Roda Shutters, [place] sent at 03/28/2015  7:58 AM EST ----- Please notify patient and her husband that her urine culture did grow out some bacteria but there were no predominant organisms so antibiotics susceptibility was not performed. If she is feeling better on the fosfomycin I would like her to complete the antibiotics and then return to our office in 3-5 days after completion for a catheterized urine specimen please. If she is not feeling better I would like her to come in sooner to provide a catheter specimen to be sent again for culture. We need to have a catheterized urine culture prior to being able to send her to see infectious disease. Thanks

## 2015-03-28 NOTE — Telephone Encounter (Signed)
Spoke with pt husband in reference to pt ucx. Pt stated that she is feeling some better. Due to pt other appts pt will need to keep 04/07/15 appt and have cath specimen done then.

## 2015-04-07 ENCOUNTER — Ambulatory Visit (INDEPENDENT_AMBULATORY_CARE_PROVIDER_SITE_OTHER): Payer: Medicare Other | Admitting: Obstetrics and Gynecology

## 2015-04-07 ENCOUNTER — Encounter: Payer: Self-pay | Admitting: Obstetrics and Gynecology

## 2015-04-07 VITALS — BP 110/64 | HR 80 | Resp 16 | Ht 62.0 in

## 2015-04-07 DIAGNOSIS — N390 Urinary tract infection, site not specified: Secondary | ICD-10-CM

## 2015-04-07 DIAGNOSIS — N952 Postmenopausal atrophic vaginitis: Secondary | ICD-10-CM

## 2015-04-07 LAB — MICROSCOPIC EXAMINATION: RBC, UA: NONE SEEN /hpf (ref 0–?)

## 2015-04-07 LAB — URINALYSIS, COMPLETE
BILIRUBIN UA: NEGATIVE
GLUCOSE, UA: NEGATIVE
Ketones, UA: NEGATIVE
Nitrite, UA: NEGATIVE
SPEC GRAV UA: 1.02 (ref 1.005–1.030)
UUROB: 0.2 mg/dL (ref 0.2–1.0)
pH, UA: 5 (ref 5.0–7.5)

## 2015-04-07 MED ORDER — NITROFURANTOIN MONOHYD MACRO 100 MG PO CAPS: 100.0000 mg | ORAL_CAPSULE | Freq: Every day | ORAL | Status: DC

## 2015-04-07 NOTE — Progress Notes (Signed)
04/07/2015 10:17 AM   Brenda Roman Sabra Heck 12-08-1923 ER:[id number]  Referring Cherisa Brucker: Kirk Ruths, MD [place], [place] [address]  Chief Complaint  Patient presents with  . Recurrent UTI    HPI: Patient is an 81yo female with a history of CHF, s/p liver transplant, TIA, macular degeneration, blindness currently under hospice care presenting today for follow up for recurrent urinary tract infections.   She was most recently treated with fosfomycin. Last urine culture showed more than 3 organisms greater than 100,000 colonies with none predominant.  Patient feeling better.  No urinary symptoms no fevers or flank pain. He has also been using her vaginal estrogen cream which she reports as significantly improved her vaginal burning and dysuria.     PMH: Past Medical History  Diagnosis Date  . Liver transplanted ([place])   . Macular degeneration   . Coronary artery disease   . Hypertension   . Shingles   . CHF (congestive heart failure) ([place])   . TIA (transient ischemic attack)     Surgical History: Past Surgical History  Procedure Laterality Date  . Coronary artery bypass graft    . Joint replacement    . Abdominal surgery    . Colonoscopy      Home Medications:    Medication List       This list is accurate as of: 04/07/15 11:59 PM.  Always use your most recent med list.               aspirin EC 81 MG tablet  Take 81 mg by mouth 2 (two) times daily.     CALCIUM CARBONATE PO  Take 1 tablet by mouth daily.     conjugated estrogens vaginal cream  Commonly known as:  PREMARIN  Apply a blue berry sized amount to vaginal opening (urethra) nightly for 2 weeks then 3 times weekly before bed.     diclofenac sodium 1 % Gel  Commonly known as:  VOLTAREN  Apply 2 g topically 2 (two) times daily.     ezetimibe-simvastatin 10-20 MG tablet  Commonly known as:  VYTORIN  Take 1 tablet by mouth at bedtime.     fosfomycin 3 G  Pack  Commonly known as:  MONUROL  Take every other day for three doses.  Dissolve in 3-4 ounces of water.  HOSPICE PATIENT     gabapentin 100 MG capsule  Commonly known as:  NEURONTIN  Take by mouth.     LACTULOSE PO  Take 1 Dose by mouth as needed.     metoprolol succinate 50 MG 24 hr tablet  Commonly known as:  TOPROL-XL  Take 50-100 mg by mouth 2 (two) times daily. Pt takes 2 tabs (100mg ) every morning, and 1 tab (50mg ) every evening     mirtazapine 15 MG tablet  Commonly known as:  REMERON  Take 1 tablet (15 mg total) by mouth at bedtime.     mycophenolate 500 MG tablet  Commonly known as:  CELLCEPT  Take 500 mg by mouth every 12 (twelve) hours.     nitrofurantoin (macrocrystal-monohydrate) 100 MG capsule  Commonly known as:  MACROBID  Take 1 capsule (100 mg total) by mouth daily.     nitroGLYCERIN 0.4 MG SL tablet  Commonly known as:  NITROSTAT  Place 0.4 mg under the tongue every 5 (five) minutes as needed for chest pain.     predniSONE 5 MG tablet  Commonly known as:  DELTASONE  Take 5 mg by mouth daily.     rivaroxaban 20 MG Tabs tablet  Commonly known as:  XARELTO  Take 20 mg by mouth daily with supper.     senna-docusate 8.6-50 MG tablet  Commonly known as:  Senokot-S  Take 2 tablets by mouth daily.     sodium chloride 1 G tablet  Take 1 tablet (1 g total) by mouth daily.     valACYclovir 1000 MG tablet  Commonly known as:  VALTREX  Take 1,000 mg by mouth 2 (two) times daily.     Vitamin D3 1000 UNITS Caps  Take 2 capsules by mouth daily.     zolpidem 5 MG tablet  Commonly known as:  AMBIEN  Take 5 mg by mouth at bedtime as needed for sleep.        Allergies:  Allergies  Allergen Reactions  . Cephalexin Other (See Comments)    Reaction:  Unknown  . Novocain [Procaine] Rash  . Sulfa Antibiotics Rash    Family History: No family history on file.  Social History:  reports that she has quit smoking. She does not have any smokeless tobacco  history on file. She reports that she does not drink alcohol or use illicit drugs.  ROS:                                        Physical Exam: BP 110/64 mmHg  Pulse 80  Resp 16  Ht 5\' 2"  (1.575 m)  Constitutional:  Alert and oriented, No acute distress. HEENT: [place] AT, moist mucus membranes.  Trachea midline, no masses. Cardiovascular: No clubbing, cyanosis, or edema. Respiratory: Normal respiratory effort, no increased work of breathing. Skin: No rashes, bruises or suspicious lesions. Neurologic: Grossly intact, no focal deficits, moving all 4 extremities. Psychiatric: Normal mood and affect.  Laboratory Data:   Urinalysis Results for orders placed or performed in visit on 04/07/15  Microscopic Examination  Result Value Ref Range   WBC, UA >30 (H) 0 -  5 /hpf   RBC, UA None seen 0 -  2 /hpf   Epithelial Cells (non renal) 0-10 0 - 10 /hpf   Mucus, UA Present (A) Not Estab.   Bacteria, UA Many (A) None seen/Few  Urinalysis, Complete  Result Value Ref Range   Specific Gravity, UA 1.020 1.005 - 1.030   pH, UA 5.0 5.0 - 7.5   Color, UA Yellow Yellow   Appearance Ur Cloudy (A) Clear   Leukocytes, UA 1+ (A) Negative   Protein, UA Trace (A) Negative/Trace   Glucose, UA Negative Negative   Ketones, UA Negative Negative   RBC, UA 2+ (A) Negative   Bilirubin, UA Negative Negative   Urobilinogen, Ur 0.2 0.2 - 1.0 mg/dL   Nitrite, UA Negative Negative   Microscopic Examination See below:     Pertinent Imaging:   Assessment & Plan:    1. Recurrent UTI- Patient reports significant improvement in overall feeling of wellness as well as urinary symptoms. She states that she believes that the antibiotic resolved her previous infection.  Patient's UA remains suspicious for infection versus vaginal contamination. Since patient is clinically improved I do not feel that a catheterized specimen is necessary today. I discussed this with patient and her husband and  they feel comfortable returning for a catheterized specimen if her symptoms return. She will continue her cranberry supplements twice daily and  vaginal estrogen replacement. I will start her on once daily Macrobid suppressive therapy and reassess her symptoms in 3 months.  -Previous RUS showing bladder debris.  Will repeat RUS at follow up visit to confirm resolution.  2. Vaginal Atrophy- Patient reports significant improvement in vaginal burning and dysuria. We will continue vaginal estrogen cream.   There are no diagnoses linked to this encounter.  Return in about 3 months (around 07/06/2015), or if symptoms worsen or fail to improve.  These notes generated with voice recognition software. I apologize for typographical errors.  Herbert Moors, [facility] [address], [place], [place] [address] [phone]

## 2015-06-20 DIAGNOSIS — I050 Rheumatic mitral stenosis: Secondary | ICD-10-CM | POA: Insufficient documentation

## 2015-07-07 ENCOUNTER — Ambulatory Visit: Payer: Medicare Other | Admitting: Obstetrics and Gynecology

## 2015-07-11 ENCOUNTER — Ambulatory Visit: Payer: Medicare Other | Admitting: Urology

## 2015-07-18 ENCOUNTER — Emergency Department: Payer: Medicare Other

## 2015-07-18 ENCOUNTER — Inpatient Hospital Stay
Admission: EM | Admit: 2015-07-18 | Discharge: 2015-07-20 | DRG: 304 | Disposition: A | Discharge status: Home Health | Payer: Medicare Other | Attending: Internal Medicine | Admitting: Internal Medicine

## 2015-07-18 DIAGNOSIS — Z881 Allergy status to other antibiotic agents status: Secondary | ICD-10-CM | POA: Diagnosis not present

## 2015-07-18 DIAGNOSIS — I248 Other forms of acute ischemic heart disease: Secondary | ICD-10-CM | POA: Diagnosis present

## 2015-07-18 DIAGNOSIS — Z87891 Personal history of nicotine dependence: Secondary | ICD-10-CM

## 2015-07-18 DIAGNOSIS — Z951 Presence of aortocoronary bypass graft: Secondary | ICD-10-CM | POA: Diagnosis not present

## 2015-07-18 DIAGNOSIS — R0902 Hypoxemia: Secondary | ICD-10-CM | POA: Diagnosis present

## 2015-07-18 DIAGNOSIS — H3530 Unspecified macular degeneration: Secondary | ICD-10-CM | POA: Diagnosis present

## 2015-07-18 DIAGNOSIS — E785 Hyperlipidemia, unspecified: Secondary | ICD-10-CM | POA: Diagnosis present

## 2015-07-18 DIAGNOSIS — Z8673 Personal history of transient ischemic attack (TIA), and cerebral infarction without residual deficits: Secondary | ICD-10-CM | POA: Diagnosis not present

## 2015-07-18 DIAGNOSIS — Z944 Liver transplant status: Secondary | ICD-10-CM | POA: Diagnosis not present

## 2015-07-18 DIAGNOSIS — Z79899 Other long term (current) drug therapy: Secondary | ICD-10-CM

## 2015-07-18 DIAGNOSIS — I161 Hypertensive emergency: Principal | ICD-10-CM | POA: Diagnosis present

## 2015-07-18 DIAGNOSIS — Z7952 Long term (current) use of systemic steroids: Secondary | ICD-10-CM | POA: Diagnosis not present

## 2015-07-18 DIAGNOSIS — I110 Hypertensive heart disease with heart failure: Secondary | ICD-10-CM | POA: Diagnosis present

## 2015-07-18 DIAGNOSIS — Z66 Do not resuscitate: Secondary | ICD-10-CM | POA: Diagnosis present

## 2015-07-18 DIAGNOSIS — D649 Anemia, unspecified: Secondary | ICD-10-CM | POA: Diagnosis present

## 2015-07-18 DIAGNOSIS — I509 Heart failure, unspecified: Secondary | ICD-10-CM

## 2015-07-18 DIAGNOSIS — I5031 Acute diastolic (congestive) heart failure: Secondary | ICD-10-CM | POA: Diagnosis present

## 2015-07-18 DIAGNOSIS — I080 Rheumatic disorders of both mitral and aortic valves: Secondary | ICD-10-CM | POA: Diagnosis present

## 2015-07-18 DIAGNOSIS — I2510 Atherosclerotic heart disease of native coronary artery without angina pectoris: Secondary | ICD-10-CM | POA: Diagnosis present

## 2015-07-18 DIAGNOSIS — Z7982 Long term (current) use of aspirin: Secondary | ICD-10-CM

## 2015-07-18 DIAGNOSIS — Z7901 Long term (current) use of anticoagulants: Secondary | ICD-10-CM

## 2015-07-18 DIAGNOSIS — Z882 Allergy status to sulfonamides status: Secondary | ICD-10-CM

## 2015-07-18 DIAGNOSIS — I4891 Unspecified atrial fibrillation: Secondary | ICD-10-CM | POA: Diagnosis present

## 2015-07-18 HISTORY — DX: Rheumatic mitral valve disease, unspecified: I05.9

## 2015-07-18 HISTORY — DX: Nonrheumatic aortic (valve) stenosis: I35.0

## 2015-07-18 HISTORY — DX: Unspecified visual loss: H54.7

## 2015-07-18 LAB — CBC WITH DIFFERENTIAL/PLATELET
Basophils Absolute: 0 10*3/uL (ref 0–0.1)
Basophils Relative: 0 %
Eosinophils Absolute: 0 10*3/uL (ref 0–0.7)
Eosinophils Relative: 1 %
HCT: 33.9 % — ABNORMAL LOW (ref 35.0–47.0)
Hemoglobin: 10.5 g/dL — ABNORMAL LOW (ref 12.0–16.0)
Lymphocytes Relative: 17 %
Lymphs Abs: 0.9 10*3/uL — ABNORMAL LOW (ref 1.0–3.6)
MCH: 26.8 pg (ref 26.0–34.0)
MCHC: 31.1 g/dL — ABNORMAL LOW (ref 32.0–36.0)
MCV: 86.3 fL (ref 80.0–100.0)
Monocytes Absolute: 0.4 10*3/uL (ref 0.2–0.9)
Monocytes Relative: 7 %
Neutro Abs: 4.1 10*3/uL (ref 1.4–6.5)
Neutrophils Relative %: 75 %
Platelets: 287 10*3/uL (ref 150–440)
RBC: 3.93 MIL/uL (ref 3.80–5.20)
RDW: 17.1 % — ABNORMAL HIGH (ref 11.5–14.5)
WBC: 5.4 10*3/uL (ref 3.6–11.0)

## 2015-07-18 LAB — COMPREHENSIVE METABOLIC PANEL
ALK PHOS: 78 U/L (ref 38–126)
ALT: 17 U/L (ref 14–54)
AST: 26 U/L (ref 15–41)
Albumin: 3.9 g/dL (ref 3.5–5.0)
Anion gap: 7 (ref 5–15)
BUN: 12 mg/dL (ref 6–20)
CALCIUM: 9 mg/dL (ref 8.9–10.3)
CO2: 23 mmol/L (ref 22–32)
CREATININE: 0.66 mg/dL (ref 0.44–1.00)
Chloride: 104 mmol/L (ref 101–111)
Glucose, Bld: 107 mg/dL — ABNORMAL HIGH (ref 65–99)
Potassium: 4.1 mmol/L (ref 3.5–5.1)
Sodium: 134 mmol/L — ABNORMAL LOW (ref 135–145)
Total Bilirubin: 0.5 mg/dL (ref 0.3–1.2)
Total Protein: 7.1 g/dL (ref 6.5–8.1)

## 2015-07-18 LAB — TROPONIN I
TROPONIN I: 0.07 ng/mL — AB (ref <0.031)
Troponin I: 0.46 ng/mL — ABNORMAL HIGH (ref <0.031)

## 2015-07-18 LAB — TSH: TSH: 3.339 u[IU]/mL (ref 0.350–4.500)

## 2015-07-18 MED ORDER — EZETIMIBE-SIMVASTATIN 10-20 MG PO TABS: 1.0000 | ORAL_TABLET | Freq: Every day | ORAL | Status: DC

## 2015-07-18 MED ORDER — FUROSEMIDE 10 MG/ML IJ SOLN
20.0000 mg | Freq: Two times a day (BID) | INTRAMUSCULAR | Status: DC
  Administered 2015-07-19: 20 mg via INTRAVENOUS
  Filled 2015-07-18 (×2): qty 2

## 2015-07-18 MED ORDER — EZETIMIBE 10 MG PO TABS
10.0000 mg | ORAL_TABLET | Freq: Every day | ORAL | Status: DC
  Administered 2015-07-19: 10 mg via ORAL
  Filled 2015-07-18: qty 1

## 2015-07-18 MED ORDER — ASPIRIN EC 81 MG PO TBEC
81.0000 mg | DELAYED_RELEASE_TABLET | Freq: Every day | ORAL | Status: DC
  Administered 2015-07-19 – 2015-07-20 (×2): 81 mg via ORAL
  Filled 2015-07-18 (×2): qty 1

## 2015-07-18 MED ORDER — SIMVASTATIN 20 MG PO TABS
20.0000 mg | ORAL_TABLET | Freq: Every day | ORAL | Status: DC
  Administered 2015-07-19: 20 mg via ORAL
  Filled 2015-07-18: qty 1

## 2015-07-18 MED ORDER — MIRTAZAPINE 15 MG PO TABS
15.0000 mg | ORAL_TABLET | Freq: Every day | ORAL | Status: DC
  Administered 2015-07-19: 15 mg via ORAL
  Filled 2015-07-18 (×2): qty 1

## 2015-07-18 MED ORDER — HYDRALAZINE HCL 20 MG/ML IJ SOLN
10.0000 mg | Freq: Four times a day (QID) | INTRAMUSCULAR | Status: DC | PRN
  Filled 2015-07-18: qty 1

## 2015-07-18 MED ORDER — GABAPENTIN 100 MG PO CAPS
100.0000 mg | ORAL_CAPSULE | Freq: Two times a day (BID) | ORAL | Status: DC
  Administered 2015-07-18 – 2015-07-20 (×4): 100 mg via ORAL
  Filled 2015-07-18 (×4): qty 1

## 2015-07-18 MED ORDER — NITROFURANTOIN MONOHYD MACRO 100 MG PO CAPS
100.0000 mg | ORAL_CAPSULE | Freq: Every day | ORAL | Status: DC
  Administered 2015-07-18 – 2015-07-20 (×3): 100 mg via ORAL
  Filled 2015-07-18 (×3): qty 1

## 2015-07-18 MED ORDER — PREDNISONE 10 MG PO TABS
5.0000 mg | ORAL_TABLET | Freq: Every day | ORAL | Status: DC
  Administered 2015-07-19 – 2015-07-20 (×2): 5 mg via ORAL
  Filled 2015-07-18 (×2): qty 1

## 2015-07-18 MED ORDER — SENNOSIDES-DOCUSATE SODIUM 8.6-50 MG PO TABS
2.0000 | ORAL_TABLET | Freq: Every day | ORAL | Status: DC
  Administered 2015-07-18: 2 via ORAL
  Filled 2015-07-18 (×2): qty 2

## 2015-07-18 MED ORDER — SODIUM CHLORIDE 0.9 % IV SOLN: 250.0000 mL | INTRAVENOUS | Status: DC | PRN

## 2015-07-18 MED ORDER — VALACYCLOVIR HCL 500 MG PO TABS
1000.0000 mg | ORAL_TABLET | Freq: Every day | ORAL | Status: DC
  Administered 2015-07-19 – 2015-07-20 (×2): 1000 mg via ORAL
  Filled 2015-07-18 (×2): qty 2

## 2015-07-18 MED ORDER — NITROGLYCERIN 0.4 MG SL SUBL: 0.4000 mg | SUBLINGUAL_TABLET | SUBLINGUAL | Status: DC | PRN

## 2015-07-18 MED ORDER — ONDANSETRON HCL 4 MG/2ML IJ SOLN: 4.0000 mg | Freq: Four times a day (QID) | INTRAMUSCULAR | Status: DC | PRN

## 2015-07-18 MED ORDER — NITROGLYCERIN 2 % TD OINT
0.5000 [in_us] | TOPICAL_OINTMENT | Freq: Four times a day (QID) | TRANSDERMAL | Status: DC
  Administered 2015-07-18 – 2015-07-19 (×3): 0.5 [in_us] via TOPICAL
  Filled 2015-07-18 (×3): qty 1

## 2015-07-18 MED ORDER — HYDROCODONE-ACETAMINOPHEN 5-325 MG PO TABS
1.0000 | ORAL_TABLET | Freq: Four times a day (QID) | ORAL | Status: DC | PRN
  Administered 2015-07-18 – 2015-07-19 (×2): 1 via ORAL
  Filled 2015-07-18 (×2): qty 1

## 2015-07-18 MED ORDER — TRAZODONE HCL 50 MG PO TABS
150.0000 mg | ORAL_TABLET | Freq: Every day | ORAL | Status: DC
  Administered 2015-07-18 – 2015-07-19 (×2): 150 mg via ORAL
  Filled 2015-07-18 (×2): qty 3

## 2015-07-18 MED ORDER — FUROSEMIDE 10 MG/ML IJ SOLN
40.0000 mg | Freq: Once | INTRAMUSCULAR | Status: AC
  Administered 2015-07-18: 40 mg via INTRAVENOUS
  Filled 2015-07-18: qty 4

## 2015-07-18 MED ORDER — SODIUM CHLORIDE 0.9% FLUSH
3.0000 mL | Freq: Two times a day (BID) | INTRAVENOUS | Status: DC
  Administered 2015-07-18: 3 mL via INTRAVENOUS

## 2015-07-18 MED ORDER — ACETAMINOPHEN 650 MG RE SUPP: 650.0000 mg | Freq: Four times a day (QID) | RECTAL | Status: DC | PRN

## 2015-07-18 MED ORDER — SODIUM CHLORIDE 0.9% FLUSH
3.0000 mL | Freq: Two times a day (BID) | INTRAVENOUS | Status: DC
  Administered 2015-07-19 – 2015-07-20 (×3): 3 mL via INTRAVENOUS

## 2015-07-18 MED ORDER — ONDANSETRON HCL 4 MG PO TABS: 4.0000 mg | ORAL_TABLET | Freq: Four times a day (QID) | ORAL | Status: DC | PRN

## 2015-07-18 MED ORDER — ACETAMINOPHEN 325 MG PO TABS
650.0000 mg | ORAL_TABLET | Freq: Four times a day (QID) | ORAL | Status: DC | PRN
  Administered 2015-07-18 – 2015-07-19 (×2): 650 mg via ORAL
  Filled 2015-07-18 (×3): qty 2

## 2015-07-18 MED ORDER — SODIUM CHLORIDE 0.9% FLUSH: 3.0000 mL | INTRAVENOUS | Status: DC | PRN

## 2015-07-18 MED ORDER — RIVAROXABAN 20 MG PO TABS: 20.0000 mg | ORAL_TABLET | Freq: Every day | ORAL | Status: DC

## 2015-07-18 MED ORDER — MYCOPHENOLATE MOFETIL 250 MG PO CAPS
500.0000 mg | ORAL_CAPSULE | Freq: Two times a day (BID) | ORAL | Status: DC
  Administered 2015-07-18 – 2015-07-20 (×4): 500 mg via ORAL
  Filled 2015-07-18 (×4): qty 2

## 2015-07-18 NOTE — ED Notes (Signed)
Critical troponin 0.07 reported, MD made aware

## 2015-07-18 NOTE — ED Notes (Signed)
Pt presents to ED from home via EMS c/o tachycardia. Per EMS pt was at home and "pt went out of it" so the family checked her vitals, and called because pt HR was 130. EMS reports pt c/o headache and EKG showed sinus tach with left bundle branch with peaked t-waves. EMS gave approximately 100 ml of NS. Pt alert and oriented, tachypenic, skin color WNL, pt denies pain but reports "not feeling right", in no acute distress at this time.

## 2015-07-18 NOTE — Progress Notes (Signed)
Patient is complaining of an unrelieved headache, notified MD. Orders received.

## 2015-07-18 NOTE — ED Provider Notes (Signed)
[facility] Emergency Department Provider Note  ____________________________________________  Time seen: Seen upon arrival to the emergency department  I have reviewed the triage vital signs and the nursing notes.   HISTORY  Chief Complaint Tachycardia    HPI Brenda Roman is a 82 y.o. female with a history of liver transplant and CHF was presented to emergency department with CHF and tachycardia. She says that prior to arrival she was feeling short of breath. Denies any chest pain. Says that her heart was racing and this was the main reason why she called the ambulance to be seen in the emergency department. Her husband is at the bedside and says that she did complain of some mild left-sided chest pain. However, she is pain-free at this time. Patient says that the episodes have been on and off throughout the day with shortness of breath and tachycardia. No provocative factors including exertion. Says that she is feeling much better at this time. Per EMS, she was found to be in the 0000000 systolic in route. Patient says that her blood pressure is normally not this high. Admits compliance with her medications.   Past Medical History  Diagnosis Date  . Liver transplanted ([place])   . Macular degeneration   . Coronary artery disease   . Hypertension   . Shingles   . CHF (congestive heart failure) ([place])   . TIA (transient ischemic attack)   . Blind     Patient Active Problem List   Diagnosis Date Noted  . 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 Closed wedge fracture of lumbar vertebra ([place]) 10/11/2014  . Ischemic stroke ([place]) 10/07/2014  . Acute urinary tract infection 10/07/2014  . 2Nd nerve palsy 10/07/2014  . Closed fracture of humerus, surgical neck 09/29/2014  . Goals of care, counseling/discussion   . Acute encephalopathy 08/29/2014  . Hypoxia 08/29/2014  . Healthcare-associated pneumonia 08/29/2014  . Pneumonia 08/29/2014  . CVA (cerebral  infarction) 08/23/2014  . Arteriosclerosis of coronary artery 03/15/2014  . Paroxysmal atrial fibrillation ([place]) 03/15/2014  . Atherosclerosis of abdominal aorta ([place]) 01/27/2014  . HLD (hyperlipidemia) 04/07/2012  . BP (high blood pressure) 04/07/2012  . History of liver transplant ([place]) 04/03/2012  . Encounter for other procedures for purposes other than remedying health state 04/03/2012  . Prolapse of urethra 03/10/2012  . Pelvic relaxation due to uterovaginal prolapse 03/10/2012    Past Surgical History  Procedure Laterality Date  . Coronary artery bypass graft    . Joint replacement    . Abdominal surgery    . Colonoscopy      Current Outpatient Rx  Name  Route  Sig  Dispense  Refill  . aspirin EC 81 MG tablet   Oral   Take 81 mg by mouth daily.         . Cranberry 250 MG TABS   Oral   Take 250 mg by mouth 2 (two) times daily.         Marland Kitchen ezetimibe-simvastatin (VYTORIN) 10-20 MG tablet   Oral   Take 1 tablet by mouth daily. Patient takes Monday, Wednesday, and Friday         . gabapentin (NEURONTIN) 100 MG capsule   Oral   Take 100 mg by mouth 2 (two) times daily.          . mirtazapine (REMERON) 15 MG tablet   Oral   Take 1 tablet (15 mg total) by mouth at bedtime.   30 tablet   1   .  mycophenolate (CELLCEPT) 500 MG tablet   Oral   Take 500 mg by mouth every 12 (twelve) hours.         . nitrofurantoin, macrocrystal-monohydrate, (MACROBID) 100 MG capsule   Oral   Take 1 capsule (100 mg total) by mouth daily.   30 capsule   3   . nitroGLYCERIN (NITROSTAT) 0.4 MG SL tablet   Sublingual   Place 0.4 mg under the tongue every 5 (five) minutes as needed for chest pain.         . predniSONE (DELTASONE) 5 MG tablet   Oral   Take 5 mg by mouth daily.         . rivaroxaban (XARELTO) 20 MG TABS tablet   Oral   Take 20 mg by mouth daily with supper.         . senna-docusate (SENOKOT-S) 8.6-50 MG per tablet   Oral   Take 2 tablets by mouth  daily.         . traZODone (DESYREL) 100 MG tablet   Oral   Take 150 mg by mouth at bedtime.         . valACYclovir (VALTREX) 1000 MG tablet   Oral   Take 1,000 mg by mouth daily.            Allergies Cephalexin; Novocain; and Sulfa antibiotics  History reviewed. No pertinent family history.  Social History Social History  Substance Use Topics  . Smoking status: Former Research scientist (life sciences)  . Smokeless tobacco: None  . Alcohol Use: No    Review of Systems Constitutional: No fever/chills Eyes: Patient is blind at baseline. ENT: No sore throat. Cardiovascular: As above Respiratory: As above Gastrointestinal: No abdominal pain.  No nausea, no vomiting.  No diarrhea.  No constipation. Genitourinary: Negative for dysuria. Musculoskeletal: Negative for back pain. Skin: Negative for rash. Neurological: Negative for headaches, focal weakness or numbness.  10-point ROS otherwise negative.  ____________________________________________   PHYSICAL EXAM:  VITAL SIGNS: ED Triage Vitals  Enc Vitals Group     BP 07/18/15 1735 201/96 mmHg     Pulse Rate 07/18/15 1735 94     Resp 07/18/15 1735 19     Temp 07/18/15 1735 97.7 F (36.5 C)     Temp Source 07/18/15 1735 Oral     SpO2 07/18/15 1724 96 %     Weight 07/18/15 1735 120 lb (54.432 kg)     Height 07/18/15 1735 5\' 5"  (1.651 m)     Head Cir --      Peak Flow --      Pain Score --      Pain Loc --      Pain Edu? --      Excl. in [place]? --     Constitutional: Alert and oriented. Well appearing and in no acute distress. Eyes: Conjunctivae are normal. PERRL. EOMI. Head: Atraumatic. Nose: No congestion/rhinnorhea. Mouth/Throat: Mucous membranes are moist.  Oropharynx non-erythematous. Neck: No stridor.   Cardiovascular: Normal rate, regular rhythm. Grossly normal heart sounds.  Good peripheral circulation. Respiratory: Normal respiratory effort.  No retractions. Lungs CTAB. Gastrointestinal: Soft and nontender. No distention.  No abdominal bruits. No CVA tenderness. Musculoskeletal: No lower extremity tenderness nor edema.  No joint effusions. Neurologic:  Normal speech and language.  Skin:  Skin is warm, dry and intact. No rash noted. Psychiatric: Mood and affect are normal. Speech and behavior are normal.  ____________________________________________   LABS (all labs ordered are listed, but only abnormal results are  displayed)  Labs Reviewed  CBC WITH DIFFERENTIAL/PLATELET - Abnormal; Notable for the following:    Hemoglobin 10.5 (*)    HCT 33.9 (*)    MCHC 31.1 (*)    RDW 17.1 (*)    Lymphs Abs 0.9 (*)    All other components within normal limits  COMPREHENSIVE METABOLIC PANEL  TROPONIN I  MYCOPHENOLIC ACID (CELLCEPT)   ____________________________________________  EKG  ED ECG REPORT I, Doran Stabler, the attending physician, personally viewed and interpreted this ECG.   Date: 07/18/2015  EKG Time: 1735  Rate: 94  Rhythm: normal sinus rhythm  Axis: Normal  Intervals:left bundle branch block  ST&T Change: T wave inversions in 1 and aVL. Also T wave inversion in V6. Left bundle branch block as shown on the earlier EKGs.  ____________________________________________  RADIOLOGY   DG Chest 2 View (Final result) Result time: 07/18/15 18:12:55   Final result by Rad Results In Interface (07/18/15 18:12:55)   Narrative:   CLINICAL DATA: Shortness of breath, tachycardia.  EXAM: CHEST 2 VIEW  COMPARISON: Sep 01, 2014.  FINDINGS: Stable cardiomediastinal silhouette. Status post coronary artery bypass graft. No pneumothorax is noted. Minimal bilateral pleural effusions are noted. Atherosclerosis of descending thoracic aorta is noted. Increased interstitial densities are noted throughout both lungs concerning for edema. No significant osseous abnormalities noted.  IMPRESSION: Increased interstitial densities are noted bilaterally, left greater than right, concerning for  pulmonary edema. Minimal bilateral pleural effusions.   Electronically Signed By: Marijo Conception, M.D. On: 07/18/2015 18:12       ____________________________________________   PROCEDURES   ____________________________________________   INITIAL IMPRESSION / ASSESSMENT AND PLAN / ED COURSE  Pertinent labs & imaging results that were available during my care of the patient were reviewed by me and considered in my medical decision making (see chart for details).  ----------------------------------------- 6:49 PM on 07/18/2015 -----------------------------------------  Patient normally not on home oxygen but in the high 80s on room air in the emergency department. Will give Lasix and admitted to the hospital. Plan the patient is well-developed family at the bedside and they're understanding and will comply. On 2 L nasal cannula oxygen the patient is 100%.   The patient and the family that she will need to be admitted for diuresis. Signed out to Dr. Posey Pronto. Dr. Posey Pronto to follow lab results. ____________________________________________   FINAL CLINICAL IMPRESSION(S) / ED DIAGNOSES  Acute CHF exacerbation. Hypoxia    Orbie Pyo, MD 07/18/15 [phone]

## 2015-07-18 NOTE — ED Notes (Signed)
Pt to xray

## 2015-07-18 NOTE — H&P (Signed)
[place] [facility] [place] NAME: Brenda Roman    MR#:  [id number]  DATE OF BIRTH:  1923/08/04  DATE OF ADMISSION:  07/18/2015  PRIMARY CARE PHYSICIAN: Kirk Ruths., MD   REQUESTING/REFERRING PHYSICIAN: Tresa Endo MD  CHIEF COMPLAINT:   Chief Complaint  Patient presents with  . Tachycardia    HISTORY OF PRESENT ILLNESS: Brenda Roman  is a 82 y.o. female with a known history of aortic stenosis and mitral valve disease. She also has a history of multiple strokes in the past who is blind who was recently followed by hospice but apparently was doing better. And was living at home with her husband. Earlier today her daughter came to her house and took her out in a wheelchair. Once she got back home she started feeling like she was very dizzy. And did not feel right. Patient's blood pressure was checked by her husband and it was in the 200s. Patient's heart rate was also elevated. Patient in the ER was noted to have systolic blood pressure greater than 200s. Chest x-ray suggestive of CHF. She is denies any significant shortness of breath or chest pain. No significant lower extremity swelling. She denies any nausea vomiting or diarrhea    L HISTORY:   Past Medical History  Diagnosis Date  . Liver transplanted ([place])   . Macular degeneration   . Coronary artery disease   . Hypertension   . Shingles   . CHF (congestive heart failure) ([place])   . TIA (transient ischemic attack)   . Blind   . Aortic stenosis   . Mitral valve disease     PAST SURGICAL HISTORY: Past Surgical History  Procedure Laterality Date  . Coronary artery bypass graft    . Joint replacement    . Abdominal surgery    . Colonoscopy      SOCIAL HISTORY:  Social History  Substance Use Topics  . Smoking status: Former Research scientist (life sciences)  . Smokeless tobacco: Not on file  . Alcohol Use: No    FAMILY HISTORY:  Family History  Problem Relation Age of Onset  .  Hypertension Father     DRUG ALLERGIES:  Allergies  Allergen Reactions  . Cephalexin Other (See Comments)    Reaction:  Unknown  . Novocain [Procaine] Rash  . Sulfa Antibiotics Rash    REVIEW OF SYSTEMS:   CONSTITUTIONAL: No fever, positive  fatigue And  weakness.  EYES: No blurred or double vision.  EARS, NOSE, AND THROAT: No tinnitus or ear pain.  RESPIRATORY: No cough, positive  shortness of breath,  no wheezing or hemoptysis.  CARDIOVASCULAR: No chest pain, orthopnea, edema.  GASTROINTESTINAL: No nausea, vomiting, diarrhea or abdominal pain.  GENITOURINARY: No dysuria, hematuria.  ENDOCRINE: No polyuria, nocturia,  HEMATOLOGY: No anemia, easy bruising or bleeding SKIN: No rash or lesion. MUSCULOSKELETAL: No joint pain or arthritis.   NEUROLOGIC: No tingling, numbness, weakness.  positive dizziness  PSYCHIATRY: No anxiety or depression.   MEDICATIONS AT HOME:  Prior to Admission medications   Medication Sig Start Date End Date Taking? Authorizing Provider  aspirin EC 81 MG tablet Take 81 mg by mouth daily.   Yes Historical Provider, MD  Cranberry 250 MG TABS Take 250 mg by mouth 2 (two) times daily.   Yes Historical Provider, MD  ezetimibe-simvastatin (VYTORIN) 10-20 MG tablet Take 1 tablet by mouth daily. Patient takes Monday, Wednesday, and Friday   Yes Historical Provider, MD  gabapentin (NEURONTIN) 100 MG  capsule Take 100 mg by mouth 2 (two) times daily.    Yes Historical Provider, MD  mirtazapine (REMERON) 15 MG tablet Take 1 tablet (15 mg total) by mouth at bedtime. 09/03/14  Yes Henreitta Leber, MD  mycophenolate (CELLCEPT) 500 MG tablet Take 500 mg by mouth every 12 (twelve) hours.   Yes Historical Provider, MD  nitrofurantoin, macrocrystal-monohydrate, (MACROBID) 100 MG capsule Take 1 capsule (100 mg total) by mouth daily. 04/07/15  Yes Roda Shutters, FNP  nitroGLYCERIN (NITROSTAT) 0.4 MG SL tablet Place 0.4 mg under the tongue every 5 (five) minutes as needed for  chest pain.   Yes Historical Provider, MD  predniSONE (DELTASONE) 5 MG tablet Take 5 mg by mouth daily.   Yes Historical Provider, MD  rivaroxaban (XARELTO) 20 MG TABS tablet Take 20 mg by mouth daily with supper.   Yes Historical Provider, MD  senna-docusate (SENOKOT-S) 8.6-50 MG per tablet Take 2 tablets by mouth daily.   Yes Historical Provider, MD  traZODone (DESYREL) 100 MG tablet Take 150 mg by mouth at bedtime.   Yes Historical Provider, MD  valACYclovir (VALTREX) 1000 MG tablet Take 1,000 mg by mouth daily.    Yes Historical Provider, MD      PHYSICAL EXAMINATION:   VITAL SIGNS: Blood pressure 201/96, pulse 94, temperature 97.7 F (36.5 C), temperature source Oral, resp. rate 19, height 5\' 5"  (1.651 m), weight 54.432 kg (120 lb), SpO2 98 %.  GENERAL:  82 y.o.-year-old patient lying in the bed with no acute distress.  EYES: Pupils equal, round, reactive to light and accommodation. No scleral icterus. Extraocular muscles intact.  HEENT: Head atraumatic, normocephalic. Oropharynx and nasopharynx clear.  NECK:  Supple, no jugular venous distention. No thyroid enlargement, no tenderness.  LUNGS: Normal breath sounds bilaterally, no wheezing, rales,rhonchi or crepitation. No use of accessory muscles of respiration.  CARDIOVASCULAR: S1, S2 normal.  positive systolic murmur at the left sternal borderurmurs, rubs, or gallops.  ABDOMEN: Soft, nontender, nondistended. Bowel sounds present. No organomegaly or mass.  EXTREMITIES: No pedal edema, cyanosis, or clubbing.  NEUROLOGIC: Cranial nerves II through XII are intact. Muscle strength 5/5 in all extremities. Sensation intact. Gait not checked.  PSYCHIATRIC: The patient is alert and oriented x 3.  SKIN: No obvious rash, lesion, or ulcer.   LABORATORY PANEL:   CBC  Recent Labs Lab 07/18/15 1806  WBC 5.4  HGB 10.5*  HCT 33.9*  PLT 287  MCV 86.3  MCH 26.8  MCHC 31.1*  RDW 17.1*  LYMPHSABS 0.9*  MONOABS 0.4  EOSABS 0.0  BASOSABS  0.0   ------------------------------------------------------------------------------------------------------------------  Chemistries  No results for input(s): NA, K, CL, CO2, GLUCOSE, BUN, CREATININE, CALCIUM, MG, AST, ALT, ALKPHOS, BILITOT in the last 168 hours.  Invalid input(s): GFRCGP ------------------------------------------------------------------------------------------------------------------ CrCl cannot be calculated (Patient has no serum creatinine result on file.). ------------------------------------------------------------------------------------------------------------------ No results for input(s): TSH, T4TOTAL, T3FREE, THYROIDAB in the last 72 hours.  Invalid input(s): FREET3   Coagulation profile No results for input(s): INR, PROTIME in the last 168 hours. ------------------------------------------------------------------------------------------------------------------- No results for input(s): DDIMER in the last 72 hours. -------------------------------------------------------------------------------------------------------------------  Cardiac Enzymes No results for input(s): CKMB, TROPONINI, MYOGLOBIN in the last 168 hours.  Invalid input(s): CK ------------------------------------------------------------------------------------------------------------------ Invalid input(s): POCBNP  ---------------------------------------------------------------------------------------------------------------  Urinalysis    Component Value Date/Time   COLORURINE YELLOW* 12/26/2014 0403   COLORURINE Straw 12/03/2013 2222   APPEARANCEUR Cloudy* 04/07/2015 1336   APPEARANCEUR CLOUDY* 12/26/2014 0403   APPEARANCEUR Clear 12/03/2013 2222   LABSPEC  1.004* 12/26/2014 0403   LABSPEC 1.005 12/03/2013 2222   PHURINE 6.0 12/26/2014 0403   PHURINE 7.0 12/03/2013 2222   GLUCOSEU Negative 04/07/2015 1336   GLUCOSEU Negative 12/03/2013 2222   HGBUR 1+* 12/26/2014 0403   HGBUR  Negative 12/03/2013 2222   BILIRUBINUR Negative 04/07/2015 1336   BILIRUBINUR NEGATIVE 12/26/2014 0403   BILIRUBINUR Negative 12/03/2013 2222   KETONESUR NEGATIVE 12/26/2014 0403   KETONESUR Negative 12/03/2013 2222   PROTEINUR Trace* 04/07/2015 1336   PROTEINUR NEGATIVE 12/26/2014 0403   PROTEINUR Negative 12/03/2013 2222   NITRITE Negative 04/07/2015 1336   NITRITE NEGATIVE 12/26/2014 0403   NITRITE Negative 12/03/2013 2222   LEUKOCYTESUR 1+* 04/07/2015 1336   LEUKOCYTESUR 3+* 12/26/2014 0403   LEUKOCYTESUR Negative 12/03/2013 2222     RADIOLOGY: Dg Chest 2 View  07/18/2015  CLINICAL DATA:  Shortness of breath, tachycardia. EXAM: CHEST  2 VIEW COMPARISON:  Sep 01, 2014. FINDINGS: Stable cardiomediastinal silhouette. Status post coronary artery bypass graft. No pneumothorax is noted. Minimal bilateral pleural effusions are noted. Atherosclerosis of descending thoracic aorta is noted. Increased interstitial densities are noted throughout both lungs concerning for edema. No significant osseous abnormalities noted. IMPRESSION: Increased interstitial densities are noted bilaterally, left greater than right, concerning for pulmonary edema. Minimal bilateral pleural effusions. Electronically Signed   By: Marijo Conception, M.D.   On: 07/18/2015 18:12    EKG: Orders placed or performed during the hospital encounter of 07/18/15  . ED EKG  . ED EKG  . EKG 12-Lead  . EKG 12-Lead    IMPRESSION AND PLAN:  patient is a 82 year old white female with history of liver transplant and valvular disease presents with accelerated hypertension tachycardia    1. Accelerated hypertension : I will place nitroglycerin on the patient. We'll give her IV hydralazine as needed. Adjust her blood pressure medications as needed.   2. Acute CHF diastolic in nature : We will obtain echocardiogram of the heart to check the status of her valves. IV Lasix cardiology consult   3.atrial fibrillation continue Xarelto,  aspirin  With her tachycardia add low-dose metoprolol  4. Severe valvular disease we'll check a echo to assist with patient's prognosis  5. History of liver transplant continue CellCept and prednisone  6. Miscellaneous patient RD on full dose anti-coagulation for DVT prophylaxis   All the records are reviewed and case discussed with ED provider. Management plans discussed with the patient, family and they are in agreement.  CODE STATUS: CODE STATUS discussed with patient and her daughter and husband they'll agree to DO NOT RESUSCITATE status  Code Status History    Date Active Date Inactive Code Status Order ID Comments User Context   08/29/2014  9:02 PM 09/05/2014  3:27 PM DNR [id number]  Demetrios Loll, MD ED   08/23/2014  5:55 PM 08/25/2014  5:42 PM Full Code [id number]  Bettey Costa, MD Inpatient    Questions for Most Recent Historical Code Status (Order [id number])    Question Answer Comment   In the event of cardiac or respiratory ARREST Do not call a "code blue"    In the event of cardiac or respiratory ARREST Do not perform Intubation, CPR, defibrillation or ACLS    In the event of cardiac or respiratory ARREST Use medication by any route, position, wound care, and other measures to relive pain and suffering. May use oxygen, suction and manual treatment of airway obstruction as needed for comfort.     Advance Directive Documentation  Most Recent Value   Type of Advance Directive  Living will, Healthcare Power of Attorney   Pre-existing out of facility DNR order (yellow form or pink MOST form)     "MOST" Form in Place?         TOTAL TIME TAKING CARE OF THIS PATIENT: 55  minutes.    Dustin Flock M.D on 07/18/2015 at 7:22 PM  Between 7am to 6pm - Pager - [phone]  After 6pm go to [email or web address] - password [email or web address] [facility]  Office  [phone]  CC: Primary care physician; Kirk Ruths., MD

## 2015-07-19 ENCOUNTER — Inpatient Hospital Stay
Admit: 2015-07-19 | Discharge: 2015-07-19 | Disposition: A | Discharge status: Home / Self Care | Payer: Medicare Other | Attending: Internal Medicine | Admitting: Internal Medicine

## 2015-07-19 LAB — TROPONIN I
Troponin I: 0.51 ng/mL — ABNORMAL HIGH (ref <0.031)
Troponin I: 0.26 ng/mL — ABNORMAL HIGH (ref <0.031)

## 2015-07-19 LAB — BASIC METABOLIC PANEL
ANION GAP: 7 (ref 5–15)
BUN: 12 mg/dL (ref 6–20)
CHLORIDE: 103 mmol/L (ref 101–111)
CO2: 26 mmol/L (ref 22–32)
Calcium: 8.8 mg/dL — ABNORMAL LOW (ref 8.9–10.3)
Creatinine, Ser: 0.86 mg/dL (ref 0.44–1.00)
GFR calc Af Amer: >60 mL/min (ref >60)
GFR calc non Af Amer: 58 mL/min — ABNORMAL LOW (ref >60)
GLUCOSE: 86 mg/dL (ref 65–99)
POTASSIUM: 3.5 mmol/L (ref 3.5–5.1)
Sodium: 136 mmol/L (ref 135–145)

## 2015-07-19 LAB — ECHOCARDIOGRAM COMPLETE
Height: 66 in
Weight: 1998.4 oz

## 2015-07-19 LAB — APTT: APTT: 28 s (ref 24–36)

## 2015-07-19 LAB — CBC
HEMATOCRIT: 29.2 % — AB (ref 35.0–47.0)
HEMOGLOBIN: 9.3 g/dL — AB (ref 12.0–16.0)
MCH: 27 pg (ref 26.0–34.0)
MCHC: 32 g/dL (ref 32.0–36.0)
MCV: 84.4 fL (ref 80.0–100.0)
PLATELETS: 255 10*3/uL (ref 150–440)
RBC: 3.46 MIL/uL — AB (ref 3.80–5.20)
RDW: 16.9 % — ABNORMAL HIGH (ref 11.5–14.5)
WBC: 6.1 10*3/uL (ref 3.6–11.0)

## 2015-07-19 LAB — PROTIME-INR
INR: 1.03
Prothrombin Time: 13.7 seconds (ref 11.4–15.0)

## 2015-07-19 LAB — HEPARIN LEVEL (UNFRACTIONATED): Heparin Unfractionated: <0.1 IU/mL — ABNORMAL LOW (ref 0.30–0.70)

## 2015-07-19 MED ORDER — EZETIMIBE 10 MG PO TABS
10.0000 mg | ORAL_TABLET | Freq: Every day | ORAL | Status: DC
  Administered 2015-07-20: 10 mg via ORAL
  Filled 2015-07-19: qty 1

## 2015-07-19 MED ORDER — HEPARIN (PORCINE) IN NACL 100-0.45 UNIT/ML-% IJ SOLN
700.0000 [IU]/h | INTRAMUSCULAR | Status: DC
  Filled 2015-07-19: qty 250

## 2015-07-19 MED ORDER — FUROSEMIDE 10 MG/ML IJ SOLN
20.0000 mg | Freq: Every day | INTRAMUSCULAR | Status: DC
  Administered 2015-07-20: 20 mg via INTRAVENOUS
  Filled 2015-07-19: qty 2

## 2015-07-19 MED ORDER — POTASSIUM CHLORIDE CRYS ER 20 MEQ PO TBCR
40.0000 meq | EXTENDED_RELEASE_TABLET | Freq: Once | ORAL | Status: AC
  Administered 2015-07-19: 40 meq via ORAL
  Filled 2015-07-19: qty 2

## 2015-07-19 MED ORDER — RIVAROXABAN 15 MG PO TABS
15.0000 mg | ORAL_TABLET | Freq: Every day | ORAL | Status: DC
  Administered 2015-07-19: 15 mg via ORAL
  Filled 2015-07-19: qty 1

## 2015-07-19 MED ORDER — SENNOSIDES-DOCUSATE SODIUM 8.6-50 MG PO TABS
2.0000 | ORAL_TABLET | Freq: Every day | ORAL | Status: DC
  Administered 2015-07-19: 2 via ORAL
  Filled 2015-07-19: qty 2

## 2015-07-19 MED ORDER — HEPARIN BOLUS VIA INFUSION
3300.0000 [IU] | Freq: Once | INTRAVENOUS | Status: DC
  Filled 2015-07-19: qty 3300

## 2015-07-19 MED ORDER — SIMVASTATIN 20 MG PO TABS
20.0000 mg | ORAL_TABLET | ORAL | Status: DC
  Administered 2015-07-20: 20 mg via ORAL
  Filled 2015-07-19: qty 1

## 2015-07-19 NOTE — Progress Notes (Signed)
Notified physician of elevated troponin, no new orders.

## 2015-07-19 NOTE — Progress Notes (Signed)
[facility] NAME: Brenda Roman    MR#:  [id number]  DATE OF BIRTH:  11/29/1923  SUBJECTIVE:  Patient states feels better than yesterday, headache improved Denies active chest pain Family members at bedside  REVIEW OF SYSTEMS:  CONSTITUTIONAL: No fever, fatigue or weakness.  EYES:Blind EARS, NOSE, AND THROAT: No tinnitus or ear pain.  RESPIRATORY: No cough, shortness of breath, wheezing or hemoptysis.  CARDIOVASCULAR: No chest pain, orthopnea, edema.  GASTROINTESTINAL: No nausea, vomiting, diarrhea or abdominal pain.  GENITOURINARY: No dysuria, hematuria.  ENDOCRINE: No polyuria, nocturia,  HEMATOLOGY: No anemia, easy bruising or bleeding SKIN: No rash or lesion. MUSCULOSKELETAL: No joint pain or arthritis.   NEUROLOGIC: No tingling, numbness, weakness.  PSYCHIATRY: No anxiety or depression.   DRUG ALLERGIES:   Allergies  Allergen Reactions  . Cephalexin Other (See Comments)    Reaction:  Unknown  . Novocain [Procaine] Rash  . Sulfa Antibiotics Rash    VITALS:  Blood pressure 126/68, pulse 80, temperature 98 F (36.7 C), temperature source Oral, resp. rate 16, height 5\' 6"  (1.676 m), weight 56.654 kg (124 lb 14.4 oz), SpO2 95 %.  PHYSICAL EXAMINATION:  VITAL SIGNS: Filed Vitals:   07/19/15 0526 07/19/15 1114  BP: 112/56 126/68  Pulse: 80 80  Temp: 98.3 F (36.8 C) 98 F (36.7 C)  Resp: 16 16   GENERAL:82 y.o.female currently in no acute distress.  HEAD: Normocephalic, atraumatic.  EYES: Pupils equal, round, reactive to light. Extraocular muscles intact. No scleral icterus.  MOUTH: Moist mucosal membrane. Dentition intact. No abscess noted.  EAR, NOSE, THROAT: Clear without exudates. No external lesions.  NECK: Supple. No thyromegaly. No nodules. No JVD.  PULMONARY: Diminished breath sounds secondary to effort without wheeze rails or rhonci. No use of accessory muscles, poor respiratory effort. good  air entry bilaterally CHEST: Nontender to palpation.  CARDIOVASCULAR: S1 and S2. Regular rate and rhythm. 3/6 systolic ejection murmurs, rubs, or gallops. No edema. Pedal pulses 2+ bilaterally.  GASTROINTESTINAL: Soft, nontender, nondistended. No masses. Positive bowel sounds. No hepatosplenomegaly.  MUSCULOSKELETAL: No swelling, clubbing, or edema. Range of motion full in all extremities.  NEUROLOGIC: Cranial nerves II through XII are intact. No gross focal neurological deficits. Sensation intact. Reflexes intact.  SKIN: No ulceration, lesions, rashes, or cyanosis. Skin warm and dry. Turgor intact.  PSYCHIATRIC: Mood, affect within normal limits. The patient is awake, alert and oriented x 3. Insight, judgment intact.      LABORATORY PANEL:   CBC  Recent Labs Lab 07/19/15 0320  WBC 6.1  HGB 9.3*  HCT 29.2*  PLT 255   ------------------------------------------------------------------------------------------------------------------  Chemistries   Recent Labs Lab 07/18/15 1806 07/19/15 0320  NA 134* 136  K 4.1 3.5  CL 104 103  CO2 23 26  GLUCOSE 107* 86  BUN 12 12  CREATININE 0.66 0.86  CALCIUM 9.0 8.8*  AST 26  --   ALT 17  --   ALKPHOS 78  --   BILITOT 0.5  --    ------------------------------------------------------------------------------------------------------------------  Cardiac Enzymes  Recent Labs Lab 07/19/15 0917  TROPONINI 0.26*   ------------------------------------------------------------------------------------------------------------------  RADIOLOGY:  Dg Chest 2 View  07/18/2015  CLINICAL DATA:  Shortness of breath, tachycardia. EXAM: CHEST  2 VIEW COMPARISON:  Sep 01, 2014. FINDINGS: Stable cardiomediastinal silhouette. Status post coronary artery bypass graft. No pneumothorax is noted. Minimal bilateral pleural effusions are noted. Atherosclerosis of descending thoracic aorta is noted. Increased interstitial densities are noted  throughout  both lungs concerning for edema. No significant osseous abnormalities noted. IMPRESSION: Increased interstitial densities are noted bilaterally, left greater than right, concerning for pulmonary edema. Minimal bilateral pleural effusions. Electronically Signed   By: Marijo Conception, M.D.   On: 07/18/2015 18:12    EKG:   Orders placed or performed during the hospital encounter of 07/18/15  . ED EKG  . ED EKG  . EKG 12-Lead  . EKG 12-Lead    ASSESSMENT AND PLAN:   82 year old female admitted 07/18/2015 with evidence of heart failure  1. Hypertensive emergency with congestive heart failure: Blood pressure has improved symptoms have improved-continue with home blood pressure medications, can decrease IV diuresis, echocardiogram pending from baseline, cardiology input appreciated 2. Elevated troponin: Originally I had concern given upward trend started on heparin however, discuss case with cardiology as patient already on Xarelto and now downward trending most likely demand in nature does not require heparin-continue Xarelto 3. Hyperlipidemia unspecified statin therapy 4. Post liver transplant: Continue CellCept, prednisone, Valtrex 4. Venous thromboembolism prophylactic: Therapeutic Xarelto     All the records are reviewed and case discussed with Care Management/Social Workerr. Management plans discussed with the patient, family and they are in agreement.  CODE STATUS: DO NOT RESUSCITATE  TOTAL TIME TAKING CARE OF THIS PATIENT: 28 minutes.   POSSIBLE D/C IN 1 DAYS, DEPENDING ON CLINICAL CONDITION.   Hower,  Karenann Cai.D on 07/19/2015 at 2:11 PM  Between 7am to 6pm - Pager - [phone]  After 6pm: House Pager: - [facility]  Office  [phone]  CC: Primary care physician; Kirk Ruths., MD

## 2015-07-19 NOTE — Progress Notes (Signed)
*  PRELIMINARY RESULTS* Echocardiogram 2D Echocardiogram has been performed.  Brenda Roman 07/19/2015, 8:47 AM

## 2015-07-19 NOTE — Plan of Care (Deleted)
Problem: Safety: Goal: Ability to remain free from injury will improve Outcome: Not Progressing Pt is impulsive and have periods of incontinence.

## 2015-07-19 NOTE — Care Management (Signed)
Met with patient, spouse and son at bedside. They are not interested in resuming hospice services or any home health at this time. Patient has a hired caregiver from 8-1 Monday through Friday. No additional needs per spouse needed. Case closed.

## 2015-07-19 NOTE — Plan of Care (Signed)
Problem: Cardiac: Goal: Ability to achieve and maintain adequate cardiopulmonary perfusion will improve Outcome: Progressing Pt is diuresing.

## 2015-07-19 NOTE — Consult Note (Signed)
Reason for Consult: Tachycardia A. fib shortness of breath Referring Physician: Dr. Posey Pronto, primary physician Frazier Richards MD  Brenda Roman is an 82 y.o. female.  HPI: Patient's history of multiple medical problems including aortic stenosis mitral valve disease multiple CVAs blindness coronary disease hypertension and congestive heart failure atrial fibrillation followed by Dr. Ubaldo Glassing. Patient admitted with tachycardia chest pressure or shortness of breath. Patient developed borderline troponins. No further arrhythmias on telemetry. Patient feels reasonably well and ready to go home. Patient complains of headache symptoms especially while on nitroglycerin. Patient denies any significant bleeding. Chest x-ray suggestive of congestive heart failure with shortness of breath. Patient was having elevated blood pressures over 200 which concerned her husband, symptoms started when patient went out with his with her daughter for evaluation complains of mild vertigo dizziness.  Past Medical History  Diagnosis Date  . Liver transplanted ([place])   . Macular degeneration   . Coronary artery disease   . Hypertension   . Shingles   . CHF (congestive heart failure) ([place])   . TIA (transient ischemic attack)   . Blind   . Aortic stenosis   . Mitral valve disease     Past Surgical History  Procedure Laterality Date  . Coronary artery bypass graft    . Joint replacement    . Abdominal surgery    . Colonoscopy      Family History  Problem Relation Age of Onset  . Hypertension Father     Social History:  reports that she has quit smoking. She does not have any smokeless tobacco history on file. She reports that she does not drink alcohol or use illicit drugs.  Allergies:  Allergies  Allergen Reactions  . Cephalexin Other (See Comments)    Reaction:  Unknown  . Novocain [Procaine] Rash  . Sulfa Antibiotics Rash    Medications: I have reviewed the patient's current medications.  Results for  orders placed or performed during the hospital encounter of 07/18/15 (from the past 48 hour(s))  CBC with Differential     Status: Abnormal   Collection Time: 07/18/15  6:06 PM  Result Value Ref Range   WBC 5.4 3.6 - 11.0 K/uL   RBC 3.93 3.80 - 5.20 MIL/uL   Hemoglobin 10.5 (L) 12.0 - 16.0 g/dL   HCT 33.9 (L) 35.0 - 47.0 %   MCV 86.3 80.0 - 100.0 fL   MCH 26.8 26.0 - 34.0 pg   MCHC 31.1 (L) 32.0 - 36.0 g/dL   RDW 17.1 (H) 11.5 - 14.5 %   Platelets 287 150 - 440 K/uL   Neutrophils Relative % 75 %   Neutro Abs 4.1 1.4 - 6.5 K/uL   Lymphocytes Relative 17 %   Lymphs Abs 0.9 (L) 1.0 - 3.6 K/uL   Monocytes Relative 7 %   Monocytes Absolute 0.4 0.2 - 0.9 K/uL   Eosinophils Relative 1 %   Eosinophils Absolute 0.0 0 - 0.7 K/uL   Basophils Relative 0 %   Basophils Absolute 0.0 0 - 0.1 K/uL  Comprehensive metabolic panel     Status: Abnormal   Collection Time: 07/18/15  6:06 PM  Result Value Ref Range   Sodium 134 (L) 135 - 145 mmol/L   Potassium 4.1 3.5 - 5.1 mmol/L   Chloride 104 101 - 111 mmol/L   CO2 23 22 - 32 mmol/L   Glucose, Bld 107 (H) 65 - 99 mg/dL   BUN 12 6 - 20 mg/dL   Creatinine, Ser 0.66  0.44 - 1.00 mg/dL   Calcium 9.0 8.9 - 10.3 mg/dL   Total Protein 7.1 6.5 - 8.1 g/dL   Albumin 3.9 3.5 - 5.0 g/dL   AST 26 15 - 41 U/L   ALT 17 14 - 54 U/L   Alkaline Phosphatase 78 38 - 126 U/L   Total Bilirubin 0.5 0.3 - 1.2 mg/dL   GFR calc non Af Amer >60 >60 mL/min   GFR calc Af Amer >60 >60 mL/min    Comment: (NOTE) The eGFR has been calculated using the CKD EPI equation. This calculation has not been validated in all clinical situations. eGFR's persistently <60 mL/min signify possible Chronic Kidney Disease.    Anion gap 7 5 - 15  Troponin I     Status: Abnormal   Collection Time: 07/18/15  6:06 PM  Result Value Ref Range   Troponin I 0.07 (H) <0.031 ng/mL    Comment: READ BACK AND VERIFIED WITH [place] 07/18/15 SDR        PERSISTENTLY INCREASED  TROPONIN VALUES IN THE RANGE OF 0.04-0.49 ng/mL CAN BE SEEN IN:       -UNSTABLE ANGINA       -CONGESTIVE HEART FAILURE       -MYOCARDITIS       -CHEST TRAUMA       -ARRYHTHMIAS       -LATE PRESENTING MYOCARDIAL INFARCTION       -COPD   CLINICAL FOLLOW-UP RECOMMENDED.   TSH     Status: None   Collection Time: 07/18/15  9:22 PM  Result Value Ref Range   TSH 3.339 0.350 - 4.500 uIU/mL  Troponin I     Status: Abnormal   Collection Time: 07/18/15  9:22 PM  Result Value Ref Range   Troponin I 0.46 (H) <0.031 ng/mL    Comment: READ BACK AND VERIFIED WITH TAKERA  NESBITT AT 2239 07/18/15 SDR        PERSISTENTLY INCREASED TROPONIN VALUES IN THE RANGE OF 0.04-0.49 ng/mL CAN BE SEEN IN:       -UNSTABLE ANGINA       -CONGESTIVE HEART FAILURE       -MYOCARDITIS       -CHEST TRAUMA       -ARRYHTHMIAS       -LATE PRESENTING MYOCARDIAL INFARCTION       -COPD   CLINICAL FOLLOW-UP RECOMMENDED.   Troponin I     Status: Abnormal   Collection Time: 07/19/15  3:20 AM  Result Value Ref Range   Troponin I 0.51 (H) <0.031 ng/mL    Comment: PREVIOUS RESULT CALLED BY SDR @ 9169 ON 07/18/2015 CAF        POSSIBLE MYOCARDIAL ISCHEMIA. SERIAL TESTING RECOMMENDED.   CBC     Status: Abnormal   Collection Time: 07/19/15  3:20 AM  Result Value Ref Range   WBC 6.1 3.6 - 11.0 K/uL   RBC 3.46 (L) 3.80 - 5.20 MIL/uL   Hemoglobin 9.3 (L) 12.0 - 16.0 g/dL   HCT 29.2 (L) 35.0 - 47.0 %   MCV 84.4 80.0 - 100.0 fL   MCH 27.0 26.0 - 34.0 pg   MCHC 32.0 32.0 - 36.0 g/dL   RDW 16.9 (H) 11.5 - 14.5 %   Platelets 255 150 - 440 K/uL  Basic metabolic panel     Status: Abnormal   Collection Time: 07/19/15  3:20 AM  Result Value Ref Range   Sodium 136 135 - 145 mmol/L  Potassium 3.5 3.5 - 5.1 mmol/L   Chloride 103 101 - 111 mmol/L   CO2 26 22 - 32 mmol/L   Glucose, Bld 86 65 - 99 mg/dL   BUN 12 6 - 20 mg/dL   Creatinine, Ser 0.86 0.44 - 1.00 mg/dL   Calcium 8.8 (L) 8.9 - 10.3 mg/dL   GFR calc non Af  Amer 58 (L) >60 mL/min   GFR calc Af Amer >60 >60 mL/min    Comment: (NOTE) The eGFR has been calculated using the CKD EPI equation. This calculation has not been validated in all clinical situations. eGFR's persistently <60 mL/min signify possible Chronic Kidney Disease.    Anion gap 7 5 - 15  Troponin I     Status: Abnormal   Collection Time: 07/19/15  9:17 AM  Result Value Ref Range   Troponin I 0.26 (H) <0.031 ng/mL    Comment: PREVIOUS RESULT CALLED 07/18/15 AT 2239 BY SDR/DAS        PERSISTENTLY INCREASED TROPONIN VALUES IN THE RANGE OF 0.04-0.49 ng/mL CAN BE SEEN IN:       -UNSTABLE ANGINA       -CONGESTIVE HEART FAILURE       -MYOCARDITIS       -CHEST TRAUMA       -ARRYHTHMIAS       -LATE PRESENTING MYOCARDIAL INFARCTION       -COPD   CLINICAL FOLLOW-UP RECOMMENDED.   Heparin level (unfractionated)     Status: Abnormal   Collection Time: 07/19/15  9:17 AM  Result Value Ref Range   Heparin Unfractionated <0.10 (L) 0.30 - 0.70 IU/mL    Comment:        IF HEPARIN RESULTS ARE BELOW EXPECTED VALUES, AND PATIENT DOSAGE HAS BEEN CONFIRMED, SUGGEST FOLLOW UP TESTING OF ANTITHROMBIN III LEVELS. RESULT REPEATED AND VERIFIED   Protime-INR     Status: None   Collection Time: 07/19/15  9:17 AM  Result Value Ref Range   Prothrombin Time 13.7 11.4 - 15.0 seconds   INR 1.03   APTT     Status: None   Collection Time: 07/19/15  9:17 AM  Result Value Ref Range   aPTT 28 24 - 36 seconds    Dg Chest 2 View  07/18/2015  CLINICAL DATA:  Shortness of breath, tachycardia. EXAM: CHEST  2 VIEW COMPARISON:  Sep 01, 2014. FINDINGS: Stable cardiomediastinal silhouette. Status post coronary artery bypass graft. No pneumothorax is noted. Minimal bilateral pleural effusions are noted. Atherosclerosis of descending thoracic aorta is noted. Increased interstitial densities are noted throughout both lungs concerning for edema. No significant osseous abnormalities noted. IMPRESSION: Increased  interstitial densities are noted bilaterally, left greater than right, concerning for pulmonary edema. Minimal bilateral pleural effusions. Electronically Signed   By: Marijo Conception, M.D.   On: 07/18/2015 18:12    Review of Systems  Constitutional: Positive for malaise/fatigue.  HENT: Positive for congestion and sore throat.   Eyes:       Blindness  Respiratory: Positive for shortness of breath.   Cardiovascular: Positive for chest pain and palpitations.  Gastrointestinal: Negative.   Genitourinary: Negative.   Musculoskeletal: Negative.   Skin: Negative.   Neurological: Positive for dizziness, weakness and headaches.  Endo/Heme/Allergies: Negative.   Psychiatric/Behavioral: Negative.    Blood pressure 126/68, pulse 80, temperature 98 F (36.7 C), temperature source Oral, resp. rate 16, height 5' 6"  (1.676 m), weight 56.654 kg (124 lb 14.4 oz), SpO2 100 %. Physical Exam  Nursing note  and vitals reviewed. Constitutional: She is oriented to person, place, and time. She appears well-developed and well-nourished.  HENT:  Head: Normocephalic and atraumatic.  Right Ear: Decreased hearing is noted.  Left Ear: Decreased hearing is noted.  Nose: Nose normal.  Mouth/Throat: Oropharynx is clear and moist.  Eyes:  Blindness   Neck: Normal range of motion. Neck supple.  Cardiovascular: Normal rate and regular rhythm.  Exam reveals gallop.   Murmur heard. Respiratory: Effort normal and breath sounds normal.  GI: Soft. Bowel sounds are normal.  Musculoskeletal: Normal range of motion.  Neurological: She is alert and oriented to person, place, and time. She has normal reflexes.  Skin: Skin is warm and dry.  Psychiatric: She has a normal mood and affect.    Assessment/Plan: Tachycardia Atrial fibrillation Shortness of breath Hypertension Congestive heart failure CVA by history Hyperlipidemia History of liver transplant Coronary artery disease Demand  ischemia Anemia . PLAN Agree with admission to telemetry Follow-up EKGs troponins Short-term anticoagulation Echocardiogram may be helpful Diuretic therapy for shortness of breath mild heart failure Presumes relative for long-term anticoagulation for A. Fib Discontinue heparin Consider discontinuing nitroglycerin because of headaches Recommend outpatient follow-up with cardiology Dr. Maxcine Ham D. 07/19/2015, 1:32 PM

## 2015-07-19 NOTE — Care Management (Signed)
TC to [place] Glad with [facility]. Patient was discharged from their agency 03/21. She had home hospice services for heart disease. Will follow progresion

## 2015-07-20 LAB — MYCOPHENOLIC ACID (CELLCEPT)
MPA Glucuronide: 22 ug/mL (ref 15–125)
MPA: 1.9 ug/mL (ref 1.0–3.5)

## 2015-07-20 NOTE — Evaluation (Signed)
Physical Therapy Evaluation Patient Details Name: Brenda Roman MRN: [id number] DOB: 09/07/1923 Today's Date: 07/20/2015   History of Present Illness  82 yo F presented to ED for tachycardia found to have acute CHF exacerbation and hypoxia. PMH includes liver transplant, CHF, blindness, CAD, TIA, HTN, shingles and stroke.  Clinical Impression  Pt demonstrated generalized weakness and difficulty walking. She required min A for bed mobility. Transfers and ambulation required min A to mod A depending on fatigue level with SW. She ambulated up to 12 ft with cues for safety and navigation as pt is blind. Per husband pt has improved greatly and feels comfortable taking care of pt at home. They have all necessary equipment and around the clock caregiver support. With minimal activity pt becomes very fatigued. HHPT is recommended to improved functional mobility and endurance to reduce caregiver burden of care as well as assess home safety. PT recommended that pt use her FWW in addition to physical assist for improved safety. Pt will benefit from skilled PT services to increase functional I and mobility for safe discharge.     Follow Up Recommendations Home health PT;Supervision for mobility/OOB;Supervision/Assistance - 24 hour    Equipment Recommendations  Other (comment) (Pt has recommended FWW.)    Recommendations for Other Services       Precautions / Restrictions Precautions Precautions: Fall      Mobility  Bed Mobility Overal bed mobility: Needs Assistance Bed Mobility: Supine to Sit;Sit to Supine     Supine to sit: Min assist Sit to supine: Min assist   General bed mobility comments: uses rail, cues for technique as pt is blind  Transfers Overall transfer level: Needs assistance Equipment used: Standard walker Transfers: Sit to/from [employer] Transfers Sit to Stand: Min assist Stand pivot transfers: Min assist       General transfer comment: pt unsteady adn  requires cues for staying in walker, increasing BOS and safe technique  Ambulation/Gait Ambulation/Gait assistance: Min assist;Mod assist Ambulation Distance (Feet): 12 Feet Assistive device: Standard walker Gait Pattern/deviations: Decreased stride length;Shuffle;Trunk flexed;Drifts right/left;Narrow base of support     General Gait Details: Ambulation difficult due to weakness, unsteadiness and blindness. Requires min to mod A as pt fatigues to maintain balance and advance walker.  Stairs            Wheelchair Mobility    Modified Rankin (Stroke Patients Only)       Balance Overall balance assessment: Needs assistance Sitting-balance support: Feet supported Sitting balance-Leahy Scale: Fair Sitting balance - Comments: maintains balance with feet supported   Standing balance support: Bilateral upper extremity supported Standing balance-Leahy Scale: Poor Standing balance comment: requires min A to maintain standing balance                             Pertinent Vitals/Pain Pain Assessment: No/denies pain    Home Living Family/patient expects to be discharged to:: Private residence Living Arrangements: Spouse/significant other;Children Available Help at Discharge: Family;Available 24 hours/day;Personal care attendant (caregiver comes in M-F 8-1) Type of Home: House Home Access: Ramped entrance     Home Layout: Able to live on main level with bedroom/bathroom Home Equipment: Walker - 2 wheels;Bedside commode;Wheelchair - manual;Hospital bed      Prior Function Level of Independence: Needs assistance   Gait / Transfers Assistance Needed: Requires physical assistance from husband or caregiver for all mobility. She typically ambulates limited household distances with HHA or FWW and assistance.  ADL's / Homemaking Assistance Needed: provided by family  Comments: Pt was previously on hospice services until 3/21 when family opted to discontinue as pt had  improved. She is cared for 24 hrs per day.      Hand Dominance        Extremity/Trunk Assessment   Upper Extremity Assessment: Generalized weakness           Lower Extremity Assessment: Generalized weakness      Cervical / Trunk Assessment: Kyphotic  Communication   Communication: HOH  Cognition Arousal/Alertness: Awake/alert Behavior During Therapy: Impulsive Overall Cognitive Status: Within Functional Limits for tasks assessed                      General Comments      Exercises Other Exercises Other Exercises: Dynamic seated and standing balance with reaching out of BOS x1 min 4 sets with seated therapeutic rest breaks between. Required min A to maintain balance. Other Exercises: Pt ambulated 12 ft x2 with standard walker and min to mod A as pt fatigued. Constant cues for staying inside walker and safe navigation. Cues for increaseing BOS.      Assessment/Plan    PT Assessment Patient needs continued PT services  PT Diagnosis Difficulty walking;Generalized weakness   PT Problem List Decreased strength;Decreased activity tolerance;Decreased balance;Decreased mobility;Decreased knowledge of use of DME;Decreased safety awareness  PT Treatment Interventions DME instruction;Gait training;Therapeutic activities;Therapeutic exercise;Balance training;Neuromuscular re-education;Patient/family education   PT Goals (Current goals can be found in the Care Plan section) Acute Rehab PT Goals Patient Stated Goal: to go home PT Goal Formulation: With patient/family Time For Goal Achievement: 08/03/15 Potential to Achieve Goals: Fair    Frequency Min 2X/week   Barriers to discharge   none    Co-evaluation               End of Session Equipment Utilized During Treatment: Gait belt Activity Tolerance: Patient limited by fatigue Patient left: in bed;with call bell/phone within reach;with bed alarm set;with family/visitor present Nurse Communication:  Mobility status;Other (comment) (level of fatigue after toileting, pt c/o dizziness)         Time: [id number] PT Time Calculation (min) (ACUTE ONLY): 38 min   Charges:   PT Evaluation $PT Eval Moderate Complexity: 1 Procedure PT Treatments $Gait Training: 8-22 mins $Therapeutic Activity: 8-22 mins   PT G Codes:        Neoma Laming, PT, DPT  07/20/2015, 11:00 AM [phone]

## 2015-07-20 NOTE — Care Management Note (Addendum)
Case Management Note  Patient Details  Name: Brenda Roman MRN: [id number] Date of Birth: 13-Mar-1924  Subjective/Objective:  Spoke with patients spouse. He is interested in home health PT. No agency preference. Referral to [facility] for PT. No additional needs identified. Case closed   . Offered SN and patients husnabd refused               Action/Plan:   Expected Discharge Date:                  Expected Discharge Plan:  [place]  In-House Referral:     Discharge planning Services  CM Consult  Post Acute Care Choice:    Choice offered to:  Patient  DME Arranged:    DME Agency:     HH Arranged:  PT [place]:  [facility]  Status of Service:  Completed, signed off  Medicare Important Message Given:    Date Medicare IM Given:    Medicare IM give by:    Date Additional Medicare IM Given:    Additional Medicare Important Message give by:     If discussed at [place] of Stay Meetings, dates discussed:    Additional Comments:  Jolly Mango, RN 07/20/2015, 11:13 AM

## 2015-07-20 NOTE — Discharge Summary (Signed)
[facility] [place] NAME: Brenda Roman    MR#:  [id number]  DATE OF BIRTH:  1923/11/11  DATE OF ADMISSION:  07/18/2015 ADMITTING PHYSICIAN: Dustin Flock, MD  DATE OF DISCHARGE: No discharge date for patient encounter.  PRIMARY CARE PHYSICIAN: Kirk Ruths., MD    ADMISSION DIAGNOSIS:  Hypoxia [R09.02] Acute on chronic congestive heart failure, unspecified congestive heart failure type ([place]) [I50.9]  DISCHARGE DIAGNOSIS:  Hypertensive emergency with evidence of congestive heart failure Elevated troponin-demand ischemia  SECONDARY DIAGNOSIS:   Past Medical History  Diagnosis Date  . Liver transplanted ([place])   . Macular degeneration   . Coronary artery disease   . Hypertension   . Shingles   . CHF (congestive heart failure) ([place])   . TIA (transient ischemic attack)   . Blind   . Aortic stenosis   . Mitral valve disease     HOSPITAL COURSE:  Brenda Roman  is a 82 y.o. female admitted 07/18/2015 with chief complaint shortness of breath. Please see H&P performed by Dr. Posey Pronto for further information. On admission to the hospital she is noted to have marked elevation in blood pressure with pulmonary edema. She did require some IV diuresis for acute pulmonary edema. Her blood pressure has improved her symptoms have markedly improved she no longer requires oxygen. She is evaluated by cardiology during her hospitalization who helped in her management.  DISCHARGE CONDITIONS:   Stable/improved  CONSULTS OBTAINED:  Treatment Team:  Yolonda Kida, MD  DRUG ALLERGIES:   Allergies  Allergen Reactions  . Cephalexin Other (See Comments)    Reaction:  Unknown  . Novocain [Procaine] Rash  . Sulfa Antibiotics Rash    DISCHARGE MEDICATIONS:   Current Discharge Medication List    CONTINUE these medications which have NOT CHANGED   Details  aspirin EC 81 MG tablet Take 81 mg by mouth daily.    Cranberry 250 MG  TABS Take 250 mg by mouth 2 (two) times daily.    ezetimibe-simvastatin (VYTORIN) 10-20 MG tablet Take 1 tablet by mouth daily. Patient takes Monday, Wednesday, and Friday    gabapentin (NEURONTIN) 100 MG capsule Take 100 mg by mouth 2 (two) times daily.     mirtazapine (REMERON) 15 MG tablet Take 1 tablet (15 mg total) by mouth at bedtime. Qty: 30 tablet, Refills: 1    mycophenolate (CELLCEPT) 500 MG tablet Take 500 mg by mouth every 12 (twelve) hours.    nitrofurantoin, macrocrystal-monohydrate, (MACROBID) 100 MG capsule Take 1 capsule (100 mg total) by mouth daily. Qty: 30 capsule, Refills: 3    nitroGLYCERIN (NITROSTAT) 0.4 MG SL tablet Place 0.4 mg under the tongue every 5 (five) minutes as needed for chest pain.    predniSONE (DELTASONE) 5 MG tablet Take 5 mg by mouth daily.    rivaroxaban (XARELTO) 20 MG TABS tablet Take 20 mg by mouth daily with supper.    senna-docusate (SENOKOT-S) 8.6-50 MG per tablet Take 2 tablets by mouth daily.    traZODone (DESYREL) 100 MG tablet Take 150 mg by mouth at bedtime.    valACYclovir (VALTREX) 1000 MG tablet Take 1,000 mg by mouth daily.          DISCHARGE INSTRUCTIONS:    DIET:  Cardiac diet  DISCHARGE CONDITION:  Stable  ACTIVITY:  Activity as tolerated  OXYGEN:  Home Oxygen: No.   Oxygen Delivery: room air  DISCHARGE LOCATION:  home   If you experience worsening of your  admission symptoms, develop shortness of breath, life threatening emergency, suicidal or homicidal thoughts you must seek medical attention immediately by calling 911 or calling your MD immediately  if symptoms less severe.  You Must read complete instructions/literature along with all the possible adverse reactions/side effects for all the Medicines you take and that have been prescribed to you. Take any new Medicines after you have completely understood and accpet all the possible adverse reactions/side effects.   Please note  You were cared for by  a hospitalist during your hospital stay. If you have any questions about your discharge medications or the care you received while you were in the hospital after you are discharged, you can call the unit and asked to speak with the hospitalist on call if the hospitalist that took care of you is not available. Once you are discharged, your primary care physician will handle any further medical issues. Please note that NO REFILLS for any discharge medications will be authorized once you are discharged, as it is imperative that you return to your primary care physician (or establish a relationship with a primary care physician if you do not have one) for your aftercare needs so that they can reassess your need for medications and monitor your lab values.    On the day of Discharge:   VITAL SIGNS:  Blood pressure 102/55, pulse 85, temperature 97.8 F (36.6 C), temperature source Oral, resp. rate 18, height 5\' 6"  (1.676 m), weight 117.198 kg (258 lb 6 oz), SpO2 95 %.  I/O:   Intake/Output Summary (Last 24 hours) at 07/20/15 1019 Last data filed at 07/20/15 0458  Gross per 24 hour  Intake    240 ml  Output   1700 ml  Net  -1460 ml    PHYSICAL EXAMINATION:  GENERAL:  82 y.o.-year-old patient lying in the bed with no acute distress.  EYES: Pupils equal, round, reactive to light and accommodation. No scleral icterus. Extraocular muscles intact. - blind HEENT: Head atraumatic, normocephalic. Oropharynx and nasopharynx clear.  NECK:  Supple, no jugular venous distention. No thyroid enlargement, no tenderness.  LUNGS: Normal breath sounds bilaterally, no wheezing, rales,rhonchi or crepitation. No use of accessory muscles of respiration.  CARDIOVASCULAR: S1, S2 normal. 3/6 SEM , rubs, or gallops.  ABDOMEN: Soft, non-tender, non-distended. Bowel sounds present. No organomegaly or mass.  EXTREMITIES: No pedal edema, cyanosis, or clubbing.  NEUROLOGIC: Cranial nerves II through XII are intact. Muscle  strength 5/5 in all extremities. Sensation intact. Gait not checked.  PSYCHIATRIC: The patient is alert and oriented x 3.  SKIN: No obvious rash, lesion, or ulcer.   DATA REVIEW:   CBC  Recent Labs Lab 07/19/15 0320  WBC 6.1  HGB 9.3*  HCT 29.2*  PLT 255    Chemistries   Recent Labs Lab 07/18/15 1806 07/19/15 0320  NA 134* 136  K 4.1 3.5  CL 104 103  CO2 23 26  GLUCOSE 107* 86  BUN 12 12  CREATININE 0.66 0.86  CALCIUM 9.0 8.8*  AST 26  --   ALT 17  --   ALKPHOS 78  --   BILITOT 0.5  --     Cardiac Enzymes  Recent Labs Lab 07/19/15 0917  TROPONINI 0.26*    Microbiology Results  Results for orders placed or performed in visit on 04/07/15  Microscopic Examination     Status: Abnormal   Collection Time: 04/07/15  1:36 PM  Result Value Ref Range Status   WBC, UA >30 (  H) 0 -  5 /hpf Final   RBC, UA None seen 0 -  2 /hpf Final   Epithelial Cells (non renal) 0-10 0 - 10 /hpf Final   Mucus, UA Present (A) Not Estab. Final   Bacteria, UA Many (A) None seen/Few Final    RADIOLOGY:  Dg Chest 2 View  07/18/2015  CLINICAL DATA:  Shortness of breath, tachycardia. EXAM: CHEST  2 VIEW COMPARISON:  Sep 01, 2014. FINDINGS: Stable cardiomediastinal silhouette. Status post coronary artery bypass graft. No pneumothorax is noted. Minimal bilateral pleural effusions are noted. Atherosclerosis of descending thoracic aorta is noted. Increased interstitial densities are noted throughout both lungs concerning for edema. No significant osseous abnormalities noted. IMPRESSION: Increased interstitial densities are noted bilaterally, left greater than right, concerning for pulmonary edema. Minimal bilateral pleural effusions. Electronically Signed   By: Marijo Conception, M.D.   On: 07/18/2015 18:12     Management plans discussed with the patient, family and they are in agreement.  CODE STATUS:     Code Status Orders        Start     Ordered   07/18/15 2121  Do not attempt  resuscitation (DNR)   Continuous    Question Answer Comment  In the event of cardiac or respiratory ARREST Do not call a "code blue"   In the event of cardiac or respiratory ARREST Do not perform Intubation, CPR, defibrillation or ACLS   In the event of cardiac or respiratory ARREST Use medication by any route, position, wound care, and other measures to relive pain and suffering. May use oxygen, suction and manual treatment of airway obstruction as needed for comfort.      07/18/15 2120    Code Status History    Date Active Date Inactive Code Status Order ID Comments User Context   08/29/2014  9:02 PM 09/05/2014  3:27 PM DNR [id number]  Demetrios Loll, MD ED   08/23/2014  5:55 PM 08/25/2014  5:42 PM Full Code [id number]  Bettey Costa, MD Inpatient    Advance Directive Documentation        Most Recent Value   Type of Advance Directive  Living will   Pre-existing out of facility DNR order (yellow form or pink MOST form)     "MOST" Form in Place?        TOTAL TIME TAKING CARE OF THIS PATIENT: 28 minutes.    Hower,  Karenann Cai.D on 07/20/2015 at 10:19 AM  Between 7am to 6pm - Pager - [phone]  After 6pm go to [email or web address] - password [email or web address] [facility]  Office  [phone]  CC: Primary care physician; Kirk Ruths., MD

## 2015-07-20 NOTE — Progress Notes (Signed)
Patient d/c'd home with PT. Education provided, no questions at this time. Patient picked up by husband. Telemetry removed. Brenda Roman

## 2015-07-29 ENCOUNTER — Inpatient Hospital Stay
Admission: EM | Admit: 2015-07-29 | Discharge: 2015-08-01 | DRG: 641 | Disposition: A | Discharge status: Home Health | Payer: Medicare Other | Attending: Internal Medicine | Admitting: Internal Medicine

## 2015-07-29 DIAGNOSIS — E871 Hypo-osmolality and hyponatremia: Principal | ICD-10-CM | POA: Diagnosis present

## 2015-07-29 DIAGNOSIS — N390 Urinary tract infection, site not specified: Secondary | ICD-10-CM | POA: Diagnosis present

## 2015-07-29 DIAGNOSIS — I110 Hypertensive heart disease with heart failure: Secondary | ICD-10-CM | POA: Diagnosis present

## 2015-07-29 DIAGNOSIS — I5032 Chronic diastolic (congestive) heart failure: Secondary | ICD-10-CM | POA: Diagnosis present

## 2015-07-29 DIAGNOSIS — Z7952 Long term (current) use of systemic steroids: Secondary | ICD-10-CM

## 2015-07-29 DIAGNOSIS — Z882 Allergy status to sulfonamides status: Secondary | ICD-10-CM

## 2015-07-29 DIAGNOSIS — Z951 Presence of aortocoronary bypass graft: Secondary | ICD-10-CM

## 2015-07-29 DIAGNOSIS — K743 Primary biliary cirrhosis: Secondary | ICD-10-CM | POA: Diagnosis present

## 2015-07-29 DIAGNOSIS — I959 Hypotension, unspecified: Secondary | ICD-10-CM | POA: Diagnosis present

## 2015-07-29 DIAGNOSIS — I69351 Hemiplegia and hemiparesis following cerebral infarction affecting right dominant side: Secondary | ICD-10-CM

## 2015-07-29 DIAGNOSIS — I2510 Atherosclerotic heart disease of native coronary artery without angina pectoris: Secondary | ICD-10-CM | POA: Diagnosis present

## 2015-07-29 DIAGNOSIS — E861 Hypovolemia: Secondary | ICD-10-CM | POA: Diagnosis present

## 2015-07-29 DIAGNOSIS — I080 Rheumatic disorders of both mitral and aortic valves: Secondary | ICD-10-CM | POA: Diagnosis present

## 2015-07-29 DIAGNOSIS — Z87891 Personal history of nicotine dependence: Secondary | ICD-10-CM

## 2015-07-29 DIAGNOSIS — B029 Zoster without complications: Secondary | ICD-10-CM | POA: Diagnosis present

## 2015-07-29 DIAGNOSIS — Z888 Allergy status to other drugs, medicaments and biological substances status: Secondary | ICD-10-CM

## 2015-07-29 DIAGNOSIS — Z7901 Long term (current) use of anticoagulants: Secondary | ICD-10-CM

## 2015-07-29 DIAGNOSIS — Z7982 Long term (current) use of aspirin: Secondary | ICD-10-CM

## 2015-07-29 DIAGNOSIS — Z944 Liver transplant status: Secondary | ICD-10-CM

## 2015-07-29 DIAGNOSIS — I482 Chronic atrial fibrillation: Secondary | ICD-10-CM | POA: Diagnosis present

## 2015-07-29 DIAGNOSIS — H540 Blindness, both eyes: Secondary | ICD-10-CM | POA: Diagnosis present

## 2015-07-29 DIAGNOSIS — Z66 Do not resuscitate: Secondary | ICD-10-CM | POA: Diagnosis present

## 2015-07-29 DIAGNOSIS — Z79899 Other long term (current) drug therapy: Secondary | ICD-10-CM

## 2015-07-29 DIAGNOSIS — G44209 Tension-type headache, unspecified, not intractable: Secondary | ICD-10-CM | POA: Diagnosis present

## 2015-07-29 DIAGNOSIS — E785 Hyperlipidemia, unspecified: Secondary | ICD-10-CM | POA: Diagnosis present

## 2015-07-29 DIAGNOSIS — H3530 Unspecified macular degeneration: Secondary | ICD-10-CM | POA: Diagnosis present

## 2015-07-29 HISTORY — DX: Cerebral infarction, unspecified: I63.9

## 2015-07-29 HISTORY — DX: Primary biliary cirrhosis: K74.3

## 2015-07-29 LAB — CBC
HCT: 32.9 % — ABNORMAL LOW (ref 35.0–47.0)
HEMOGLOBIN: 10.5 g/dL — AB (ref 12.0–16.0)
MCH: 27 pg (ref 26.0–34.0)
MCHC: 32 g/dL (ref 32.0–36.0)
MCV: 84.4 fL (ref 80.0–100.0)
PLATELETS: 309 10*3/uL (ref 150–440)
RBC: 3.89 MIL/uL (ref 3.80–5.20)
RDW: 17.6 % — ABNORMAL HIGH (ref 11.5–14.5)
WBC: 6.3 10*3/uL (ref 3.6–11.0)

## 2015-07-29 LAB — BASIC METABOLIC PANEL
Anion gap: 7 (ref 5–15)
BUN: 11 mg/dL (ref 6–20)
CO2: 23 mmol/L (ref 22–32)
CREATININE: 0.68 mg/dL (ref 0.44–1.00)
Calcium: 9.2 mg/dL (ref 8.9–10.3)
Chloride: 96 mmol/L — ABNORMAL LOW (ref 101–111)
Glucose, Bld: 106 mg/dL — ABNORMAL HIGH (ref 65–99)
POTASSIUM: 4.5 mmol/L (ref 3.5–5.1)
SODIUM: 126 mmol/L — AB (ref 135–145)

## 2015-07-29 LAB — TROPONIN I: TROPONIN I: 0.06 ng/mL — AB (ref <0.031)

## 2015-07-29 MED ORDER — SODIUM CHLORIDE 0.9 % IV BOLUS (SEPSIS)
500.0000 mL | Freq: Once | INTRAVENOUS | Status: AC
  Administered 2015-07-30: 500 mL via INTRAVENOUS

## 2015-07-29 NOTE — ED Notes (Addendum)
Pt bib EMS w/ c/o HTN from home.  Pt recently DC from hospice for heart issues.  Pt does have hx of stroke and bilat blindness. Per EMS, BP 212/90.  Pt denies CP, SOB, n/v/d or LOC

## 2015-07-30 ENCOUNTER — Encounter: Payer: Self-pay | Admitting: Internal Medicine

## 2015-07-30 DIAGNOSIS — I080 Rheumatic disorders of both mitral and aortic valves: Secondary | ICD-10-CM | POA: Diagnosis present

## 2015-07-30 DIAGNOSIS — I2510 Atherosclerotic heart disease of native coronary artery without angina pectoris: Secondary | ICD-10-CM | POA: Diagnosis present

## 2015-07-30 DIAGNOSIS — I482 Chronic atrial fibrillation: Secondary | ICD-10-CM | POA: Diagnosis present

## 2015-07-30 DIAGNOSIS — I110 Hypertensive heart disease with heart failure: Secondary | ICD-10-CM | POA: Diagnosis present

## 2015-07-30 DIAGNOSIS — G44209 Tension-type headache, unspecified, not intractable: Secondary | ICD-10-CM | POA: Diagnosis present

## 2015-07-30 DIAGNOSIS — Z7901 Long term (current) use of anticoagulants: Secondary | ICD-10-CM | POA: Diagnosis not present

## 2015-07-30 DIAGNOSIS — Z87891 Personal history of nicotine dependence: Secondary | ICD-10-CM | POA: Diagnosis not present

## 2015-07-30 DIAGNOSIS — Z888 Allergy status to other drugs, medicaments and biological substances status: Secondary | ICD-10-CM | POA: Diagnosis not present

## 2015-07-30 DIAGNOSIS — H540 Blindness, both eyes: Secondary | ICD-10-CM | POA: Diagnosis present

## 2015-07-30 DIAGNOSIS — Z7952 Long term (current) use of systemic steroids: Secondary | ICD-10-CM | POA: Diagnosis not present

## 2015-07-30 DIAGNOSIS — I959 Hypotension, unspecified: Secondary | ICD-10-CM | POA: Diagnosis present

## 2015-07-30 DIAGNOSIS — E871 Hypo-osmolality and hyponatremia: Secondary | ICD-10-CM | POA: Diagnosis present

## 2015-07-30 DIAGNOSIS — Z66 Do not resuscitate: Secondary | ICD-10-CM | POA: Diagnosis present

## 2015-07-30 DIAGNOSIS — E785 Hyperlipidemia, unspecified: Secondary | ICD-10-CM | POA: Diagnosis present

## 2015-07-30 DIAGNOSIS — I69351 Hemiplegia and hemiparesis following cerebral infarction affecting right dominant side: Secondary | ICD-10-CM | POA: Diagnosis not present

## 2015-07-30 DIAGNOSIS — I5032 Chronic diastolic (congestive) heart failure: Secondary | ICD-10-CM | POA: Diagnosis present

## 2015-07-30 DIAGNOSIS — K743 Primary biliary cirrhosis: Secondary | ICD-10-CM | POA: Diagnosis present

## 2015-07-30 DIAGNOSIS — Z882 Allergy status to sulfonamides status: Secondary | ICD-10-CM | POA: Diagnosis not present

## 2015-07-30 DIAGNOSIS — H3530 Unspecified macular degeneration: Secondary | ICD-10-CM | POA: Diagnosis present

## 2015-07-30 DIAGNOSIS — Z7982 Long term (current) use of aspirin: Secondary | ICD-10-CM | POA: Diagnosis not present

## 2015-07-30 DIAGNOSIS — E861 Hypovolemia: Secondary | ICD-10-CM | POA: Diagnosis present

## 2015-07-30 DIAGNOSIS — N390 Urinary tract infection, site not specified: Secondary | ICD-10-CM | POA: Diagnosis present

## 2015-07-30 DIAGNOSIS — Z79899 Other long term (current) drug therapy: Secondary | ICD-10-CM | POA: Diagnosis not present

## 2015-07-30 DIAGNOSIS — Z951 Presence of aortocoronary bypass graft: Secondary | ICD-10-CM | POA: Diagnosis not present

## 2015-07-30 DIAGNOSIS — Z944 Liver transplant status: Secondary | ICD-10-CM | POA: Diagnosis not present

## 2015-07-30 DIAGNOSIS — B029 Zoster without complications: Secondary | ICD-10-CM | POA: Diagnosis present

## 2015-07-30 LAB — TSH: TSH: 5.281 u[IU]/mL — ABNORMAL HIGH (ref 0.350–4.500)

## 2015-07-30 LAB — SODIUM
Sodium: 130 mmol/L — ABNORMAL LOW (ref 135–145)
Sodium: 124 mmol/L — ABNORMAL LOW (ref 135–145)
Sodium: 129 mmol/L — ABNORMAL LOW (ref 135–145)
Sodium: 129 mmol/L — ABNORMAL LOW (ref 135–145)

## 2015-07-30 MED ORDER — EZETIMIBE 10 MG PO TABS
10.0000 mg | ORAL_TABLET | ORAL | Status: DC
  Administered 2015-08-01: 10 mg via ORAL
  Filled 2015-07-30: qty 1

## 2015-07-30 MED ORDER — ACETAMINOPHEN 325 MG PO TABS
ORAL_TABLET | ORAL | Status: AC
  Administered 2015-07-30: 650 mg via ORAL
  Filled 2015-07-30: qty 2

## 2015-07-30 MED ORDER — MIRTAZAPINE 15 MG PO TABS
7.5000 mg | ORAL_TABLET | Freq: Every day | ORAL | Status: DC
  Administered 2015-07-30 – 2015-07-31 (×2): 7.5 mg via ORAL
  Filled 2015-07-30 (×2): qty 1

## 2015-07-30 MED ORDER — GABAPENTIN 100 MG PO CAPS
100.0000 mg | ORAL_CAPSULE | Freq: Two times a day (BID) | ORAL | Status: DC
Start: 2015-07-30 — End: 2015-08-01
  Administered 2015-07-30 – 2015-08-01 (×5): 100 mg via ORAL
  Filled 2015-07-30 (×5): qty 1

## 2015-07-30 MED ORDER — HYDRALAZINE HCL 20 MG/ML IJ SOLN
INTRAMUSCULAR | Status: AC
  Filled 2015-07-30: qty 1

## 2015-07-30 MED ORDER — CRANBERRY 250 MG PO TABS: 250.0000 mg | ORAL_TABLET | Freq: Two times a day (BID) | ORAL | Status: DC

## 2015-07-30 MED ORDER — SODIUM CHLORIDE 0.9% FLUSH
3.0000 mL | Freq: Two times a day (BID) | INTRAVENOUS | Status: DC
  Administered 2015-07-30: 3 mL via INTRAVENOUS

## 2015-07-30 MED ORDER — ACETAMINOPHEN 325 MG PO TABS
650.0000 mg | ORAL_TABLET | Freq: Once | ORAL | Status: AC
  Administered 2015-07-30: 650 mg via ORAL

## 2015-07-30 MED ORDER — NITROFURANTOIN MONOHYD MACRO 100 MG PO CAPS
100.0000 mg | ORAL_CAPSULE | Freq: Every day | ORAL | Status: DC
  Administered 2015-07-30 – 2015-08-01 (×3): 100 mg via ORAL
  Filled 2015-07-30 (×3): qty 1

## 2015-07-30 MED ORDER — EZETIMIBE-SIMVASTATIN 10-20 MG PO TABS: 1.0000 | ORAL_TABLET | ORAL | Status: DC

## 2015-07-30 MED ORDER — ACETAMINOPHEN 325 MG PO TABS
650.0000 mg | ORAL_TABLET | Freq: Four times a day (QID) | ORAL | Status: DC | PRN
  Administered 2015-07-30 – 2015-07-31 (×4): 650 mg via ORAL
  Filled 2015-07-30 (×4): qty 2

## 2015-07-30 MED ORDER — RIVAROXABAN 15 MG PO TABS
15.0000 mg | ORAL_TABLET | Freq: Every day | ORAL | Status: DC
  Administered 2015-07-30 – 2015-07-31 (×2): 15 mg via ORAL
  Filled 2015-07-30 (×2): qty 1

## 2015-07-30 MED ORDER — IBUPROFEN 600 MG PO TABS
ORAL_TABLET | ORAL | Status: AC
  Filled 2015-07-30: qty 1

## 2015-07-30 MED ORDER — PREDNISONE 10 MG PO TABS
5.0000 mg | ORAL_TABLET | Freq: Every day | ORAL | Status: DC
  Administered 2015-07-30 – 2015-08-01 (×3): 5 mg via ORAL
  Filled 2015-07-30 (×3): qty 1

## 2015-07-30 MED ORDER — SODIUM CHLORIDE 0.9 % IV SOLN
INTRAVENOUS | Status: DC
  Administered 2015-07-30 – 2015-08-01 (×5): via INTRAVENOUS

## 2015-07-30 MED ORDER — TRAZODONE HCL 50 MG PO TABS
150.0000 mg | ORAL_TABLET | Freq: Every day | ORAL | Status: DC
  Administered 2015-07-30 – 2015-07-31 (×2): 150 mg via ORAL
  Filled 2015-07-30 (×2): qty 1

## 2015-07-30 MED ORDER — IBUPROFEN 600 MG PO TABS
600.0000 mg | ORAL_TABLET | Freq: Once | ORAL | Status: AC
  Administered 2015-07-30: 600 mg via ORAL

## 2015-07-30 MED ORDER — HYDRALAZINE HCL 20 MG/ML IJ SOLN
10.0000 mg | Freq: Four times a day (QID) | INTRAMUSCULAR | Status: DC | PRN
  Administered 2015-07-30: 10 mg via INTRAVENOUS

## 2015-07-30 MED ORDER — SENNOSIDES-DOCUSATE SODIUM 8.6-50 MG PO TABS
3.0000 | ORAL_TABLET | Freq: Every day | ORAL | Status: DC
  Administered 2015-07-30 – 2015-07-31 (×2): 3 via ORAL
  Filled 2015-07-30 (×2): qty 3

## 2015-07-30 MED ORDER — VALACYCLOVIR HCL 500 MG PO TABS
1000.0000 mg | ORAL_TABLET | Freq: Every evening | ORAL | Status: DC
  Administered 2015-07-30 – 2015-07-31 (×2): 1000 mg via ORAL
  Filled 2015-07-30 (×2): qty 2

## 2015-07-30 MED ORDER — METOPROLOL TARTRATE 25 MG PO TABS
25.0000 mg | ORAL_TABLET | Freq: Two times a day (BID) | ORAL | Status: DC
  Administered 2015-07-30 (×2): 25 mg via ORAL
  Filled 2015-07-30 (×2): qty 1

## 2015-07-30 MED ORDER — ACETAMINOPHEN 650 MG RE SUPP: 650.0000 mg | Freq: Four times a day (QID) | RECTAL | Status: DC | PRN

## 2015-07-30 MED ORDER — ONDANSETRON HCL 4 MG PO TABS: 4.0000 mg | ORAL_TABLET | Freq: Four times a day (QID) | ORAL | Status: DC | PRN

## 2015-07-30 MED ORDER — SIMVASTATIN 20 MG PO TABS
20.0000 mg | ORAL_TABLET | ORAL | Status: DC
  Administered 2015-08-01: 20 mg via ORAL
  Filled 2015-07-30: qty 1

## 2015-07-30 MED ORDER — RIVAROXABAN 20 MG PO TABS: 20.0000 mg | ORAL_TABLET | Freq: Every day | ORAL | Status: DC

## 2015-07-30 MED ORDER — MYCOPHENOLATE MOFETIL 250 MG PO CAPS
500.0000 mg | ORAL_CAPSULE | Freq: Two times a day (BID) | ORAL | Status: DC
  Administered 2015-07-30 – 2015-08-01 (×5): 500 mg via ORAL
  Filled 2015-07-30 (×5): qty 2

## 2015-07-30 MED ORDER — ONDANSETRON HCL 4 MG/2ML IJ SOLN: 4.0000 mg | Freq: Four times a day (QID) | INTRAMUSCULAR | Status: DC | PRN

## 2015-07-30 MED ORDER — ASPIRIN EC 81 MG PO TBEC
81.0000 mg | DELAYED_RELEASE_TABLET | Freq: Every day | ORAL | Status: DC
  Administered 2015-07-30 – 2015-08-01 (×3): 81 mg via ORAL
  Filled 2015-07-30 (×3): qty 1

## 2015-07-30 MED ORDER — MORPHINE SULFATE (PF) 2 MG/ML IV SOLN: 1.0000 mg | INTRAVENOUS | Status: DC | PRN

## 2015-07-30 NOTE — Progress Notes (Signed)
[facility] [place] NAME: Brenda Roman    MR#:  [id number]  DATE OF BIRTH:  December 27, 1923  SUBJECTIVE:  CHIEF COMPLAINT:   Chief Complaint  Patient presents with  . Hypertension   Headache is better. But has weakness. REVIEW OF SYSTEMS:  CONSTITUTIONAL: No fever, has generalized weakness.  EYES: No blurred or double vision.  EARS, NOSE, AND THROAT: No tinnitus or ear pain.  RESPIRATORY: No cough, shortness of breath, wheezing or hemoptysis.  CARDIOVASCULAR: No chest pain, orthopnea, edema.  GASTROINTESTINAL: No nausea, vomiting, diarrhea or abdominal pain.  GENITOURINARY: No dysuria, hematuria.  ENDOCRINE: No polyuria, nocturia,  HEMATOLOGY: No anemia, easy bruising or bleeding SKIN: No rash or lesion. MUSCULOSKELETAL: No joint pain or arthritis.   NEUROLOGIC: No tingling, numbness, weakness.  PSYCHIATRY: No anxiety or depression.   DRUG ALLERGIES:   Allergies  Allergen Reactions  . Cephalexin Other (See Comments)    Reaction:  Unknown  . Novocain [Procaine] Rash  . Sulfa Antibiotics Rash    VITALS:  Blood pressure 182/85, pulse 76, temperature 98 F (36.7 C), temperature source Oral, resp. rate 16, height 5\' 2"  (1.575 m), weight 64.003 kg (141 lb 1.6 oz), SpO2 98 %.  PHYSICAL EXAMINATION:  GENERAL:  82 y.o.-year-old patient lying in the bed with no acute distress. But looks frgail. EYES: Pupils equal, round, reactive to light and accommodation. No scleral icterus. Extraocular muscles intact.  HEENT: Head atraumatic, normocephalic. Oropharynx and nasopharynx clear.  NECK:  Supple, no jugular venous distention. No thyroid enlargement, no tenderness.  LUNGS: Normal breath sounds bilaterally, no wheezing, rales,rhonchi or crepitation. No use of accessory muscles of respiration.  CARDIOVASCULAR: S1, S2 normal. No murmurs, rubs, or gallops.  ABDOMEN: Soft, nontender, nondistended. Bowel sounds present. No organomegaly or  mass.  EXTREMITIES: No pedal edema, cyanosis, or clubbing.  NEUROLOGIC: Cranial nerves II through XII are intact. Muscle strength 4/5 in all extremities except right leg which is paralyzed per her husband. Sensation intact. Gait not checked.  PSYCHIATRIC: The patient is alert and oriented x 3.  SKIN: No obvious rash, lesion, or ulcer.    LABORATORY PANEL:   CBC  Recent Labs Lab 07/29/15 2217  WBC 6.3  HGB 10.5*  HCT 32.9*  PLT 309   ------------------------------------------------------------------------------------------------------------------  Chemistries   Recent Labs Lab 07/29/15 2217  07/30/15 1102  NA 126*  < > 124*  K 4.5  --   --   CL 96*  --   --   CO2 23  --   --   GLUCOSE 106*  --   --   BUN 11  --   --   CREATININE 0.68  --   --   CALCIUM 9.2  --   --   < > = values in this interval not displayed. ------------------------------------------------------------------------------------------------------------------  Cardiac Enzymes  Recent Labs Lab 07/29/15 2217  TROPONINI 0.06*   ------------------------------------------------------------------------------------------------------------------  RADIOLOGY:  No results found.  EKG:   Orders placed or performed during the hospital encounter of 07/29/15  . ED EKG  . ED EKG    ASSESSMENT AND PLAN:   This is an 82 year old female admitted for hyponatremia.  1. Hyponatremia: Likely secondary to hypovolemia.  Worsening to 124 this am, continue NS iv , regular diet (she was on a low-sodium diet). Check sodium every 6 hours.  2. Accelerated Hypertension:  Continue metoprolol and add hydralazine iv as needed.  3. Headache: better.  4. Blindness: Secondary to  shingles; no active lesions. Continue suppressive therapy with Valtrex  5. Chronic Atrial fibrillation,  continue Xarelto and aspirin   6. Liver transplant: continue Cellcept and prednisone  7. Recurrent urinary tract infection: Continue  Macrobid prophylaxis  8. History of CAD and CVA. On Xarelto and aspirin   9. Chronic diastolic CHF. Stable. Watch for overload.  All the records are reviewed and case discussed with Care Management/Social Workerr. Management plans discussed with the patient, her husband and they are in agreement.  CODE STATUS: DNR  TOTAL TIME TAKING CARE OF THIS PATIENT: 37 minutes.  Greater than 50% time was spent on coordination of care and face-to-face counseling.  POSSIBLE D/C IN 2-3 DAYS, DEPENDING ON CLINICAL CONDITION.   Demetrios Loll M.D on 07/30/2015 at 3:34 PM  Between 7am to 6pm - Pager - [phone]  After 6pm go to [email or web address] - password [email or web address] [facility]  Office  [phone]  CC: Primary care physician; Kirk Ruths., MD

## 2015-07-30 NOTE — Progress Notes (Signed)
While RN was in the room with patient, patient became suddenly disoriented with flushed cheeks and eyes rolling back. Patient repeatedly started saying "I dont feel right. Something is really wrong". She also complained of headache. Vitals obtained. Pulse 71, O2 sats 98% and BP 184/85 MD notified, and hydralazine 10mg  IV PRN ordered and given to patient. 20 minutes later, patient states she "feels a little better" but still has a headache. Tylenol given.  *One hour later, 4:40 pm Patient is emotional, crying and anxious. The patient and her family members state that she has not slept for over 24 hours, since 7am April 7th. i offered relaxation techniques, changing positions, but patient appears to be fighting sleep.  Last BP 150/64

## 2015-07-30 NOTE — Progress Notes (Signed)
Patient is alert and oriented x 4 but sometimes forgetful. Admitted to room 239 from  ER via stretcher with a diagnosis of hyponatremia.  Patient oriented to her room, staff, call bell/ascom. [facility] Neurosurgeon and password obtained. Tele box verified by Tiffany RN and Daniell NT. Patient is blind in both eyes and HOH on bilateral ears. Denied any pain. No acute respiratory distress noted. Will continue to monitor.

## 2015-07-30 NOTE — H&P (Signed)
Brenda Roman is an 82 y.o. female.   Chief Complaint: Headache HPI: The patient presents emergency department complaining of a left parietal headache that has been present for at least 8 hours. 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 Her husband reports that the patient's blood pressure has been high most of the day. Her blood pressure usually runs in the 140s but was greater than 200 prior to arrival in the emergency department. Apparently, the patient had been on hospice care until a few weeks ago. While under their care her metoprolol had been discontinued. In the emergency department the patient's blood pressure was found to be mildly elevated but she was also found to have a sodium of 126 which prompted the emergency department staff to call for admission.  Past Medical History  Diagnosis Date  . Liver transplanted ([place])   . Macular degeneration   . Coronary artery disease   . Hypertension   . Shingles   . CHF (congestive heart failure) ([place])   . TIA (transient ischemic attack)   . 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 Aortic stenosis   . Mitral valve disease   . PBC (primary biliary cirrhosis) ([place])     s/p liver transplant  . CVA (cerebral infarction) June '16    Past Surgical History  Procedure Laterality Date  . Coronary artery bypass graft    . Joint replacement    . Bowel resection    . Colonoscopy    . Liver transplant  1990    Family History  Problem Relation Age of Onset  . Hypertension Father    Social History:  reports that she has quit smoking. She does not have any smokeless tobacco history on file. She reports that she does not drink alcohol or use illicit drugs.  Allergies:  Allergies  Allergen Reactions  . Cephalexin Other (See Comments)    Reaction:  Unknown  . Novocain [Procaine] Rash  . Sulfa Antibiotics  Rash    Prior to Admission medications   Medication Sig Start Date End Date Taking? Authorizing Provider  aspirin EC 81 MG tablet Take 81 mg by mouth daily.   Yes Historical Provider, MD  Cranberry 250 MG TABS Take 250 mg by mouth 2 (two) times daily.   Yes Historical Provider, MD  ezetimibe-simvastatin (VYTORIN) 10-20 MG tablet Take 1 tablet by mouth 3 (three) times a week. Patient takes on Monday, Wednesday, Friday   Yes Historical Provider, MD  gabapentin (NEURONTIN) 100 MG capsule Take 100 mg by mouth 2 (two) times daily.    Yes Historical Provider, MD  mirtazapine (REMERON) 7.5 MG tablet Take 7.5 mg by mouth at bedtime.   Yes Historical Provider, MD  mycophenolate (CELLCEPT) 500 MG tablet Take 500 mg by mouth every 12 (twelve) hours.   Yes Historical Provider, MD  nitrofurantoin, macrocrystal-monohydrate, (MACROBID) 100 MG capsule Take 1 capsule (100 mg total) by mouth daily. 04/07/15  Yes Roda Shutters, FNP  predniSONE (DELTASONE) 5 MG tablet Take 5 mg by mouth daily.   Yes Historical Provider, MD  rivaroxaban (XARELTO) 20 MG TABS tablet Take 20 mg by mouth daily with supper.   Yes Historical Provider, MD  senna-docusate (SENOKOT-S) 8.6-50 MG per tablet Take 3 tablets by mouth at bedtime.    Yes Historical Provider, MD  traZODone (DESYREL) 100 MG tablet Take 150 mg by mouth at  bedtime.   Yes Historical Provider, MD  valACYclovir (VALTREX) 1000 MG tablet Take 1,000 mg by mouth every evening.    Yes Historical Provider, MD     Results for orders placed or performed during the hospital encounter of 07/29/15 (from the past 48 hour(s))  CBC     Status: Abnormal   Collection Time: 07/29/15 10:17 PM  Result Value Ref Range   WBC 6.3 3.6 - 11.0 K/uL   RBC 3.89 3.80 - 5.20 MIL/uL   Hemoglobin 10.5 (L) 12.0 - 16.0 g/dL   HCT 32.9 (L) 35.0 - 47.0 %   MCV 84.4 80.0 - 100.0 fL   MCH 27.0 26.0 - 34.0 pg   MCHC 32.0 32.0 - 36.0 g/dL   RDW 17.6 (H) 11.5 - 14.5 %   Platelets 309 150 - 440  K/uL  Basic metabolic panel     Status: Abnormal   Collection Time: 07/29/15 10:17 PM  Result Value Ref Range   Sodium 126 (L) 135 - 145 mmol/L   Potassium 4.5 3.5 - 5.1 mmol/L   Chloride 96 (L) 101 - 111 mmol/L   CO2 23 22 - 32 mmol/L   Glucose, Bld 106 (H) 65 - 99 mg/dL   BUN 11 6 - 20 mg/dL   Creatinine, Ser 0.68 0.44 - 1.00 mg/dL   Calcium 9.2 8.9 - 10.3 mg/dL   GFR calc non Af Amer >60 >60 mL/min   GFR calc Af Amer >60 >60 mL/min    Comment: (NOTE) The eGFR has been calculated using the CKD EPI equation. This calculation has not been validated in all clinical situations. eGFR's persistently <60 mL/min signify possible Chronic Kidney Disease.    Anion gap 7 5 - 15  Troponin I     Status: Abnormal   Collection Time: 07/29/15 10:17 PM  Result Value Ref Range   Troponin I 0.06 (H) <0.031 ng/mL    Comment: READ BACK AND VERIFIED WITH NELLIE MONAR AT 2244 07/29/15.PMH        PERSISTENTLY INCREASED TROPONIN VALUES IN THE RANGE OF 0.04-0.49 ng/mL CAN BE SEEN IN:       -UNSTABLE ANGINA       -CONGESTIVE HEART FAILURE       -MYOCARDITIS       -CHEST TRAUMA       -ARRYHTHMIAS       -LATE PRESENTING MYOCARDIAL INFARCTION       -COPD   CLINICAL FOLLOW-UP RECOMMENDED.    No results found.  Review of Systems  Constitutional: Negative for fever and chills.  HENT: Negative for sore throat and tinnitus.   Eyes: Negative for blurred vision and redness.  Respiratory: Negative for cough and shortness of breath.   Cardiovascular: Negative for chest pain, palpitations, orthopnea and PND.  Gastrointestinal: Negative for nausea, vomiting, abdominal pain and diarrhea.  Genitourinary: Negative for dysuria, urgency and frequency.  Musculoskeletal: Negative for myalgias and joint pain.  Skin: Negative for rash.       No lesions  Neurological: Positive for headaches. Negative for speech change, focal weakness and weakness.  Endo/Heme/Allergies: Does not bruise/bleed easily.       No  temperature intolerance  Psychiatric/Behavioral: Negative for depression and suicidal ideas.    Blood pressure 173/81, pulse 84, temperature 97.8 F (36.6 C), temperature source Oral, resp. rate 16, height 5' 2"  (1.575 m), weight 64.003 kg (141 lb 1.6 oz), SpO2 99 %. Physical Exam  Vitals reviewed. Constitutional: She is oriented to person, place, and time.  She appears well-developed and well-nourished.  HENT:  Head: Normocephalic and atraumatic.  Eyes: EOM are normal. Pupils are equal, round, and reactive to light. No scleral icterus.  Neck: Normal range of motion. Neck supple. No JVD present. No tracheal deviation present. No thyromegaly present.  Cardiovascular: Normal rate, regular rhythm and normal heart sounds.  Exam reveals no gallop and no friction rub.   No murmur heard. Respiratory: Effort normal and breath sounds normal.  GI: Soft. Bowel sounds are normal. She exhibits no distension. There is no tenderness.  Genitourinary:  Deferred  Musculoskeletal: Normal range of motion. She exhibits no edema.  Lymphadenopathy:    She has no cervical adenopathy.  Neurological: She is alert and oriented to person, place, and time. No cranial nerve deficit. She exhibits normal muscle tone.  Skin: Skin is warm and dry. No rash noted. No erythema.  Psychiatric: She has a normal mood and affect. Her behavior is normal. Judgment and thought content normal.     Assessment/Plan This is an 82 year old female admitted for hyponatremia. 1. Hyponatremia: Likely secondary to hypovolemia. The patient states that she has not been able to eat much over the last few days and particularly this afternoon due to her headache. She denies fever, viral symptoms, or diarrhea. I started her on normal saline and we will encourage oral intake. I have also started her on a regular diet (she was on a low-sodium diet). Check sodium every 6 hours. 2. Hypertension: Uncontrolled; may be due to discontinuation of her beta  blocker. I will start her on a low-dose of metoprolol and titrate as needed. 3. Headache: Temporal parietal but likely tension headache as it developed after the patient was at the hairdresser and sitting under dryer today. Manage blood pressure and liver analgesic as necessary. Differential diagnosis includes temporal arteritis but this is unlikely as the patient is on immunosuppressants due to her liver transplant. 4. 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 Continue suppressive therapy with Valtrex 5. Valvular disease: continue Xarelto 6. Liver transplant: continue Cellcept and prednisone 7. Recurrent urinary tract infection: Continue Macrobid prophylaxis 8. DVT prophylaxis: full anticoagulation as above 9. GI prophylaxis: none The patient is a DO NOT RESUSCITATE. Time spent on admission was inpatient care approximately 45 minutes  Harrie Foreman, MD 07/30/2015, 5:52 AM

## 2015-07-30 NOTE — ED Provider Notes (Addendum)
[facility] Emergency Department Provider Note  ____________________________________________  Time seen: Approximately 2341 PM  I have reviewed the triage vital signs and the nursing notes.   HISTORY  Chief Complaint Hypertension    HPI Brenda Roman is a 82 y.o. female who comes into the hospital today with high blood pressure. The patient has been reports that the highest blood pressure she had at home was 214/94. The patient had gone to get her hair done today around 3:30 this afternoon. It took about 2 hours and the patient was exhausted when she got home. The patient's husband reports that he gave her some water but she just didn't seem to be doing well. She was not feeling well though he could not explain what was going on. He reports that he checked her blood pressure and it was in the 200s over 90s. He reports that he then continued to check it every 30 minutes for the next 3 hours. When she came home it seems as though her blood pressure went up and then down and then back up again. The patient was taken off of her blood pressure medicine approximately 3 weeks ago and has not restarted it. The patient was also on hospice but was taken off. She has been put on a low-sodium diet and her son reports that the last time that happened she ended up having low sodium and he is concerned that may be a cause. The patient denies any chest pain or shortness of breath but she does have a mild headache that is a 5 out of 10 in intensity. She reports though that she does feel better than she did before she came into the hospital. The patient does not have any dizziness as well. The family though wanted her evaluated which is why they brought her into the hospital.   Past Medical History  Diagnosis Date  . Liver transplanted ([place])   . Macular degeneration   . Coronary artery disease   . Hypertension   . Shingles   . CHF (congestive heart failure) ([place])   . TIA (transient  ischemic attack)   . Blind   . Aortic stenosis   . Mitral valve disease     Patient Active Problem List   Diagnosis Date Noted  . Acute CHF (congestive heart failure) ([place]) 07/18/2015  . 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 Closed wedge fracture of lumbar vertebra ([place]) 10/11/2014  . Ischemic stroke ([place]) 10/07/2014  . Acute urinary tract infection 10/07/2014  . 2Nd nerve palsy 10/07/2014  . Closed fracture of humerus, surgical neck 09/29/2014  . Goals of care, counseling/discussion   . Acute encephalopathy 08/29/2014  . Hypoxia 08/29/2014  . Healthcare-associated pneumonia 08/29/2014  . Pneumonia 08/29/2014  . CVA (cerebral infarction) 08/23/2014  . Arteriosclerosis of coronary artery 03/15/2014  . Paroxysmal atrial fibrillation ([place]) 03/15/2014  . Atherosclerosis of abdominal aorta ([place]) 01/27/2014  . HLD (hyperlipidemia) 04/07/2012  . BP (high blood pressure) 04/07/2012  . History of liver transplant ([place]) 04/03/2012  . Encounter for other procedures for purposes other than remedying health state 04/03/2012  . Prolapse of urethra 03/10/2012  . Pelvic relaxation due to uterovaginal prolapse 03/10/2012    Past Surgical History  Procedure Laterality Date  . Coronary artery bypass graft    . Joint replacement    . Abdominal surgery    . Colonoscopy      Current Outpatient Rx  Name  Route  Sig  Dispense  Refill  .  aspirin EC 81 MG tablet   Oral   Take 81 mg by mouth daily.         . Cranberry 250 MG TABS   Oral   Take 250 mg by mouth 2 (two) times daily.         Marland Kitchen ezetimibe-simvastatin (VYTORIN) 10-20 MG tablet   Oral   Take 1 tablet by mouth daily. Patient takes Monday, Wednesday, and Friday         . gabapentin (NEURONTIN) 100 MG capsule   Oral   Take 100 mg by mouth 2 (two) times daily.          . mirtazapine (REMERON) 15 MG tablet   Oral   Take 1 tablet (15 mg total) by mouth at bedtime.   30 tablet   1   . mycophenolate (CELLCEPT)  500 MG tablet   Oral   Take 500 mg by mouth every 12 (twelve) hours.         . nitrofurantoin, macrocrystal-monohydrate, (MACROBID) 100 MG capsule   Oral   Take 1 capsule (100 mg total) by mouth daily.   30 capsule   3   . nitroGLYCERIN (NITROSTAT) 0.4 MG SL tablet   Sublingual   Place 0.4 mg under the tongue every 5 (five) minutes as needed for chest pain.         . predniSONE (DELTASONE) 5 MG tablet   Oral   Take 5 mg by mouth daily.         . rivaroxaban (XARELTO) 20 MG TABS tablet   Oral   Take 20 mg by mouth daily with supper.         . senna-docusate (SENOKOT-S) 8.6-50 MG per tablet   Oral   Take 2 tablets by mouth daily.         . traZODone (DESYREL) 100 MG tablet   Oral   Take 150 mg by mouth at bedtime.         . valACYclovir (VALTREX) 1000 MG tablet   Oral   Take 1,000 mg by mouth daily.            Allergies Cephalexin; Novocain; and Sulfa antibiotics  Family History  Problem Relation Age of Onset  . Hypertension Father     Social History Social History  Substance Use Topics  . Smoking status: Former Research scientist (life sciences)  . Smokeless tobacco: None  . Alcohol Use: No    Review of Systems Constitutional: Unwell feeling and hypertension Eyes: No visual changes. ENT: No sore throat. Cardiovascular: Denies chest pain. Respiratory: Denies shortness of breath. Gastrointestinal: No abdominal pain.  No nausea, no vomiting.  No diarrhea.  No constipation. Genitourinary: Negative for dysuria. Musculoskeletal: Negative for back pain. Skin: Negative for rash. Neurological: Negative for headaches, focal weakness or numbness.  10-point ROS otherwise negative.  ____________________________________________   PHYSICAL EXAM:  VITAL SIGNS: ED Triage Vitals  Enc Vitals Group     BP 07/29/15 2329 154/77 mmHg     Pulse Rate 07/29/15 2209 87     Resp 07/29/15 2209 14     Temp 07/29/15 2209 98.2 F (36.8 C)     Temp Source 07/29/15 2209 Oral     SpO2  07/29/15 2209 99 %     Weight 07/29/15 2209 22 lb (9.979 kg)     Height 07/29/15 2339 5\' 2"  (1.575 m)     Head Cir --      Peak Flow --      Pain Score  07/29/15 2213 0     Pain Loc --      Pain Edu? --      Excl. in [place]? --     Constitutional: Alert and oriented. Well appearing and in no acute distress. Eyes: Conjunctivae are normal. Head: Atraumatic. Nose: No congestion/rhinnorhea. Mouth/Throat: Mucous membranes are moist.  Oropharynx non-erythematous. Cardiovascular: Normal rate, regular rhythm. Holosystolic murmur.  Good peripheral circulation. Respiratory: Normal respiratory effort.  No retractions. Lungs CTAB. Gastrointestinal: Soft and nontender. No distention. Positive bowel sounds Musculoskeletal: No lower extremity tenderness nor edema.   Neurologic:  Normal speech and language. Cranial nerves II through XII are grossly intact with no focal motor or neuro deficits Skin:  Skin is warm, dry and intact.  Psychiatric: Mood and affect are normal.  ____________________________________________   LABS (all labs ordered are listed, but only abnormal results are displayed)  Labs Reviewed  CBC - Abnormal; Notable for the following:    Hemoglobin 10.5 (*)    HCT 32.9 (*)    RDW 17.6 (*)    All other components within normal limits  BASIC METABOLIC PANEL - Abnormal; Notable for the following:    Sodium 126 (*)    Chloride 96 (*)    Glucose, Bld 106 (*)    All other components within normal limits  TROPONIN I - Abnormal; Notable for the following:    Troponin I 0.06 (*)    All other components within normal limits   ____________________________________________  EKG  ED ECG REPORT I, Loney Hering, the attending physician, personally viewed and interpreted this ECG.   Date: 07/29/2015  EKG Time: 2212  Rate: 87  Rhythm: normal sinus rhythm, LBBB  Axis: normal  Intervals:left bundle branch block  ST&T Change: LBBB seen on previous  EKG  ____________________________________________  RADIOLOGY  None ____________________________________________   PROCEDURES  Procedure(s) performed: None  Critical Care performed: No  ____________________________________________   INITIAL IMPRESSION / ASSESSMENT AND PLAN / ED COURSE  Pertinent labs & imaging results that were available during my care of the patient were reviewed by me and considered in my medical decision making (see chart for details).  This is an 82 year old female who comes in to the hospital with elevated blood pressure and not feeling well. We did check some blood work and appears so the patient has a low sodium. I will give the patient 500 mL bolus of normal saline and I will admit her to the hospital. The patient's blood pressure when she arrived though his improved at 150s over 70s. The patient also has no other complaints. ____________________________________________   FINAL CLINICAL IMPRESSION(S) / ED DIAGNOSES  Final diagnoses:  Hyponatremia      Loney Hering, MD 07/30/15 0131  Loney Hering, MD 07/30/15 [phone]

## 2015-07-30 NOTE — Discharge Instructions (Signed)
Heart healthy diet. Activity as tolerated. Resume HHPT.

## 2015-07-30 NOTE — Progress Notes (Addendum)
Pharmacy note.  Rivaroxaban changed to 15 mg daily for patient with PAF and CrCl [id number] per policy A999333.

## 2015-07-30 NOTE — Progress Notes (Signed)
PHARMACIST - PHYSICIAN ORDER COMMUNICATION  CONCERNING: P&T Medication Policy on Herbal Medications  DESCRIPTION:  This patient's order for:  Cranberry  has been noted.  This product(s) is classified as an "herbal" or natural product. Due to a lack of definitive safety studies or FDA approval, nonstandard manufacturing practices, plus the potential risk of unknown drug-drug interactions while on inpatient medications, the Pharmacy and Therapeutics Committee does not permit the use of "herbal" or natural products of this type within [place].   ACTION TAKEN: The pharmacy department is unable to verify this order at this time. Please reevaluate patient's clinical condition at discharge and address if the herbal or natural product(s) should be resumed at that time.    

## 2015-07-31 LAB — BASIC METABOLIC PANEL
ANION GAP: 3 — AB (ref 5–15)
BUN: 19 mg/dL (ref 6–20)
CALCIUM: 8.3 mg/dL — AB (ref 8.9–10.3)
CHLORIDE: 105 mmol/L (ref 101–111)
CO2: 22 mmol/L (ref 22–32)
Creatinine, Ser: 1.33 mg/dL — ABNORMAL HIGH (ref 0.44–1.00)
GFR calc non Af Amer: 34 mL/min — ABNORMAL LOW (ref >60)
GFR, EST AFRICAN AMERICAN: 40 mL/min — AB (ref >60)
Glucose, Bld: 112 mg/dL — ABNORMAL HIGH (ref 65–99)
POTASSIUM: 4.3 mmol/L (ref 3.5–5.1)
Sodium: 130 mmol/L — ABNORMAL LOW (ref 135–145)

## 2015-07-31 LAB — SODIUM: Sodium: 129 mmol/L — ABNORMAL LOW (ref 135–145)

## 2015-07-31 MED ORDER — SODIUM CHLORIDE 0.9 % IV BOLUS (SEPSIS)
500.0000 mL | Freq: Once | INTRAVENOUS | Status: AC
  Administered 2015-07-31: 500 mL via INTRAVENOUS

## 2015-07-31 MED ORDER — ALUM & MAG HYDROXIDE-SIMETH 200-200-20 MG/5ML PO SUSP: 15.0000 mL | Freq: Four times a day (QID) | ORAL | Status: DC | PRN

## 2015-07-31 NOTE — Progress Notes (Signed)
Patient's IV infiltrated at 2015 and was removed. Attempted new insertion x1 without success. Told by patient's family that she's been a hard stick in the past. Called for assistance and updated family. Patient has not slept since 0700 on 4/7 and had finally fallen asleep when help arrived. Family requested to come back and try later. New IV inserted into right forearm this morning. Nursing staff will continue to monitor. Earleen Reaper, RN

## 2015-07-31 NOTE — Progress Notes (Signed)
[place] [facility] [place] NAME: Brenda Roman    MR#:  [id number]  DATE OF BIRTH:  Aug 11, 1923  SUBJECTIVE:  CHIEF COMPLAINT:   Chief Complaint  Patient presents with  . Hypertension   Generalized weakness. The patient had hypotension last night, she was treated with normal saline bolus. Blood pressure is better but in low side. REVIEW OF SYSTEMS:  CONSTITUTIONAL: No fever, has generalized weakness.  EYES: No blurred or double vision.  EARS, NOSE, AND THROAT: No tinnitus or ear pain.  RESPIRATORY: No cough, shortness of breath, wheezing or hemoptysis.  CARDIOVASCULAR: No chest pain, orthopnea, edema.  GASTROINTESTINAL: No nausea, vomiting, diarrhea or abdominal pain.  GENITOURINARY: No dysuria, hematuria.  ENDOCRINE: No polyuria, nocturia,  HEMATOLOGY: No anemia, easy bruising or bleeding SKIN: No rash or lesion. MUSCULOSKELETAL: No joint pain or arthritis.   NEUROLOGIC: No tingling, numbness, weakness.  PSYCHIATRY: No anxiety or depression.   DRUG ALLERGIES:   Allergies  Allergen Reactions  . Cephalexin Other (See Comments)    Reaction:  Unknown  . Novocain [Procaine] Rash  . Sulfa Antibiotics Rash    VITALS:  Blood pressure 107/48, pulse 74, temperature 97.6 F (36.4 C), temperature source Oral, resp. rate 20, height 5\' 2"  (1.575 m), weight 63.73 kg (140 lb 8 oz), SpO2 95 %.  PHYSICAL EXAMINATION:  GENERAL:  82 y.o.-year-old patient lying in the bed with no acute distress. But looks frgail. EYES: Pupils equal, round, reactive to light and accommodation. No scleral icterus. Extraocular muscles intact.  HEENT: Head atraumatic, normocephalic. Oropharynx and nasopharynx clear.  NECK:  Supple, no jugular venous distention. No thyroid enlargement, no tenderness.  LUNGS: Normal breath sounds bilaterally, no wheezing, rales,rhonchi or crepitation. No use of accessory muscles of respiration.  CARDIOVASCULAR: S1, S2 normal. No  murmurs, rubs, or gallops.  ABDOMEN: Soft, nontender, nondistended. Bowel sounds present. No organomegaly or mass.  EXTREMITIES: No pedal edema, cyanosis, or clubbing.  NEUROLOGIC: Cranial nerves II through XII are intact. Muscle strength 4/5 in all extremities except right leg which is paralyzed per her husband. Sensation intact. Gait not checked.  PSYCHIATRIC: The patient is alert and oriented x 3.  SKIN: No obvious rash, lesion, or ulcer.    LABORATORY PANEL:   CBC  Recent Labs Lab 07/29/15 2217  WBC 6.3  HGB 10.5*  HCT 32.9*  PLT 309   ------------------------------------------------------------------------------------------------------------------  Chemistries   Recent Labs Lab 07/29/15 2217  07/31/15 0504  NA 126*  < > 129*  K 4.5  --   --   CL 96*  --   --   CO2 23  --   --   GLUCOSE 106*  --   --   BUN 11  --   --   CREATININE 0.68  --   --   CALCIUM 9.2  --   --   < > = values in this interval not displayed. ------------------------------------------------------------------------------------------------------------------  Cardiac Enzymes  Recent Labs Lab 07/29/15 2217  TROPONINI 0.06*   ------------------------------------------------------------------------------------------------------------------  RADIOLOGY:  No results found.  EKG:   Orders placed or performed during the hospital encounter of 07/29/15  . ED EKG  . ED EKG    ASSESSMENT AND PLAN:   This is an 82 year old female admitted for hyponatremia.  1. Hyponatremia: Likely secondary to hypovolemia.  Improving,  continue NS iv , regular diet (she was on a low-sodium diet). Follow-up sodium.  2. Accelerated Hypertension: She was treated with Lopressor and hydralazine  IV when necessary. Since she had hypotension, metoprolol was discontinued.  * Hypotension. She was treated with normal saline bolus, blood pressure is better and continue IV normal saline.  3. Headache: Improved.  4.  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 Continue suppressive therapy with Valtrex  5. Chronic Atrial fibrillation,  continue Xarelto and aspirin   6. Liver transplant: continue Cellcept and prednisone  7. Recurrent urinary tract infection: Continue Macrobid prophylaxis  8. History of CAD and CVA. On Xarelto and aspirin   9. Chronic diastolic CHF. Stable. Watch for overload.  Generalized weakness. PT consult. All the records are reviewed and case discussed with Care Management/Social Workerr. Management plans discussed with the patient, her husband and they are in agreement.  CODE STATUS: DNR  TOTAL TIME TAKING CARE OF THIS PATIENT: 35 minutes.  Greater than 50% time was spent on coordination of care and face-to-face counseling.  POSSIBLE D/C IN 1-2 DAYS, DEPENDING ON CLINICAL CONDITION.   Demetrios Loll M.D on 07/31/2015 at 2:58 PM  Between 7am to 6pm - Pager - [phone]  After 6pm go to [email or web address] - password [email or web address] [place] [facility]  Office  [phone]  CC: Primary care physician; Kirk Ruths., MD

## 2015-07-31 NOTE — Progress Notes (Signed)
Spoke with Dr. Bridgett Larsson regarding BP issues overnight, aware patient is now 90's/60's. Sodium still low, but IV NS Infusing. Pt drowsy this morning but is now awake and eating breakfast, husband at bedside. Husband stated that patient has had issues taking metoprolol in the past, making her BP drop too low - Dr. Bridgett Larsson has cancelled the inpatient order. MD to round soon. Patient resting quietly, no complaints. Will continue to monitor.

## 2015-07-31 NOTE — Progress Notes (Signed)
PT Cancellation Note  Patient Details Name: Brenda Roman MRN: [id number] DOB: 11/09/1923   Cancelled Treatment:    Reason Eval/Treat Not Completed: Medical issues which prohibited therapy.  Awaiting explanation of elevated troponin.  See tomorrow.   Ramond Dial 07/31/2015, 12:31 PM   Mee Hives, PT MS Acute Rehab Dept. Number: [facility] [id number] and [place] [phone]

## 2015-07-31 NOTE — Progress Notes (Signed)
Husband states patient has not gotten much sleep the past several days and is glad she is resting now. Patient sleeping peacefully, easily awakened, alert and oriented. Husband assisting with meals today. No complaints at this time. Will continue to monitor.

## 2015-07-31 NOTE — Progress Notes (Signed)
MD notified of morning BP of 70s/30s bilaterally. Per Dr. Marcille Blanco, give 54mL bolus and recheck BP once complete. Patient has otherwise rested quietly tonight with no complaints. A&Ox4, remaining VS stable, and NSR on tele. Nursing staff will continue to monitor. Earleen Reaper, RN

## 2015-08-01 LAB — CBC
HEMATOCRIT: 27 % — AB (ref 35.0–47.0)
HEMOGLOBIN: 8.6 g/dL — AB (ref 12.0–16.0)
MCH: 27.5 pg (ref 26.0–34.0)
MCHC: 31.7 g/dL — AB (ref 32.0–36.0)
MCV: 86.8 fL (ref 80.0–100.0)
Platelets: 253 10*3/uL (ref 150–440)
RBC: 3.11 MIL/uL — AB (ref 3.80–5.20)
RDW: 17.5 % — ABNORMAL HIGH (ref 11.5–14.5)
WBC: 6.4 10*3/uL (ref 3.6–11.0)

## 2015-08-01 LAB — BASIC METABOLIC PANEL
Anion gap: 2 — ABNORMAL LOW (ref 5–15)
BUN: 16 mg/dL (ref 6–20)
CHLORIDE: 112 mmol/L — AB (ref 101–111)
CO2: 22 mmol/L (ref 22–32)
Calcium: 8.5 mg/dL — ABNORMAL LOW (ref 8.9–10.3)
Creatinine, Ser: 0.87 mg/dL (ref 0.44–1.00)
GFR calc Af Amer: >60 mL/min (ref >60)
GFR calc non Af Amer: 57 mL/min — ABNORMAL LOW (ref >60)
GLUCOSE: 77 mg/dL (ref 65–99)
POTASSIUM: 3.9 mmol/L (ref 3.5–5.1)
Sodium: 136 mmol/L (ref 135–145)

## 2015-08-01 LAB — MAGNESIUM: Magnesium: 1.9 mg/dL (ref 1.7–2.4)

## 2015-08-01 NOTE — Care Management Note (Signed)
Case Management Note  Patient Details  Name: Brenda Roman MRN: [id number] Date of Birth: 25-Sep-1923  Subjective/Objective:  Advacned notified of discharge and resumption of care for PT services. Patient and spouse are agreeable.                   Action/Plan:   Expected Discharge Date:                  Expected Discharge Plan:  [place]  In-House Referral:     Discharge planning Services     Post Acute Care Choice:  Home Health, Resumption of Svcs/PTA Provider Choice offered to:  Spouse  DME Arranged:    DME Agency:     HH Arranged:  PT Will:  [place]  Status of Service:  Completed, signed off  Medicare Important Message Given:  Yes Date Medicare IM Given:    Medicare IM give by:    Date Additional Medicare IM Given:    Additional Medicare Important Message give by:     If discussed at [place] of Stay Meetings, dates discussed:    Additional Comments:  Jolly Mango, RN 08/01/2015, 11:23 AM

## 2015-08-01 NOTE — Care Management Important Message (Signed)
Important Message  Patient Details  Name: Palak Skowron MRN: [id number] Date of Birth: 1923/09/24   Medicare Important Message Given:  Yes    Juliann Pulse A Ceil Roderick 08/01/2015, 10:22 AM

## 2015-08-01 NOTE — Discharge Summary (Signed)
Brenda Roman at [place] NAME: Brenda Roman    MR#:  [id number]  DATE OF BIRTH:  1923/10/10  DATE OF ADMISSION:  07/29/2015 ADMITTING PHYSICIAN: Harrie Foreman, MD  DATE OF DISCHARGE: 08/01/2015 11:16 AM  PRIMARY CARE PHYSICIAN: Kirk Ruths., MD    ADMISSION DIAGNOSIS:  Hyponatremia [E87.1]   DISCHARGE DIAGNOSIS:    SECONDARY DIAGNOSIS:   Past Medical History  Diagnosis Date  . Liver transplanted ([place])   . Macular degeneration   . Coronary artery disease   . Hypertension   . Shingles   . CHF (congestive heart failure) ([place])   . TIA (transient ischemic attack)   . 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 Aortic stenosis   . Mitral valve disease   . PBC (primary biliary cirrhosis) ([place])     s/p liver transplant  . CVA (cerebral infarction) June '16    HOSPITAL COURSE:   This is an 82 year old female admitted for hyponatremia.  1. Hyponatremia: Likely secondary to hypovolemia.  Improved with NS iv.  2. Accelerated Hypertension: She was treated with Lopressor and hydralazine IV when necessary. Since she had hypotension, metoprolol was discontinued.  * Hypotension. She was treated with normal saline bolus, blood pressure improved to normal range.  3. Headache: Improved.  4. 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 Continue suppressive therapy with Valtrex  5. Chronic Atrial fibrillation, continue Xarelto and aspirin   6. Liver transplant: continue Cellcept and prednisone  7. Recurrent urinary tract infection: Continue Macrobid prophylaxis  8. History of CAD and CVA. On Xarelto and aspirin   9. Chronic diastolic CHF. Stable.  PT evaluation: she needs HHPT.  DISCHARGE CONDITIONS:   Stable, discharged to home with HHPT.  CONSULTS OBTAINED:     DRUG ALLERGIES:   Allergies  Allergen Reactions  . Cephalexin Other (See Comments)    Reaction:  Unknown  . Novocain [Procaine] Rash  . Sulfa  Antibiotics Rash    DISCHARGE MEDICATIONS:   Discharge Medication List as of 08/01/2015 10:44 AM    CONTINUE these medications which have NOT CHANGED   Details  aspirin EC 81 MG tablet Take 81 mg by mouth daily., Until Discontinued, Historical Med    Cranberry 250 MG TABS Take 250 mg by mouth 2 (two) times daily., Until Discontinued, Historical Med    ezetimibe-simvastatin (VYTORIN) 10-20 MG tablet Take 1 tablet by mouth 3 (three) times a week. Patient takes on Monday, Wednesday, Friday, Until Discontinued, Historical Med    gabapentin (NEURONTIN) 100 MG capsule Take 100 mg by mouth 2 (two) times daily. , Until Discontinued, Historical Med    mirtazapine (REMERON) 7.5 MG tablet Take 7.5 mg by mouth at bedtime., Until Discontinued, Historical Med    mycophenolate (CELLCEPT) 500 MG tablet Take 500 mg by mouth every 12 (twelve) hours., Until Discontinued, Historical Med    nitrofurantoin, macrocrystal-monohydrate, (MACROBID) 100 MG capsule Take 1 capsule (100 mg total) by mouth daily., Starting 04/07/2015, Until Discontinued, Normal    predniSONE (DELTASONE) 5 MG tablet Take 5 mg by mouth daily., Until Discontinued, Historical Med    rivaroxaban (XARELTO) 20 MG TABS tablet Take 20 mg by mouth daily with supper., Until Discontinued, Historical Med    senna-docusate (SENOKOT-S) 8.6-50 MG per tablet Take 3 tablets by mouth at bedtime. , Until Discontinued, Historical Med    traZODone (DESYREL) 100 MG tablet Take 150 mg by mouth at bedtime., Until Discontinued, Historical Med  valACYclovir (VALTREX) 1000 MG tablet Take 1,000 mg by mouth every evening. , Until Discontinued, Historical Med         DISCHARGE INSTRUCTIONS:   If you experience worsening of your admission symptoms, develop shortness of breath, life threatening emergency, suicidal or homicidal thoughts you must seek medical attention immediately by calling 911 or calling your MD immediately  if symptoms less severe.  You  Must read complete instructions/literature along with all the possible adverse reactions/side effects for all the Medicines you take and that have been prescribed to you. Take any new Medicines after you have completely understood and accept all the possible adverse reactions/side effects.   Please note  You were cared for by a hospitalist during your hospital stay. If you have any questions about your discharge medications or the care you received while you were in the hospital after you are discharged, you can call the unit and asked to speak with the hospitalist on call if the hospitalist that took care of you is not available. Once you are discharged, your primary care physician will handle any further medical issues. Please note that NO REFILLS for any discharge medications will be authorized once you are discharged, as it is imperative that you return to your primary care physician (or establish a relationship with a primary care physician if you do not have one) for your aftercare needs so that they can reassess your need for medications and monitor your lab values.    Today   SUBJECTIVE   No complaint.   VITAL SIGNS:  Blood pressure 110/50, pulse 72, temperature 98.9 F (37.2 C), temperature source Oral, resp. rate 18, height 5\' 2"  (1.575 m), weight 57.335 kg (126 lb 6.4 oz), SpO2 95 %.  I/O:   Intake/Output Summary (Last 24 hours) at 08/01/15 1523 Last data filed at 08/01/15 1030  Gross per 24 hour  Intake      0 ml  Output   2100 ml  Net  -2100 ml    PHYSICAL EXAMINATION:  GENERAL:  82 y.o.-year-old patient lying in the bed with no acute distress. Looks fragile.  EYES: Pupils equal, round, reactive to light and accommodation. No scleral icterus. Extraocular muscles intact.  HEENT: Head atraumatic, normocephalic. Oropharynx and nasopharynx clear.  NECK:  Supple, no jugular venous distention. No thyroid enlargement, no tenderness.  LUNGS: Normal breath sounds bilaterally, no  wheezing, rales,rhonchi or crepitation. No use of accessory muscles of respiration.  CARDIOVASCULAR: S1, S2 normal. No murmurs, rubs, or gallops.  ABDOMEN: Soft, non-tender, non-distended. Bowel sounds present. No organomegaly or mass.  EXTREMITIES: No pedal edema, cyanosis, or clubbing.  NEUROLOGIC: Cranial nerves II through XII are intact. Muscle strength 4/5 in all extremities. Sensation intact. Gait not checked.  PSYCHIATRIC: The patient is alert and oriented x 3.  SKIN: No obvious rash, lesion, or ulcer.   DATA REVIEW:   CBC  Recent Labs Lab 08/01/15 0514  WBC 6.4  HGB 8.6*  HCT 27.0*  PLT 253    Chemistries   Recent Labs Lab 08/01/15 0514  NA 136  K 3.9  CL 112*  CO2 22  GLUCOSE 77  BUN 16  CREATININE 0.87  CALCIUM 8.5*  MG 1.9    Cardiac Enzymes  Recent Labs Lab 07/29/15 2217  TROPONINI 0.06*    Microbiology Results  Results for orders placed or performed in visit on 04/07/15  Microscopic Examination     Status: Abnormal   Collection Time: 04/07/15  1:36 PM  Result  Value Ref Range Status   WBC, UA >30 (H) 0 -  5 /hpf Final   RBC, UA None seen 0 -  2 /hpf Final   Epithelial Cells (non renal) 0-10 0 - 10 /hpf Final   Mucus, UA Present (A) Not Estab. Final   Bacteria, UA Many (A) None seen/Few Final    RADIOLOGY:  No results found.      Management plans discussed with the patient, her husband and they are in agreement.  CODE STATUS:  Code Status History    Date Active Date Inactive Code Status Order ID Comments User Context   07/30/2015  5:08 AM 08/01/2015  2:17 PM DNR [id number]  Harrie Foreman, MD Inpatient   07/18/2015  9:21 PM 07/20/2015  2:59 PM DNR [id number]  Dustin Flock, MD Inpatient   08/29/2014  9:02 PM 09/05/2014  3:27 PM DNR [id number]  Demetrios Loll, MD ED   08/23/2014  5:55 PM 08/25/2014  5:42 PM Full Code [id number]  Bettey Costa, MD Inpatient    Questions for Most Recent Historical Code Status (Order [id number])    Question Answer Comment    In the event of cardiac or respiratory ARREST Do not call a "code blue"    In the event of cardiac or respiratory ARREST Do not perform Intubation, CPR, defibrillation or ACLS    In the event of cardiac or respiratory ARREST Use medication by any route, position, wound care, and other measures to relive pain and suffering. May use oxygen, suction and manual treatment of airway obstruction as needed for comfort.     Advance Directive Documentation        Most Recent Value   Type of Advance Directive  Living will   Pre-existing out of facility DNR order (yellow form or pink MOST form)     "MOST" Form in Place?        TOTAL TIME TAKING CARE OF THIS PATIENT: 35 minutes.    Demetrios Loll M.D on 08/01/2015 at 3:23 PM  Between 7am to 6pm - Pager - [phone]  After 6pm go to [email or web address] - password [email or web address] [facility]  Office  [phone]  CC: Primary care physician; Kirk Ruths., MD

## 2015-08-01 NOTE — Evaluation (Signed)
Physical Therapy Evaluation Patient Details Name: Brenda Roman MRN: [id number] DOB: 06-Dec-1923 Today's Date: 08/01/2015   History of Present Illness  Pt is 82 y.o. F admitted to hospital for HTN and hyponatremia. Pt has hx of blindness, HTN, liver transplant, CHF and CVA leaving her w/R sided hemiparesis.   Clinical Impression  Pt is a pleasant 82 y.o. F admitted to hospital for HTN and hyponatremia. Pt has hx of HTN, liver transplant, CHF, and CVA leaving her w/ R sided hemiparesis. Pt is blind and HOH in both ears. Pt awake and oriented during evaluation. Caregiver present during eval and helped w/pt care. Prior to admission, pt required assist w/ADLs from caregiver and husband 24/7 at home. Pt previously admitted to hospice, but discharge approx. 2 weeks ago. Pt did not use AD for ambulation prior to admission. Occasional use of WC when pt out in community. Caregiver stated easier to ambulate w/ hand held assist vs. AD d/t blindness. Pt demonstrates poor B UE and LE strength, with the biggest deficit in R foot/ankle. Pt able to perform bed mobility using mod assist and use of railings w/heavy cues. Pt able to transfer from sit to stand at EOB with 2+ hand held mod assist for safety. Pt demonstrates fair sitting balance and poor standing balance. Pt able to perform 5x sit to stand assessment from EOB using hand held assist in 20 seconds, showing pt required increased time and assistance for sit to stand. Pt demonstrates deficits in strength, ROM, balance and mobility. Pt would benefit from further skilled PT to address deficits; recommend pt receive home health PT after discharge from acute hospitalization due to support pt already receives at home.     Follow Up Recommendations Home health PT;Supervision for mobility/OOB;Supervision/Assistance - 24 hour    Equipment Recommendations       Recommendations for Other Services       Precautions / Restrictions Precautions Precautions:  Fall Restrictions Weight Bearing Restrictions: No      Mobility  Bed Mobility Overal bed mobility: Needs Assistance Bed Mobility: Supine to Sit;Sit to Supine     Supine to sit: Mod assist Sit to supine: Mod assist   General bed mobility comments: Pt able to perform bed mobility with use of railings and mod assist for B LE and UE. Pt provided verbal and tactile cues for use of railings. Pt required increased time to perform bed mobility.    Transfers Overall transfer level: Needs assistance Equipment used: 2 person hand held assist Transfers: Sit to/from Stand Sit to Stand: Min assist;+2 safety/equipment         General transfer comment: Pt required 2+ hand held assit for tranfer from EOB. Caregiver helped provide cues and asssit. Pt provided heavy cues for standing due to blindness. Pt required extended time to maintain balance once standing. Pt able to perform 5x sit to stand from EOB using hand held assist in 20 seconds.  Ambulation/Gait             General Gait Details: Ambulation not performed due to pts weakness and fatigue.   Stairs            Wheelchair Mobility    Modified Rankin (Stroke Patients Only)       Balance Overall balance assessment: Needs assistance Sitting-balance support: Bilateral upper extremity supported;Feet supported Sitting balance-Leahy Scale: Fair Sitting balance - Comments: Pt able to maintain balance in sitting with feet supported and B UE on bed. Pt demonstrates flexed trunk when sitting. Pt  provided CGA for safety.    Standing balance support: Bilateral upper extremity supported Standing balance-Leahy Scale: Poor Standing balance comment: Pt demonstrates poor standing balance with 2+ hand held assist. Pt demonstrated sway upon standing with increased time to maintain balance.                              Pertinent Vitals/Pain Pain Assessment: No/denies pain    Home Living Family/patient expects to be  discharged to:: Private residence Living Arrangements: Spouse/significant other Available Help at Discharge: Family;Available 24 hours/day;Personal care attendant Type of Home: House Home Access: Ramped entrance     Home Layout: Able to live on main level with bedroom/bathroom Home Equipment: Walker - 2 wheels;Bedside commode;Wheelchair - manual;Hospital bed Additional Comments: Pt has caregiver helping all day M-F, husband also able to help pt    Prior Function Level of Independence: Needs assistance   Gait / Transfers Assistance Needed: Requires physical assistance from husband or caregiver for all mobility. She typically ambulates limited household distances with HHA or FWW and assistance. WC used for ambulation outside of home.  ADL's / Homemaking Assistance Needed: Assist from caregiver/husband for all ADLs  Comments: Pt was previously on hospice services until 3/21 when family opted to discontinue as pt had improved. She is cared for 24 hrs per day.      Hand Dominance   Dominant Hand: Right    Extremity/Trunk Assessment   Upper Extremity Assessment: RUE deficits/detail;LUE deficits/detail RUE Deficits / Details: grossly 3+/5 strength in R UE, good shoulder flexion ROM     LUE Deficits / Details: grossly 3+/5 L UE strength, good shoulder flexion ROM   Lower Extremity Assessment: LLE deficits/detail;RLE deficits/detail RLE Deficits / Details: grossly 3/5 strength w/DF and PF, 4/5 knee strength on R LE. LLE Deficits / Details: grossly 4/5 L LE strength     Communication   Communication: HOH  Cognition Arousal/Alertness: Awake/alert Behavior During Therapy: Impulsive (Pt attempted to stand before PT instruction) Overall Cognitive Status: Within Functional Limits for tasks assessed                      General Comments      Exercises        Assessment/Plan    PT Assessment Patient needs continued PT services  PT Diagnosis Difficulty  walking;Abnormality of gait;Generalized weakness   PT Problem List Decreased strength;Decreased range of motion;Decreased balance;Decreased mobility;Decreased activity tolerance  PT Treatment Interventions Gait training;Stair training;Therapeutic activities;Therapeutic exercise;Balance training   PT Goals (Current goals can be found in the Care Plan section) Acute Rehab PT Goals Patient Stated Goal: to go home PT Goal Formulation: With patient/family Time For Goal Achievement: 08/15/15 Potential to Achieve Goals: Fair    Frequency Min 2X/week   Barriers to discharge        Co-evaluation               End of Session Equipment Utilized During Treatment: Gait belt Activity Tolerance: Patient limited by fatigue Patient left: in bed;with family/visitor present;with call bell/phone within reach (caregiver in room, pt did not want to sit in recliner today) Nurse Communication: Mobility status (communicated w/MD)         [id number] PT Time Calculation (min) (ACUTE ONLY): 14 min   Charges:         PT G Codes:        Sherral Hammers 08-20-2015, 10:31 AM M. Barnett Abu,  SPT

## 2015-08-01 NOTE — Progress Notes (Signed)
Discharge instructions given. IV and tele removed. No new medications. Education on hyponatremia given. Questions answered from patient and husband. Volunteer on there way to discharge now.

## 2015-08-09 ENCOUNTER — Ambulatory Visit: Admitting: Family

## 2015-08-10 ENCOUNTER — Telehealth: Payer: Self-pay

## 2015-08-10 NOTE — Telephone Encounter (Signed)
Spoke with [name], pt son, in reference to refill of macrobid. [name] states that pt is completely out of macrobid, pt takes medication daily, currently has dysuria, and increased frequency. [name] also stated that pt was in the hospital last week and had her f/u appt today with Dr. Ouida Sills. Per [name] Dr. Ouida Sills ran a ucx today because they made him aware of current urinary issues. Please advise.

## 2015-08-10 NOTE — Telephone Encounter (Signed)
LMOM- Pt pharmacy sent a refill request for macrobid.

## 2015-08-11 NOTE — Telephone Encounter (Signed)
Patient was only to be on the [facility] for three months and then she was to come in for an office visit.  She will need to make an appointment.

## 2015-08-11 NOTE — Telephone Encounter (Signed)
Spoke with [name] in reference to pt needing a f/u appt. [name] stated that pt would prefer to continue tx with Dr. Ouida Sills and wanted to cancel all appts with [facility]. Made [name] aware if pt changes her mind to give [place] a call back. [name] voiced understanding.

## 2015-08-12 IMAGING — CR DG CHEST 1V PORT
1 series · 1 of 1 positions shown · non-contrast
Comparison: 08/23/2014

CLINICAL DATA: Shortness of breath, recent history of pneumonia

EXAM:
PORTABLE CHEST - 1 VIEW

[ap]
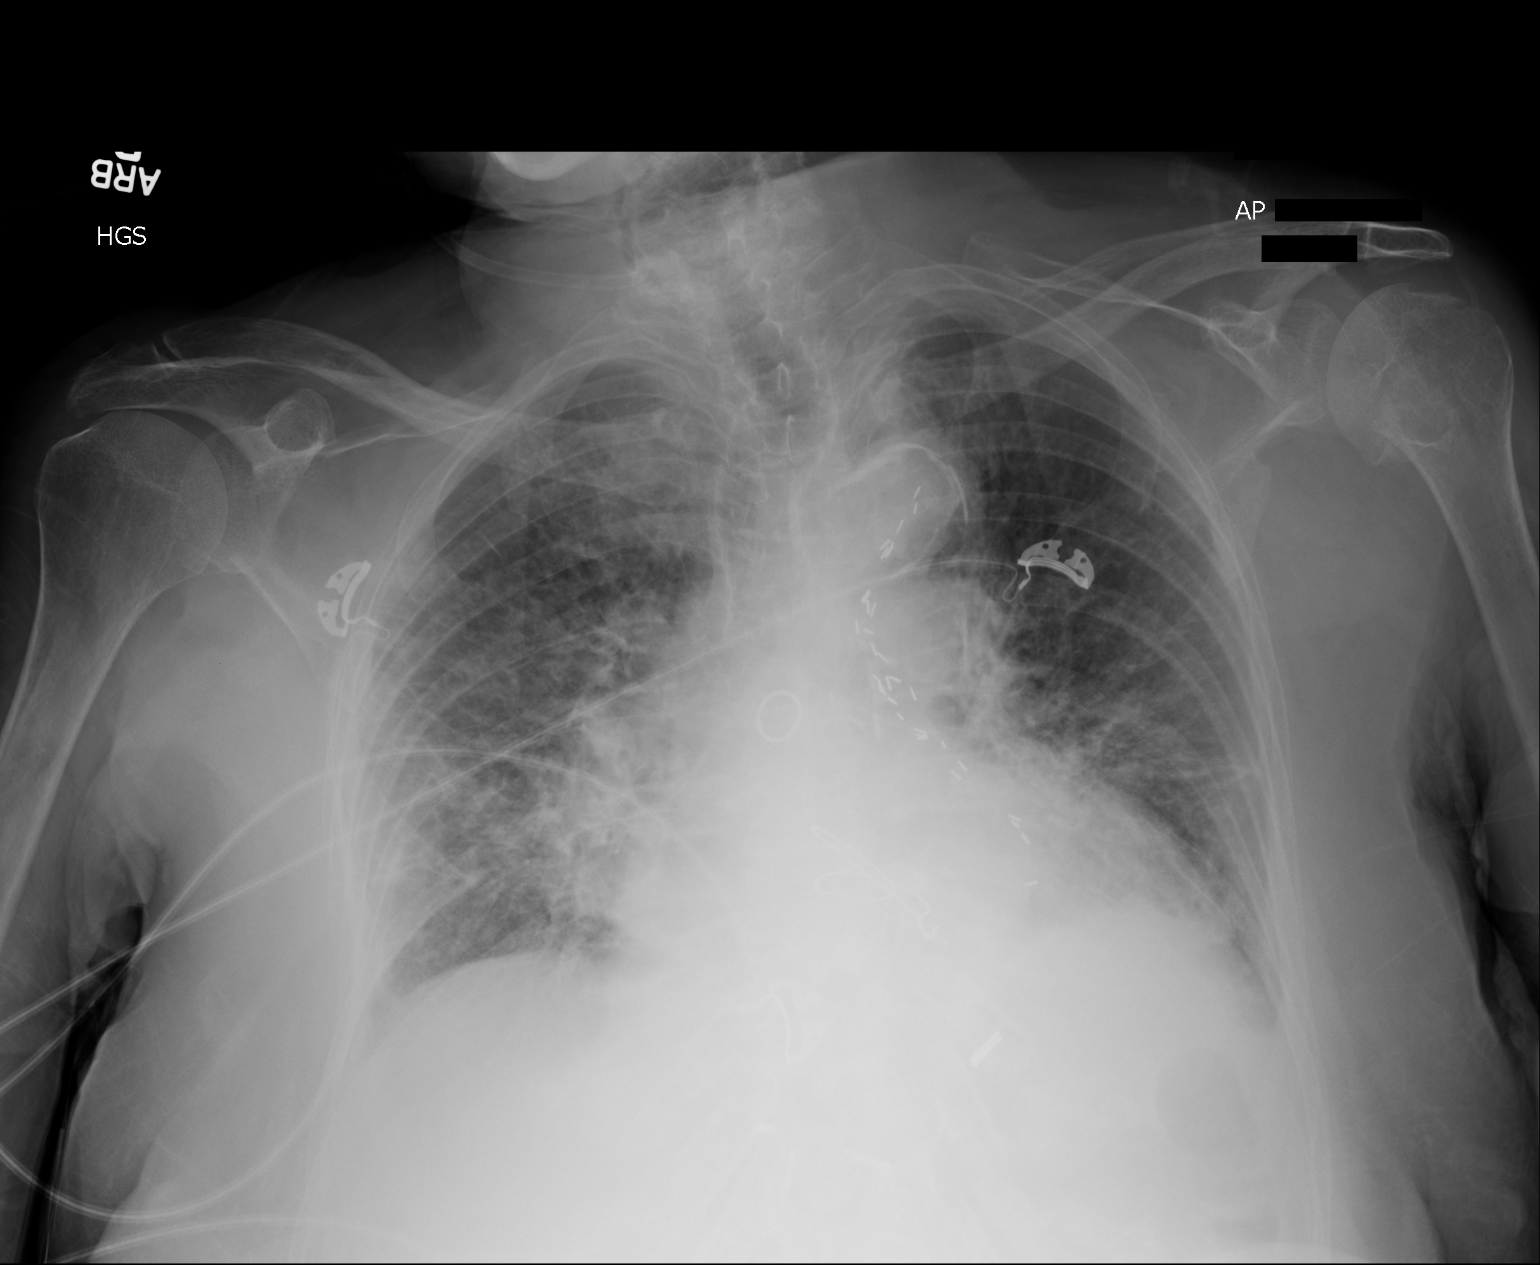

[1 of 1 positions shown; findings below may reference images not displayed]

FINDINGS: Cardiac shadow is enlarged. Postsurgical changes are again seen and
stable. Diffuse aortic calcifications are noted. The lungs are well
aerated bilaterally and demonstrate diffuse interstitial changes
likely related to degree of interstitial edema. No focal confluent
infiltrate is seen. No bony abnormality is noted.
IMPRESSION: Changes consistent with vascular congestion and interstitial edema.

## 2015-08-15 IMAGING — CR DG CHEST 1V PORT
1 series · 1 of 1 positions shown · non-contrast
Comparison: August 29, 2014.

CLINICAL DATA: PICC line placement.

EXAM:
PORTABLE CHEST - 1 VIEW

[ap]
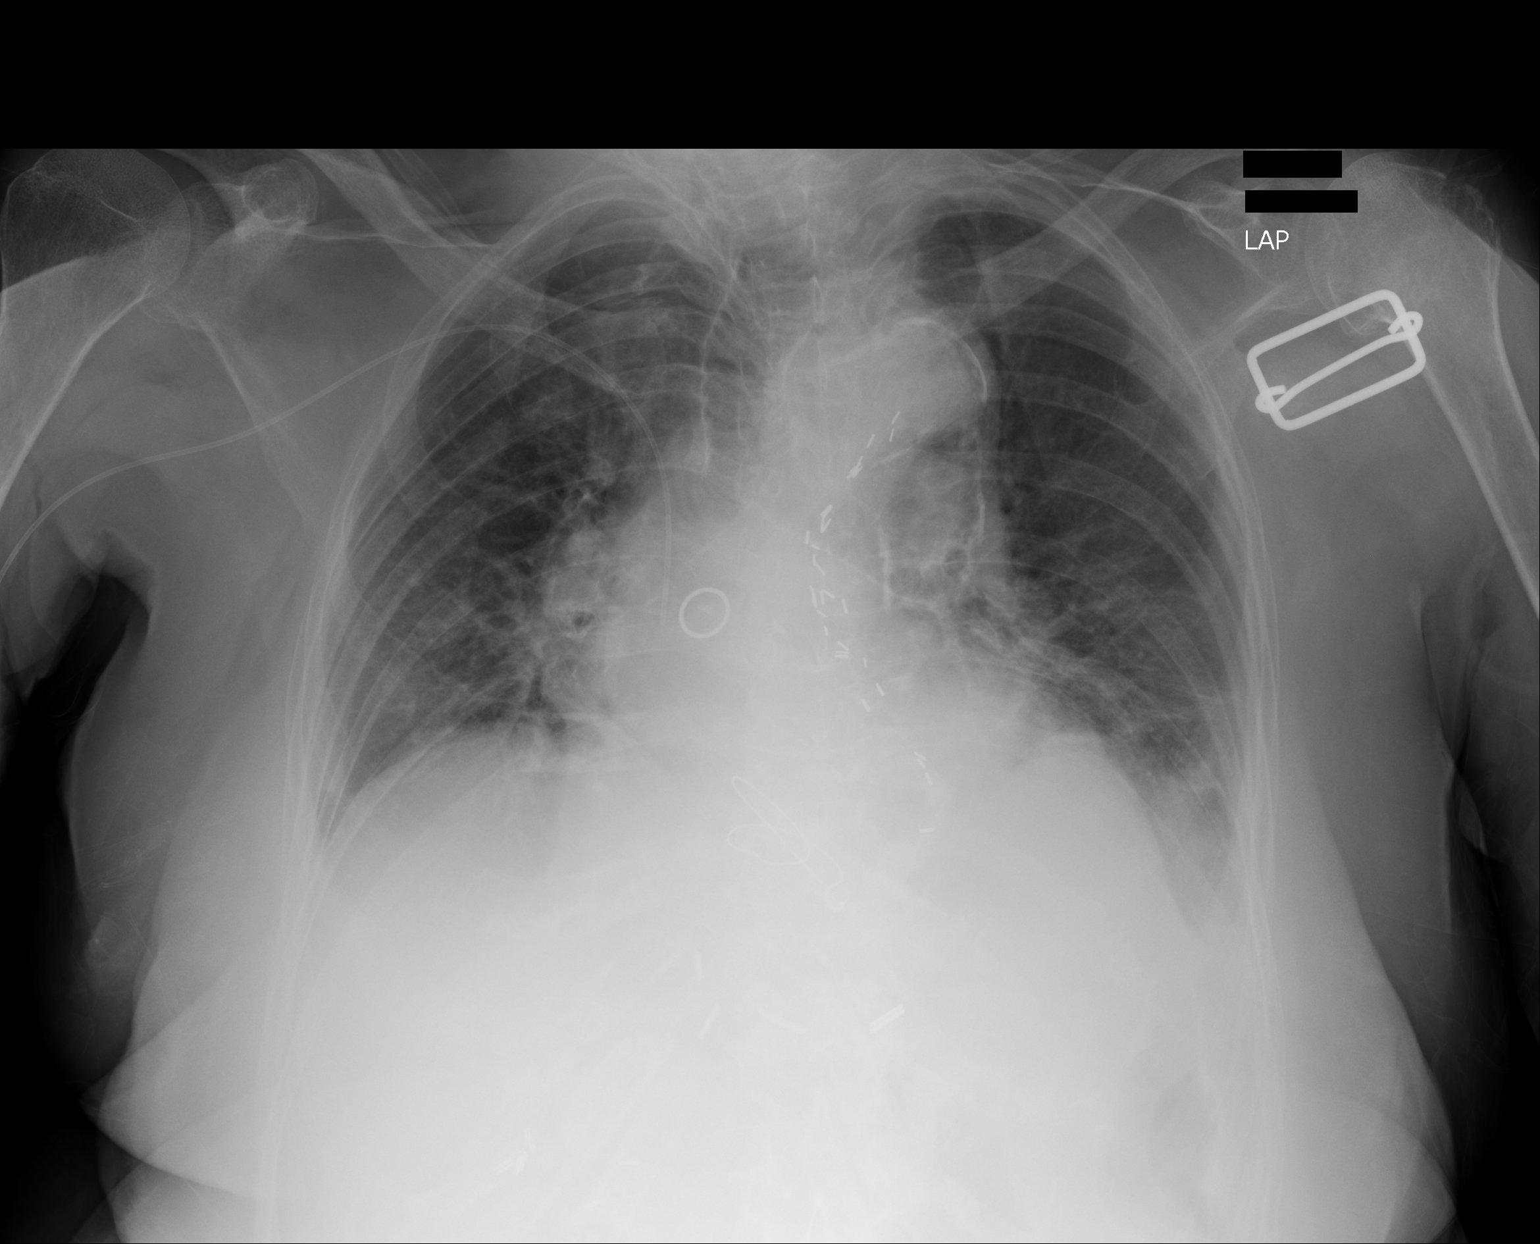

[1 of 1 positions shown; findings below may reference images not displayed]

FINDINGS: Stable cardiomegaly and central pulmonary vascular congestion. No
pneumothorax is noted. Mild left basilar opacity is noted concerning
for edema or atelectasis with associated effusion. Status post
coronary artery bypass graft. Interval placement of right-sided PICC
line with distal tip overlying expected position of SVC.
IMPRESSION: Interval placement of right-sided PICC line with distal tip
overlying expected position of SVC.

## 2015-09-06 ENCOUNTER — Ambulatory Visit: Payer: Medicare Other | Admitting: Urology

## 2015-09-06 DIAGNOSIS — G629 Polyneuropathy, unspecified: Secondary | ICD-10-CM | POA: Insufficient documentation

## 2015-09-08 ENCOUNTER — Emergency Department: Payer: Medicare Other

## 2015-09-08 ENCOUNTER — Encounter: Payer: Self-pay | Admitting: Emergency Medicine

## 2015-09-08 ENCOUNTER — Emergency Department
Admission: EM | Admit: 2015-09-08 | Discharge: 2015-09-08 | Disposition: A | Discharge status: Home / Self Care | Payer: Medicare Other | Attending: Emergency Medicine | Admitting: Emergency Medicine

## 2015-09-08 DIAGNOSIS — Z7982 Long term (current) use of aspirin: Secondary | ICD-10-CM | POA: Insufficient documentation

## 2015-09-08 DIAGNOSIS — I110 Hypertensive heart disease with heart failure: Secondary | ICD-10-CM | POA: Diagnosis not present

## 2015-09-08 DIAGNOSIS — Z87891 Personal history of nicotine dependence: Secondary | ICD-10-CM | POA: Insufficient documentation

## 2015-09-08 DIAGNOSIS — Z8679 Personal history of other diseases of the circulatory system: Secondary | ICD-10-CM | POA: Diagnosis not present

## 2015-09-08 DIAGNOSIS — Z8673 Personal history of transient ischemic attack (TIA), and cerebral infarction without residual deficits: Secondary | ICD-10-CM | POA: Insufficient documentation

## 2015-09-08 DIAGNOSIS — Z79899 Other long term (current) drug therapy: Secondary | ICD-10-CM | POA: Diagnosis not present

## 2015-09-08 DIAGNOSIS — Z85828 Personal history of other malignant neoplasm of skin: Secondary | ICD-10-CM | POA: Insufficient documentation

## 2015-09-08 DIAGNOSIS — Z951 Presence of aortocoronary bypass graft: Secondary | ICD-10-CM | POA: Diagnosis not present

## 2015-09-08 DIAGNOSIS — I509 Heart failure, unspecified: Secondary | ICD-10-CM | POA: Insufficient documentation

## 2015-09-08 DIAGNOSIS — E785 Hyperlipidemia, unspecified: Secondary | ICD-10-CM | POA: Insufficient documentation

## 2015-09-08 DIAGNOSIS — R1012 Left upper quadrant pain: Secondary | ICD-10-CM | POA: Diagnosis not present

## 2015-09-08 DIAGNOSIS — I2510 Atherosclerotic heart disease of native coronary artery without angina pectoris: Secondary | ICD-10-CM | POA: Diagnosis not present

## 2015-09-08 HISTORY — DX: Malignant (primary) neoplasm, unspecified: C80.1

## 2015-09-08 LAB — CBC
HCT: 33.4 % — ABNORMAL LOW (ref 35.0–47.0)
Hemoglobin: 10.7 g/dL — ABNORMAL LOW (ref 12.0–16.0)
MCH: 27.8 pg (ref 26.0–34.0)
MCHC: 32.1 g/dL (ref 32.0–36.0)
MCV: 86.7 fL (ref 80.0–100.0)
PLATELETS: 251 10*3/uL (ref 150–440)
RBC: 3.85 MIL/uL (ref 3.80–5.20)
RDW: 17.1 % — ABNORMAL HIGH (ref 11.5–14.5)
WBC: 4.6 10*3/uL (ref 3.6–11.0)

## 2015-09-08 LAB — COMPREHENSIVE METABOLIC PANEL
ALT: 20 U/L (ref 14–54)
AST: 24 U/L (ref 15–41)
Albumin: 4.5 g/dL (ref 3.5–5.0)
Alkaline Phosphatase: 59 U/L (ref 38–126)
Anion gap: 8 (ref 5–15)
BUN: 11 mg/dL (ref 6–20)
CALCIUM: 9.5 mg/dL (ref 8.9–10.3)
CO2: 27 mmol/L (ref 22–32)
Chloride: 104 mmol/L (ref 101–111)
Creatinine, Ser: 0.83 mg/dL (ref 0.44–1.00)
GFR calc Af Amer: >60 mL/min (ref >60)
GFR calc non Af Amer: >60 mL/min (ref >60)
GLUCOSE: 87 mg/dL (ref 65–99)
Potassium: 3.6 mmol/L (ref 3.5–5.1)
SODIUM: 139 mmol/L (ref 135–145)
TOTAL PROTEIN: 7.4 g/dL (ref 6.5–8.1)
Total Bilirubin: 0.5 mg/dL (ref 0.3–1.2)

## 2015-09-08 LAB — URINALYSIS COMPLETE WITH MICROSCOPIC (ARMC ONLY)
BACTERIA UA: NONE SEEN
BILIRUBIN URINE: NEGATIVE
GLUCOSE, UA: NEGATIVE mg/dL
Hgb urine dipstick: NEGATIVE
Ketones, ur: NEGATIVE mg/dL
Leukocytes, UA: NEGATIVE
NITRITE: NEGATIVE
Protein, ur: NEGATIVE mg/dL
SPECIFIC GRAVITY, URINE: 1.01 (ref 1.005–1.030)
pH: 6 (ref 5.0–8.0)

## 2015-09-08 LAB — LIPASE, BLOOD: LIPASE: 37 U/L (ref 11–51)

## 2015-09-08 MED ORDER — DIATRIZOATE MEGLUMINE & SODIUM 66-10 % PO SOLN
15.0000 mL | Freq: Once | ORAL | Status: AC
  Administered 2015-09-08: 15 mL via ORAL

## 2015-09-08 MED ORDER — IOPAMIDOL (ISOVUE-300) INJECTION 61%
100.0000 mL | Freq: Once | INTRAVENOUS | Status: AC | PRN
  Administered 2015-09-08: 100 mL via INTRAVENOUS

## 2015-09-08 NOTE — ED Provider Notes (Signed)
[facility] Emergency Department Provider Note  ____________________________________________    I have reviewed the triage vital signs and the nursing notes.   HISTORY  Chief Complaint Abdominal Pain    HPI Brenda Roman is a 82 y.o. female who presents with complaints of left upper quadrant aching sensation that started overnight. She denies shortness of breath or pleurisy. She denies nausea or vomiting. She denies fevers or chills. No dysuria. She has never had this before. She denies injury to the area. She reports compliance with her medications. She reports normal stools   Past Medical History  Diagnosis Date  . Liver transplanted ([place])   . Macular degeneration   . Coronary artery disease   . Hypertension   . Shingles   . CHF (congestive heart failure) ([place])   . TIA (transient ischemic attack)   . 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 Aortic stenosis   . Mitral valve disease   . PBC (primary biliary cirrhosis) ([place])     s/p liver transplant  . CVA (cerebral infarction) June '16  . Cancer [facility])     skin    Patient Active Problem List   Diagnosis Date Noted  . Hyponatremia 07/30/2015  . Acute CHF (congestive heart failure) ([place]) 07/18/2015  . 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 Closed wedge fracture of lumbar vertebra ([place]) 10/11/2014  . Ischemic stroke ([place]) 10/07/2014  . Acute urinary tract infection 10/07/2014  . 2Nd nerve palsy 10/07/2014  . Closed fracture of humerus, surgical neck 09/29/2014  . Goals of care, counseling/discussion   . Acute encephalopathy 08/29/2014  . Hypoxia 08/29/2014  . Healthcare-associated pneumonia 08/29/2014  . Pneumonia 08/29/2014  . CVA (cerebral infarction) 08/23/2014  . Arteriosclerosis of coronary artery 03/15/2014  . Paroxysmal atrial fibrillation ([place]) 03/15/2014  . Atherosclerosis of abdominal aorta ([place]) 01/27/2014  . HLD (hyperlipidemia) 04/07/2012  . BP (high blood pressure) 04/07/2012   . History of liver transplant ([place]) 04/03/2012  . Encounter for other procedures for purposes other than remedying health state 04/03/2012  . Prolapse of urethra 03/10/2012  . Pelvic relaxation due to uterovaginal prolapse 03/10/2012    Past Surgical History  Procedure Laterality Date  . Coronary artery bypass graft    . Joint replacement    . Bowel resection    . Colonoscopy    . Liver transplant  1990    Current Outpatient Rx  Name  Route  Sig  Dispense  Refill  . aspirin EC 81 MG tablet   Oral   Take 81 mg by mouth daily.         . Cranberry 250 MG TABS   Oral   Take 250 mg by mouth 2 (two) times daily.         Marland Kitchen ezetimibe-simvastatin (VYTORIN) 10-20 MG tablet   Oral   Take 1 tablet by mouth 3 (three) times a week. Patient takes on Monday, Wednesday, Friday         . gabapentin (NEURONTIN) 100 MG capsule   Oral   Take 100 mg by mouth 2 (two) times daily.          . mirtazapine (REMERON) 7.5 MG tablet   Oral   Take 7.5 mg by mouth at bedtime.         . mycophenolate (CELLCEPT) 500 MG tablet   Oral   Take 500 mg by mouth every 12 (twelve) hours.         . nitrofurantoin, macrocrystal-monohydrate, (  MACROBID) 100 MG capsule   Oral   Take 1 capsule (100 mg total) by mouth daily.   30 capsule   3   . predniSONE (DELTASONE) 5 MG tablet   Oral   Take 5 mg by mouth daily.         . rivaroxaban (XARELTO) 20 MG TABS tablet   Oral   Take 20 mg by mouth daily with supper.         . senna-docusate (SENOKOT-S) 8.6-50 MG per tablet   Oral   Take 3 tablets by mouth at bedtime.          . traZODone (DESYREL) 100 MG tablet   Oral   Take 150 mg by mouth at bedtime.         . valACYclovir (VALTREX) 1000 MG tablet   Oral   Take 1,000 mg by mouth every evening.            Allergies Cephalexin; Novocain; and Sulfa antibiotics  Family History  Problem Relation Age of Onset  . Hypertension Father     Social History Social History   Substance Use Topics  . Smoking status: Former Research scientist (life sciences)  . Smokeless tobacco: None  . Alcohol Use: No    Review of Systems  Constitutional: Negative for fever. Eyes: Negative for redness ENT: Negative for sore throat Cardiovascular: Negative for chest pain Respiratory: Negative for shortness of breath.No pleurisy or cough Gastrointestinal:As above Genitourinary: Negative for dysuria. Musculoskeletal: Negative for back pain. Skin: Negative for injury Neurological: Negative for headache Psychiatric: no anxiety    ____________________________________________   PHYSICAL EXAM:  VITAL SIGNS: ED Triage Vitals  Enc Vitals Group     BP 09/08/15 0906 160/71 mmHg     Pulse Rate 09/08/15 0906 77     Resp 09/08/15 0906 12     Temp 09/08/15 0906 98.6 F (37 C)     Temp Source 09/08/15 0906 Oral     SpO2 09/08/15 0906 99 %     Weight 09/08/15 0906 122 lb (55.339 kg)     Height 09/08/15 0906 5\' 2"  (1.575 m)     Head Cir --      Peak Flow --      Pain Score 09/08/15 0907 8     Pain Loc --      Pain Edu? --      Excl. in [place]? --      Constitutional: Alert and oriented. Well appearing and in no distress. Pleasant and interactive  ENT   Head: Normocephalic and atraumatic.   Mouth/Throat: Mucous membranes are moist. Cardiovascular: Normal rate, regular rhythm. Normal and symmetric distal pulses are present in the upper extremities.  Respiratory: Normal respiratory effort without tachypnea nor retractions. Breath sounds are clear and equal bilaterally.  Gastrointestinal: Soft and non-tender in all quadrants. No distention. There is no CVA tenderness. Genitourinary: deferred Musculoskeletal: Nontender with normal range of motion in all extremities. No lower extremity tenderness nor edema. Neurologic:  Normal speech and language. No gross focal neurologic deficits are appreciated. Skin:  Skin is warm, dry and intact. No rash noted. Psychiatric: Mood and affect are normal.  Patient exhibits appropriate insight and judgment.  ____________________________________________    LABS (pertinent positives/negatives)  Labs Reviewed  CBC - Abnormal; Notable for the following:    Hemoglobin 10.7 (*)    HCT 33.4 (*)    RDW 17.1 (*)    All other components within normal limits  URINALYSIS COMPLETEWITH MICROSCOPIC ([facility] ONLY) - Abnormal;  Notable for the following:    Color, Urine COLORLESS (*)    APPearance CLEAR (*)    Squamous Epithelial / LPF 0-5 (*)    All other components within normal limits  COMPREHENSIVE METABOLIC PANEL  LIPASE, BLOOD  MYCOPHENOLIC ACID (CELLCEPT)    ____________________________________________   EKG  ED ECG REPORT I, Lavonia Drafts, the attending physician, personally viewed and interpreted this ECG.   Date: 09/08/2015  EKG Time: 9:05 AM  Rate: 80  Rhythm: normal sinus rhythm   Intervals:left bundle branch block  ST&T Change: Nonspecific changes   ____________________________________________    RADIOLOGY  CT scan shows no acute abnormalities  ____________________________________________   PROCEDURES  Procedure(s) performed: none  Critical Care performed: none  ____________________________________________   INITIAL IMPRESSION / ASSESSMENT AND PLAN / ED COURSE  Pertinent labs & imaging results that were available during my care of the patient were reviewed by me and considered in my medical decision making (see chart for details).  Patient presents with complaints of mild left-sided upper abdomen aching. She is overall well-appearing and her exam is very benign. She is immunosuppressed is afebrile and lab work is reassuring. CT scan does not demonstrate any acute abnormalities. In fact patient reports her pain has resolved completely without any intervention.  That she is pain-free and has a normal workup I feel she is appropriate for discharge. Discussed this with the patient her husband and her daughter and  they agree that they will return promptly if any return of her symptoms  ____________________________________________   FINAL CLINICAL IMPRESSION(S) / ED DIAGNOSES  Final diagnoses:  Left upper quadrant pain          Lavonia Drafts, MD 09/08/15 1507

## 2015-09-08 NOTE — ED Notes (Signed)
Report received - pt was taken to the toilet but missed the hat for urine sample

## 2015-09-08 NOTE — Discharge Instructions (Signed)

## 2015-09-08 NOTE — ED Notes (Signed)
Pt via ems from home with left side pain, both upper and lower quadrants. Pt reports some radiation to back. Denies urinary symptoms. Pt is completely blind x 1 year. Pt alert & oriented with NAD noted.

## 2015-09-16 LAB — MYCOPHENOLIC ACID (CELLCEPT)
MPA GLUCURONIDE: 44 ug/mL (ref 15–125)
MPA: 3.4 ug/mL (ref 1.0–3.5)

## 2015-09-19 ENCOUNTER — Inpatient Hospital Stay
Admission: EM | Admit: 2015-09-19 | Discharge: 2015-09-21 | DRG: 309 | Disposition: A | Discharge status: Home / Self Care | Payer: Medicare Other | Attending: Internal Medicine | Admitting: Internal Medicine

## 2015-09-19 ENCOUNTER — Emergency Department: Payer: Medicare Other

## 2015-09-19 DIAGNOSIS — H9193 Unspecified hearing loss, bilateral: Secondary | ICD-10-CM

## 2015-09-19 DIAGNOSIS — Z8249 Family history of ischemic heart disease and other diseases of the circulatory system: Secondary | ICD-10-CM

## 2015-09-19 DIAGNOSIS — I4891 Unspecified atrial fibrillation: Secondary | ICD-10-CM

## 2015-09-19 DIAGNOSIS — I509 Heart failure, unspecified: Secondary | ICD-10-CM | POA: Diagnosis present

## 2015-09-19 DIAGNOSIS — I480 Paroxysmal atrial fibrillation: Principal | ICD-10-CM | POA: Diagnosis present

## 2015-09-19 DIAGNOSIS — Z951 Presence of aortocoronary bypass graft: Secondary | ICD-10-CM

## 2015-09-19 DIAGNOSIS — Z85828 Personal history of other malignant neoplasm of skin: Secondary | ICD-10-CM

## 2015-09-19 DIAGNOSIS — B952 Enterococcus as the cause of diseases classified elsewhere: Secondary | ICD-10-CM | POA: Diagnosis present

## 2015-09-19 DIAGNOSIS — I080 Rheumatic disorders of both mitral and aortic valves: Secondary | ICD-10-CM | POA: Diagnosis present

## 2015-09-19 DIAGNOSIS — K743 Primary biliary cirrhosis: Secondary | ICD-10-CM | POA: Diagnosis present

## 2015-09-19 DIAGNOSIS — I2510 Atherosclerotic heart disease of native coronary artery without angina pectoris: Secondary | ICD-10-CM | POA: Diagnosis present

## 2015-09-19 DIAGNOSIS — Z7901 Long term (current) use of anticoagulants: Secondary | ICD-10-CM

## 2015-09-19 DIAGNOSIS — H3530 Unspecified macular degeneration: Secondary | ICD-10-CM | POA: Diagnosis present

## 2015-09-19 DIAGNOSIS — I110 Hypertensive heart disease with heart failure: Secondary | ICD-10-CM | POA: Diagnosis present

## 2015-09-19 DIAGNOSIS — Z944 Liver transplant status: Secondary | ICD-10-CM

## 2015-09-19 DIAGNOSIS — Z8673 Personal history of transient ischemic attack (TIA), and cerebral infarction without residual deficits: Secondary | ICD-10-CM

## 2015-09-19 DIAGNOSIS — R778 Other specified abnormalities of plasma proteins: Secondary | ICD-10-CM

## 2015-09-19 DIAGNOSIS — Z955 Presence of coronary angioplasty implant and graft: Secondary | ICD-10-CM

## 2015-09-19 DIAGNOSIS — Z87891 Personal history of nicotine dependence: Secondary | ICD-10-CM | POA: Diagnosis not present

## 2015-09-19 DIAGNOSIS — N390 Urinary tract infection, site not specified: Secondary | ICD-10-CM

## 2015-09-19 DIAGNOSIS — I472 Ventricular tachycardia: Secondary | ICD-10-CM | POA: Diagnosis present

## 2015-09-19 DIAGNOSIS — R000 Tachycardia, unspecified: Secondary | ICD-10-CM

## 2015-09-19 DIAGNOSIS — I959 Hypotension, unspecified: Secondary | ICD-10-CM | POA: Diagnosis present

## 2015-09-19 DIAGNOSIS — I447 Left bundle-branch block, unspecified: Secondary | ICD-10-CM | POA: Diagnosis present

## 2015-09-19 DIAGNOSIS — E876 Hypokalemia: Secondary | ICD-10-CM | POA: Diagnosis present

## 2015-09-19 DIAGNOSIS — I499 Cardiac arrhythmia, unspecified: Secondary | ICD-10-CM | POA: Diagnosis present

## 2015-09-19 DIAGNOSIS — Z66 Do not resuscitate: Secondary | ICD-10-CM | POA: Diagnosis present

## 2015-09-19 DIAGNOSIS — H547 Unspecified visual loss: Secondary | ICD-10-CM

## 2015-09-19 DIAGNOSIS — Z9049 Acquired absence of other specified parts of digestive tract: Secondary | ICD-10-CM

## 2015-09-19 DIAGNOSIS — R748 Abnormal levels of other serum enzymes: Secondary | ICD-10-CM | POA: Diagnosis present

## 2015-09-19 DIAGNOSIS — R7989 Other specified abnormal findings of blood chemistry: Secondary | ICD-10-CM

## 2015-09-19 LAB — URINALYSIS COMPLETE WITH MICROSCOPIC (ARMC ONLY)
BILIRUBIN URINE: NEGATIVE
Glucose, UA: NEGATIVE mg/dL
Nitrite: NEGATIVE
PH: 5 (ref 5.0–8.0)
PROTEIN: NEGATIVE mg/dL
SQUAMOUS EPITHELIAL / LPF: NONE SEEN
Specific Gravity, Urine: 1.006 (ref 1.005–1.030)

## 2015-09-19 LAB — CBC
HCT: 28.1 % — ABNORMAL LOW (ref 35.0–47.0)
Hemoglobin: 8.9 g/dL — ABNORMAL LOW (ref 12.0–16.0)
MCH: 27.9 pg (ref 26.0–34.0)
MCHC: 31.7 g/dL — ABNORMAL LOW (ref 32.0–36.0)
MCV: 87.8 fL (ref 80.0–100.0)
Platelets: 222 K/uL (ref 150–440)
RBC: 3.2 MIL/uL — ABNORMAL LOW (ref 3.80–5.20)
RDW: 16.8 % — ABNORMAL HIGH (ref 11.5–14.5)
WBC: 5.3 K/uL (ref 3.6–11.0)

## 2015-09-19 LAB — BASIC METABOLIC PANEL
ANION GAP: 6 (ref 5–15)
BUN: 10 mg/dL (ref 6–20)
CHLORIDE: 107 mmol/L (ref 101–111)
CO2: 24 mmol/L (ref 22–32)
CREATININE: 0.8 mg/dL (ref 0.44–1.00)
Calcium: 8.7 mg/dL — ABNORMAL LOW (ref 8.9–10.3)
GFR calc non Af Amer: >60 mL/min (ref >60)
Glucose, Bld: 94 mg/dL (ref 65–99)
POTASSIUM: 3.6 mmol/L (ref 3.5–5.1)
SODIUM: 137 mmol/L (ref 135–145)

## 2015-09-19 LAB — CBC WITH DIFFERENTIAL/PLATELET
Basophils Absolute: 0 10*3/uL (ref 0–0.1)
Basophils Relative: 1 %
Eosinophils Absolute: 0.1 10*3/uL (ref 0–0.7)
Eosinophils Relative: 2 %
HEMATOCRIT: 32.2 % — AB (ref 35.0–47.0)
Hemoglobin: 10.2 g/dL — ABNORMAL LOW (ref 12.0–16.0)
LYMPHS ABS: 1.4 10*3/uL (ref 1.0–3.6)
LYMPHS PCT: 23 %
MCH: 28 pg (ref 26.0–34.0)
MCHC: 31.6 g/dL — AB (ref 32.0–36.0)
MCV: 88.5 fL (ref 80.0–100.0)
MONO ABS: 0.4 10*3/uL (ref 0.2–0.9)
MONOS PCT: 7 %
NEUTROS ABS: 4.1 10*3/uL (ref 1.4–6.5)
Neutrophils Relative %: 67 %
Platelets: 243 10*3/uL (ref 150–440)
RBC: 3.63 MIL/uL — ABNORMAL LOW (ref 3.80–5.20)
RDW: 17.2 % — AB (ref 11.5–14.5)
WBC: 6 10*3/uL (ref 3.6–11.0)

## 2015-09-19 LAB — COMPREHENSIVE METABOLIC PANEL
ALT: 15 U/L (ref 14–54)
ANION GAP: 8 (ref 5–15)
AST: 21 U/L (ref 15–41)
Albumin: 4.2 g/dL (ref 3.5–5.0)
Alkaline Phosphatase: 58 U/L (ref 38–126)
BILIRUBIN TOTAL: 0.6 mg/dL (ref 0.3–1.2)
BUN: 11 mg/dL (ref 6–20)
CO2: 25 mmol/L (ref 22–32)
Calcium: 9.6 mg/dL (ref 8.9–10.3)
Chloride: 103 mmol/L (ref 101–111)
Creatinine, Ser: 0.88 mg/dL (ref 0.44–1.00)
GFR, EST NON AFRICAN AMERICAN: 57 mL/min — AB (ref >60)
Glucose, Bld: 97 mg/dL (ref 65–99)
POTASSIUM: 3.4 mmol/L — AB (ref 3.5–5.1)
Sodium: 136 mmol/L (ref 135–145)
TOTAL PROTEIN: 7.1 g/dL (ref 6.5–8.1)

## 2015-09-19 LAB — MAGNESIUM
MAGNESIUM: 2.5 mg/dL — AB (ref 1.7–2.4)
Magnesium: 1.8 mg/dL (ref 1.7–2.4)

## 2015-09-19 LAB — TROPONIN I
TROPONIN I: 0.04 ng/mL — AB (ref <0.031)
Troponin I: 0.62 ng/mL — ABNORMAL HIGH
Troponin I: 0.82 ng/mL — ABNORMAL HIGH
Troponin I: 0.69 ng/mL — ABNORMAL HIGH

## 2015-09-19 LAB — MRSA PCR SCREENING: MRSA by PCR: NEGATIVE

## 2015-09-19 LAB — APTT: APTT: 37 s — AB (ref 24–36)

## 2015-09-19 LAB — BRAIN NATRIURETIC PEPTIDE: B Natriuretic Peptide: 331 pg/mL — ABNORMAL HIGH (ref 0.0–100.0)

## 2015-09-19 LAB — PROTIME-INR
INR: 1.95
INR: 1.39
Prothrombin Time: 22.1 seconds — ABNORMAL HIGH (ref 11.4–15.0)
Prothrombin Time: 17.2 s — ABNORMAL HIGH (ref 11.4–15.0)

## 2015-09-19 LAB — PROCALCITONIN: Procalcitonin: <0.1 ng/mL

## 2015-09-19 LAB — PHOSPHORUS: Phosphorus: 3.5 mg/dL (ref 2.5–4.6)

## 2015-09-19 MED ORDER — AMIODARONE HCL IN DEXTROSE 360-4.14 MG/200ML-% IV SOLN
60.0000 mg/h | INTRAVENOUS | Status: DC
  Administered 2015-09-19: 60 mg/h via INTRAVENOUS
  Filled 2015-09-19: qty 200

## 2015-09-19 MED ORDER — SODIUM CHLORIDE 0.9 % IV SOLN
Freq: Once | INTRAVENOUS | Status: AC
  Administered 2015-09-19: 03:00:00 via INTRAVENOUS

## 2015-09-19 MED ORDER — DEXTROSE 5 % IV SOLN
1.0000 g | Freq: Three times a day (TID) | INTRAVENOUS | Status: DC
  Filled 2015-09-19 (×2): qty 1

## 2015-09-19 MED ORDER — SENNOSIDES-DOCUSATE SODIUM 8.6-50 MG PO TABS
3.0000 | ORAL_TABLET | Freq: Every day | ORAL | Status: DC
  Administered 2015-09-19 – 2015-09-20 (×2): 3 via ORAL
  Filled 2015-09-19 (×2): qty 3

## 2015-09-19 MED ORDER — EZETIMIBE 10 MG PO TABS
10.0000 mg | ORAL_TABLET | ORAL | Status: DC
  Administered 2015-09-19 – 2015-09-21 (×2): 10 mg via ORAL
  Filled 2015-09-19 (×2): qty 1

## 2015-09-19 MED ORDER — RIVAROXABAN 20 MG PO TABS: 20.0000 mg | ORAL_TABLET | Freq: Every day | ORAL | Status: DC

## 2015-09-19 MED ORDER — TRAZODONE HCL 50 MG PO TABS
150.0000 mg | ORAL_TABLET | Freq: Every day | ORAL | Status: DC
  Administered 2015-09-19 – 2015-09-20 (×2): 150 mg via ORAL
  Filled 2015-09-19 (×2): qty 1

## 2015-09-19 MED ORDER — SODIUM CHLORIDE 0.9 % IV BOLUS (SEPSIS)
500.0000 mL | INTRAVENOUS | Status: AC
  Administered 2015-09-19: 500 mL via INTRAVENOUS

## 2015-09-19 MED ORDER — ASPIRIN EC 81 MG PO TBEC
81.0000 mg | DELAYED_RELEASE_TABLET | Freq: Every day | ORAL | Status: DC
Start: 2015-09-19 — End: 2015-09-21
  Administered 2015-09-19 – 2015-09-21 (×3): 81 mg via ORAL
  Filled 2015-09-19 (×3): qty 1

## 2015-09-19 MED ORDER — DILTIAZEM HCL 25 MG/5ML IV SOLN
INTRAVENOUS | Status: AC
  Filled 2015-09-19: qty 5

## 2015-09-19 MED ORDER — MYCOPHENOLATE MOFETIL 250 MG PO CAPS
500.0000 mg | ORAL_CAPSULE | Freq: Two times a day (BID) | ORAL | Status: DC
  Administered 2015-09-19 – 2015-09-21 (×5): 500 mg via ORAL
  Filled 2015-09-19 (×7): qty 2

## 2015-09-19 MED ORDER — ACETAMINOPHEN 325 MG PO TABS
650.0000 mg | ORAL_TABLET | Freq: Four times a day (QID) | ORAL | Status: DC | PRN
Start: 2015-09-19 — End: 2015-09-21
  Administered 2015-09-19: 650 mg via ORAL
  Filled 2015-09-19: qty 2

## 2015-09-19 MED ORDER — AMIODARONE HCL IN DEXTROSE 360-4.14 MG/200ML-% IV SOLN
30.0000 mg/h | INTRAVENOUS | Status: DC
  Administered 2015-09-19: 30 mg/h via INTRAVENOUS
  Filled 2015-09-19: qty 200

## 2015-09-19 MED ORDER — SIMVASTATIN 20 MG PO TABS
20.0000 mg | ORAL_TABLET | ORAL | Status: DC
  Administered 2015-09-19 – 2015-09-21 (×2): 20 mg via ORAL
  Filled 2015-09-19 (×2): qty 1

## 2015-09-19 MED ORDER — AMIODARONE IV BOLUS ONLY 150 MG/100ML
150.0000 mg | Freq: Once | INTRAVENOUS | Status: AC
  Administered 2015-09-19: 150 mg via INTRAVENOUS
  Filled 2015-09-19: qty 100

## 2015-09-19 MED ORDER — SODIUM CHLORIDE 0.9 % IV BOLUS (SEPSIS): 1000.0000 mL | Freq: Once | INTRAVENOUS | Status: DC

## 2015-09-19 MED ORDER — SODIUM CHLORIDE 0.9 % IV SOLN: 250.0000 mL | INTRAVENOUS | Status: DC | PRN

## 2015-09-19 MED ORDER — VALACYCLOVIR HCL 500 MG PO TABS
1000.0000 mg | ORAL_TABLET | Freq: Every evening | ORAL | Status: DC
  Administered 2015-09-19 – 2015-09-20 (×2): 1000 mg via ORAL
  Filled 2015-09-19 (×2): qty 2

## 2015-09-19 MED ORDER — DEXTROSE 5 % IV SOLN
1.0000 g | Freq: Once | INTRAVENOUS | Status: AC
  Administered 2015-09-19: 1 g via INTRAVENOUS
  Filled 2015-09-19: qty 1

## 2015-09-19 MED ORDER — MIRTAZAPINE 15 MG PO TABS
7.5000 mg | ORAL_TABLET | Freq: Every day | ORAL | Status: DC
  Administered 2015-09-19 – 2015-09-20 (×2): 7.5 mg via ORAL
  Filled 2015-09-19 (×2): qty 1

## 2015-09-19 MED ORDER — ONDANSETRON HCL 4 MG/2ML IJ SOLN: 4.0000 mg | Freq: Four times a day (QID) | INTRAMUSCULAR | Status: DC | PRN

## 2015-09-19 MED ORDER — RIVAROXABAN 15 MG PO TABS
15.0000 mg | ORAL_TABLET | Freq: Every day | ORAL | Status: DC
  Administered 2015-09-19 – 2015-09-20 (×2): 15 mg via ORAL
  Filled 2015-09-19 (×2): qty 1

## 2015-09-19 MED ORDER — PREDNISONE 2.5 MG PO TABS
5.0000 mg | ORAL_TABLET | Freq: Every day | ORAL | Status: DC
  Administered 2015-09-19 – 2015-09-21 (×3): 5 mg via ORAL
  Filled 2015-09-19: qty 2
  Filled 2015-09-19: qty 1
  Filled 2015-09-19: qty 2

## 2015-09-19 MED ORDER — SODIUM CHLORIDE 0.9 % IV SOLN: INTRAVENOUS | Status: AC

## 2015-09-19 MED ORDER — METOPROLOL TARTRATE 25 MG PO TABS
25.0000 mg | ORAL_TABLET | Freq: Two times a day (BID) | ORAL | Status: DC
  Administered 2015-09-19 – 2015-09-21 (×5): 25 mg via ORAL
  Filled 2015-09-19 (×5): qty 1

## 2015-09-19 MED ORDER — GABAPENTIN 100 MG PO CAPS
100.0000 mg | ORAL_CAPSULE | Freq: Two times a day (BID) | ORAL | Status: DC
  Administered 2015-09-19 – 2015-09-21 (×5): 100 mg via ORAL
  Filled 2015-09-19 (×5): qty 1

## 2015-09-19 MED ORDER — SODIUM CHLORIDE 0.9 % IV BOLUS (SEPSIS)
500.0000 mL | INTRAVENOUS | Status: AC
Start: 2015-09-19 — End: 2015-09-19
  Administered 2015-09-19: 500 mL via INTRAVENOUS

## 2015-09-19 MED ORDER — MAGNESIUM SULFATE 2 GM/50ML IV SOLN
2.0000 g | Freq: Once | INTRAVENOUS | Status: AC
  Administered 2015-09-19: 2 g via INTRAVENOUS
  Filled 2015-09-19: qty 50

## 2015-09-19 MED ORDER — NITROGLYCERIN 0.4 MG SL SUBL: 0.4000 mg | SUBLINGUAL_TABLET | SUBLINGUAL | Status: DC | PRN

## 2015-09-19 MED ORDER — EZETIMIBE-SIMVASTATIN 10-20 MG PO TABS: 1.0000 | ORAL_TABLET | ORAL | Status: DC

## 2015-09-19 NOTE — ED Notes (Addendum)
Pt states she had a "normal day".  When she went to bed she started to feel "bad" with elevated BP and HR.  She woke up her husband who called EMS. BIB  EMS,  A&O X4, denies pain or pain in chest.  Denies SOB.

## 2015-09-19 NOTE — ED Notes (Signed)
Family at bedside. Pt resting, denies pain, SOB

## 2015-09-19 NOTE — Progress Notes (Addendum)
Pharmacy Antibiotic Note  Brenda Roman is a 82 y.o. female admitted on 09/19/2015 with UTI.  Pharmacy has been consulted for aztreonam dosing.  No admission note available at this time  Plan: Aztreonam 1gm IV Q8H  Height: 5\' 2"  (157.5 cm) Weight: 123 lb 10.9 oz (56.1 kg) IBW/kg (Calculated) : 50.1  Temp (24hrs), Avg:98 F (36.7 C), Min:97.7 F (36.5 C), Max:98.2 F (36.8 C)   Recent Labs Lab 09/19/15 0057 09/19/15 0619  WBC 6.0 5.3  CREATININE 0.88 0.80    Estimated Creatinine Clearance: 37.7 mL/min (by C-G formula based on Cr of 0.8).    Allergies  Allergen Reactions  . Cephalexin Other (See Comments)    Reaction:  Unknown  . Novocain [Procaine] Rash  . Sulfa Antibiotics Rash    Antimicrobials this admission: Aztreonam 5/29 >>    Dose adjustments this admission:   Microbiology results: 5/29 BCx:  5/29 UCx:   5/29 MRSA PCR:   Thank you for allowing pharmacy to be a part of this patient's care.  Jalien Weakland C 09/19/2015 7:44 AM  Consult discontinued, will d/c aztreonam orders.

## 2015-09-19 NOTE — Progress Notes (Signed)
Report called to Junious Dresser, RN on  2A. Pt in stable condition at this time. No complaints of pain with me. All belongings going with pt.  Pt's troponin positive at 0800 at 0.62. Dr.Ram made aware at that time. Troponin at 1230 elevated to 0.82. [place], NP notified.   No new orders given.

## 2015-09-19 NOTE — Progress Notes (Signed)
tx from [facility]. Nsr. Room air. Takes meds ok. Call bell was lined with a feeling sensor so that she can use the call bell. Pt has not reported any pain. NS @ 100. Pt asks for meal set up due to being legally blind. Pt has no further concerns at this time.

## 2015-09-19 NOTE — Consult Note (Signed)
[facility] Cardiology  CARDIOLOGY CONSULT NOTE  Patient ID: Brenda Roman MRN: [id number] DOB/AGE: 08/16/1923 82 y.o.  Admit date: 09/19/2015 Referring Physician Ashby Dawes Primary Physician [facility] Primary Cardiologist Fath Reason for Consultation Atrial fibrillation  HPI: 82 year old female referred for atrial fibrillation. The patient is status post liver transplant for primary biliary cirrhosis, with history of coronary artery disease, status post prior coronary stents, moderate severe aortic stenosis, paroxysmal atrial fibrillation. The patient reports she had a good day yesterday, went to bed, then experienced chest discomfort, called her husband, who eventually called EMS. The patient was noted to be tachycardic, brought to [facility] emergency room where she was noted be in wide complex tachycardia, atrial fibrillation with underlying left bundle branch block with rates in the 140s. Patient was initially treated with amiodarone bolus and drip, and developed hypotension. Patient is admitted to the ICU, where she has been clinically and hemodynamically stable, currently in atrial fibrillation with a controlled ventricular rate. The patient currently denies chest pain, and wishes to go home. Admission labs were notable for a mildly elevated troponin of 0.04. Follow-up troponin was 0.62.  Review of systems complete and found to be negative unless listed above     Past Medical History  Diagnosis Date  . Liver transplanted ([place])   . Macular degeneration   . Coronary artery disease   . Hypertension   . Shingles   . CHF (congestive heart failure) ([place])   . TIA (transient ischemic attack)   . 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 Aortic stenosis   . Mitral valve disease   . PBC (primary biliary cirrhosis) ([place])     s/p liver transplant  . CVA (cerebral infarction) June '16  . Cancer [facility])     skin    Past Surgical History  Procedure Laterality Date  . Coronary artery bypass graft    . Joint  replacement    . Bowel resection    . Colonoscopy    . Liver transplant  1990    Prescriptions prior to admission  Medication Sig Dispense Refill Last Dose  . acetaminophen (TYLENOL) 325 MG tablet Take 2 tablets by mouth every 6 (six) hours as needed.   prn at prn  . aspirin EC 81 MG tablet Take 81 mg by mouth daily.   09/18/2015 at Unknown time  . conjugated estrogens (PREMARIN) vaginal cream Place 0.5 g vaginally 3 (three) times a week.   Past Week at Unknown time  . Cranberry 250 MG TABS Take 250 mg by mouth 2 (two) times daily.   09/18/2015 at Unknown time  . ezetimibe-simvastatin (VYTORIN) 10-20 MG tablet Take 1 tablet by mouth 3 (three) times a week. Patient takes on Monday, Wednesday, Friday   09/18/2015 at Unknown time  . gabapentin (NEURONTIN) 100 MG capsule Take 100 mg by mouth 2 (two) times daily.    09/18/2015 at Unknown time  . Melatonin 3 MG TABS Take 1 tablet by mouth at bedtime.   09/18/2015 at Unknown time  . mirtazapine (REMERON) 7.5 MG tablet Take 7.5 mg by mouth at bedtime.   09/18/2015 at Unknown time  . mycophenolate (CELLCEPT) 500 MG tablet Take 500 mg by mouth every 12 (twelve) hours.   09/18/2015 at Unknown time  . nitroGLYCERIN (NITROSTAT) 0.4 MG SL tablet Place 1 tablet under the tongue as needed.   prn at prn  . predniSONE (DELTASONE) 5 MG tablet Take 5 mg by mouth daily.   09/18/2015 at Unknown time  .  rivaroxaban (XARELTO) 20 MG TABS tablet Take 20 mg by mouth daily with supper.   09/18/2015 at Unknown time  . senna-docusate (SENOKOT-S) 8.6-50 MG per tablet Take 3 tablets by mouth at bedtime.    09/18/2015 at Unknown time  . traZODone (DESYREL) 100 MG tablet Take 150 mg by mouth at bedtime.   09/18/2015 at Unknown time  . valACYclovir (VALTREX) 1000 MG tablet Take 1,000 mg by mouth every evening.    09/18/2015 at Unknown time  . zaleplon (SONATA) 5 MG capsule Take 1 capsule by mouth at bedtime.   09/18/2015 at Unknown time   Social History   Social History  . Marital  Status: Married    Spouse Name: N/A  . Number of Children: N/A  . Years of Education: N/A   Occupational History  . Not on file.   Social History Main Topics  . Smoking status: Former Research scientist (life sciences)  . Smokeless tobacco: Not on file  . Alcohol Use: No  . Drug Use: No  . Sexual Activity: No   Other Topics Concern  . Not on file   Social History Narrative    Family History  Problem Relation Age of Onset  . Hypertension Father       Review of systems complete and found to be negative unless listed above      PHYSICAL EXAM  General: Well developed, well nourished, in no acute distress HEENT:  Normocephalic and atramatic Neck:  No JVD.  Lungs: Clear bilaterally to auscultation and percussion. Heart: HRRR . Normal S1 and S2 without gallops or murmurs.  Abdomen: Bowel sounds are positive, abdomen soft and non-tender  Msk:  Back normal, normal gait. Normal strength and tone for age. Extremities: No clubbing, cyanosis or edema.   Neuro: Alert and oriented X 3. Psych:  Good affect, responds appropriately  Labs:   Lab Results  Component Value Date   WBC 5.3 09/19/2015   HGB 8.9* 09/19/2015   HCT 28.1* 09/19/2015   MCV 87.8 09/19/2015   PLT 222 09/19/2015    Recent Labs Lab 09/19/15 0057 09/19/15 0619  NA 136 137  K 3.4* 3.6  CL 103 107  CO2 25 24  BUN 11 10  CREATININE 0.88 0.80  CALCIUM 9.6 8.7*  PROT 7.1  --   BILITOT 0.6  --   ALKPHOS 58  --   ALT 15  --   AST 21  --   GLUCOSE 97 94   Lab Results  Component Value Date   CKTOTAL 71 12/26/2012   CKMB 2.2 12/26/2012   TROPONINI 0.62* 09/19/2015    Lab Results  Component Value Date   CHOL 132 09/07/2014   CHOL 157 08/23/2014   CHOL 150 12/27/2012   Lab Results  Component Value Date   HDL 25* 09/07/2014   HDL 30* 08/23/2014   HDL 44 12/27/2012   Lab Results  Component Value Date   LDLCALC 80 09/07/2014   LDLCALC 107* 08/23/2014   LDLCALC 80 12/27/2012   Lab Results  Component Value Date    TRIG 134 09/07/2014   TRIG 99 08/23/2014   TRIG 129 12/27/2012   Lab Results  Component Value Date   CHOLHDL 5.3 09/07/2014   CHOLHDL 5.2 08/23/2014   No results found for: LDLDIRECT    Radiology: Ct Abdomen Pelvis W Contrast  09/08/2015  CLINICAL DATA:  82 year old female with a history of left lower quadrant pain starting last evening. Worsening. Surgical history of liver transplant, colon resection, appendectomy  EXAM: CT ABDOMEN AND PELVIS WITH CONTRAST TECHNIQUE: Multidetector CT imaging of the abdomen and pelvis was performed using the standard protocol following bolus administration of intravenous contrast. CONTRAST:  172mL ISOVUE-300 IOPAMIDOL (ISOVUE-300) INJECTION 61% COMPARISON:  CT 12/26/2014 FINDINGS: Lower chest: Unremarkable appearance of the soft tissues of the chest wall. Cardiomegaly again noted. Calcifications of aortic valve. Calcifications of mitral annulus. Calcifications in the distribution the right coronary artery. Moderate-sized hiatal hernia. No confluent airspace disease or pleural effusion. Linear opacities bilateral lung bases, compatible with scarring/atelectasis. Abdomen/pelvis: Unremarkable appearance of liver, with re- demonstration of gas in the biliary system. Surgical clips at the liver hilum, compatible with history of cholecystectomy and orthotopic liver transplant. Unremarkable spleen. Unremarkable bilateral adrenal glands. Unremarkable pancreas. Bilateral kidneys demonstrate no evidence of nephrolithiasis or hydronephrosis. Unremarkable course of the bilateral ureters. Bilateral rounded hypodense lesions of the kidneys, majority of which are too small to characterize by CT. Cyst of the superior left kidney with fluid density measures 2.8 cm, compatible with Bosniak 1 cyst. No abnormally distended colon or small bowel. Extensive colonic diverticula without associated inflammatory changes. No free fluid or free intraperitoneal air. Nonspecific pelvic floor laxity.  Unremarkable appearance of the urinary bladder. Fibroid changes of uterus, with unremarkable appearance of the adnexa. Compression fracture of T12, L2, L3, L5. With 50% height loss at each level. No fracture line identified at any of these levels. Configuration unchanged when compared to prior CT of 2016. Vascular calcifications of the abdominal aorta and mesenteric vessels. Mesenteric vessels are patent at the origin, including bilateral renal arteries. Surgical changes of prior right hip fixation. No displaced fracture. Osteopenia. IMPRESSION: No acute finding identified to account for the patient's left-sided abdominal pain. Colonic diverticular disease, with no evidence on the current CT of acute diverticulitis. Atherosclerosis. Re- demonstration of pneumobilia in this patient with a history of orthotopic liver transplant. Hiatal hernia. Aortic valve calcifications and mitral annular calcifications, incompletely imaged. Signed, Dulcy Fanny. Earleen Newport, DO [facility] Radiology Electronically Signed   By: Corrie Mckusick D.O.   On: 09/08/2015 12:26   Dg Chest Portable 1 View  09/19/2015  CLINICAL DATA:  Acute onset of generalized chest pain and tachycardia. Initial encounter. EXAM: PORTABLE CHEST 1 VIEW COMPARISON:  Chest radiograph performed 07/18/2015 FINDINGS: The lungs are well-aerated. Vascular congestion is noted. Peribronchial thickening is seen. There is no evidence of focal opacification, pleural effusion or pneumothorax. The cardiomediastinal silhouette is mildly enlarged. No acute osseous abnormalities are seen. External pacing pads are noted. Postoperative change is noted about the left side of the mediastinum. Clips are noted within the right upper quadrant, reflecting prior cholecystectomy. IMPRESSION: Vascular congestion noted. Peribronchial thickening seen. Mild cardiomegaly. Electronically Signed   By: Garald Balding M.D.   On: 09/19/2015 01:26    EKG:  Atrial fibrillation  ASSESSMENT AND PLAN:   1. Paroxysmal atrial fibrillation, in atrial fibrillation with a controlled ventricular rate, asymptomatic, without chest pain. 2. Mild elevation in troponin, likely due to demand supply ischemia, in the setting of atrial fibrillation with a rapid ventricular rate  Recommendations  1. Agree with current therapy 2. Continue Xarelto for stroke prevention 3. Add low-dose metoprolol for rate control 4. Defer further cardiac diagnostics at this time 5. May transfer to telemetry  Signed: [place] MD,PhD, [facility] 09/19/2015, 9:49 AM

## 2015-09-19 NOTE — ED Notes (Signed)
Patient denies pain and is resting comfortably.  

## 2015-09-19 NOTE — ED Notes (Signed)
Pt resting in bed, spouse at [facility], denies needs at this time

## 2015-09-19 NOTE — ED Notes (Signed)
Patient is resting comfortably. 

## 2015-09-19 NOTE — H&P (Signed)
PULMONARY / CRITICAL CARE MEDICINE   Name: Brenda Roman MRN: [id number] DOB: May 26, 1923    ADMISSION DATE:  09/19/2015   REFERRING MD: ED  CHIEF COMPLAINT:  Chest pain   HISTORY OF PRESENT ILLNESS:  This is an 82 yo WF with a PMH signicant for primary biliary cirrhosis status posts liver transplant, coronary artery disease, hypertension, congestive heart failure, CVA/GI 8, aortic stenosis, mitral valve disease, and skin cancer who presents with sudden onset chest pain. Patient states that she was in her usual state of health, went to bed and woke up with left-sided chest pain. Describes pain as sharp, localized in the left side of her chest, steady, and about 8 or 10 in intensity at onset. She denies any associated symptoms, and no aggravating symptoms. Pain is relieved with interventions she received in the emergency room. She is currently pain-free. In the ED, patient was found to be in ventricular tachycardia with rates in the 140s. She was started on amiodarone. However, she became hypotensive, necessitating fluid boluses. There was concern that patient might deteriorate hemodynamically, hence PCCM was consulted for ICU admission. Her blood pressure has improved and currently 105/55 with a map of 70. Her heart rate has improved; now in the 70s.  PAST MEDICAL HISTORY :  She  has a past medical history of Liver transplanted ([place]); Macular degeneration; Coronary artery disease; Hypertension; Shingles; CHF (congestive heart failure) ([place]); TIA (transient ischemic attack); Blind; Aortic stenosis; Mitral valve disease; PBC (primary biliary cirrhosis) ([place]); CVA (cerebral infarction) (June '16); and Cancer [facility]).  PAST SURGICAL HISTORY: She  has past surgical history that includes Coronary artery bypass graft; Joint replacement; Bowel resection; Colonoscopy; and Liver transplant (1990).  Allergies  Allergen Reactions  . Cephalexin Other (See Comments)    Reaction:  Unknown  . Novocain  [Procaine] Rash  . Sulfa Antibiotics Rash    No current facility-administered medications on file prior to encounter.   Current Outpatient Prescriptions on File Prior to Encounter  Medication Sig  . aspirin EC 81 MG tablet Take 81 mg by mouth daily.  . Cranberry 250 MG TABS Take 250 mg by mouth 2 (two) times daily.  Marland Kitchen ezetimibe-simvastatin (VYTORIN) 10-20 MG tablet Take 1 tablet by mouth 3 (three) times a week. Patient takes on Monday, Wednesday, Friday  . gabapentin (NEURONTIN) 100 MG capsule Take 100 mg by mouth 2 (two) times daily.   . mirtazapine (REMERON) 7.5 MG tablet Take 7.5 mg by mouth at bedtime.  . mycophenolate (CELLCEPT) 500 MG tablet Take 500 mg by mouth every 12 (twelve) hours.  . predniSONE (DELTASONE) 5 MG tablet Take 5 mg by mouth daily.  . rivaroxaban (XARELTO) 20 MG TABS tablet Take 20 mg by mouth daily with supper.  . senna-docusate (SENOKOT-S) 8.6-50 MG per tablet Take 3 tablets by mouth at bedtime.   . traZODone (DESYREL) 100 MG tablet Take 150 mg by mouth at bedtime.  . valACYclovir (VALTREX) 1000 MG tablet Take 1,000 mg by mouth every evening.     FAMILY HISTORY:  Her has no family status information on file.   SOCIAL HISTORY: She  reports that she has quit smoking. She does not have any smokeless tobacco history on file. She reports that she does not drink alcohol or use illicit drugs.  REVIEW OF SYSTEMS:   Constitutional: Negative for fever and chills.  HENT: Negative for congestion and rhinorrhea but positive for hearing loss  Eyes: Negative for redness ; patient is legally blind  Respiratory: Negative  for shortness of breath and wheezing.  Cardiovascular: Positive for chest pain and palpitations but negative for dyspnea and PND.  Gastrointestinal: Negative  for nausea , vomiting and abdominal pain and loose stools Genitourinary: Positive for frequent UTIs,  vaginal prolapse, dysuria and urgency.  Musculoskeletal: Negative for myalgias and arthralgias.   Skin: Negative for pallor and wound.  Neurological: Negative for dizziness and headaches   SUBJECTIVE:   VITAL SIGNS: BP 124/62 mmHg  Pulse 76  Temp(Src) 97.7 F (36.5 C) (Oral)  Resp 18  Ht 5\' 2"  (1.575 m)  Wt 123 lb 10.9 oz (56.1 kg)  BMI 22.62 kg/m2  SpO2 96%  HEMODYNAMICS:    VENTILATOR SETTINGS:    INTAKE / OUTPUT: I/O last 3 completed shifts: In: -  Out: 300 [Urine:300]  PHYSICAL EXAMINATION: General: Chronically ill-looking, NAD Neuro:  AAOX 3, blind, hard of hearing, no other deficits HEENT: [place]/AT, PERRLA, oral mucosa moist Cardiovascular: RRR, S1/S2, no MRG Lungs: CTAB Abdomen: NT/ND, normal BS Musculoskeletal:  +ROM Extremities: +2 pulses, no edema Skin: warm and dry  LABS:  BMET  Recent Labs Lab 09/19/15 0057 09/19/15 0619  NA 136 137  K 3.4* 3.6  CL 103 107  CO2 25 24  BUN 11 10  CREATININE 0.88 0.80  GLUCOSE 97 94    Electrolytes  Recent Labs Lab 09/19/15 0057 09/19/15 0619  CALCIUM 9.6 8.7*  MG 1.8 2.5*  PHOS  --  3.5    CBC  Recent Labs Lab 09/19/15 0057 09/19/15 0619  WBC 6.0 5.3  HGB 10.2* 8.9*  HCT 32.2* 28.1*  PLT 243 222    Coag's  Recent Labs Lab 09/19/15 0057 09/19/15 0619  APTT 37*  --   INR 1.95 1.39    Sepsis Markers No results for input(s): LATICACIDVEN, PROCALCITON, O2SATVEN in the last 168 hours.  ABG No results for input(s): PHART, PCO2ART, PO2ART in the last 168 hours.  Liver Enzymes  Recent Labs Lab 09/19/15 0057  AST 21  ALT 15  ALKPHOS 58  BILITOT 0.6  ALBUMIN 4.2    Cardiac Enzymes  Recent Labs Lab 09/19/15 0057 09/19/15 0619  TROPONINI 0.04* 0.62*    Glucose No results for input(s): GLUCAP in the last 168 hours.  Imaging Dg Chest Portable 1 View  09/19/2015  CLINICAL DATA:  Acute onset of generalized chest pain and tachycardia. Initial encounter. EXAM: PORTABLE CHEST 1 VIEW COMPARISON:  Chest radiograph performed 07/18/2015 FINDINGS: The lungs are well-aerated.  Vascular congestion is noted. Peribronchial thickening is seen. There is no evidence of focal opacification, pleural effusion or pneumothorax. The cardiomediastinal silhouette is mildly enlarged. No acute osseous abnormalities are seen. External pacing pads are noted. Postoperative change is noted about the left side of the mediastinum. Clips are noted within the right upper quadrant, reflecting prior cholecystectomy. IMPRESSION: Vascular congestion noted. Peribronchial thickening seen. Mild cardiomegaly. Electronically Signed   By: Garald Balding M.D.   On: 09/19/2015 01:26    STUDIES:  ECHO 07/19/15 LVEF 60-65%, AR, MR  CULTURES: Blood cultures Urine culture  ANTIBIOTICS: Aztreonam given in the ED  SIGNIFICANT EVENTS: 09/19/2015: ED with chest pain; admitted with UTI, ventricular tachycardia and hypotension  LINES/TUBES: PIVs  DISCUSSION: 82 yo female presenting with chest ian, ventricular tachycardia, UTI and hypotension  ASSESSMENT / PLAN:  PULMONARY A: No acute issues P:   -Supplemental O2  CARDIOVASCULAR A:  New onset ventricular tachycardia Chest pain-resolved H/O AFIB H/O HTN CAD P:  -Hemodynamics per ICU protocol -Cardiology consult -IV  fluids and fluid boluses to maintain MAP>65 -Will consider low dose pressors if BP is refractory to fluid resuscitation -Cycle troponin -Repeat EKG  RENAL A:   Hyopkalemia P:   -Monitor and replace electrolytes  GASTROINTESTINAL A:   H/o Primary biliary cirrhosis s/p liver transplant P:   -Continue prednisone and cellcept   INFECTIOUS A:   Recurrent UTIs secondary to vaginal prolapse P:   -D/C antibiotic as PCT is <0.1 -F/U cultures   NEUROLOGIC A:   H/o CVA and TIA H/O Insomnia P:   -Continue xarelto and ASA -Trazadone   Disposition and family update: Patient and husband updated at bedside. Patient is medically stable for transfer out of the ICU  Best Practice: Code Status:  Full. Diet: Heart  Healthy / Carb Mod. GI prophylaxis:  Not indicated. VTE prophylaxis:  SCD's / xarelto  [facility] ANP-BC Pulmonary and Critical Care Medicine [facility] Pager [phone] or [phone]   09/19/2015, 7:27 AM  Patient seen and examined with NP, agree with assessment and plan. The patient presented to the intensive care unit with acute tachycardia, suspect A. fib with rapid ventricular rate. Subsequently, the patient was started on amiodarone with a slight drop in blood pressure, the patient's blood pressure subsequently responded to fluid challenge. Since that time. The patient has not required pressors, is not currently on any pressure support with good blood pressures. Mental status is improved today, lungs are clear to auscultation bilaterally.  Marda Stalker M.D. 09/19/2015   Critical Care Attestation.  I have personally obtained a history, examined the patient, evaluated laboratory and imaging results, formulated the assessment and plan and placed orders. The Patient requires high complexity decision making for assessment and support, frequent evaluation and titration of therapies, application of advanced monitoring technologies and extensive interpretation of multiple databases. The patient has critical illness that could lead imminently to failure of 1 or more organ systems and requires the highest level of physician preparedness to intervene.  Critical Care Time devoted to patient care services described in this note is 35 minutes and is exclusive of time spent in procedures.

## 2015-09-19 NOTE — ED Notes (Addendum)
Family at bedside. Pt resting, no needs

## 2015-09-19 NOTE — ED Provider Notes (Addendum)
[facility] Emergency Department Provider Note  ____________________________________________  Time seen: Approximately 12:42 AM  I have reviewed the triage vital signs and the nursing notes.   HISTORY  Chief Complaint Tachycardia  Patient is hard of hearing and visually impaired  HPI Brenda Roman is a 82 y.o. female with a complicated medical history that includes status post liver transplant on immunosuppressive medication, CHF, CVA, aortic stenosis, mitral valve disease, and "irregular heartbeat" that may be paroxysmal atrial fibrillation who is anticoagulated on Xarelto.  She reports to the emergency department by EMS for evaluation of chest pain that started acutely and was moderate in severity.  It resolved on its own but her husband noted that her blood pressure and heart rate were both elevated.  Her heart rate has been irregular and in the 140s - 160s since coming to the emergency department.  She was slightly short of breath earlier but states that her chest pain and her shortness of breath have completely resolved.  She denies fever/chills, nausea, vomiting, abdominal pain, dysuria.  Nothing in particular made her symptoms better or worse.   Past Medical History  Diagnosis Date  . Liver transplanted ([place])   . Macular degeneration   . Coronary artery disease   . Hypertension   . Shingles   . CHF (congestive heart failure) ([place])   . TIA (transient ischemic attack)   . 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 Aortic stenosis   . Mitral valve disease   . PBC (primary biliary cirrhosis) ([place])     s/p liver transplant  . CVA (cerebral infarction) June '16  . Cancer [facility])     skin    Patient Active Problem List   Diagnosis Date Noted  . Hyponatremia 07/30/2015  . Acute CHF (congestive heart failure) ([place]) 07/18/2015  . 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 Closed wedge fracture of lumbar vertebra ([place]) 10/11/2014  . Ischemic stroke ([place])  10/07/2014  . Acute urinary tract infection 10/07/2014  . 2Nd nerve palsy 10/07/2014  . Closed fracture of humerus, surgical neck 09/29/2014  . Goals of care, counseling/discussion   . Acute encephalopathy 08/29/2014  . Hypoxia 08/29/2014  . Healthcare-associated pneumonia 08/29/2014  . Pneumonia 08/29/2014  . CVA (cerebral infarction) 08/23/2014  . Arteriosclerosis of coronary artery 03/15/2014  . Paroxysmal atrial fibrillation ([place]) 03/15/2014  . Atherosclerosis of abdominal aorta ([place]) 01/27/2014  . HLD (hyperlipidemia) 04/07/2012  . BP (high blood pressure) 04/07/2012  . History of liver transplant ([place]) 04/03/2012  . Encounter for other procedures for purposes other than remedying health state 04/03/2012  . Prolapse of urethra 03/10/2012  . Pelvic relaxation due to uterovaginal prolapse 03/10/2012    Past Surgical History  Procedure Laterality Date  . Coronary artery bypass graft    . Joint replacement    . Bowel resection    . Colonoscopy    . Liver transplant  1990    Current Outpatient Rx  Name  Route  Sig  Dispense  Refill  . acetaminophen (TYLENOL) 325 MG tablet   Oral   Take 2 tablets by mouth every 6 (six) hours as needed.         Marland Kitchen aspirin EC 81 MG tablet   Oral   Take 81 mg by mouth daily.         Marland Kitchen conjugated estrogens (PREMARIN) vaginal cream   Vaginal   Place 0.5 g vaginally 3 (three) times a week.         Marland Kitchen  Cranberry 250 MG TABS   Oral   Take 250 mg by mouth 2 (two) times daily.         Marland Kitchen ezetimibe-simvastatin (VYTORIN) 10-20 MG tablet   Oral   Take 1 tablet by mouth 3 (three) times a week. Patient takes on Monday, Wednesday, Friday         . gabapentin (NEURONTIN) 100 MG capsule   Oral   Take 100 mg by mouth 2 (two) times daily.          . Melatonin 3 MG TABS   Oral   Take 1 tablet by mouth at bedtime.         . mirtazapine (REMERON) 7.5 MG tablet   Oral   Take 7.5 mg by mouth at bedtime.         . mycophenolate  (CELLCEPT) 500 MG tablet   Oral   Take 500 mg by mouth every 12 (twelve) hours.         . nitroGLYCERIN (NITROSTAT) 0.4 MG SL tablet   Sublingual   Place 1 tablet under the tongue as needed.         . predniSONE (DELTASONE) 5 MG tablet   Oral   Take 5 mg by mouth daily.         . rivaroxaban (XARELTO) 20 MG TABS tablet   Oral   Take 20 mg by mouth daily with supper.         . senna-docusate (SENOKOT-S) 8.6-50 MG per tablet   Oral   Take 3 tablets by mouth at bedtime.          . traZODone (DESYREL) 100 MG tablet   Oral   Take 150 mg by mouth at bedtime.         . valACYclovir (VALTREX) 1000 MG tablet   Oral   Take 1,000 mg by mouth every evening.          . zaleplon (SONATA) 5 MG capsule   Oral   Take 1 capsule by mouth at bedtime.           Allergies Cephalexin; Novocain; and Sulfa antibiotics  Family History  Problem Relation Age of Onset  . Hypertension Father     Social History Social History  Substance Use Topics  . Smoking status: Former Research scientist (life sciences)  . Smokeless tobacco: None  . Alcohol Use: No    Review of Systems Constitutional: No fever/chills Eyes: No visual changes. ENT: No sore throat. Cardiovascular: +chest pain, palpitations, rapid heart beat Respiratory: +shortness of breath initially with the chest pain Gastrointestinal: No abdominal pain.  No nausea, no vomiting.  No diarrhea.  No constipation. Genitourinary: Negative for dysuria. Musculoskeletal: Negative for back pain. Skin: Negative for rash. Neurological: Negative for headaches, focal weakness or numbness.  10-point ROS otherwise negative.  ____________________________________________   PHYSICAL EXAM:  VITAL SIGNS: ED Triage Vitals  Enc Vitals Group     BP 09/19/15 0037 97/80 mmHg     Pulse Rate 09/19/15 0037 153     Resp --      Temp 09/19/15 0037 98 F (36.7 C)     Temp Source 09/19/15 0037 Oral     SpO2 09/19/15 0037 96 %     Weight 09/19/15 0037 120 lb  (54.432 kg)     Height 09/19/15 0037 5\' 2"  (1.575 m)     Head Cir --      Peak Flow --      Pain Score 09/19/15 0039 0  Pain Loc --      Pain Edu? --      Excl. in [place]? --     Constitutional: Alert and oriented. No acute distress. Eyes: Conjunctivae are normal. PERRL. EOMI. Head: Atraumatic. Nose: No congestion/rhinnorhea. Mouth/Throat: Mucous membranes are moist.  Oropharynx non-erythematous. Neck: No stridor.  No meningeal signs.   Cardiovascular: Irregularly irregular rhythm with tachycardia in the 140s-160s, most commonly in the 150s  Respiratory: Normal respiratory effort.  No retractions. Lungs CTAB. Gastrointestinal: Soft and nontender. No distention.  Musculoskeletal: No lower extremity tenderness nor edema. No gross deformities of extremities. Neurologic:  Normal speech and language. No gross focal neurologic deficits are appreciated. Hard of hearing and legally blind (all chronic) Skin:  Skin is warm, dry and intact. No rash noted. Psychiatric: Mood and affect are normal. Speech and behavior are normal.  ____________________________________________   LABS (all labs ordered are listed, but only abnormal results are displayed)  Labs Reviewed  CBC WITH DIFFERENTIAL/PLATELET - Abnormal; Notable for the following:    RBC 3.63 (*)    Hemoglobin 10.2 (*)    HCT 32.2 (*)    MCHC 31.6 (*)    RDW 17.2 (*)    All other components within normal limits  COMPREHENSIVE METABOLIC PANEL - Abnormal; Notable for the following:    Potassium 3.4 (*)    GFR calc non Af Amer 57 (*)    All other components within normal limits  TROPONIN I - Abnormal; Notable for the following:    Troponin I 0.04 (*)    All other components within normal limits  PROTIME-INR - Abnormal; Notable for the following:    Prothrombin Time 22.1 (*)    All other components within normal limits  APTT - Abnormal; Notable for the following:    aPTT 37 (*)    All other components within normal limits   URINALYSIS COMPLETEWITH MICROSCOPIC ([facility] ONLY) - Abnormal; Notable for the following:    Color, Urine YELLOW (*)    APPearance HAZY (*)    Ketones, ur TRACE (*)    Hgb urine dipstick 1+ (*)    Leukocytes, UA 3+ (*)    Bacteria, UA RARE (*)    All other components within normal limits  URINE CULTURE  MAGNESIUM   ____________________________________________  EKG  ED ECG REPORT #1 I, Myana Schlup, the attending physician, personally viewed and interpreted this ECG.   Date: 09/19/2015  EKG Time: 00:26  Rate: 147  Rhythm: Wide complex tachycardia, suspect atrial fibrillation with RVR widened QRS  Axis: Normal  Intervals:nonspecific intraventricular conduction delay, patient has history of left bundle branch block  ST&T Change: Non-specific ST segment / T-wave changes, does not meet STEMI criteria   ED ECG REPORT #2 I, Exie Chrismer, the attending physician, personally viewed and interpreted this ECG.   Date: 09/19/2015  EKG Time: 00:47  Rate: 150  Rhythm: Wide complex tachycardia with likely underlying atrial fibrillation with RVR and widened QRS  Axis: Normal  Intervals:nonspecific intraventricular conduction delay with history of left bundle branch block  ST&T Change: Non-specific ST segment / T-wave changes, no significant change from first EKG ____________________________________________  RADIOLOGY   Dg Chest Portable 1 View  09/19/2015  CLINICAL DATA:  Acute onset of generalized chest pain and tachycardia. Initial encounter. EXAM: PORTABLE CHEST 1 VIEW COMPARISON:  Chest radiograph performed 07/18/2015 FINDINGS: The lungs are well-aerated. Vascular congestion is noted. Peribronchial thickening is seen. There is no evidence of focal opacification, pleural effusion or pneumothorax. The  cardiomediastinal silhouette is mildly enlarged. No acute osseous abnormalities are seen. External pacing pads are noted. Postoperative change is noted about the left side of the  mediastinum. Clips are noted within the right upper quadrant, reflecting prior cholecystectomy. IMPRESSION: Vascular congestion noted. Peribronchial thickening seen. Mild cardiomegaly. Electronically Signed   By: Garald Balding M.D.   On: 09/19/2015 01:26    ____________________________________________   PROCEDURES  Procedure(s) performed: none  Critical Care performed: Yes, see critical care note(s)  CRITICAL CARE Performed by: Hinda Kehr   Total critical care time: 60 minutes  Critical care time was exclusive of separately billable procedures and treating other patients.  Critical care was necessary to treat or prevent imminent or life-threatening deterioration.  Critical care was time spent personally by me on the following activities: development of treatment plan with patient and/or surrogate as well as nursing, discussions with consultants, evaluation of patient's response to treatment, examination of patient, obtaining history from patient or surrogate, ordering and performing treatments and interventions, ordering and review of laboratory studies, ordering and review of radiographic studies, pulse oximetry and re-evaluation of patient's condition.  ____________________________________________   INITIAL IMPRESSION / ASSESSMENT AND PLAN / ED COURSE  Pertinent labs & imaging results that were available during my care of the patient were reviewed by me and considered in my medical decision making (see chart for details).  The patient has a complicated medical history: Cardiac problems and had an acute onset of chest pain and is now in a wide complex irregular tachycardia.  She is stable with a low but appropriate blood pressure.  I am concerned, however, about her EKG and the irregularly irregular wide complex rhythm.  I am starting a 500 mL normal saline bag of fluids, giving magnesium sulfate 2 g IV, and giving amiodarone 150 mg IV followed by a 1 mg/m infusion.  I explained  to her husband that her blood pressure may drop but I think this would be the safest move at this time for her, more appropriate given the EKG findings than diltiazem.  I will monitor her closely.  Basic labs are pending.  ----------------------------------------- 2:24 AM on 09/19/2015 -----------------------------------------  The patient's heart rate has improved but is still very erratic.  She will be down in the 80s to 90s and then spike up into the 130s but then settle down around 100.  She continues to not complain of any chest pain or shortness of breath.  I will continue the amiodarone infusion and admit her.  She has a significant urinary tract infection which is likely causing or at least contributing to her acute cardiac abnormalities.  Her urine culture in the past showed Klebsiella and she has a listed allergy to Keflex and her husband does not remember what her reaction was.  I will give her aztreonam IV as an initial dose will her current urine culture is pending.  ----------------------------------------- 3:39 AM on 09/19/2015 -----------------------------------------  The hospitalist has seen the patient and is concerned about the new hypotension as well as the persistent tachycardia in spite of treatment.  Given the history of CHF she is concerned about the development of acute heart failure given all the other symptoms.  She has asked me to contact the intensivist discussed the case with her given the probability that the patient will need to go to the ICU.  I have paged the intensivist.  ----------------------------------------- 3:49 AM on 09/19/2015 -----------------------------------------  The patient is persistently hypotensive and yet also asymptomatic.  The  hospitalist saw the patient but requested that I speak with the intensivist given the vital signs.  I have spoken with the nurse practitioner intensivist and she understands the situation.  As per our agreement I will  give another 500 mL normal saline bolus and she will put in orders to bring her to the ICU to determine if she needs leave fed.  We will hold off on starting pressors in the emergency department and if a central line or other invasive procedures required she will do so in the ICU.  ____________________________________________  FINAL CLINICAL IMPRESSION(S) / ED DIAGNOSES  Final diagnoses:  Atrial fibrillation with RVR (HCC)  Wide-complex tachycardia (HCC)  Elevated troponin I level  Chronic anticoagulation  Blind  Hard of hearing, bilateral  History of CVA (cerebrovascular accident)  UTI (lower urinary tract infection)     MEDICATIONS GIVEN DURING THIS VISIT:  Medications  amiodarone (NEXTERONE PREMIX) 360-4.14 MG/200ML-% (1.8 mg/mL) IV infusion (60 mg/hr Intravenous New Bag/Given 09/19/15 0115)  amiodarone (NEXTERONE PREMIX) 360-4.14 MG/200ML-% (1.8 mg/mL) IV infusion (0 mg/hr Intravenous Stopped 09/19/15 0119)  aztreonam (AZACTAM) 1 g in dextrose 5 % 50 mL IVPB   sodium chloride 0.9 % bolus 500 mL (0 mLs Intravenous Stopped 09/19/15 0201)  magnesium sulfate IVPB 2 g 50 mL (0 g Intravenous Stopped 09/19/15 0201)  amiodarone (NEXTERONE) IV bolus only 150 mg/100 mL (0 mg Intravenous Stopped 09/19/15 0115)  NS 500 mL bolus (03:48)   NEW OUTPATIENT MEDICATIONS STARTED DURING THIS VISIT:  New Prescriptions   No medications on file      Note:  This document was prepared using Dragon voice recognition software and may include unintentional dictation errors.   Hinda Kehr, MD 09/19/15 [id number]  Hinda Kehr, MD 09/19/15 [id number]

## 2015-09-20 ENCOUNTER — Encounter: Payer: Self-pay | Admitting: *Deleted

## 2015-09-20 LAB — GLUCOSE, CAPILLARY: Glucose-Capillary: 86 mg/dL (ref 65–99)

## 2015-09-20 MED ORDER — LEVOFLOXACIN IN D5W 750 MG/150ML IV SOLN
750.0000 mg | Freq: Once | INTRAVENOUS | Status: AC
  Administered 2015-09-20: 750 mg via INTRAVENOUS
  Filled 2015-09-20: qty 150

## 2015-09-20 MED ORDER — LEVOFLOXACIN IN D5W 750 MG/150ML IV SOLN: 750.0000 mg | INTRAVENOUS | Status: DC

## 2015-09-20 NOTE — Progress Notes (Signed)
[facility] [place] NAME: Brenda Roman    MR#:  [id number]  DATE OF BIRTH:  1924-03-06  SUBJECTIVE:  CHIEF COMPLAINT:   Chief Complaint  Patient presents with  . Tachycardia     Came with chest pain, , had a fib with tachycardia, resolved. Now pain free and feels much better. Found to have UTI.  REVIEW OF SYSTEMS:  CONSTITUTIONAL: No fever, fatigue or weakness.  EYES: No blurred or double vision.  EARS, NOSE, AND THROAT: No tinnitus or ear pain.  RESPIRATORY: No cough, shortness of breath, wheezing or hemoptysis.  CARDIOVASCULAR: No chest pain, orthopnea, edema.  GASTROINTESTINAL: No nausea, vomiting, diarrhea or abdominal pain.  GENITOURINARY: No dysuria, hematuria.  ENDOCRINE: No polyuria, nocturia,  HEMATOLOGY: No anemia, easy bruising or bleeding SKIN: No rash or lesion. MUSCULOSKELETAL: No joint pain or arthritis.   NEUROLOGIC: No tingling, numbness, weakness.  PSYCHIATRY: No anxiety or depression.   ROS  DRUG ALLERGIES:   Allergies  Allergen Reactions  . Cephalexin Other (See Comments)    Reaction:  Unknown  . Novocain [Procaine] Rash  . Sulfa Antibiotics Rash    VITALS:  Blood pressure 136/71, pulse 82, temperature 98.1 F (36.7 C), temperature source Oral, resp. rate 16, height 5\' 2"  (1.575 m), weight 55.112 kg (121 lb 8 oz), SpO2 98 %.  PHYSICAL EXAMINATION:  GENERAL:  82 y.o.-year-old patient lying in the bed with no acute distress.  EYES: Pupils equal, round, reactive to light ,blind in both eyes. No scleral icterus. Extraocular muscles intact.  HEENT: Head atraumatic, normocephalic. Oropharynx and nasopharynx clear.  NECK:  Supple, no jugular venous distention. No thyroid enlargement, no tenderness.  LUNGS: Normal breath sounds bilaterally, no wheezing, rales,rhonchi or crepitation. No use of accessory muscles of respiration.  CARDIOVASCULAR: S1, S2 normal. No murmurs, rubs, or gallops.  ABDOMEN: Soft,  nontender, nondistended. Bowel sounds present. No organomegaly or mass.  EXTREMITIES: No pedal edema, cyanosis, or clubbing.  NEUROLOGIC: Cranial nerves II through XII are intact. Muscle strength 5/5 in all extremities. Sensation intact. Gait not checked.  PSYCHIATRIC: The patient is alert and oriented x 3.  SKIN: No obvious rash, lesion, or ulcer.   Physical Exam LABORATORY PANEL:   CBC  Recent Labs Lab 09/19/15 0619  WBC 5.3  HGB 8.9*  HCT 28.1*  PLT 222   ------------------------------------------------------------------------------------------------------------------  Chemistries   Recent Labs Lab 09/19/15 0057 09/19/15 0619  NA 136 137  K 3.4* 3.6  CL 103 107  CO2 25 24  GLUCOSE 97 94  BUN 11 10  CREATININE 0.88 0.80  CALCIUM 9.6 8.7*  MG 1.8 2.5*  AST 21  --   ALT 15  --   ALKPHOS 58  --   BILITOT 0.6  --    ------------------------------------------------------------------------------------------------------------------  Cardiac Enzymes  Recent Labs Lab 09/19/15 1103 09/19/15 1653  TROPONINI 0.82* 0.69*   ------------------------------------------------------------------------------------------------------------------  RADIOLOGY:  Dg Chest Portable 1 View  09/19/2015  CLINICAL DATA:  Acute onset of generalized chest pain and tachycardia. Initial encounter. EXAM: PORTABLE CHEST 1 VIEW COMPARISON:  Chest radiograph performed 07/18/2015 FINDINGS: The lungs are well-aerated. Vascular congestion is noted. Peribronchial thickening is seen. There is no evidence of focal opacification, pleural effusion or pneumothorax. The cardiomediastinal silhouette is mildly enlarged. No acute osseous abnormalities are seen. External pacing pads are noted. Postoperative change is noted about the left side of the mediastinum. Clips are noted within the right upper quadrant, reflecting prior cholecystectomy. IMPRESSION: Vascular  congestion noted. Peribronchial thickening seen.  Mild cardiomegaly. Electronically Signed   By: Garald Balding M.D.   On: 09/19/2015 01:26    ASSESSMENT AND PLAN:   Active Problems:   Arrhythmia, ventricular  * A fib with RVR on presentation.    Due to UTI    Now rate controlled with IV fluids.    Cardiology had seen the pt    No further work ups.     Low dose metoprolol,, Already on Xarelto.  * Uti    Cx sent.    Allergic to cefuroxime- keep on levaquin now and wait for cx result  * hypokalemia   Replace as needed.  * primary billiary cirrhosis and s/p liver transplant    Cont prednisone and cellcept.  * hx of CVA and TIA   Cont Xarelto and ASA.  All the records are reviewed and case discussed with Care Management/Social Workerr. Management plans discussed with the patient, family and they are in agreement.  CODE STATUS: DNR  TOTAL TIME TAKING CARE OF THIS PATIENT: 35 minutes.   Pt's nephew present in room.  POSSIBLE D/C IN 1-2 DAYS, DEPENDING ON CLINICAL CONDITION.   Vaughan Basta M.D on 09/20/2015   Between 7am to 6pm - Pager - [phone]  After 6pm go to [email or web address] - password [email or web address] [facility]  Office  [phone]  CC: Primary care physician; Kirk Ruths., MD  Note: This dictation was prepared with Dragon dictation along with smaller phrase technology. Any transcriptional errors that result from this process are unintentional.

## 2015-09-20 NOTE — Progress Notes (Signed)
Alert and oriented. Family has been at bedside all day. Urine cultures are positive, so patient is receiving IV levaquin. Waiting on cultures to result specific bacteria tomorrow. Vitals are stable and patient's rhythm has stayed normal sinus. No complaints of pain. Will continue to monitor.

## 2015-09-20 NOTE — Consult Note (Signed)
Pharmacy Antibiotic Note  Brenda Roman is a 82 y.o. female admitted on 09/19/2015 with UTI.  Pharmacy has been consulted for levofloxacin dosing.  Plan: levofloxacin 750mg  IV q 48 hours  Recommend a total of 5 days of tx Pt growing ecoli in urine cx- follow up sensitivities   Height: 5\' 2"  (157.5 cm) Weight: 121 lb 8 oz (55.112 kg) IBW/kg (Calculated) : 50.1  Temp (24hrs), Avg:98 F (36.7 C), Min:97.5 F (36.4 C), Max:98.5 F (36.9 C)   Recent Labs Lab 09/19/15 0057 09/19/15 0619  WBC 6.0 5.3  CREATININE 0.88 0.80    Estimated Creatinine Clearance: 37.7 mL/min (by C-G formula based on Cr of 0.8).    Allergies  Allergen Reactions  . Cephalexin Other (See Comments)    Reaction:  Unknown  . Novocain [Procaine] Rash  . Sulfa Antibiotics Rash    Antimicrobials this admission: Aztreonam one dose levofloxacin 5/30 >>   Dose adjustments this admission:   Microbiology results: Recent Results (from the past 240 hour(s))  Urine culture     Status: Abnormal (Preliminary result)   Collection Time: 09/19/15  1:42 AM  Result Value Ref Range Status   Specimen Description URINE, CATHETERIZED  Final   Special Requests Normal  Final   Culture >=100,000 COLONIES/mL ENTEROCOCCUS SPECIES (A)  Final   Report Status PENDING  Incomplete  Culture, blood (routine x 2)     Status: None (Preliminary result)   Collection Time: 09/19/15  6:19 AM  Result Value Ref Range Status   Specimen Description BLOOD RIGHT HAND  Final   Special Requests BOTTLES DRAWN AEROBIC AND ANAEROBIC  2CC  Final   Culture NO GROWTH 1 DAY  Final   Report Status PENDING  Incomplete  Culture, blood (routine x 2)     Status: None (Preliminary result)   Collection Time: 09/19/15  6:19 AM  Result Value Ref Range Status   Specimen Description BLOOD RIGHT ANTECUBITAL  Final   Special Requests BOTTLES DRAWN AEROBIC AND ANAEROBIC  3CC  Final   Culture NO GROWTH 1 DAY  Final   Report Status PENDING  Incomplete  MRSA  PCR Screening     Status: None   Collection Time: 09/19/15  7:58 AM  Result Value Ref Range Status   MRSA by PCR NEGATIVE NEGATIVE Final    Comment:        The GeneXpert MRSA Assay (FDA approved for NASAL specimens only), is one component of a comprehensive MRSA colonization surveillance program. It is not intended to diagnose MRSA infection nor to guide or monitor treatment for MRSA infections.      Thank you for allowing pharmacy to be a part of this patient's care.  Ramond Dial 09/20/2015 3:59 PM

## 2015-09-21 LAB — URINE CULTURE: Special Requests: NORMAL

## 2015-09-21 MED ORDER — LEVOFLOXACIN 250 MG PO TABS: 250.0000 mg | ORAL_TABLET | Freq: Every day | ORAL | Status: DC

## 2015-09-21 MED ORDER — LEVOFLOXACIN 250 MG PO TABS
250.0000 mg | ORAL_TABLET | Freq: Every day | ORAL | Status: DC
  Administered 2015-09-21: 250 mg via ORAL
  Filled 2015-09-21: qty 1

## 2015-09-21 MED ORDER — METOPROLOL TARTRATE 25 MG PO TABS: 25.0000 mg | ORAL_TABLET | Freq: Two times a day (BID) | ORAL | Status: DC

## 2015-09-21 NOTE — Progress Notes (Signed)
[facility]  Patient Status: Patient's husband declined [place] services with Dr. Fritzi Mandes   If patient discharges after hours, please call [phone].   Brenda Roman 09/21/2015, 10:13 AM

## 2015-09-21 NOTE — Progress Notes (Signed)
Patient d/c'd home with husband. Education provided, no questions at this time. Patient picked up by husband and son. Telemetry removed. Brenda Roman

## 2015-09-21 NOTE — Discharge Summary (Signed)
[place] [facility] [place] NAME: Brenda Roman    MR#:  [id number]  DATE OF BIRTH:  June 24, 1923  DATE OF ADMISSION:  09/19/2015 ADMITTING PHYSICIAN: No admitting provider for patient encounter.  DATE OF DISCHARGE: 09/21/2015  PRIMARY CARE PHYSICIAN: Kirk Ruths., MD    ADMISSION DIAGNOSIS:  UTI (lower urinary tract infection) [N39.0] Wide-complex tachycardia (HCC) [I47.2] Chronic anticoagulation [Z79.01] Blind [H54.0] History of CVA (cerebrovascular accident) [Z86.73] Elevated troponin I level [R79.89] Atrial fibrillation with RVR ([place]) [I48.91] Hard of hearing, bilateral [H91.93] Hypotension, unspecified hypotension type [I95.9]  DISCHARGE DIAGNOSIS:  Paroxysmal Afib Chronic oral anticoagulation Enterococcus UTI  SECONDARY DIAGNOSIS:   Past Medical History  Diagnosis Date  . Liver transplanted ([place])   . Macular degeneration   . Coronary artery disease   . Hypertension   . Shingles   . CHF (congestive heart failure) ([place])   . TIA (transient ischemic attack)   . 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 Aortic stenosis   . Mitral valve disease   . PBC (primary biliary cirrhosis) ([place])     s/p liver transplant  . CVA (cerebral infarction) June '16  . Cancer [facility])     skin    HOSPITAL COURSE:   82 yo WF with a PMH signicant for primary biliary cirrhosis status posts liver transplant, coronary artery disease, hypertension, congestive heart failure, CVA/GI 8, aortic stenosis, mitral valve disease, and skin cancer who presents with sudden onset chest pain. Patient states that she was in her usual state of health, went to bed and woke up with left-sided chest pain  * A fib with RVR on presentation-now back to normal HR  Due to UTI  Now rate controlled with IV fluids.  Cardiology had seen the pt  No further work up.  Low dose metoprolol,, Already on Xarelto.  * Uti  Cx enterococcus sensitive to Levaquin (per  lab). Change to po levaquin  * hypokalemia  Replace as needed.  * primary billiary cirrhosis and s/p liver transplant  Cont prednisone and cellcept.  * hx of CVA and TIA  Cont Xarelto and ASA.  Overall better. D/w husband about PT. He declined it D/c home after BF CONSULTS OBTAINED:  Treatment Team:  Vaughan Basta, MD Isaias Cowman, MD  DRUG ALLERGIES:   Allergies  Allergen Reactions  . Cephalexin Other (See Comments)    Reaction:  Unknown  . Novocain [Procaine] Rash  . Sulfa Antibiotics Rash    DISCHARGE MEDICATIONS:   Current Discharge Medication List    START taking these medications   Details  levofloxacin (LEVAQUIN) 250 MG tablet Take 1 tablet (250 mg total) by mouth daily. Qty: 4 tablet, Refills: 0    metoprolol tartrate (LOPRESSOR) 25 MG tablet Take 1 tablet (25 mg total) by mouth 2 (two) times daily. Qty: 60 tablet, Refills: 1      CONTINUE these medications which have NOT CHANGED   Details  acetaminophen (TYLENOL) 325 MG tablet Take 2 tablets by mouth every 6 (six) hours as needed.    aspirin EC 81 MG tablet Take 81 mg by mouth daily.    conjugated estrogens (PREMARIN) vaginal cream Place 0.5 g vaginally 3 (three) times a week.    Cranberry 250 MG TABS Take 250 mg by mouth 2 (two) times daily.    ezetimibe-simvastatin (VYTORIN) 10-20 MG tablet Take 1 tablet by mouth 3 (three) times a week. Patient takes on Monday, Wednesday, Friday  gabapentin (NEURONTIN) 100 MG capsule Take 100 mg by mouth 2 (two) times daily.     Melatonin 3 MG TABS Take 1 tablet by mouth at bedtime.    mirtazapine (REMERON) 7.5 MG tablet Take 7.5 mg by mouth at bedtime.    mycophenolate (CELLCEPT) 500 MG tablet Take 500 mg by mouth every 12 (twelve) hours.    nitroGLYCERIN (NITROSTAT) 0.4 MG SL tablet Place 1 tablet under the tongue as needed.    predniSONE (DELTASONE) 5 MG tablet Take 5 mg by mouth daily.    rivaroxaban (XARELTO) 20 MG TABS tablet Take  20 mg by mouth daily with supper.    senna-docusate (SENOKOT-S) 8.6-50 MG per tablet Take 3 tablets by mouth at bedtime.     traZODone (DESYREL) 100 MG tablet Take 150 mg by mouth at bedtime.    valACYclovir (VALTREX) 1000 MG tablet Take 1,000 mg by mouth every evening.     zaleplon (SONATA) 5 MG capsule Take 1 capsule by mouth at bedtime.        If you experience worsening of your admission symptoms, develop shortness of breath, life threatening emergency, suicidal or homicidal thoughts you must seek medical attention immediately by calling 911 or calling your MD immediately  if symptoms less severe.  You Must read complete instructions/literature along with all the possible adverse reactions/side effects for all the Medicines you take and that have been prescribed to you. Take any new Medicines after you have completely understood and accept all the possible adverse reactions/side effects.   Please note  You were cared for by a hospitalist during your hospital stay. If you have any questions about your discharge medications or the care you received while you were in the hospital after you are discharged, you can call the unit and asked to speak with the hospitalist on call if the hospitalist that took care of you is not available. Once you are discharged, your primary care physician will handle any further medical issues. Please note that NO REFILLS for any discharge medications will be authorized once you are discharged, as it is imperative that you return to your primary care physician (or establish a relationship with a primary care physician if you do not have one) for your aftercare needs so that they can reassess your need for medications and monitor your lab values. Today   SUBJECTIVE  No new complaints   VITAL SIGNS:  Blood pressure 117/56, pulse 71, temperature 98.3 F (36.8 C), temperature source Oral, resp. rate 16, height 5\' 2"  (1.575 m), weight 53.978 kg (119 lb), SpO2 89  %.  I/O:   Intake/Output Summary (Last 24 hours) at 09/21/15 0741 Last data filed at 09/21/15 0149  Gross per 24 hour  Intake    480 ml  Output    350 ml  Net    130 ml    PHYSICAL EXAMINATION:  GENERAL:  82 y.o.-year-old patient lying in the bed with no acute distress.  EYES: Pupils equal, round, reactive to light and accommodation. No scleral icterus. Extraocular muscles intact.  HEENT: Head atraumatic, normocephalic. Oropharynx and nasopharynx clear.  NECK:  Supple, no jugular venous distention. No thyroid enlargement, no tenderness.  LUNGS: Normal breath sounds bilaterally, no wheezing, rales,rhonchi or crepitation. No use of accessory muscles of respiration.  CARDIOVASCULAR: S1, S2 normal. No murmurs, rubs, or gallops.  ABDOMEN: Soft, non-tender, non-distended. Bowel sounds present. No organomegaly or mass.  EXTREMITIES: No pedal edema, cyanosis, or clubbing.  NEUROLOGIC: Cranial nerves II through  XII are intact. Muscle strength 5/5 in all extremities. Sensation intact. Gait not checked.  PSYCHIATRIC: The patient is alert and oriented x 3.  SKIN: No obvious rash, lesion, or ulcer.   DATA REVIEW:   CBC   Recent Labs Lab 09/19/15 0619  WBC 5.3  HGB 8.9*  HCT 28.1*  PLT 222    Chemistries   Recent Labs Lab 09/19/15 0057 09/19/15 0619  NA 136 137  K 3.4* 3.6  CL 103 107  CO2 25 24  GLUCOSE 97 94  BUN 11 10  CREATININE 0.88 0.80  CALCIUM 9.6 8.7*  MG 1.8 2.5*  AST 21  --   ALT 15  --   ALKPHOS 58  --   BILITOT 0.6  --     Microbiology Results   Recent Results (from the past 240 hour(s))  Urine culture     Status: Abnormal (Preliminary result)   Collection Time: 09/19/15  1:42 AM  Result Value Ref Range Status   Specimen Description URINE, CATHETERIZED  Final   Special Requests Normal  Final   Culture >=100,000 COLONIES/mL ENTEROCOCCUS SPECIES (A)  Final   Report Status PENDING  Incomplete  Culture, blood (routine x 2)     Status: None (Preliminary  result)   Collection Time: 09/19/15  6:19 AM  Result Value Ref Range Status   Specimen Description BLOOD RIGHT HAND  Final   Special Requests BOTTLES DRAWN AEROBIC AND ANAEROBIC  2CC  Final   Culture NO GROWTH 1 DAY  Final   Report Status PENDING  Incomplete  Culture, blood (routine x 2)     Status: None (Preliminary result)   Collection Time: 09/19/15  6:19 AM  Result Value Ref Range Status   Specimen Description BLOOD RIGHT ANTECUBITAL  Final   Special Requests BOTTLES DRAWN AEROBIC AND ANAEROBIC  3CC  Final   Culture NO GROWTH 1 DAY  Final   Report Status PENDING  Incomplete  MRSA PCR Screening     Status: None   Collection Time: 09/19/15  7:58 AM  Result Value Ref Range Status   MRSA by PCR NEGATIVE NEGATIVE Final    Comment:        The GeneXpert MRSA Assay (FDA approved for NASAL specimens only), is one component of a comprehensive MRSA colonization surveillance program. It is not intended to diagnose MRSA infection nor to guide or monitor treatment for MRSA infections.     RADIOLOGY:  No results found.   Management plans discussed with the patient, family and they are in agreement.  CODE STATUS:     Code Status Orders        Start     Ordered   09/19/15 0504  Do not attempt resuscitation (DNR)   Continuous    Question Answer Comment  In the event of cardiac or respiratory ARREST Do not call a "code blue"   In the event of cardiac or respiratory ARREST Do not perform Intubation, CPR, defibrillation or ACLS   In the event of cardiac or respiratory ARREST Use medication by any route, position, wound care, and other measures to relive pain and suffering. May use oxygen, suction and manual treatment of airway obstruction as needed for comfort.      09/19/15 0504    Code Status History    Date Active Date Inactive Code Status Order ID Comments User Context   09/19/2015  4:58 AM 09/19/2015  4:58 AM Full Code [id number]  Mikael Spray, NP ED  07/30/2015  5:08 AM  08/01/2015  2:17 PM DNR [id number]  Harrie Foreman, MD Inpatient   07/18/2015  9:21 PM 07/20/2015  2:59 PM DNR [id number]  Dustin Flock, MD Inpatient   08/29/2014  9:02 PM 09/05/2014  3:27 PM DNR [id number]  Demetrios Loll, MD ED   08/23/2014  5:55 PM 08/25/2014  5:42 PM Full Code [id number]  Bettey Costa, MD Inpatient    Advance Directive Documentation        Most Recent Value   Type of Advance Directive  Healthcare Power of Attorney, Living will   Pre-existing out of facility DNR order (yellow form or pink MOST form)     "MOST" Form in Place?        TOTAL TIME TAKING CARE OF THIS PATIENT: 40 minutes.    Elmina Hendel M.D on 09/21/2015 at 7:41 AM  Between 7am to 6pm - Pager - [phone] After 6pm go to [email or web address] - password [email or web address] [place] [facility]  Office  [phone]  CC: Primary care physician; Kirk Ruths., MD

## 2015-09-24 LAB — CULTURE, BLOOD (ROUTINE X 2)
CULTURE: NO GROWTH
Culture: NO GROWTH

## 2015-10-26 ENCOUNTER — Ambulatory Visit (INDEPENDENT_AMBULATORY_CARE_PROVIDER_SITE_OTHER): Payer: Medicare Other | Admitting: Urology

## 2015-10-26 ENCOUNTER — Encounter: Payer: Self-pay | Admitting: Urology

## 2015-10-26 VITALS — BP 153/76 | HR 71 | Ht 62.0 in | Wt 122.7 lb

## 2015-10-26 DIAGNOSIS — R351 Nocturia: Secondary | ICD-10-CM | POA: Diagnosis not present

## 2015-10-26 DIAGNOSIS — N952 Postmenopausal atrophic vaginitis: Secondary | ICD-10-CM

## 2015-10-26 DIAGNOSIS — N390 Urinary tract infection, site not specified: Secondary | ICD-10-CM

## 2015-10-26 LAB — URINALYSIS, COMPLETE
BILIRUBIN UA: NEGATIVE
GLUCOSE, UA: NEGATIVE
KETONES UA: NEGATIVE
Nitrite, UA: POSITIVE — AB
PROTEIN UA: NEGATIVE
UUROB: 0.2 mg/dL (ref 0.2–1.0)
pH, UA: 5.5 (ref 5.0–7.5)

## 2015-10-26 LAB — MICROSCOPIC EXAMINATION: RBC, UA: NONE SEEN /hpf (ref 0–?)

## 2015-10-26 MED ORDER — METHENAMINE HIPPURATE 1 G PO TABS: 1.0000 g | ORAL_TABLET | Freq: Two times a day (BID) | ORAL | Status: DC

## 2015-10-26 NOTE — Progress Notes (Signed)
4:50 PM   Brenda Roman 01-19-1924 [id number]  Referring provider: Kirk Ruths, MD [address] [place], [place] [id number]  Chief Complaint  Patient presents with  . Follow-up  . Recurrent UTI    HPI: Patient is an 82 year old WF who presents today for a follow-up visit for recurrent urinary tract infections.  Patient has a history of CHF, s/p liver transplant, TIA, macular degeneration and blindness currently under hospice care.   She was recently hospitalized for an enterococcus UTI, chronic oral anticoagulation and paroxysmal Afib.  She presented to the emergency department complaining of an elevation in blood pressure, and increase in heart rate and chest pain.  Patient's husband states that she was catheterized for urine specimen and it grew out enterococcus.  She was seen after discharge by her primary care physician and a repeat urine culture was conducted. The husband states that this specimen was not catheterized.  The culture grew out Escherichia coli.  Today, the husband states that her urine is cloudy.  The patient states that she is not experiencing dysuria, gross hematuria or suprapubic pain.  She is not had any recent fevers, chills, nausea or vomiting.  She does state that she is having nocturia 3-4 nightly. She states that she has very and frequent urination during the day and her stream is weak.  Her cath UA today was nitrite positive with > 30 WBC's/hpf.    She has been using her vaginal estrogen cream 3 nights weekly. Though, she has not used it over the last week.   PMH: Past Medical History  Diagnosis Date  . Liver transplanted ([place])   . Macular degeneration   . Coronary artery disease   . Hypertension   . Shingles   . CHF (congestive heart failure) ([place])   . TIA (transient ischemic attack)   . 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 Aortic stenosis   . Mitral valve disease   . PBC (primary biliary cirrhosis) ([place])     s/p  liver transplant  . CVA (cerebral infarction) June '16  . Cancer [facility])     skin    Surgical History: Past Surgical History  Procedure Laterality Date  . Coronary artery bypass graft    . Joint replacement    . Bowel resection    . Colonoscopy    . Liver transplant  1990    Home Medications:    Medication List       This list is accurate as of: 10/26/15  4:50 PM.  Always use your most recent med list.               acetaminophen 325 MG tablet  Commonly known as:  TYLENOL  Take 2 tablets by mouth every 6 (six) hours as needed. Reported on 10/26/2015     aspirin EC 81 MG tablet  Take 81 mg by mouth daily.     conjugated estrogens vaginal cream  Commonly known as:  PREMARIN  Place 0.5 g vaginally 3 (three) times a week.     Cranberry 250 MG Tabs  Take 250 mg by mouth 2 (two) times daily.     ezetimibe-simvastatin 10-20 MG tablet  Commonly known as:  VYTORIN  Take 1 tablet by mouth 3 (three) times a week. Patient takes on Monday, Wednesday, Friday     gabapentin 100 MG capsule  Commonly known as:  NEURONTIN  Take 100 mg by mouth 2 (two) times daily.  methenamine 1 g tablet  Commonly known as:  HIPREX  Take 1 tablet (1 g total) by mouth 2 (two) times daily with a meal.     metoprolol tartrate 25 MG tablet  Commonly known as:  LOPRESSOR  Take 1 tablet (25 mg total) by mouth 2 (two) times daily.     mirtazapine 7.5 MG tablet  Commonly known as:  REMERON  Take 7.5 mg by mouth at bedtime.     mycophenolate 500 MG tablet  Commonly known as:  CELLCEPT  Take 500 mg by mouth every 12 (twelve) hours.     nitroGLYCERIN 0.4 MG SL tablet  Commonly known as:  NITROSTAT  Place 1 tablet under the tongue as needed. Reported on 10/26/2015     predniSONE 5 MG tablet  Commonly known as:  DELTASONE  Take 5 mg by mouth daily.     rivaroxaban 20 MG Tabs tablet  Commonly known as:  XARELTO  Take 20 mg by mouth daily with supper.     senna-docusate 8.6-50 MG tablet   Commonly known as:  Senokot-S  Take 3 tablets by mouth at bedtime.     traZODone 100 MG tablet  Commonly known as:  DESYREL  Take 150 mg by mouth at bedtime.     valACYclovir 1000 MG tablet  Commonly known as:  VALTREX  Take 1,000 mg by mouth every evening. Reported on 10/26/2015     zaleplon 5 MG capsule  Commonly known as:  SONATA  Take 1 capsule by mouth at bedtime.        Allergies:  Allergies  Allergen Reactions  . Cephalexin Other (See Comments)    Reaction:  Unknown  . Novocain [Procaine] Rash  . Sulfa Antibiotics Rash    Family History: Family History  Problem Relation Age of Onset  . Hypertension Father     Social History:  reports that she has quit smoking. She does not have any smokeless tobacco history on file. She reports that she does not drink alcohol or use illicit drugs.  ROS: UROLOGY Frequent Urination?: No Hard to postpone urination?: No Burning/pain with urination?: No Get up at night to urinate?: Yes Leakage of urine?: No Urine stream starts and stops?: No Trouble starting stream?: No Do you have to strain to urinate?: No Blood in urine?: No Urinary tract infection?: No Sexually transmitted disease?: No Injury to kidneys or bladder?: No Painful intercourse?: No Weak stream?: Yes Currently pregnant?: No Vaginal bleeding?: No Last menstrual period?: n  Gastrointestinal Nausea?: No Vomiting?: No Indigestion/heartburn?: No Diarrhea?: No Constipation?: No  Constitutional Fever: No Night sweats?: No Weight loss?: No Fatigue?: No  Skin Skin rash/lesions?: No Itching?: No  Eyes Blurred vision?: No Double vision?: No  Ears/Nose/Throat Sore throat?: No Sinus problems?: No  Hematologic/Lymphatic Swollen glands?: No Easy bruising?: No  Cardiovascular Leg swelling?: No Chest pain?: Yes  Respiratory Cough?: No Shortness of breath?: No  Endocrine Excessive thirst?: No  Musculoskeletal Back pain?: No Joint pain?:  No  Neurological Headaches?: No Dizziness?: No  Psychologic Depression?: No Anxiety?: No  Physical Exam: BP 153/76 mmHg  Pulse 71  Ht 5\' 2"  (1.575 m)  Wt 122 lb 11.2 oz (55.656 kg)  BMI 22.44 kg/m2  Constitutional: Well nourished. Alert and oriented, No acute distress. HEENT: Superior AT, moist mucus membranes. Trachea midline, no masses. Cardiovascular: No clubbing, cyanosis, or edema. Respiratory: Normal respiratory effort, no increased work of breathing. GI: Abdomen is soft, non tender, non distended, no abdominal masses. Liver  and spleen not palpable.  No hernias appreciated.  Stool sample for occult testing is not indicated.   GU: No CVA tenderness.  No bladder fullness or masses.  Atrophic external genitalia, normal pubic hair distribution, no lesions.  Normal urethral meatus, no lesions, no prolapse, no discharge.   Urethral caruncle is noted.  No bladder fullness, tenderness or masses. Normal vagina mucosa, good estrogen effect, no discharge, no lesions, good pelvic support, no cystocele or rectocele noted.  No cervical motion tenderness.  Uterus is freely mobile and non-fixed.  No adnexal/parametria masses or tenderness noted.  Anus and perineum are without rashes or lesions.    Skin: No rashes, bruises or suspicious lesions. Lymph: No cervical or inguinal adenopathy. Neurologic: Grossly intact, no focal deficits, moving all 4 extremities. Psychiatric: Normal mood and affect.  Laboratory Data:  Urinalysis: Results for orders placed or performed in visit on 10/26/15  Microscopic Examination  Result Value Ref Range   WBC, UA >30 (A) 0 -  5 /hpf   RBC, UA None seen 0 -  2 /hpf   Epithelial Cells (non renal) 0-10 0 - 10 /hpf   Bacteria, UA Few None seen/Few  Urinalysis, Complete  Result Value Ref Range   Specific Gravity, UA <1.005 (L) 1.005 - 1.030   pH, UA 5.5 5.0 - 7.5   Color, UA Yellow Yellow   Appearance Ur Cloudy (A) Clear   Leukocytes, UA 3+ (A) Negative    Protein, UA Negative Negative/Trace   Glucose, UA Negative Negative   Ketones, UA Negative Negative   RBC, UA 1+ (A) Negative   Bilirubin, UA Negative Negative   Urobilinogen, Ur 0.2 0.2 - 1.0 mg/dL   Nitrite, UA Positive (A) Negative   Microscopic Examination See below:     In and Out Catheterization  Patient is present today for a I & O catheterization due to recurrent UTI. Patient was cleaned and prepped in a sterile fashion with betadine and Lidocaine 2% jelly was instilled into the urethra.  A 14 FR cath was inserted no complications were noted , 100 ml of urine return was noted, urine was yellow cloudy in color. A clean urine sample was collected for culture. Bladder was drained  And catheter was removed with out difficulty.    Preformed by: Zara Council PA-C   Pertinent Imaging: CLINICAL DATA: 82 year old female with a history of left lower quadrant pain starting last evening. Worsening. Surgical history of liver transplant, colon resection, appendectomy  EXAM: CT ABDOMEN AND PELVIS WITH CONTRAST  TECHNIQUE: Multidetector CT imaging of the abdomen and pelvis was performed using the standard protocol following bolus administration of intravenous contrast.  CONTRAST: 135mL ISOVUE-300 IOPAMIDOL (ISOVUE-300) INJECTION 61%  COMPARISON: CT 12/26/2014  FINDINGS: Lower chest:  Unremarkable appearance of the soft tissues of the chest wall.  Cardiomegaly again noted. Calcifications of aortic valve. Calcifications of mitral annulus.  Calcifications in the distribution the right coronary artery.  Moderate-sized hiatal hernia.  No confluent airspace disease or pleural effusion. Linear opacities bilateral lung bases, compatible with scarring/atelectasis.  Abdomen/pelvis:  Unremarkable appearance of liver, with re- demonstration of gas in the biliary system. Surgical clips at the liver hilum, compatible with history of cholecystectomy and orthotopic  liver transplant.  Unremarkable spleen.  Unremarkable bilateral adrenal glands.  Unremarkable pancreas.  Bilateral kidneys demonstrate no evidence of nephrolithiasis or hydronephrosis. Unremarkable course of the bilateral ureters.  Bilateral rounded hypodense lesions of the kidneys, majority of which are too small to characterize by CT.  Cyst of the superior left kidney with fluid density measures 2.8 cm, compatible with Bosniak 1 cyst.  No abnormally distended colon or small bowel. Extensive colonic diverticula without associated inflammatory changes. No free fluid or free intraperitoneal air.  Nonspecific pelvic floor laxity.  Unremarkable appearance of the urinary bladder.  Fibroid changes of uterus, with unremarkable appearance of the adnexa.  Compression fracture of T12, L2, L3, L5. With 50% height loss at each level. No fracture line identified at any of these levels. Configuration unchanged when compared to prior CT of 2016.  Vascular calcifications of the abdominal aorta and mesenteric vessels. Mesenteric vessels are patent at the origin, including bilateral renal arteries.  Surgical changes of prior right hip fixation. No displaced fracture.  Osteopenia.  IMPRESSION: No acute finding identified to account for the patient's left-sided abdominal pain.  Colonic diverticular disease, with no evidence on the current CT of acute diverticulitis.  Atherosclerosis.  Re- demonstration of pneumobilia in this patient with a history of orthotopic liver transplant.  Hiatal hernia.  Aortic valve calcifications and mitral annular calcifications, incompletely imaged.  Signed,  Dulcy Fanny. Earleen Newport, DO  [facility]  [facility]   Electronically Signed  By: Corrie Mckusick D.O.  On: 09/08/2015 12:26   Assessment & Plan:    1. Recurrent UTI:   Patient is on water restrictions due to her  history of hyponatremia.  I have advised her to take probiotics (yogurt, oral pills or vaginal suppositories) and to take Vitamin C 1,000 mg daily to acidify the urine.  She is taking cranberry tablets, drinking juice and using her vaginal estrogen cream.  She should also avoid soaking in tubs and wipe front to back after urinating.  I have prescribed her Hiprex to take twice daily in an effort to stave off infections.                                        2. Vaginal Atrophy:    Patient reports significant improvement in vaginal burning and dysuria. We will continue vaginal estrogen cream three nights weekly.    3. Nocturia:   I explained to the patient that nocturia is often multi-factorial and difficult to treat.  Sleeping disorders, heart conditions and peripheral vascular disease, diabetes and/or urethral stricture causing bladder outlet obstruction and/or certain medications.  I have suggested that the patient avoid caffeine and alcohol in the evening.  Her husband has not witnessed apneic events or her snoring.      Return for pending urine culture results.  These notes generated with voice recognition software. I apologize for typographical errors.  Zara Council, [facility] [address] [place], [place] [address] [phone]

## 2015-10-26 NOTE — Patient Instructions (Addendum)
Methenamine Hippurate tablets What is this medicine? METHENAMINE (meth EN a meen) is used to prevent urinary tract infections due to bacteria. It is not used to treat an active infection. It will not work for colds, flu, or other viral infections. This medicine may be used for other purposes; ask your health care provider or pharmacist if you have questions. What should I tell my health care provider before I take this medicine? They need to know if you have any of these conditions: -dehydrated -kidney disease -liver disease -an unusual or allergic reaction to methenamine, tartrazine dye, other medicines, foods, dyes, or preservatives -pregnant or trying to get pregnant -breast-feeding How should I use this medicine? Take this medicine by mouth with a full glass of water. Follow the directions on the prescription label. Do not crush or chew. Take your medicine at regular intervals. Do not take your medicine more often than directed. Take all of your medicine as directed even if you think you are better. Do not skip doses or stop your medicine early. Talk to your pediatrician regarding the use of this medicine in children. While this drug may be prescribed for children as young as 80 years old for selected conditions, precautions do apply. Overdosage: If you think you have taken too much of this medicine contact a poison control center or emergency room at once. NOTE: This medicine is only for you. Do not share this medicine with others. What if I miss a dose? If you miss a dose, take it as soon as you can. If it is almost time for your next dose, take only that dose. Do not take double or extra doses. What may interact with this medicine? Do not take this medicine with any of the following medications: -sulfa drugs This medicine may also interact with the following medications: -acetazolamide -antacids -methazolamide -sodium bicarbonate This list may not describe all possible interactions.  Give your health care provider a list of all the medicines, herbs, non-prescription drugs, or dietary supplements you use. Also tell them if you smoke, drink alcohol, or use illegal drugs. Some items may interact with your medicine. What should I watch for while using this medicine? Tell your doctor or health care professional if your symptoms do not improve or if you get new symptoms. You will need to be on a special diet while taking this medicine. Ask your health care professional how many glasses of water or other fluids to drink each day. Also, ask which foods to include and which to avoid to help keep your urine acidic. Your urine must be acidic for this medicine to work. What side effects may I notice from receiving this medicine? Side effects that you should report to your doctor or health care professional as soon as possible: -allergic reactions like skin rash, itching or hives, swelling of the face, lips, or tongue -bladder pain, irritation -dark urine -lower back pain -painful, frequent urination Side effects that usually do not require medical attention (report to your doctor or health care professional if they continue or are bothersome): -diarrhea -loss of appetite -mouth sores -nausea -stomach upset This list may not describe all possible side effects. Call your doctor for medical advice about side effects. You may report side effects to FDA at 1-800-FDA-1088. Where should I keep my medicine? Keep out of the reach of children. Store between 15 and 30 degrees C (59 and 86 degrees F). Keep container tightly closed. Throw away any unused medicine after the expiration date. NOTE:  This sheet is a summary. It may not cover all possible information. If you have questions about this medicine, talk to your doctor, pharmacist, or health care provider.    2016, Elsevier/Gold Standard. (2013-12-03 12:52:35)                                              Urinary Tract Infection  Prevention Patient Education Stay Hydrated: Urinary tract infections (UTIs) are less likely to occur in someone who is drinking enough water to promote regular urination, so it is very important to stay hydrated in order to help flush out bacteria from the urinary tract. Respond to "Nature's Call": It is always a good idea to urinate as soon as you feel the need. While "holding it in" does not directly cause an infection, it can cause overdistension that can damage the lining of the bladder, making it more vulnerable to bacteria. Remove Tampons Before Going: Remember to always take out tampons before urinating, and change tampons often.  Practice Proper Bathroom Hygiene: To keep bacteria near the urethral opening to a minimum, it is important to practice proper wiping techniques (i.e. front to back wiping) to help prevent rectal bacteria from entering the uretro-genital area. It can also be helpful to take showers and avoid soaking in the bathtub.  Take a Vitamin C Supplement: About 1,000 milligrams of vitamin C taken daily can help inhibit the growth of some bacteria by acidifying the urine. Maintain Control with Cranberries: Cranberries contain hippuronic acid, which is a natural antiseptic that may help prevent the adherence of bacteria to the bladder lining. Drinking 100% pure cranberry juice or taking over the counter cranberry supplements twice daily may help to prevent an infection. However, it is important to note that cranberry juices/supplements are not helpful once a urinary tract infection (UTI) is present. Strengthen Your Core: Often, a lazy bladder (unable to empty urine properly) occurs due to lower back problem, so consider doing exercises to help strengthen your back, pelvic floor, and stomach muscles.  Pay Attention to Your Urine: Your urine can change color for a variety of reasons, including from the medications you take, so pay close attention to it to monitor your overall health. One  key thing to note is that if your urine is typically a darker yellow, your body is dehydrated, so you need to step up your water intake.

## 2015-10-28 ENCOUNTER — Other Ambulatory Visit: Payer: Self-pay | Admitting: Urology

## 2015-10-28 LAB — CULTURE, URINE COMPREHENSIVE

## 2015-10-28 MED ORDER — CIPROFLOXACIN HCL 250 MG PO TABS: 250.0000 mg | ORAL_TABLET | Freq: Two times a day (BID) | ORAL | Status: DC

## 2015-10-31 ENCOUNTER — Telehealth: Payer: Self-pay

## 2015-10-31 NOTE — Telephone Encounter (Signed)
-----   Message from Nori Riis, PA-C sent at 10/28/2015  8:40 PM EDT ----- Please tell the patient that she has a positive urine culture. I have sent Cipro to [facility]. I would like to see her in 2 weeks.

## 2015-10-31 NOTE — Telephone Encounter (Signed)
Spoke with pt and husband and made aware of medication.

## 2015-11-11 ENCOUNTER — Other Ambulatory Visit: Payer: Self-pay

## 2015-11-15 ENCOUNTER — Telehealth: Payer: Self-pay

## 2015-11-15 ENCOUNTER — Ambulatory Visit: Payer: Medicare Other | Admitting: Urology

## 2015-11-15 DIAGNOSIS — N390 Urinary tract infection, site not specified: Secondary | ICD-10-CM

## 2015-11-15 NOTE — Telephone Encounter (Signed)
Pt husband called stating pt feels as though she has another UTI. Husband stated that pt has increased frequency and cloudy urine. Husband denied n/v, f/c. Husband requested to bring in a urine for pt. Please advise.

## 2015-11-15 NOTE — Telephone Encounter (Signed)
Because of patient's history of recurrent urinary tract infection, it is medically necessary for the patient to present to the office for a catheterized specimen for urinalysis and culture.

## 2015-11-16 ENCOUNTER — Ambulatory Visit (INDEPENDENT_AMBULATORY_CARE_PROVIDER_SITE_OTHER): Payer: Medicare Other

## 2015-11-16 VITALS — BP 130/70 | HR 80 | Temp 96.5°F | Wt 122.0 lb

## 2015-11-16 DIAGNOSIS — N390 Urinary tract infection, site not specified: Secondary | ICD-10-CM

## 2015-11-16 LAB — HISTORICAL CYTOPATHOLOGY, NON GYN

## 2015-11-16 NOTE — Addendum Note (Signed)
Addended by: Jodie Echevaria on: 11/16/2015 03:57 PM   Modules accepted: Orders

## 2015-11-16 NOTE — Progress Notes (Signed)
In and Out Catheterization  Patient is present today for a I & O catheterization due to UTI like symptoms. Patient was cleaned and prepped in a sterile fashion with betadine and Lidocaine 2% jelly was instilled into the urethra.  A 14FR cath was inserted no complications were noted , 185ml of urine return was noted, urine was cloudy and yellow in color. A clean urine sample was collected for u/a and cx. Bladder was drained  And catheter was removed with out difficulty.    Preformed by: Toniann Fail, LPN   Follow up/ Additional notes: Pt VS- 130/70, 80, 96.5, 122lbs. Pt c/o increased frequency, cloudy urine, and dysuria.

## 2015-11-16 NOTE — Telephone Encounter (Signed)
Spoke with pt husband in reference to pt feeling as though she has a UTI. Made husband aware will need a cath specimen. Pt was added to nurse schedule today.

## 2015-11-17 LAB — MICROSCOPIC EXAMINATION
EPITHELIAL CELLS (NON RENAL): NONE SEEN /HPF (ref 0–10)
WBC, UA: >30 /hpf — AB (ref 0–?)

## 2015-11-17 LAB — URINALYSIS, COMPLETE
BILIRUBIN UA: NEGATIVE
Glucose, UA: NEGATIVE
KETONES UA: NEGATIVE
NITRITE UA: POSITIVE — AB
PH UA: 5.5 (ref 5.0–7.5)
SPEC GRAV UA: 1.01 (ref 1.005–1.030)
Urobilinogen, Ur: 0.2 mg/dL (ref 0.2–1.0)

## 2015-11-18 LAB — CULTURE, URINE COMPREHENSIVE

## 2015-11-21 ENCOUNTER — Telehealth: Payer: Self-pay | Admitting: *Deleted

## 2015-11-21 ENCOUNTER — Other Ambulatory Visit: Payer: Self-pay | Admitting: Urology

## 2015-11-21 ENCOUNTER — Ambulatory Visit: Payer: Medicare Other | Admitting: Urology

## 2015-11-21 MED ORDER — NITROFURANTOIN MONOHYD MACRO 100 MG PO CAPS: 100.0000 mg | ORAL_CAPSULE | Freq: Two times a day (BID) | ORAL | 0 refills | Status: DC

## 2015-11-21 NOTE — Telephone Encounter (Signed)
Spoke with patient at office she came to a scheduled appointment today that she did not need and I informed her of the positive cx and talked about Macrobid and dosage. Med sent to pharmacy. Patient ok with the plan.

## 2015-11-21 NOTE — Telephone Encounter (Signed)
-----   Message from Nori Riis, PA-C sent at 11/20/2015  5:21 PM EDT ----- Please notify the patient that she has a positive urine culture.  She needs to start nitrofurantoin 100 mg, twice daily for seven days.

## 2016-06-02 ENCOUNTER — Emergency Department
Admission: EM | Admit: 2016-06-02 | Discharge: 2016-06-02 | Disposition: A | Discharge status: Home / Self Care | Payer: Medicare Other | Attending: Emergency Medicine | Admitting: Emergency Medicine

## 2016-06-02 ENCOUNTER — Encounter: Payer: Self-pay | Admitting: Emergency Medicine

## 2016-06-02 DIAGNOSIS — I2510 Atherosclerotic heart disease of native coronary artery without angina pectoris: Secondary | ICD-10-CM | POA: Diagnosis not present

## 2016-06-02 DIAGNOSIS — Z85828 Personal history of other malignant neoplasm of skin: Secondary | ICD-10-CM | POA: Insufficient documentation

## 2016-06-02 DIAGNOSIS — I509 Heart failure, unspecified: Secondary | ICD-10-CM | POA: Diagnosis not present

## 2016-06-02 DIAGNOSIS — Z87891 Personal history of nicotine dependence: Secondary | ICD-10-CM | POA: Diagnosis not present

## 2016-06-02 DIAGNOSIS — K068 Other specified disorders of gingiva and edentulous alveolar ridge: Secondary | ICD-10-CM | POA: Diagnosis present

## 2016-06-02 DIAGNOSIS — I110 Hypertensive heart disease with heart failure: Secondary | ICD-10-CM | POA: Insufficient documentation

## 2016-06-02 MED ORDER — TRANEXAMIC ACID 1000 MG/10ML IV SOLN
500.0000 mg | Freq: Once | INTRAVENOUS | Status: DC
  Filled 2016-06-02: qty 10

## 2016-06-02 NOTE — ED Notes (Signed)
Pt c/o dressing is soaked through, new surgicell placed along with gauze

## 2016-06-02 NOTE — ED Triage Notes (Signed)
Pt to triage via wc reports was brushing her teeth, cut upper right gum and won't stop bleeding.  Pt on xarelto.  Surgicell placed in pt's mouth.  Pt NAD at this time.

## 2016-06-02 NOTE — ED Notes (Signed)
Replaced surgicel dressing per MD request

## 2016-06-02 NOTE — ED Notes (Signed)
Patient c/o upper right gum bleeding beginning at 2100 on 2/9 after brushing her teeth. Pt denies pain.

## 2016-06-02 NOTE — ED Notes (Signed)
Reviewed d/c instructions, follow-up care, oral care with patient. Pt verbalized understanding

## 2016-06-02 NOTE — ED Provider Notes (Signed)
[facility] Emergency Department Provider Note  ____________________________________________  Time seen: Approximately 2:33 AM  I have reviewed the triage vital signs and the nursing notes.   HISTORY  Chief Complaint Mouth Injury   HPI Brenda Roman is a 83 y.o. female who presents for evaluation of bleeding gum. Patient reports that she was brushing her teeth when she sustained a small cut to her R upper gum area. She is on Xarelto and has not been able to stop the bleeding. She denies brisk bleeding and reports mild slow continuous bleeding for a few hours. NO pain, CP, SOB, dizziness.   Past Medical History:  Diagnosis Date  . Aortic stenosis   . 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 Cancer ([place])    skin  . CHF (congestive heart failure) ([place])   . Coronary artery disease   . CVA (cerebral infarction) June '16  . Hypertension   . Liver transplanted ([place])   . Macular degeneration   . Mitral valve disease   . PBC (primary biliary cirrhosis)    s/p liver transplant  . Shingles   . TIA (transient ischemic attack)     Patient Active Problem List   Diagnosis Date Noted  . Arrhythmia, ventricular 09/19/2015  . Hyponatremia 07/30/2015  . Acute CHF (congestive heart failure) ([place]) 07/18/2015  . 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 Closed wedge fracture of lumbar vertebra ([place]) 10/11/2014  . Ischemic stroke ([place]) 10/07/2014  . Acute urinary tract infection 10/07/2014  . 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 Closed fracture of humerus, surgical neck 09/29/2014  . Goals of care, counseling/discussion   . Acute encephalopathy 08/29/2014  . Hypoxia 08/29/2014  . Healthcare-associated pneumonia 08/29/2014  . Pneumonia 08/29/2014  . CVA (cerebral infarction) 08/23/2014  . Arteriosclerosis of coronary artery 03/15/2014  . Paroxysmal atrial fibrillation ([place]) 03/15/2014  . Atherosclerosis of abdominal aorta ([place]) 01/27/2014  . HLD (hyperlipidemia)  04/07/2012  . BP (high blood pressure) 04/07/2012  . History of liver transplant ([place]) 04/03/2012  . Encounter for other procedures for purposes other than remedying health state 04/03/2012  . Prolapse of urethra 03/10/2012  . Pelvic relaxation due to uterovaginal prolapse 03/10/2012    Past Surgical History:  Procedure Laterality Date  . BOWEL RESECTION    . COLONOSCOPY    . CORONARY ARTERY BYPASS GRAFT    . JOINT REPLACEMENT    . LIVER TRANSPLANT  1990    Prior to Admission medications   Medication Sig Start Date End Date Taking? Authorizing Provider  acetaminophen (TYLENOL) 325 MG tablet Take 2 tablets by mouth every 6 (six) hours as needed. Reported on 10/26/2015    Historical Provider, MD  aspirin EC 81 MG tablet Take 81 mg by mouth daily.    Historical Provider, MD  ciprofloxacin (CIPRO) 250 MG tablet Take 1 tablet (250 mg total) by mouth 2 (two) times daily. 10/28/15   Nori Riis, PA-C  conjugated estrogens (PREMARIN) vaginal cream Place 0.5 g vaginally 3 (three) times a week.    Historical Provider, MD  Cranberry 250 MG TABS Take 250 mg by mouth 2 (two) times daily.    Historical Provider, MD  ezetimibe-simvastatin (VYTORIN) 10-20 MG tablet Take 1 tablet by mouth 3 (three) times a week. Patient takes on Monday, Wednesday, Friday    Historical Provider, MD  gabapentin (NEURONTIN) 100 MG capsule Take 100 mg by mouth 2 (two) times daily.     Historical Provider, MD  methenamine (  HIPREX) 1 g tablet Take 1 tablet (1 g total) by mouth 2 (two) times daily with a meal. 10/26/15   Larene Beach A McGowan, PA-C  metoprolol tartrate (LOPRESSOR) 25 MG tablet Take 1 tablet (25 mg total) by mouth 2 (two) times daily. 09/21/15   Fritzi Mandes, MD  mirtazapine (REMERON) 7.5 MG tablet Take 7.5 mg by mouth at bedtime.    Historical Provider, MD  mycophenolate (CELLCEPT) 500 MG tablet Take 500 mg by mouth every 12 (twelve) hours.    Historical Provider, MD  nitrofurantoin, macrocrystal-monohydrate,  (MACROBID) 100 MG capsule Take 1 capsule (100 mg total) by mouth every 12 (twelve) hours. 11/21/15   Nori Riis, PA-C  nitroGLYCERIN (NITROSTAT) 0.4 MG SL tablet Place 1 tablet under the tongue as needed. Reported on 10/26/2015 06/20/15   Historical Provider, MD  predniSONE (DELTASONE) 5 MG tablet Take 5 mg by mouth daily.    Historical Provider, MD  rivaroxaban (XARELTO) 20 MG TABS tablet Take 20 mg by mouth daily with supper.    Historical Provider, MD  senna-docusate (SENOKOT-S) 8.6-50 MG per tablet Take 3 tablets by mouth at bedtime.     Historical Provider, MD  traZODone (DESYREL) 100 MG tablet Take 150 mg by mouth at bedtime.    Historical Provider, MD  valACYclovir (VALTREX) 1000 MG tablet Take 1,000 mg by mouth every evening. Reported on 10/26/2015    Historical Provider, MD  zaleplon (SONATA) 5 MG capsule Take 1 capsule by mouth at bedtime. 09/13/15 10/13/15  Historical Provider, MD    Allergies Cephalexin; Novocain [procaine]; and Sulfa antibiotics  Family History  Problem Relation Age of Onset  . Hypertension Father     Social History Social History  Substance Use Topics  . Smoking status: Former Research scientist (life sciences)  . Smokeless tobacco: Never Used  . Alcohol use No    Review of Systems  Constitutional: Negative for fever. Eyes: Negative for visual changes. ENT: Negative for sore throat. + bleeding gum Neck: No neck pain  Cardiovascular: Negative for chest pain. Respiratory: Negative for shortness of breath. Gastrointestinal: Negative for abdominal pain, vomiting or diarrhea. Genitourinary: Negative for dysuria. Musculoskeletal: Negative for back pain. Skin: Negative for rash. Neurological: Negative for headaches, weakness or numbness. Psych: No SI or HI  ____________________________________________   PHYSICAL EXAM:  VITAL SIGNS: ED Triage Vitals  Enc Vitals Group     BP 06/02/16 0045 (!) 180/96     Pulse Rate 06/02/16 0045 (!) 59     Resp 06/02/16 0045 18     Temp  06/02/16 0045 98.1 F (36.7 C)     Temp Source 06/02/16 0045 Oral     SpO2 06/02/16 0045 97 %     Weight 06/02/16 0041 113 lb (51.3 kg)     Height 06/02/16 0041 5\' 2"  (1.575 m)     Head Circumference --      Peak Flow --      Pain Score 06/02/16 0042 0     Pain Loc --      Pain Edu? --      Excl. in [place]? --     Constitutional: Alert and oriented. Well appearing and in no apparent distress. HEENT:      Head: Normocephalic and atraumatic.         Eyes: Conjunctivae are normal. Sclera is non-icteric. EOMI. PERRL      Mouth/Throat: Mucous membranes are moist. Right upper gum area actively oozing gum.      Neck: Supple with no signs  of meningismus. Cardiovascular: Regular rate and rhythm. No murmurs, gallops, or rubs. 2+ symmetrical distal pulses are present in all extremities. No JVD. Respiratory: Normal respiratory effort. Lungs are clear to auscultation bilaterally. No wheezes, crackles, or rhonchi.  Neurologic: Normal speech and language. Face is symmetric. Moving all extremities. No gross focal neurologic deficits are appreciated. Skin: Skin is warm, dry and intact. No rash noted. Psychiatric: Mood and affect are normal. Speech and behavior are normal.  ____________________________________________   LABS (all labs ordered are listed, but only abnormal results are displayed)  Labs Reviewed - No data to display ____________________________________________  EKG  none  ____________________________________________  RADIOLOGY  none  ____________________________________________   PROCEDURES  Procedure(s) performed: None Procedures Critical Care performed:  None ____________________________________________   INITIAL IMPRESSION / ASSESSMENT AND PLAN / ED COURSE   83 y.o. Female on Xarelto who presents for evaluation of bleeding gum. Surgicel applied, patient given suction. Pressure will be applied for 10 min and reassess.   Patient stopped bleeding after pressure was  applied for 10 minute and Surgicel. She was observed for an hour with no recurrence of her bleeding. She remained hemodynamically stable. We'll discharge him at this time.     Pertinent labs & imaging results that were available during my care of the patient were reviewed by me and considered in my medical decision making (see chart for details).    ____________________________________________   FINAL CLINICAL IMPRESSION(S) / ED DIAGNOSES  Final diagnoses:  Bleeding gums      NEW MEDICATIONS STARTED DURING THIS VISIT:  New Prescriptions   No medications on file     Note:  This document was prepared using Dragon voice recognition software and may include unintentional dictation errors.    Rudene Re, MD 06/02/16 [phone]

## 2016-08-30 ENCOUNTER — Emergency Department
Admission: EM | Admit: 2016-08-30 | Discharge: 2016-08-30 | Disposition: A | Discharge status: Home / Self Care | Payer: Medicare Other | Attending: Emergency Medicine | Admitting: Emergency Medicine

## 2016-08-30 ENCOUNTER — Emergency Department: Payer: Medicare Other

## 2016-08-30 ENCOUNTER — Encounter: Payer: Self-pay | Admitting: *Deleted

## 2016-08-30 DIAGNOSIS — I2510 Atherosclerotic heart disease of native coronary artery without angina pectoris: Secondary | ICD-10-CM | POA: Diagnosis not present

## 2016-08-30 DIAGNOSIS — Z8673 Personal history of transient ischemic attack (TIA), and cerebral infarction without residual deficits: Secondary | ICD-10-CM | POA: Insufficient documentation

## 2016-08-30 DIAGNOSIS — Z951 Presence of aortocoronary bypass graft: Secondary | ICD-10-CM | POA: Insufficient documentation

## 2016-08-30 DIAGNOSIS — I110 Hypertensive heart disease with heart failure: Secondary | ICD-10-CM | POA: Diagnosis not present

## 2016-08-30 DIAGNOSIS — N3091 Cystitis, unspecified with hematuria: Secondary | ICD-10-CM | POA: Diagnosis not present

## 2016-08-30 DIAGNOSIS — R319 Hematuria, unspecified: Secondary | ICD-10-CM | POA: Diagnosis present

## 2016-08-30 DIAGNOSIS — Z87891 Personal history of nicotine dependence: Secondary | ICD-10-CM | POA: Diagnosis not present

## 2016-08-30 DIAGNOSIS — Z7982 Long term (current) use of aspirin: Secondary | ICD-10-CM | POA: Insufficient documentation

## 2016-08-30 DIAGNOSIS — I509 Heart failure, unspecified: Secondary | ICD-10-CM | POA: Diagnosis not present

## 2016-08-30 DIAGNOSIS — N3090 Cystitis, unspecified without hematuria: Secondary | ICD-10-CM

## 2016-08-30 DIAGNOSIS — Z85828 Personal history of other malignant neoplasm of skin: Secondary | ICD-10-CM | POA: Diagnosis not present

## 2016-08-30 DIAGNOSIS — Z79899 Other long term (current) drug therapy: Secondary | ICD-10-CM | POA: Insufficient documentation

## 2016-08-30 DIAGNOSIS — R310 Gross hematuria: Secondary | ICD-10-CM

## 2016-08-30 LAB — URINALYSIS, COMPLETE (UACMP) WITH MICROSCOPIC
BACTERIA UA: NONE SEEN
Bilirubin Urine: NEGATIVE
Glucose, UA: NEGATIVE mg/dL
Ketones, ur: NEGATIVE mg/dL
Nitrite: NEGATIVE
Protein, ur: NEGATIVE mg/dL
SPECIFIC GRAVITY, URINE: 1.003 — AB (ref 1.005–1.030)
pH: 7 (ref 5.0–8.0)

## 2016-08-30 LAB — BASIC METABOLIC PANEL
Anion gap: 8 (ref 5–15)
BUN: 7 mg/dL (ref 6–20)
CHLORIDE: 93 mmol/L — AB (ref 101–111)
CO2: 28 mmol/L (ref 22–32)
Calcium: 9.2 mg/dL (ref 8.9–10.3)
Creatinine, Ser: 0.71 mg/dL (ref 0.44–1.00)
GFR calc Af Amer: >60 mL/min (ref >60)
GFR calc non Af Amer: >60 mL/min (ref >60)
Glucose, Bld: 91 mg/dL (ref 65–99)
POTASSIUM: 4.1 mmol/L (ref 3.5–5.1)
Sodium: 129 mmol/L — ABNORMAL LOW (ref 135–145)

## 2016-08-30 LAB — CBC
HEMATOCRIT: 36.6 % (ref 35.0–47.0)
HEMOGLOBIN: 12.3 g/dL (ref 12.0–16.0)
MCH: 31.8 pg (ref 26.0–34.0)
MCHC: 33.7 g/dL (ref 32.0–36.0)
MCV: 94.4 fL (ref 80.0–100.0)
Platelets: 247 10*3/uL (ref 150–440)
RBC: 3.87 MIL/uL (ref 3.80–5.20)
RDW: 14.5 % (ref 11.5–14.5)
WBC: 3.6 10*3/uL (ref 3.6–11.0)

## 2016-08-30 MED ORDER — FOSFOMYCIN TROMETHAMINE 3 G PO PACK
3.0000 g | PACK | ORAL | Status: AC
  Administered 2016-08-30: 3 g via ORAL
  Filled 2016-08-30: qty 3

## 2016-08-30 MED ORDER — FOSFOMYCIN TROMETHAMINE 3 G PO PACK: 3.0000 g | PACK | Freq: Once | ORAL | 0 refills | Status: AC

## 2016-08-30 NOTE — ED Triage Notes (Signed)
Pt complains of blood in urine starting yesterday, husband reports a UA was taken to the lab yesterday, pt denies pain or any other symptoms

## 2016-08-30 NOTE — ED Notes (Signed)
Patient transported to Ultrasound 

## 2016-08-30 NOTE — ED Notes (Signed)
Pt brought into ER via wheelchair with husband. Pt alert and oriented X4, active, cooperative, pt in NAD. RR even and unlabored, color WNL.

## 2016-08-30 NOTE — ED Notes (Signed)
Pt given water.  Instructed to push call bell when needs to use bathroom.

## 2016-08-30 NOTE — ED Provider Notes (Signed)
[facility] Emergency Department Provider Note   ____________________________________________   First MD Initiated Contact with Patient 08/30/16 [phone]     (approximate)  I have reviewed the triage vital signs and the nursing notes.   HISTORY  Chief Complaint Hematuria    HPI Brenda Roman is a 83 y.o. female who reports she was unaware of any problems, denies any pain or symptoms except because she is blind her husband and aide told her that her urine has looked slightly pink and dark over the last day and a half.There is no been no blood or dark stools. She does takes Xarelto due to previous stroke and heart troubles, but has recently reduced her dose about 3 weeks ago at her doctors advise.   Patient denies any fevers or chills. No pain. No nausea or vomiting. She reports she is blind and very hard of hearing. Denies any history of blood in her urine in the past. Denies trouble emptying the bladder.  She submitted a urine sample to the clinic, but has not received her result.     Past Medical History:  Diagnosis Date  . Aortic stenosis   . 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 Cancer ([place])    skin  . CHF (congestive heart failure) ([place])   . Coronary artery disease   . CVA (cerebral infarction) June '16  . Hypertension   . Liver transplanted ([place])   . Macular degeneration   . Mitral valve disease   . PBC (primary biliary cirrhosis)    s/p liver transplant  . Shingles   . TIA (transient ischemic attack)     Patient Active Problem List   Diagnosis Date Noted  . Arrhythmia, ventricular 09/19/2015  . Hyponatremia 07/30/2015  . Acute CHF (congestive heart failure) ([place]) 07/18/2015  . 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 Closed wedge fracture of lumbar vertebra ([place]) 10/11/2014  . Ischemic stroke ([place]) 10/07/2014  . Acute urinary tract infection 10/07/2014  . 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 Closed fracture of humerus, surgical neck  09/29/2014  . Goals of care, counseling/discussion   . Acute encephalopathy 08/29/2014  . Hypoxia 08/29/2014  . Healthcare-associated pneumonia 08/29/2014  . Pneumonia 08/29/2014  . CVA (cerebral infarction) 08/23/2014  . Arteriosclerosis of coronary artery 03/15/2014  . Paroxysmal atrial fibrillation ([place]) 03/15/2014  . Atherosclerosis of abdominal aorta ([place]) 01/27/2014  . HLD (hyperlipidemia) 04/07/2012  . BP (high blood pressure) 04/07/2012  . History of liver transplant ([place]) 04/03/2012  . Encounter for other procedures for purposes other than remedying health state 04/03/2012  . Prolapse of urethra 03/10/2012  . Pelvic relaxation due to uterovaginal prolapse 03/10/2012    Past Surgical History:  Procedure Laterality Date  . BOWEL RESECTION    . COLONOSCOPY    . CORONARY ARTERY BYPASS GRAFT    . JOINT REPLACEMENT    . LIVER TRANSPLANT  1990    Prior to Admission medications   Medication Sig Start Date End Date Taking? Authorizing Provider  aspirin EC 81 MG tablet Take 81 mg by mouth daily.   Yes [provider]  ezetimibe-simvastatin (VYTORIN) 10-20 MG tablet Take 1 tablet by mouth 3 (three) times a week. Patient takes on Monday, Wednesday, Friday   Yes [provider]  gabapentin (NEURONTIN) 100 MG capsule Take 100 mg by mouth at bedtime.    Yes [provider]  metoprolol tartrate (LOPRESSOR) 25 MG tablet Take 1 tablet (25 mg total) by mouth  2 (two) times daily. 09/21/15  Yes Fritzi Mandes, MD  mycophenolate (CELLCEPT) 500 MG tablet Take 500 mg by mouth every 12 (twelve) hours.   Yes [provider]  predniSONE (DELTASONE) 5 MG tablet Take 5 mg by mouth daily.   Yes [provider]  Rivaroxaban (XARELTO) 15 MG TABS tablet Take 7.5 mg by mouth daily with supper.    Yes [provider]  senna-docusate (SENOKOT-S) 8.6-50 MG per tablet Take 3 tablets by mouth at bedtime.    Yes [provider]  valACYclovir (VALTREX)  1000 MG tablet Take 1,000 mg by mouth every evening. Reported on 10/26/2015   Yes [provider]  acetaminophen (TYLENOL) 325 MG tablet Take 2 tablets by mouth every 6 (six) hours as needed. Reported on 10/26/2015    [provider]  ciprofloxacin (CIPRO) 250 MG tablet Take 1 tablet (250 mg total) by mouth 2 (two) times daily. Patient not taking: Reported on 08/30/2016 10/28/15   Zara Council A, PA-C  fosfomycin (MONUROL) 3 g PACK Take 3 g by mouth once. Please mix in 8 oz of water, take by mouth once on 09/03/2016 08/30/16 08/30/16  Delman Kitten, MD  methenamine (HIPREX) 1 g tablet Take 1 tablet (1 g total) by mouth 2 (two) times daily with a meal. Patient not taking: Reported on 08/30/2016 10/26/15   Zara Council A, PA-C  nitrofurantoin, macrocrystal-monohydrate, (MACROBID) 100 MG capsule Take 1 capsule (100 mg total) by mouth every 12 (twelve) hours. Patient not taking: Reported on 08/30/2016 11/21/15   Zara Council A, PA-C  nitroGLYCERIN (NITROSTAT) 0.4 MG SL tablet Place 1 tablet under the tongue as needed. Reported on 10/26/2015 06/20/15   [provider]  zaleplon (SONATA) 5 MG capsule Take 1 capsule by mouth at bedtime. 09/13/15 10/13/15  [provider]    Allergies Cephalexin; Novocain [procaine]; and Sulfa antibiotics  Family History  Problem Relation Age of Onset  . Hypertension Father     Social History Social History  Substance Use Topics  . Smoking status: Former Research scientist (life sciences)  . Smokeless tobacco: Never Used  . Alcohol use No    Review of Systems Constitutional: No fever/chills Eyes: Reports she is blind Cardiovascular: Denies chest pain. Respiratory: Denies shortness of breath. Gastrointestinal: No abdominal pain.  No nausea, no vomiting.  No diarrhea.  No constipation. Genitourinary: Negative for dysuria.See history of present illness Musculoskeletal: Negative for back pain. Skin: Negative for rash. Neurological: Negative for headaches.  Reports chronic slight speech trouble due to a stroke.  10-point ROS otherwise negative.  ____________________________________________   PHYSICAL EXAM:  VITAL SIGNS: ED Triage Vitals [08/30/16 0852]  Enc Vitals Group     BP (!) 178/77     Pulse Rate 65     Resp 20     Temp 98.6 F (37 C)     Temp Source Oral     SpO2 98 %     Weight 114 lb (51.7 kg)     Height 5\' 2"  (1.575 m)     Head Circumference      Peak Flow      Pain Score      Pain Loc      Pain Edu?      Excl. in [place]?     Constitutional: Alert and oriented. Well appearing and in no acute distress.Patient did husband both very pleasant. Head: Atraumatic. Nose: No congestion/rhinnorhea. Mouth/Throat: Mucous membranes are moist.  Oropharynx non-erythematous. Neck: No stridor.   Cardiovascular: Normal rate,  regular rhythm. Grossly normal heart sounds.  Good peripheral circulation. Respiratory: Normal respiratory effort.  No retractions. Lungs CTAB. Gastrointestinal: Soft and nontender. No distention. Musculoskeletal: No lower extremity tenderness nor edema.   Neurologic:  Normal speech and language. No gross focal neurologic deficits are appreciated.  Skin:  Skin is warm, dry and intact. No rash noted. Psychiatric: Mood and affect are normal. Speech and behavior are normal.  ____________________________________________   LABS (all labs ordered are listed, but only abnormal results are displayed)  Labs Reviewed  URINALYSIS, COMPLETE (UACMP) WITH MICROSCOPIC - Abnormal; Notable for the following:       Result Value   Color, Urine STRAW (*)    APPearance HAZY (*)    Specific Gravity, Urine 1.003 (*)    Hgb urine dipstick SMALL (*)    Leukocytes, UA LARGE (*)    Squamous Epithelial / LPF 0-5 (*)    All other components within normal limits  BASIC METABOLIC PANEL - Abnormal; Notable for the following:    Sodium 129 (*)    Chloride 93 (*)    All other components within normal limits  URINE CULTURE  CBC    ____________________________________________  EKG   ____________________________________________  RADIOLOGY  US Renal  Result Date: 08/30/2016 CLINICAL DATA:  83 year old female with painless gross hematuria since yesterday. EXAM: RENAL / URINARY TRACT ULTRASOUND COMPLETE COMPARISON:  CT Abdomen and Pelvis 09/08/2015. Renal ultrasound 03/18/2015 FINDINGS: Right Kidney: Length: 10.0 cm. Cortical echogenicity at the upper limits of normal. Right pelvic fullness/extra renal pelvis is new compared to the 2016 ultrasound but appears stable from the 2017 CT Abdomen and Pelvis. No hydronephrosis (images 19 and 22). Multiple small benign cortical cysts re- demonstrated. No right renal mass. Left Kidney: Length: 8.6 cm. Echogenicity is at the upper limits of normal. No hydronephrosis or left renal mass. Chronic simple upper pole cyst re - demonstrated. Bladder: Appears normal for degree of bladder distention. Both ureteral jets are visible with Doppler. IMPRESSION: No acute renal or urinary bladder findings. Both kidneys appear stable since the 2017 CT Abdomen and Pelvis. Electronically Signed   By: Genevie Ann M.D.   On: 08/30/2016 10:59    ____________________________________________   PROCEDURES  Procedure(s) performed: None  Procedures  Critical Care performed: No  ____________________________________________   INITIAL IMPRESSION / ASSESSMENT AND PLAN / ED COURSE  Pertinent labs & imaging results that were available during my care of the patient were reviewed by me and considered in my medical decision making (see chart for details).  Asymptomatic hematuria reported. Review of urinalysis from clinic visit demonstrates bacteria, leukocytes, and is suspicious for possible infection. Review of patient's allergies, we'll treat her with fosfomycin for possible urinary tract infection. Patient demonstrates no signs or symptoms of sepsis. She denies any pain and has a very reassuring abdominal  exam. Did discuss with her and we will obtain a renal ultrasound to evaluate for possible tumor or mass which could also lead to hematuria.  ----------------------------------------- 11:34 AM on 08/30/2016 -----------------------------------------  Discussed with patient and husband, we'll plan to treat for possible hematuria related to cystitis from a possible UTI. They will call Dr. Tonette Bihari office today to discuss Xarelto and whether or not he wishes them to continue on this. Currently no evidence of severe bleeding. Stable hemoglobin. No evidence of obstruction or clot passage. Based on outpatient urinalysis performed a couple days ago with bacteria present, and also today with multiple white cells and large leukocytes we will treat for infection  and they will continue to monitor closely.  Return precautions and treatment recommendations and follow-up discussed with the patient who is agreeable with the plan.       ____________________________________________   FINAL CLINICAL IMPRESSION(S) / ED DIAGNOSES  Final diagnoses:  Gross hematuria  Cystitis      NEW MEDICATIONS STARTED DURING THIS VISIT:  New Prescriptions   FOSFOMYCIN (MONUROL) 3 G PACK    Take 3 g by mouth once. Please mix in 8 oz of water, take by mouth once on 09/03/2016     Note:  This document was prepared using Dragon voice recognition software and may include unintentional dictation errors.     Delman Kitten, MD 08/30/16 1135

## 2016-08-30 NOTE — Discharge Instructions (Signed)
You have been seen in the Emergency Department (ED) today for blood in your urine.  Your workup today suggests that you may a urinary tract infection (UTI) causing this.  Call Dr. Tonette Bihari office today to discuss your symptoms and also see if he wishes to adjust your Xarelto.  Please take your antibiotic as prescribed and over-the-counter pain medication (Tylenol or Motrin) as needed, but no more than recommended on the label instructions.  Drink PLENTY of fluids.  Call your regular doctor to schedule the next available appointment to follow up on today?s ED visit, or return immediately to the ED if your pain worsens, you have decreased urine production, develop fever, persistent vomiting, or other symptoms that concern you.

## 2016-08-31 LAB — URINE CULTURE
Culture: NO GROWTH
Special Requests: NORMAL

## 2016-09-16 ENCOUNTER — Emergency Department: Payer: Medicare Other

## 2016-09-16 ENCOUNTER — Emergency Department
Admission: EM | Admit: 2016-09-16 | Discharge: 2016-09-16 | Disposition: A | Discharge status: Home / Self Care | Payer: Medicare Other | Attending: Emergency Medicine | Admitting: Emergency Medicine

## 2016-09-16 DIAGNOSIS — Z87891 Personal history of nicotine dependence: Secondary | ICD-10-CM | POA: Insufficient documentation

## 2016-09-16 DIAGNOSIS — Z85828 Personal history of other malignant neoplasm of skin: Secondary | ICD-10-CM | POA: Diagnosis not present

## 2016-09-16 DIAGNOSIS — I110 Hypertensive heart disease with heart failure: Secondary | ICD-10-CM | POA: Insufficient documentation

## 2016-09-16 DIAGNOSIS — I2510 Atherosclerotic heart disease of native coronary artery without angina pectoris: Secondary | ICD-10-CM | POA: Insufficient documentation

## 2016-09-16 DIAGNOSIS — Z7982 Long term (current) use of aspirin: Secondary | ICD-10-CM | POA: Insufficient documentation

## 2016-09-16 DIAGNOSIS — Z79899 Other long term (current) drug therapy: Secondary | ICD-10-CM | POA: Insufficient documentation

## 2016-09-16 DIAGNOSIS — Z8673 Personal history of transient ischemic attack (TIA), and cerebral infarction without residual deficits: Secondary | ICD-10-CM | POA: Insufficient documentation

## 2016-09-16 DIAGNOSIS — I509 Heart failure, unspecified: Secondary | ICD-10-CM | POA: Diagnosis not present

## 2016-09-16 DIAGNOSIS — Z7901 Long term (current) use of anticoagulants: Secondary | ICD-10-CM | POA: Diagnosis not present

## 2016-09-16 DIAGNOSIS — R079 Chest pain, unspecified: Secondary | ICD-10-CM | POA: Diagnosis not present

## 2016-09-16 LAB — BASIC METABOLIC PANEL
Anion gap: 8 (ref 5–15)
BUN: 10 mg/dL (ref 6–20)
CALCIUM: 9.3 mg/dL (ref 8.9–10.3)
CO2: 27 mmol/L (ref 22–32)
CREATININE: 0.73 mg/dL (ref 0.44–1.00)
Chloride: 94 mmol/L — ABNORMAL LOW (ref 101–111)
GLUCOSE: 112 mg/dL — AB (ref 65–99)
Potassium: 4.5 mmol/L (ref 3.5–5.1)
Sodium: 129 mmol/L — ABNORMAL LOW (ref 135–145)

## 2016-09-16 LAB — TROPONIN I
TROPONIN I: 0.03 ng/mL — AB (ref <0.03)
TROPONIN I: 0.03 ng/mL — AB (ref <0.03)

## 2016-09-16 LAB — CBC
HCT: 38.1 % (ref 35.0–47.0)
Hemoglobin: 12.4 g/dL (ref 12.0–16.0)
MCH: 31 pg (ref 26.0–34.0)
MCHC: 32.7 g/dL (ref 32.0–36.0)
MCV: 94.8 fL (ref 80.0–100.0)
PLATELETS: 238 10*3/uL (ref 150–440)
RBC: 4.02 MIL/uL (ref 3.80–5.20)
RDW: 14.8 % — ABNORMAL HIGH (ref 11.5–14.5)
WBC: 6.4 10*3/uL (ref 3.6–11.0)

## 2016-09-16 MED ORDER — IOPAMIDOL (ISOVUE-370) INJECTION 76%
50.0000 mL | Freq: Once | INTRAVENOUS | Status: AC | PRN
  Administered 2016-09-16: 50 mL via INTRAVENOUS

## 2016-09-16 MED ORDER — LIDOCAINE 5 % EX PTCH: 1.0000 | MEDICATED_PATCH | Freq: Two times a day (BID) | CUTANEOUS | 0 refills | Status: DC

## 2016-09-16 MED ORDER — LIDOCAINE 5 % EX PTCH
1.0000 | MEDICATED_PATCH | CUTANEOUS | Status: DC
  Administered 2016-09-16: 1 via TRANSDERMAL
  Filled 2016-09-16: qty 1

## 2016-09-16 NOTE — ED Provider Notes (Signed)
-----------------------------------------   5:38 PM on 09/16/2016 -----------------------------------------  Patient was signed out to me by Dr. Archie Balboa, according to his plan, she is to be discharged for troponin is negative and she is asymptomatic. Patient has no symptoms she is eager to go. She has a known low sodium which they are working on as an outpatient. Pain or shortness of breath. She is somewhat anxious her blood pressure is mildly elevated but she is asymptomatic. Serial enzymes show no evidence of acute ischemia, and we will therefore use Dr. Gennette Pac discharge instructions to discharge her. Return precautions and follow-up given and understood   Schuyler Amor, MD 09/16/16 1739

## 2016-09-16 NOTE — ED Notes (Signed)
Pt assisted to bathroom. Given warm blankets.

## 2016-09-16 NOTE — ED Notes (Signed)
Escorted pt to bathroom ° °

## 2016-09-16 NOTE — Discharge Instructions (Signed)
Please seek medical attention for any high fevers, chest pain, shortness of breath, change in behavior, persistent vomiting, bloody stool or any other new or concerning symptoms.  

## 2016-09-16 NOTE — ED Provider Notes (Signed)
[facility] Emergency Department Provider Note    ____________________________________________   I have reviewed the triage vital signs and the nursing notes.   HISTORY  Chief Complaint Chest Pain   History limited by: Not Limited   HPI Brenda Roman is a 83 y.o. female who presents to the emergency department today because of concerns for chest pain. Is located on the left side of her chest. Started 2 weeks ago. It is worse with movement. If she is just lying still she states it is not bad. She has stenosis that is also bad when she takes a deep breath. She denies similar pain in the past. Denies any trauma. No nausea or vomiting.    Past Medical History:  Diagnosis Date  . Aortic stenosis   . 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 Cancer ([place])    skin  . CHF (congestive heart failure) ([place])   . Coronary artery disease   . CVA (cerebral infarction) June '16  . Hypertension   . Liver transplanted ([place])   . Macular degeneration   . Mitral valve disease   . PBC (primary biliary cirrhosis)    s/p liver transplant  . Shingles   . TIA (transient ischemic attack)     Patient Active Problem List   Diagnosis Date Noted  . Arrhythmia, ventricular 09/19/2015  . Hyponatremia 07/30/2015  . Acute CHF (congestive heart failure) ([place]) 07/18/2015  . 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 Closed wedge fracture of lumbar vertebra ([place]) 10/11/2014  . Ischemic stroke ([place]) 10/07/2014  . Acute urinary tract infection 10/07/2014  . 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 Closed fracture of humerus, surgical neck 09/29/2014  . Goals of care, counseling/discussion   . Acute encephalopathy 08/29/2014  . Hypoxia 08/29/2014  . Healthcare-associated pneumonia 08/29/2014  . Pneumonia 08/29/2014  . CVA (cerebral infarction) 08/23/2014  . Arteriosclerosis of coronary artery 03/15/2014  . Paroxysmal atrial fibrillation ([place]) 03/15/2014  . Atherosclerosis of abdominal aorta  ([place]) 01/27/2014  . HLD (hyperlipidemia) 04/07/2012  . BP (high blood pressure) 04/07/2012  . History of liver transplant ([place]) 04/03/2012  . Encounter for other procedures for purposes other than remedying health state 04/03/2012  . Prolapse of urethra 03/10/2012  . Pelvic relaxation due to uterovaginal prolapse 03/10/2012    Past Surgical History:  Procedure Laterality Date  . BOWEL RESECTION    . COLONOSCOPY    . CORONARY ARTERY BYPASS GRAFT    . JOINT REPLACEMENT    . LIVER TRANSPLANT  1990    Prior to Admission medications   Medication Sig Start Date End Date Taking? Authorizing Provider  acetaminophen (TYLENOL) 325 MG tablet Take 2 tablets by mouth every 6 (six) hours as needed. Reported on 10/26/2015    [provider]  aspirin EC 81 MG tablet Take 81 mg by mouth daily.    [provider]  ciprofloxacin (CIPRO) 250 MG tablet Take 1 tablet (250 mg total) by mouth 2 (two) times daily. Patient not taking: Reported on 08/30/2016 10/28/15   Zara Council A, PA-C  ezetimibe-simvastatin (VYTORIN) 10-20 MG tablet Take 1 tablet by mouth 3 (three) times a week. Patient takes on Monday, Wednesday, Friday    [provider]  gabapentin (NEURONTIN) 100 MG capsule Take 100 mg by mouth at bedtime.     [provider]  methenamine (HIPREX) 1 g tablet Take 1 tablet (1 g total) by mouth 2 (two) times daily with a meal. Patient not taking:  Reported on 08/30/2016 10/26/15   Zara Council A, PA-C  metoprolol tartrate (LOPRESSOR) 25 MG tablet Take 1 tablet (25 mg total) by mouth 2 (two) times daily. 09/21/15   Fritzi Mandes, MD  mycophenolate (CELLCEPT) 500 MG tablet Take 500 mg by mouth every 12 (twelve) hours.    [provider]  nitrofurantoin, macrocrystal-monohydrate, (MACROBID) 100 MG capsule Take 1 capsule (100 mg total) by mouth every 12 (twelve) hours. Patient not taking: Reported on 08/30/2016 11/21/15   Zara Council A, PA-C  nitroGLYCERIN  (NITROSTAT) 0.4 MG SL tablet Place 1 tablet under the tongue as needed. Reported on 10/26/2015 06/20/15   [provider]  predniSONE (DELTASONE) 5 MG tablet Take 5 mg by mouth daily.    [provider]  Rivaroxaban (XARELTO) 15 MG TABS tablet Take 7.5 mg by mouth daily with supper.     [provider]  senna-docusate (SENOKOT-S) 8.6-50 MG per tablet Take 3 tablets by mouth at bedtime.     [provider]  valACYclovir (VALTREX) 1000 MG tablet Take 1,000 mg by mouth every evening. Reported on 10/26/2015    [provider]  zaleplon (SONATA) 5 MG capsule Take 1 capsule by mouth at bedtime. 09/13/15 10/13/15  [provider]    Allergies Cephalexin; Novocain [procaine]; and Sulfa antibiotics  Family History  Problem Relation Age of Onset  . Hypertension Father     Social History Social History  Substance Use Topics  . Smoking status: Former Research scientist (life sciences)  . Smokeless tobacco: Never Used  . Alcohol use No    Review of Systems Constitutional: No fever/chills Eyes: Blind ENT: No sore throat. Cardiovascular: Positive chest pain. Respiratory: Denies shortness of breath. Gastrointestinal: No abdominal pain.  No nausea, no vomiting.  No diarrhea.   Genitourinary: Negative for dysuria. Musculoskeletal: Negative for back pain. Skin: Negative for rash. Neurological: Negative for headaches, focal weakness or numbness.  ____________________________________________   PHYSICAL EXAM:  VITAL SIGNS: ED Triage Vitals  Enc Vitals Group     BP 09/16/16 1303 (!) 194/79     Pulse Rate 09/16/16 1303 76     Resp --      Temp 09/16/16 1303 98.5 F (36.9 C)     Temp Source 09/16/16 1303 Oral     SpO2 09/16/16 1303 99 %     Weight 09/16/16 1303 114 lb (51.7 kg)     Height 09/16/16 1303 5\' 2"  (1.575 m)     Head Circumference --      Peak Flow --      Pain Score 09/16/16 1302 6   Constitutional: Alert and oriented. Well appearing and in no  distress. Eyes: Conjunctivae are normal.  ENT   Head: Normocephalic and atraumatic.   Nose: No congestion/rhinnorhea.   Mouth/Throat: Mucous membranes are moist.   Neck: No stridor. Hematological/Lymphatic/Immunilogical: No cervical lymphadenopathy. Cardiovascular: Normal rate, regular rhythm.  No murmurs, rubs, or gallops.  Respiratory: Normal respiratory effort without tachypnea nor retractions. Breath sounds are clear and equal bilaterally. No wheezes/rales/rhonchi. Gastrointestinal: Soft and non tender. No rebound. No guarding.  Genitourinary: Deferred Musculoskeletal: Mild tenderness to left chest wall. Neurologic:  Normal speech and language. Blind. No gross focal neurologic deficits are appreciated.  Skin:  Skin is warm, dry and intact. No rash noted. Psychiatric: Mood and affect are normal. Speech and behavior are normal. Patient exhibits appropriate insight and judgment.  ____________________________________________    LABS (pertinent positives/negatives)  Labs Reviewed  BASIC METABOLIC PANEL - Abnormal; Notable for the  following:       Result Value   Sodium 129 (*)    Chloride 94 (*)    Glucose, Bld 112 (*)    All other components within normal limits  CBC - Abnormal; Notable for the following:    RDW 14.8 (*)    All other components within normal limits  TROPONIN I - Abnormal; Notable for the following:    Troponin I 0.03 (*)    All other components within normal limits  TROPONIN I     ____________________________________________   EKG  I, Nance Pear, attending physician, personally viewed and interpreted this EKG  EKG Time: 1307 Rate: 62 Rhythm: sinus rhythm Axis: left axis deviation Intervals: qtc 485 QRS: LBBB ST changes: no st elevation equivalent Impression: abnormal ekg  LBBB present in ekg dated 09/19/15 ____________________________________________    RADIOLOGY  CXR IMPRESSION: 1. Peribronchial cuffing and patchy areas  of interstitial prominence concerning for an acute bronchitis. 2. Masslike soft tissue thickening in the region of the right hilar region. The possibility of underlying neoplasm or lymphadenopathy should be considered, and further evaluation with nonemergent chest CT is recommended in the near future (preferably with IV contrast). 3. Mild cardiomegaly. 4. Aortic atherosclerosis.   ____________________________________________   PROCEDURES  Procedures  ____________________________________________   INITIAL IMPRESSION / ASSESSMENT AND PLAN / ED COURSE  Pertinent labs & imaging results that were available during my care of the patient were reviewed by me and considered in my medical decision making (see chart for details).  Patient presented to the emergency department today because of concerns for left-sided chest pain. Initial troponin was minimally elevated at 0.03. We will recheck this. I did obtain a CT angiogram which did not show any acute findings. Patient was signed out awaiting second troponin however I feel if it is unchanged are negative she is safe for discharge. Think likely musculoskeletal cause of the pain. Will give prescription for Lidoderm patch.  ____________________________________________   FINAL CLINICAL IMPRESSION(S) / ED DIAGNOSES  Final diagnoses:  Left sided chest pain     Note: This dictation was prepared with Dragon dictation. Any transcriptional errors that result from this process are unintentional     Nance Pear, MD 09/16/16 1600

## 2016-09-16 NOTE — ED Notes (Signed)
Updated pt on plan.

## 2016-09-16 NOTE — ED Triage Notes (Signed)
Left sided rib and chest pain intermittently X 3 weeks. No fall. Pt has difficulty hearing and is blind. Husband at bedside. Pt alert and oriented X4, active, cooperative, pt in NAD. RR even and unlabored, color WNL.

## 2016-09-16 NOTE — ED Notes (Signed)
   Test: Troponin Critical Value: 0.03  Name of Provider Notified: Dr. Archie Balboa

## 2016-09-20 ENCOUNTER — Other Ambulatory Visit: Payer: Self-pay | Admitting: Student

## 2016-09-20 ENCOUNTER — Ambulatory Visit
Admission: RE | Admit: 2016-09-20 | Discharge: 2016-09-20 | Disposition: A | Discharge status: Home / Self Care | Payer: Medicare Other | Source: Ambulatory Visit | Attending: Student | Admitting: Student

## 2016-09-20 DIAGNOSIS — M4856XA Collapsed vertebra, not elsewhere classified, lumbar region, initial encounter for fracture: Secondary | ICD-10-CM | POA: Diagnosis not present

## 2016-09-20 DIAGNOSIS — R109 Unspecified abdominal pain: Secondary | ICD-10-CM | POA: Insufficient documentation

## 2016-09-20 DIAGNOSIS — K449 Diaphragmatic hernia without obstruction or gangrene: Secondary | ICD-10-CM | POA: Diagnosis not present

## 2016-09-20 DIAGNOSIS — K5730 Diverticulosis of large intestine without perforation or abscess without bleeding: Secondary | ICD-10-CM | POA: Diagnosis not present

## 2016-09-20 DIAGNOSIS — I700 Atherosclerosis of aorta: Secondary | ICD-10-CM | POA: Insufficient documentation

## 2016-09-20 DIAGNOSIS — M8580 Other specified disorders of bone density and structure, unspecified site: Secondary | ICD-10-CM | POA: Diagnosis not present

## 2016-09-20 DIAGNOSIS — X58XXXD Exposure to other specified factors, subsequent encounter: Secondary | ICD-10-CM | POA: Diagnosis not present

## 2016-09-20 DIAGNOSIS — M4854XA Collapsed vertebra, not elsewhere classified, thoracic region, initial encounter for fracture: Secondary | ICD-10-CM | POA: Diagnosis not present

## 2016-09-20 DIAGNOSIS — N281 Cyst of kidney, acquired: Secondary | ICD-10-CM | POA: Insufficient documentation

## 2016-09-20 DIAGNOSIS — I517 Cardiomegaly: Secondary | ICD-10-CM | POA: Insufficient documentation

## 2016-09-20 DIAGNOSIS — J479 Bronchiectasis, uncomplicated: Secondary | ICD-10-CM | POA: Insufficient documentation

## 2016-09-20 DIAGNOSIS — S32591D Other specified fracture of right pubis, subsequent encounter for fracture with routine healing: Secondary | ICD-10-CM | POA: Diagnosis not present

## 2016-10-09 ENCOUNTER — Ambulatory Visit (INDEPENDENT_AMBULATORY_CARE_PROVIDER_SITE_OTHER): Payer: Medicare Other | Admitting: Urology

## 2016-10-09 ENCOUNTER — Encounter: Payer: Self-pay | Admitting: Urology

## 2016-10-09 VITALS — BP 149/68 | HR 60 | Ht 62.0 in

## 2016-10-09 DIAGNOSIS — N390 Urinary tract infection, site not specified: Secondary | ICD-10-CM

## 2016-10-09 DIAGNOSIS — I509 Heart failure, unspecified: Secondary | ICD-10-CM

## 2016-10-09 DIAGNOSIS — I480 Paroxysmal atrial fibrillation: Secondary | ICD-10-CM | POA: Diagnosis not present

## 2016-10-09 LAB — URINALYSIS, COMPLETE
BILIRUBIN UA: NEGATIVE
GLUCOSE, UA: NEGATIVE
Ketones, UA: NEGATIVE
Nitrite, UA: NEGATIVE
PH UA: 6.5 (ref 5.0–7.5)
PROTEIN UA: NEGATIVE
Specific Gravity, UA: 1.01 (ref 1.005–1.030)
UUROB: 0.2 mg/dL (ref 0.2–1.0)

## 2016-10-09 LAB — MICROSCOPIC EXAMINATION

## 2016-10-09 LAB — BLADDER SCAN AMB NON-IMAGING: SCAN RESULT: 0

## 2016-10-09 MED ORDER — NITROFURANTOIN MACROCRYSTAL 50 MG PO CAPS: 50.0000 mg | ORAL_CAPSULE | Freq: Every day | ORAL | 2 refills | Status: AC

## 2016-10-09 NOTE — Progress Notes (Signed)
6:10 PM   Brenda Roman [birth year] [id number]  Referring provider: Kirk Ruths, MD [facility] [place], [place] [address]  Chief Complaint  Patient presents with  . Recurrent UTI    HPI: Patient is an 83 year old WF who presents today for a follow-up visit for recurrent urinary tract infections.  Patient has a history of CHF, s/p liver transplant, TIA, macular degeneration and blindness currently under hospice care.   She was recently hospitalized for an enterococcus UTI, chronic oral anticoagulation and paroxysmal Afib.  She presented to the emergency department complaining of an elevation in blood pressure, and increase in heart rate and chest pain.  Patient's husband states that she was catheterized for urine specimen and it grew out enterococcus.  She was seen after discharge by her primary care physician and a repeat urine culture was conducted. The husband states that this specimen was not catheterized.  The culture grew out Escherichia coli.  Today, the husband states that her urine is cloudy.  The patient states that she is not experiencing dysuria, gross hematuria or suprapubic pain.  She is not had any recent fevers, chills, nausea or vomiting.  She does state that she is having nocturia 3-4 nightly. She states that she has very and frequent urination during the day and her stream is weak.  Her cath UA today was nitrite positive with > 30 WBC's/hpf.     She has been using her vaginal estrogen cream 3 nights weekly.  Interval: The patient presents today for annual follow-up. She has had several urinary tract infections over the last 3-6 months. She most recently was treated for an infection with nitrofurantoin successfully. The patient is blind following an ischemic stroke. She does urinate frequently and gets up several times at night. Her symptoms improve on antibiotics.   PMH: Past Medical History:  Diagnosis Date  . Aortic stenosis    . 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 Cancer ([place])    skin  . CHF (congestive heart failure) ([place])   . Coronary artery disease   . CVA (cerebral infarction) June '16  . Hypertension   . Liver transplanted ([place])   . Macular degeneration   . Mitral valve disease   . PBC (primary biliary cirrhosis)    s/p liver transplant  . Shingles   . TIA (transient ischemic attack)     Surgical History: Past Surgical History:  Procedure Laterality Date  . BOWEL RESECTION    . COLONOSCOPY    . CORONARY ARTERY BYPASS GRAFT    . JOINT REPLACEMENT    . LIVER TRANSPLANT  1990    Home Medications:  Allergies as of 10/09/2016      Reactions   Cephalexin Other (See Comments)   Reaction:  Unknown   Novocain [procaine] Rash   Sulfa Antibiotics Rash      Medication List       Accurate as of 10/09/16  6:10 PM. Always use your most recent med list.          acetaminophen 325 MG tablet Commonly known as:  TYLENOL Take 2 tablets by mouth every 6 (six) hours as needed. Reported on 10/26/2015   aspirin EC 81 MG tablet Take 81 mg by mouth daily.   ezetimibe-simvastatin 10-20 MG tablet Commonly known as:  VYTORIN Take 1 tablet by mouth 3 (three) times a week. Patient takes on Monday, Wednesday, Friday   lidocaine 5 % Commonly known as:  LIDODERM  Place 1 patch onto the skin every 12 (twelve) hours. Remove & Discard patch within 12 hours or as directed by MD   methenamine 1 g tablet Commonly known as:  HIPREX Take 1 tablet (1 g total) by mouth 2 (two) times daily with a meal.   metoprolol tartrate 25 MG tablet Commonly known as:  LOPRESSOR Take 1 tablet (25 mg total) by mouth 2 (two) times daily.   mirtazapine 7.5 MG tablet Commonly known as:  REMERON Take 7.5 mg by mouth at bedtime.   mycophenolate 500 MG tablet Commonly known as:  CELLCEPT Take 500 mg by mouth every 12 (twelve) hours.   nitrofurantoin 50 MG capsule Commonly known as:  MACRODANTIN Take 1 capsule (50 mg total) by mouth  at bedtime.   nitroGLYCERIN 0.4 MG SL tablet Commonly known as:  NITROSTAT Place 1 tablet under the tongue as needed. Reported on 10/26/2015   predniSONE 5 MG tablet Commonly known as:  DELTASONE Take 5 mg by mouth daily.   Rivaroxaban 15 MG Tabs tablet Commonly known as:  XARELTO Take 7.5 mg by mouth daily with supper.   senna-docusate 8.6-50 MG tablet Commonly known as:  Senokot-S Take 1 tablet by mouth at bedtime.   valACYclovir 1000 MG tablet Commonly known as:  VALTREX Take 1,000 mg by mouth every evening. Reported on 10/26/2015   zaleplon 5 MG capsule Commonly known as:  SONATA Take 1 capsule by mouth at bedtime.       Allergies:  Allergies  Allergen Reactions  . Cephalexin Other (See Comments)    Reaction:  Unknown  . Novocain [Procaine] Rash  . Sulfa Antibiotics Rash    Family History: Family History  Problem Relation Age of Onset  . Hypertension Father     Social History:  reports that she has quit smoking. She has never used smokeless tobacco. She reports that she does not drink alcohol or use drugs.  ROS: UROLOGY Frequent Urination?: Yes Hard to postpone urination?: Yes Burning/pain with urination?: No Get up at night to urinate?: Yes Leakage of urine?: No Urine stream starts and stops?: No Trouble starting stream?: No Do you have to strain to urinate?: No Blood in urine?: No Urinary tract infection?: Yes Sexually transmitted disease?: No Injury to kidneys or bladder?: No Painful intercourse?: No Weak stream?: No Currently pregnant?: No Vaginal bleeding?: No Last menstrual period?: n  Gastrointestinal Nausea?: No Vomiting?: No Indigestion/heartburn?: No Diarrhea?: No Constipation?: Yes  Constitutional Fever: No Night sweats?: No Weight loss?: No Fatigue?: No  Skin Skin rash/lesions?: No Itching?: No  Eyes Blurred vision?: No Double vision?: No  Ears/Nose/Throat Sore throat?: No Sinus problems?:  No  Hematologic/Lymphatic Swollen glands?: No Easy bruising?: No  Cardiovascular Leg swelling?: No Chest pain?: No  Respiratory Cough?: No Shortness of breath?: No  Endocrine Excessive thirst?: No  Musculoskeletal Back pain?: No Joint pain?: No  Neurological Headaches?: No Dizziness?: No  Psychologic Depression?: No Anxiety?: No  Physical Exam: BP (!) 149/68 (BP Location: Left Arm, Patient Position: Sitting, Cuff Size: Normal)   Pulse 60   Ht 5\' 2"  (1.575 m)   Constitutional: Well nourished. Alert and oriented, No acute distress. HEENT: [place] AT, moist mucus membranes. Trachea midline, no masses. Skin: No rashes, bruises or suspicious lesions. Lymph: No cervical or inguinal adenopathy. Neurologic: Grossly intact, no focal deficits, moving all 4 extremities. Psychiatric: Normal mood and affect.  Laboratory Data:  Urinalysis: Results for orders placed or performed in visit on 10/09/16  Microscopic Examination  Result Value Ref Range   WBC, UA 11-30 (A) 0 - 5 /hpf   RBC, UA 0-2 0 - 2 /hpf   Epithelial Cells (non renal) 0-10 0 - 10 /hpf   Bacteria, UA Few (A) None seen/Few  Urinalysis, Complete  Result Value Ref Range   Specific Gravity, UA 1.010 1.005 - 1.030   pH, UA 6.5 5.0 - 7.5   Color, UA Yellow Yellow   Appearance Ur Cloudy (A) Clear   Leukocytes, UA 2+ (A) Negative   Protein, UA Negative Negative/Trace   Glucose, UA Negative Negative   Ketones, UA Negative Negative   RBC, UA 1+ (A) Negative   Bilirubin, UA Negative Negative   Urobilinogen, Ur 0.2 0.2 - 1.0 mg/dL   Nitrite, UA Negative Negative   Microscopic Examination See below:   Bladder Scan (Post Void Residual) in office  Result Value Ref Range   Scan Result 0       Pertinent Imaging:  None today  Assessment & Plan:    I spoke to the patient about urinary tract infection and recurrence and the management strategy for. We discussed the use of a Lactobacillus probiotic as well as  cranberry tablets twice daily. I also educated the patient about the significance of minimizing her constipation. Given that the patient recently has had several hospitalizations for urinary tract infections, I recommended that we put the patient on suppression antibiotics. I've prescribed for her nitrofurantoin 50 mg daily at bedtime. We'll plan the patient follow-up with [place] in 3 months to reevaluate her symptoms.   Return in about 3 months (around 01/09/2017).  These notes generated with voice recognition software. I apologize for typographical errors.  Ardis Hughs, [facility] [address] [place], [place] [address] [phone]

## 2016-10-17 ENCOUNTER — Telehealth: Payer: Self-pay

## 2016-10-17 ENCOUNTER — Other Ambulatory Visit: Payer: Medicare Other

## 2016-10-17 ENCOUNTER — Ambulatory Visit: Payer: Medicare Other

## 2016-10-17 VITALS — BP 174/72 | HR 61

## 2016-10-17 DIAGNOSIS — R300 Dysuria: Secondary | ICD-10-CM

## 2016-10-17 DIAGNOSIS — N390 Urinary tract infection, site not specified: Secondary | ICD-10-CM

## 2016-10-17 NOTE — Telephone Encounter (Signed)
Son walked in earlier stating patient having UTI sx(s). Requested a urine cup to take home b/c unable to get patient to come into the office. Called son, gave results. Advised son urine sample showed many bacteria and asked if he could bring pt in for cath specimen today. Son verbalized understanding and agreed to bring pt in by 4pm today.

## 2016-10-17 NOTE — Progress Notes (Signed)
Son came in earlier stating pt c/o having UTI. Son was given urine cup b/c pt did not want to come in.  Provided urine sample. Results showed possible contamination.Brought pt back in for a cath urinalysis.  Pt is currently on nitrofurantoin. Advised pt would call w/ results b/c pt very anxious to get out of office. Patient and son verbalized understanding.

## 2016-10-18 ENCOUNTER — Telehealth: Payer: Self-pay

## 2016-10-18 LAB — URINALYSIS, COMPLETE
BILIRUBIN UA: NEGATIVE
GLUCOSE, UA: NEGATIVE
KETONES UA: NEGATIVE
Nitrite, UA: POSITIVE — AB
SPEC GRAV UA: 1.01 (ref 1.005–1.030)
Urobilinogen, Ur: 0.2 mg/dL (ref 0.2–1.0)
pH, UA: 6 (ref 5.0–7.5)

## 2016-10-18 LAB — MICROSCOPIC EXAMINATION
Epithelial Cells (non renal): NONE SEEN /hpf (ref 0–10)
RBC, UA: NONE SEEN /hpf (ref 0–?)
WBC, UA: >30 /hpf — ABNORMAL HIGH (ref 0–?)

## 2016-10-18 NOTE — Telephone Encounter (Signed)
Called patient to advise waiting on Ucx results. No answer.

## 2016-10-19 ENCOUNTER — Other Ambulatory Visit: Payer: Self-pay

## 2016-10-19 MED ORDER — CIPROFLOXACIN HCL 500 MG PO TABS: 500.0000 mg | ORAL_TABLET | Freq: Two times a day (BID) | ORAL | 0 refills | Status: DC

## 2016-10-22 NOTE — Telephone Encounter (Signed)
Patient's son requesting Ucx results. Son came by office on Friday requesting med b/c pt continues to complain of urination frequency and he did not want her to go through the weekend w/out anything. Dr. Pilar Jarvis Rx'd Cipro 500.

## 2016-10-23 ENCOUNTER — Telehealth: Payer: Self-pay

## 2016-10-23 LAB — URINE CULTURE

## 2016-10-23 NOTE — Telephone Encounter (Signed)
Spoke to son. Gave results. He verbalized understanding.

## 2016-10-23 NOTE — Telephone Encounter (Signed)
-----   Message from Nori Riis, PA-C sent at 10/23/2016  1:20 PM EDT ----- Please let Mrs. Kovacik's son no that the urine culture has been finalized. The ciprofloxacin that was prescribed to her on Friday is appropriate antibiotic.

## 2016-11-20 ENCOUNTER — Other Ambulatory Visit: Payer: Medicare Other

## 2016-11-20 ENCOUNTER — Telehealth: Payer: Self-pay

## 2016-11-20 DIAGNOSIS — N390 Urinary tract infection, site not specified: Secondary | ICD-10-CM

## 2016-11-20 LAB — URINALYSIS, COMPLETE
BILIRUBIN UA: NEGATIVE
Glucose, UA: NEGATIVE
Ketones, UA: NEGATIVE
Nitrite, UA: NEGATIVE
PH UA: 6.5 (ref 5.0–7.5)
Specific Gravity, UA: 1.02 (ref 1.005–1.030)
Urobilinogen, Ur: 0.2 mg/dL (ref 0.2–1.0)

## 2016-11-20 LAB — MICROSCOPIC EXAMINATION

## 2016-11-20 NOTE — Telephone Encounter (Signed)
Pt husband called stating pt is having dysuria, cloudy urine, and increased frequency. Husband stated that due to the rain pt is not able to get out to come in for a cath specimen. Husband will come pick up a cup for specimen. Reinforced with husband to bring urine directly in after collection and if urine is "dirty" then pt will need to come in for a cath specimen. Husband voiced understanding.

## 2016-11-21 ENCOUNTER — Telehealth: Payer: Self-pay

## 2016-11-21 NOTE — Telephone Encounter (Signed)
Spoke with pt husband in reference to cipro black box warnings, waiting for ucx results, and other ways to help prevent future infections. Husband was still upset he was not getting an abx for pt but voiced understanding.

## 2016-11-21 NOTE — Telephone Encounter (Signed)
Pt husband called stating he brought in a urine for pt yesterday and wanted to know why he did not get a script for cipro. Reinforced with husband that urine was sent for culture and abx typically are not given until culture results are back. Husband became very upset stating we are not treating them right and stated that if cipro was not going to be given he wanted to speak with the physician. Apologized to husband but also reinforced with him BUA protocol is to wait for culture results. Also made husband aware physician is in clinic was a note would be sent back. Please advise.

## 2016-11-21 NOTE — Telephone Encounter (Signed)
Please let Mr. Common know that Cipro is not to be used as a first line antibiotic due to its numerous black box warnings.  For patients with a history of recurrent UTI's, it is best to manage symptoms with non antibiotics until the culture result is available.  She should be drinking cranberry juice daily, taking a probiotic daily, taking Vitamin C daily, reducing constipation and increasing her water intake.  If they are still sexually active, I would refrain from sexual activity at this time.

## 2016-11-23 ENCOUNTER — Inpatient Hospital Stay
Admission: EM | Admit: 2016-11-23 | Discharge: 2016-11-27 | DRG: 378 | Disposition: A | Discharge status: Skilled Nursing Facility | Payer: Medicare Other | Attending: Internal Medicine | Admitting: Internal Medicine

## 2016-11-23 ENCOUNTER — Encounter: Payer: Self-pay | Admitting: *Deleted

## 2016-11-23 ENCOUNTER — Telehealth: Payer: Self-pay

## 2016-11-23 ENCOUNTER — Emergency Department: Payer: Medicare Other

## 2016-11-23 DIAGNOSIS — E871 Hypo-osmolality and hyponatremia: Secondary | ICD-10-CM | POA: Diagnosis present

## 2016-11-23 DIAGNOSIS — I110 Hypertensive heart disease with heart failure: Secondary | ICD-10-CM | POA: Diagnosis present

## 2016-11-23 DIAGNOSIS — I69351 Hemiplegia and hemiparesis following cerebral infarction affecting right dominant side: Secondary | ICD-10-CM | POA: Diagnosis not present

## 2016-11-23 DIAGNOSIS — Z884 Allergy status to anesthetic agent status: Secondary | ICD-10-CM

## 2016-11-23 DIAGNOSIS — R1030 Lower abdominal pain, unspecified: Secondary | ICD-10-CM | POA: Diagnosis not present

## 2016-11-23 DIAGNOSIS — Z881 Allergy status to other antibiotic agents status: Secondary | ICD-10-CM

## 2016-11-23 DIAGNOSIS — Z9660 Presence of unspecified orthopedic joint implant: Secondary | ICD-10-CM | POA: Diagnosis present

## 2016-11-23 DIAGNOSIS — Z951 Presence of aortocoronary bypass graft: Secondary | ICD-10-CM

## 2016-11-23 DIAGNOSIS — K5792 Diverticulitis of intestine, part unspecified, without perforation or abscess without bleeding: Secondary | ICD-10-CM | POA: Diagnosis present

## 2016-11-23 DIAGNOSIS — Z8249 Family history of ischemic heart disease and other diseases of the circulatory system: Secondary | ICD-10-CM

## 2016-11-23 DIAGNOSIS — B9620 Unspecified Escherichia coli [E. coli] as the cause of diseases classified elsewhere: Secondary | ICD-10-CM | POA: Diagnosis present

## 2016-11-23 DIAGNOSIS — Z87891 Personal history of nicotine dependence: Secondary | ICD-10-CM

## 2016-11-23 DIAGNOSIS — K5900 Constipation, unspecified: Secondary | ICD-10-CM | POA: Diagnosis present

## 2016-11-23 DIAGNOSIS — Z7982 Long term (current) use of aspirin: Secondary | ICD-10-CM | POA: Diagnosis not present

## 2016-11-23 DIAGNOSIS — Z85828 Personal history of other malignant neoplasm of skin: Secondary | ICD-10-CM

## 2016-11-23 DIAGNOSIS — I700 Atherosclerosis of aorta: Secondary | ICD-10-CM | POA: Diagnosis present

## 2016-11-23 DIAGNOSIS — I480 Paroxysmal atrial fibrillation: Secondary | ICD-10-CM | POA: Diagnosis present

## 2016-11-23 DIAGNOSIS — I959 Hypotension, unspecified: Secondary | ICD-10-CM | POA: Diagnosis present

## 2016-11-23 DIAGNOSIS — I080 Rheumatic disorders of both mitral and aortic valves: Secondary | ICD-10-CM | POA: Diagnosis present

## 2016-11-23 DIAGNOSIS — I2510 Atherosclerotic heart disease of native coronary artery without angina pectoris: Secondary | ICD-10-CM | POA: Diagnosis present

## 2016-11-23 DIAGNOSIS — H3530 Unspecified macular degeneration: Secondary | ICD-10-CM | POA: Diagnosis present

## 2016-11-23 DIAGNOSIS — H548 Legal blindness, as defined in USA: Secondary | ICD-10-CM | POA: Diagnosis present

## 2016-11-23 DIAGNOSIS — K5733 Diverticulitis of large intestine without perforation or abscess with bleeding: Secondary | ICD-10-CM | POA: Diagnosis present

## 2016-11-23 DIAGNOSIS — N390 Urinary tract infection, site not specified: Secondary | ICD-10-CM | POA: Diagnosis present

## 2016-11-23 DIAGNOSIS — R52 Pain, unspecified: Secondary | ICD-10-CM

## 2016-11-23 DIAGNOSIS — R319 Hematuria, unspecified: Secondary | ICD-10-CM

## 2016-11-23 DIAGNOSIS — Z944 Liver transplant status: Secondary | ICD-10-CM

## 2016-11-23 DIAGNOSIS — N179 Acute kidney failure, unspecified: Secondary | ICD-10-CM | POA: Diagnosis present

## 2016-11-23 DIAGNOSIS — I509 Heart failure, unspecified: Secondary | ICD-10-CM | POA: Diagnosis present

## 2016-11-23 DIAGNOSIS — Z882 Allergy status to sulfonamides status: Secondary | ICD-10-CM

## 2016-11-23 LAB — CULTURE, URINE COMPREHENSIVE

## 2016-11-23 LAB — COMPREHENSIVE METABOLIC PANEL
ALBUMIN: 3.5 g/dL (ref 3.5–5.0)
ALT: 17 U/L (ref 14–54)
AST: 42 U/L — AB (ref 15–41)
Alkaline Phosphatase: 59 U/L (ref 38–126)
Anion gap: 7 (ref 5–15)
BILIRUBIN TOTAL: 0.8 mg/dL (ref 0.3–1.2)
BUN: 13 mg/dL (ref 6–20)
CO2: 24 mmol/L (ref 22–32)
CREATININE: 0.86 mg/dL (ref 0.44–1.00)
Calcium: 8.8 mg/dL — ABNORMAL LOW (ref 8.9–10.3)
Chloride: 98 mmol/L — ABNORMAL LOW (ref 101–111)
GFR calc Af Amer: >60 mL/min (ref >60)
GFR, EST NON AFRICAN AMERICAN: 58 mL/min — AB (ref >60)
GLUCOSE: 87 mg/dL (ref 65–99)
POTASSIUM: 4.3 mmol/L (ref 3.5–5.1)
Sodium: 129 mmol/L — ABNORMAL LOW (ref 135–145)
TOTAL PROTEIN: 6.2 g/dL — AB (ref 6.5–8.1)

## 2016-11-23 LAB — CBC
HEMATOCRIT: 34.5 % — AB (ref 35.0–47.0)
Hemoglobin: 11.5 g/dL — ABNORMAL LOW (ref 12.0–16.0)
MCH: 33.1 pg (ref 26.0–34.0)
MCHC: 33.3 g/dL (ref 32.0–36.0)
MCV: 99.3 fL (ref 80.0–100.0)
PLATELETS: 247 10*3/uL (ref 150–440)
RBC: 3.48 MIL/uL — ABNORMAL LOW (ref 3.80–5.20)
RDW: 14.6 % — AB (ref 11.5–14.5)
WBC: 11.4 10*3/uL — ABNORMAL HIGH (ref 3.6–11.0)

## 2016-11-23 LAB — URINALYSIS, COMPLETE (UACMP) WITH MICROSCOPIC
BILIRUBIN URINE: NEGATIVE
Glucose, UA: NEGATIVE mg/dL
Ketones, ur: 5 mg/dL — AB
Nitrite: POSITIVE — AB
PROTEIN: 30 mg/dL — AB
SQUAMOUS EPITHELIAL / LPF: NONE SEEN
Specific Gravity, Urine: 1.013 (ref 1.005–1.030)
pH: 5 (ref 5.0–8.0)

## 2016-11-23 LAB — TYPE AND SCREEN
ABO/RH(D): B POS
ANTIBODY SCREEN: NEGATIVE

## 2016-11-23 MED ORDER — IOPAMIDOL (ISOVUE-300) INJECTION 61%
100.0000 mL | Freq: Once | INTRAVENOUS | Status: AC | PRN
  Administered 2016-11-23: 100 mL via INTRAVENOUS

## 2016-11-23 MED ORDER — ACETAMINOPHEN 325 MG PO TABS
650.0000 mg | ORAL_TABLET | Freq: Four times a day (QID) | ORAL | Status: DC | PRN
  Administered 2016-11-23: 325 mg via ORAL
  Administered 2016-11-24 – 2016-11-25 (×2): 650 mg via ORAL
  Administered 2016-11-26: 325 mg via ORAL
  Filled 2016-11-23 (×5): qty 2

## 2016-11-23 MED ORDER — ACETAMINOPHEN 325 MG PO TABS
325.0000 mg | ORAL_TABLET | Freq: Once | ORAL | Status: DC
  Filled 2016-11-23: qty 1

## 2016-11-23 MED ORDER — MYCOPHENOLATE MOFETIL 250 MG PO CAPS
500.0000 mg | ORAL_CAPSULE | Freq: Two times a day (BID) | ORAL | Status: DC
  Administered 2016-11-23 – 2016-11-27 (×8): 500 mg via ORAL
  Filled 2016-11-23 (×9): qty 2

## 2016-11-23 MED ORDER — ZOLPIDEM TARTRATE 5 MG PO TABS: 5.0000 mg | ORAL_TABLET | Freq: Every evening | ORAL | Status: DC | PRN

## 2016-11-23 MED ORDER — METHENAMINE HIPPURATE 1 G PO TABS: 1.0000 g | ORAL_TABLET | Freq: Two times a day (BID) | ORAL | Status: DC

## 2016-11-23 MED ORDER — DOCUSATE SODIUM 100 MG PO CAPS
100.0000 mg | ORAL_CAPSULE | Freq: Every day | ORAL | Status: DC
  Administered 2016-11-24: 100 mg via ORAL
  Filled 2016-11-23: qty 1

## 2016-11-23 MED ORDER — SIMVASTATIN 20 MG PO TABS
20.0000 mg | ORAL_TABLET | ORAL | Status: DC
  Administered 2016-11-26: 20 mg via ORAL
  Filled 2016-11-23: qty 1

## 2016-11-23 MED ORDER — RIVAROXABAN 15 MG PO TABS
15.0000 mg | ORAL_TABLET | Freq: Every day | ORAL | Status: DC
  Administered 2016-11-24: 7.5 mg via ORAL
  Filled 2016-11-23 (×2): qty 1

## 2016-11-23 MED ORDER — METOPROLOL SUCCINATE ER 25 MG PO TB24
25.0000 mg | ORAL_TABLET | Freq: Every day | ORAL | Status: DC
  Administered 2016-11-25 – 2016-11-26 (×2): 25 mg via ORAL
  Filled 2016-11-23 (×3): qty 1

## 2016-11-23 MED ORDER — SODIUM CHLORIDE 0.9 % IV SOLN
INTRAVENOUS | Status: AC
  Administered 2016-11-23: 21:00:00 via INTRAVENOUS

## 2016-11-23 MED ORDER — NITROGLYCERIN 0.4 MG SL SUBL: 0.4000 mg | SUBLINGUAL_TABLET | SUBLINGUAL | Status: DC | PRN

## 2016-11-23 MED ORDER — SENNOSIDES-DOCUSATE SODIUM 8.6-50 MG PO TABS
1.0000 | ORAL_TABLET | Freq: Every day | ORAL | Status: DC
  Filled 2016-11-23: qty 1

## 2016-11-23 MED ORDER — EZETIMIBE 10 MG PO TABS
10.0000 mg | ORAL_TABLET | ORAL | Status: DC
  Administered 2016-11-26: 10 mg via ORAL
  Filled 2016-11-23: qty 1

## 2016-11-23 MED ORDER — ASPIRIN EC 81 MG PO TBEC
81.0000 mg | DELAYED_RELEASE_TABLET | Freq: Every day | ORAL | Status: DC
  Administered 2016-11-24 – 2016-11-27 (×4): 81 mg via ORAL
  Filled 2016-11-23 (×4): qty 1

## 2016-11-23 MED ORDER — LIDOCAINE 5 % EX PTCH
1.0000 | MEDICATED_PATCH | Freq: Two times a day (BID) | CUTANEOUS | Status: DC
  Filled 2016-11-23 (×9): qty 1

## 2016-11-23 MED ORDER — OXYCODONE HCL 5 MG PO TABS: 5.0000 mg | ORAL_TABLET | ORAL | Status: DC | PRN

## 2016-11-23 MED ORDER — MIRTAZAPINE 15 MG PO TABS
7.5000 mg | ORAL_TABLET | Freq: Every day | ORAL | Status: DC
  Administered 2016-11-23 – 2016-11-26 (×4): 7.5 mg via ORAL
  Filled 2016-11-23 (×4): qty 1

## 2016-11-23 MED ORDER — METOPROLOL TARTRATE 25 MG PO TABS: 25.0000 mg | ORAL_TABLET | Freq: Two times a day (BID) | ORAL | Status: DC

## 2016-11-23 MED ORDER — TRAZODONE HCL 50 MG PO TABS
50.0000 mg | ORAL_TABLET | Freq: Every day | ORAL | Status: DC
  Administered 2016-11-23 – 2016-11-26 (×4): 50 mg via ORAL
  Filled 2016-11-23 (×4): qty 1

## 2016-11-23 MED ORDER — IOPAMIDOL (ISOVUE-300) INJECTION 61%
30.0000 mL | Freq: Once | INTRAVENOUS | Status: AC | PRN
  Administered 2016-11-23: 30 mL via ORAL

## 2016-11-23 MED ORDER — PIPERACILLIN-TAZOBACTAM 3.375 G IVPB 30 MIN
3.3750 g | Freq: Once | INTRAVENOUS | Status: AC
  Administered 2016-11-23: 3.375 g via INTRAVENOUS
  Filled 2016-11-23: qty 50

## 2016-11-23 MED ORDER — DEXTROSE 5 % IV SOLN
1.0000 g | Freq: Once | INTRAVENOUS | Status: AC
  Administered 2016-11-23: 1 g via INTRAVENOUS
  Filled 2016-11-23: qty 10

## 2016-11-23 MED ORDER — EZETIMIBE-SIMVASTATIN 10-20 MG PO TABS: 1.0000 | ORAL_TABLET | ORAL | Status: DC

## 2016-11-23 MED ORDER — VALACYCLOVIR HCL 500 MG PO TABS
1000.0000 mg | ORAL_TABLET | Freq: Every evening | ORAL | Status: DC
  Administered 2016-11-23 – 2016-11-26 (×3): 1000 mg via ORAL
  Filled 2016-11-23 (×5): qty 2

## 2016-11-23 MED ORDER — ONDANSETRON HCL 4 MG/2ML IJ SOLN: 4.0000 mg | Freq: Four times a day (QID) | INTRAMUSCULAR | Status: DC | PRN

## 2016-11-23 MED ORDER — PREDNISONE 5 MG PO TABS
5.0000 mg | ORAL_TABLET | Freq: Every day | ORAL | Status: DC
  Administered 2016-11-24 – 2016-11-27 (×4): 5 mg via ORAL
  Filled 2016-11-23 (×4): qty 1

## 2016-11-23 MED ORDER — ONDANSETRON HCL 4 MG PO TABS: 4.0000 mg | ORAL_TABLET | Freq: Four times a day (QID) | ORAL | Status: DC | PRN

## 2016-11-23 MED ORDER — MORPHINE SULFATE (PF) 2 MG/ML IV SOLN: 1.0000 mg | INTRAVENOUS | Status: DC | PRN

## 2016-11-23 NOTE — Telephone Encounter (Signed)
LMOM

## 2016-11-23 NOTE — ED Provider Notes (Signed)
[facility] Emergency Department Provider Note       Time seen: ----------------------------------------- 2:06 PM on 11/23/2016 -----------------------------------------     I have reviewed the triage vital signs and the nursing notes.   HISTORY   Chief Complaint Rectal Bleeding and Abdominal Pain    HPI Brenda Roman is a 83 y.o. female who presents to the ED for complaints of dark tarry stools that started last night and this morning. She is having some lower abdominal pain and she is currently taking Xarelto. Patient is blind, currently brought here by her husband who is at her bedside. She complains of diffuse lower abdominal pain, nothing makes it better or worse. She has never had rectal bleeding before. Husband reports she did not give her the Xarelto dose for last night.   Past Medical History:  Diagnosis Date  . Aortic stenosis   . 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 Cancer ([place])    skin  . CHF (congestive heart failure) ([place])   . Coronary artery disease   . CVA (cerebral infarction) June '16  . Hypertension   . Liver transplanted ([place])   . Macular degeneration   . Mitral valve disease   . PBC (primary biliary cirrhosis)    s/p liver transplant  . Shingles   . TIA (transient ischemic attack)     Patient Active Problem List   Diagnosis Date Noted  . Arrhythmia, ventricular 09/19/2015  . Hyponatremia 07/30/2015  . Acute CHF (congestive heart failure) ([place]) 07/18/2015  . 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 Closed wedge fracture of lumbar vertebra ([place]) 10/11/2014  . Ischemic stroke ([place]) 10/07/2014  . Acute urinary tract infection 10/07/2014  . 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 Closed fracture of humerus, surgical neck 09/29/2014  . Goals of care, counseling/discussion   . Acute encephalopathy 08/29/2014  . Hypoxia 08/29/2014  . Healthcare-associated pneumonia 08/29/2014  . Pneumonia 08/29/2014  . CVA (cerebral infarction)  08/23/2014  . Arteriosclerosis of coronary artery 03/15/2014  . Paroxysmal atrial fibrillation ([place]) 03/15/2014  . Atherosclerosis of abdominal aorta ([place]) 01/27/2014  . HLD (hyperlipidemia) 04/07/2012  . BP (high blood pressure) 04/07/2012  . History of liver transplant ([place]) 04/03/2012  . Encounter for other procedures for purposes other than remedying health state 04/03/2012  . Prolapse of urethra 03/10/2012  . Pelvic relaxation due to uterovaginal prolapse 03/10/2012    Past Surgical History:  Procedure Laterality Date  . BOWEL RESECTION    . COLONOSCOPY    . CORONARY ARTERY BYPASS GRAFT    . JOINT REPLACEMENT    . LIVER TRANSPLANT  1990    Allergies Cephalexin; Novocain [procaine]; and Sulfa antibiotics  Social History Social History  Substance Use Topics  . Smoking status: Former Research scientist (life sciences)  . Smokeless tobacco: Never Used  . Alcohol use No    Review of Systems Constitutional: Negative for fever. Eyes: Negative for vision changes ENT:  Negative for congestion, sore throat Cardiovascular: Negative for chest pain. Respiratory: Negative for shortness of breath. Gastrointestinal: Negative for vomiting and diarrhea.Positive for dark tarry stools and abdominal pain Genitourinary: Negative for dysuria. Musculoskeletal: Negative for back pain. Skin: Negative for rash. Neurological: Negative for headaches, focal weakness or numbness.  All systems negative/normal/unremarkable except as stated in the HPI  ____________________________________________   PHYSICAL EXAM:  VITAL SIGNS: ED Triage Vitals  Enc Vitals Group     BP 11/23/16 1105 (!) 140/58     Pulse Rate 11/23/16 1105 70  Resp 11/23/16 1105 16     Temp 11/23/16 1105 98.6 F (37 C)     Temp Source 11/23/16 1105 Oral     SpO2 11/23/16 1105 96 %     Weight 11/23/16 1106 115 lb (52.2 kg)     Height 11/23/16 1106 5\' 2"  (1.575 m)     Head Circumference --      Peak Flow --      Pain Score 11/23/16 1105 4      Pain Loc --      Pain Edu? --      Excl. in [place]? --     Constitutional: Alert and oriented. Well appearing and in no distress. Eyes: Patient is blind in both eyes ENT   Head: Normocephalic and atraumatic.   Nose: No congestion/rhinnorhea.   Mouth/Throat: Mucous membranes are moist.   Neck: No stridor. Cardiovascular: Normal rate, regular rhythm. No murmurs, rubs, or gallops. Respiratory: Normal respiratory effort without tachypnea nor retractions. Breath sounds are clear and equal bilaterally. No wheezes/rales/rhonchi. Gastrointestinal: Diffuse lower abdominal tenderness, no rebound or guarding. Normal bowel sounds. Musculoskeletal: Nontender with normal range of motion in extremities. No lower extremity tenderness nor edema. Neurologic:  Normal speech and language. No gross focal neurologic deficits are appreciated.  Skin:  Skin is warm, dry and intact. No rash noted. Psychiatric: Mood and affect are normal. Speech and behavior are normal.  ____________________________________________  ED COURSE:  Pertinent labs & imaging results that were available during my care of the patient were reviewed by me and considered in my medical decision making (see chart for details). Patient presents for possible rectal bleeding, we will assess with labs and imaging as indicated.   Procedures ____________________________________________   LABS (pertinent positives/negatives)  Labs Reviewed  COMPREHENSIVE METABOLIC PANEL - Abnormal; Notable for the following:       Result Value   Sodium 129 (*)    Chloride 98 (*)    Calcium 8.8 (*)    Total Protein 6.2 (*)    AST 42 (*)    GFR calc non Af Amer 58 (*)    All other components within normal limits  CBC - Abnormal; Notable for the following:    WBC 11.4 (*)    RBC 3.48 (*)    Hemoglobin 11.5 (*)    HCT 34.5 (*)    RDW 14.6 (*)    All other components within normal limits  URINALYSIS, COMPLETE (UACMP) WITH MICROSCOPIC -  Abnormal; Notable for the following:    Color, Urine AMBER (*)    APPearance TURBID (*)    Hgb urine dipstick SMALL (*)    Ketones, ur 5 (*)    Protein, ur 30 (*)    Nitrite POSITIVE (*)    Leukocytes, UA LARGE (*)    Bacteria, UA MANY (*)    All other components within normal limits  URINE CULTURE  POC OCCULT BLOOD, ED  TYPE AND SCREEN    RADIOLOGY  CT of abdomen and pelvis with contrast   IMPRESSION: 1. Acute sigmoid diverticulitis with surrounding inflammation and phlegmon but no perforation or abscess. 2. Mild right-sided hydronephrosis likely secondary to compression of the distal right ureter from the inflammatory process surrounding the sigmoid colon in the low anatomic pelvis. 3. Cardiomegaly with biatrial enlargement. 4. Aortic valve calcifications. 5. Aortic Atherosclerosis (ICD10-170.0). 6. Additional ancillary findings as above without significant interval change. ____________________________________________  FINAL ASSESSMENT AND PLAN  Diverticulitis, UTI  Plan: Patient's labs and imaging were dictated  above. Patient had presented for possible rectal bleeding and was found to have heme positive stools with findings on CT of acute diverticulitis. I have given IV Zosyn for same. She also has a UTI which should be covered by the Zosyn. I will discuss with the hospitalist for admission.   Earleen Newport, MD   Note: This note was generated in part or whole with voice recognition software. Voice recognition is usually quite accurate but there are transcription errors that can and very often do occur. I apologize for any typographical errors that were not detected and corrected.     Earleen Newport, MD 11/23/16 [phone]

## 2016-11-23 NOTE — ED Notes (Signed)
Assisted pt. To bathroom.

## 2016-11-23 NOTE — ED Notes (Signed)
Pt transport to 214 by this tech and Sunny Schlein, EDT

## 2016-11-23 NOTE — ED Triage Notes (Signed)
Pt arrives with complaints of dark tarry stools last night and this AM, states lower abd pain, pt is on xarelto, pt is blind, husband at bedside

## 2016-11-23 NOTE — H&P (Signed)
[facility] [place] NAME: Brenda Roman    MR#:  [id number]  DATE OF BIRTH:  1924/02/17  DATE OF ADMISSION:  11/23/2016  PRIMARY CARE PHYSICIAN: Kirk Ruths, MD   REQUESTING/REFERRING PHYSICIAN: Earleen Newport, MD  CHIEF COMPLAINT:   Lower abdominal pain HISTORY OF PRESENT ILLNESS:  Brenda Roman  is a 83 y.o. female with a known history of Stroke with chronic right-sided weakness, legally blind, chronic hyponatremia, history of liver transplantation for primary biliary cirrhosis and multiple other medical problems is presenting to the ED with a chief complaint of lower abdominal pain and noticed some blood in her stool. CT of the abdomen has revealed acute sigmoid diverticulitis with phlegmon but no abscess. Urine looks abnormal patient is started on broad spectrum IV antibiotics and hospitalist team is called to admit the patient. Hemoglobin is stable at 11.5. Husband at bedside  PAST MEDICAL HISTORY:   Past Medical History:  Diagnosis Date  . Aortic stenosis   . 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 Cancer ([place])    skin  . CHF (congestive heart failure) ([place])   . Coronary artery disease   . CVA (cerebral infarction) June '16  . Hypertension   . Liver transplanted ([place])   . Macular degeneration   . Mitral valve disease   . PBC (primary biliary cirrhosis)    s/p liver transplant  . Shingles   . TIA (transient ischemic attack)     PAST SURGICAL HISTOIRY:   Past Surgical History:  Procedure Laterality Date  . BOWEL RESECTION    . COLONOSCOPY    . CORONARY ARTERY BYPASS GRAFT    . JOINT REPLACEMENT    . LIVER TRANSPLANT  1990    SOCIAL HISTORY:   Social History  Substance Use Topics  . Smoking status: Former Research scientist (life sciences)  . Smokeless tobacco: Never Used  . Alcohol use No    FAMILY HISTORY:   Family History  Problem Relation Age of Onset  . Hypertension Father     DRUG ALLERGIES:   Allergies  Allergen  Reactions  . Cephalexin Other (See Comments)    Reaction:  Unknown  . Novocain [Procaine] Rash  . Sulfa Antibiotics Rash    REVIEW OF SYSTEMS:  CONSTITUTIONAL: No fever, fatigue or weakness.  EYES: No blurred or double vision. Legally blind EARS, NOSE, AND THROAT: No tinnitus or ear pain.  RESPIRATORY: No cough, shortness of breath, wheezing or hemoptysis.  CARDIOVASCULAR: No chest pain, orthopnea, edema.  GASTROINTESTINAL: No nausea, vomiting, diarrhea or abdominal pain.  GENITOURINARY: No dysuria, hematuria.  ENDOCRINE: No polyuria, nocturia,  HEMATOLOGY: No anemia, easy bruising or bleeding SKIN: No rash or lesion. MUSCULOSKELETAL: No joint pain or arthritis.   NEUROLOGIC: No tingling, numbness, reports chronic right-sided weakness PSYCHIATRY: No anxiety or depression.   MEDICATIONS AT HOME:   Prior to Admission medications   Medication Sig Start Date End Date Taking? Authorizing Provider  zaleplon (SONATA) 5 MG capsule Take 1 capsule by mouth at bedtime. 09/13/15 11/23/16 Yes [provider]  acetaminophen (TYLENOL) 325 MG tablet Take 2 tablets by mouth every 6 (six) hours as needed. Reported on 10/26/2015    [provider]  aspirin EC 81 MG tablet Take 81 mg by mouth daily.    [provider]  ciprofloxacin (CIPRO) 500 MG tablet Take 1 tablet (500 mg total) by mouth every 12 (twelve) hours. 10/19/16   Nickie Retort, MD  ezetimibe-simvastatin (VYTORIN) 10-20 MG tablet Take 1 tablet by mouth 3 (three) times a week. Patient takes on Monday, Wednesday, Friday    [provider]  lidocaine (LIDODERM) 5 % Place 1 patch onto the skin every 12 (twelve) hours. Remove & Discard patch within 12 hours or as directed by MD Patient not taking: Reported on 10/09/2016 09/16/16 09/16/17  Nance Pear, MD  methenamine (HIPREX) 1 g tablet Take 1 tablet (1 g total) by mouth 2 (two) times daily with a meal. Patient not taking: Reported on 09/16/2016 10/26/15    Zara Council A, PA-C  metoprolol succinate (TOPROL-XL) 50 MG 24 hr tablet Take 25 mg by mouth daily. 11/05/16   [provider]  metoprolol tartrate (LOPRESSOR) 25 MG tablet Take 1 tablet (25 mg total) by mouth 2 (two) times daily. Patient not taking: Reported on 11/23/2016 09/21/15   Fritzi Mandes, MD  mirtazapine (REMERON) 7.5 MG tablet Take 7.5 mg by mouth at bedtime.    [provider]  mycophenolate (CELLCEPT) 500 MG tablet Take 500 mg by mouth every 12 (twelve) hours.    [provider]  nitroGLYCERIN (NITROSTAT) 0.4 MG SL tablet Place 1 tablet under the tongue as needed. Reported on 10/26/2015 06/20/15   [provider]  predniSONE (DELTASONE) 5 MG tablet Take 5 mg by mouth daily.    [provider]  Rivaroxaban (XARELTO) 15 MG TABS tablet Take 7.5 mg by mouth daily with supper.     [provider]  senna-docusate (SENOKOT-S) 8.6-50 MG per tablet Take 1 tablet by mouth at bedtime.     [provider]  traZODone (DESYREL) 50 MG tablet Take 50 mg by mouth daily. 11/15/16   [provider]  valACYclovir (VALTREX) 1000 MG tablet Take 1,000 mg by mouth every evening. Reported on 10/26/2015    [provider]      VITAL SIGNS:  Blood pressure (!) 152/65, pulse 75, temperature 98.6 F (37 C), temperature source Oral, resp. rate 17, height 5\' 2"  (1.575 m), weight 52.2 kg (115 lb), SpO2 100 %.  PHYSICAL EXAMINATION:  GENERAL:  83 y.o.-year-old patient lying in the bed with no acute distress.  EYES: Pupils equal, round, reactive to light and accommodation. No scleral icterus. Extraocular muscles intact. Legally blind HEENT: Head atraumatic, normocephalic. Oropharynx and nasopharynx clear.  NECK:  Supple, no jugular venous distention. No thyroid enlargement, no tenderness.  LUNGS: Normal breath sounds bilaterally, no wheezing, rales,rhonchi or crepitation. No use of accessory muscles of respiration.  CARDIOVASCULAR: S1, S2  normal. No murmurs, rubs, or gallops.  ABDOMEN: Soft, nontender, nondistended. Bowel sounds present. No organomegaly or mass.  EXTREMITIES: No pedal edema, cyanosis, or clubbing.  NEUROLOGIC: Cranial nerves II through XII are intact. Right upper and lower extending motor 3-4 out of 5 , left upper and lower extending motor is at her baseline Sensation intact. Gait not checked.  PSYCHIATRIC: The patient is alert and oriented x 3.  SKIN: No obvious rash, lesion, or ulcer.   LABORATORY PANEL:   CBC  Recent Labs Lab 11/23/16 1108  WBC 11.4*  HGB 11.5*  HCT 34.5*  PLT 247   ------------------------------------------------------------------------------------------------------------------  Chemistries   Recent Labs Lab 11/23/16 1108  NA 129*  K 4.3  CL 98*  CO2 24  GLUCOSE 87  BUN 13  CREATININE 0.86  CALCIUM 8.8*  AST 42*  ALT 17  ALKPHOS 59  BILITOT 0.8   ------------------------------------------------------------------------------------------------------------------  Cardiac Enzymes No results for input(s): TROPONINI in the  last 168 hours. ------------------------------------------------------------------------------------------------------------------  RADIOLOGY:  Ct Abdomen Pelvis W Contrast  Result Date: 11/23/2016 CLINICAL DATA:  83 year old female with lower abdominal pain and dark tarry stools. EXAM: CT ABDOMEN AND PELVIS WITH CONTRAST TECHNIQUE: Multidetector CT imaging of the abdomen and pelvis was performed using the standard protocol following bolus administration of intravenous contrast. CONTRAST:  163mL ISOVUE-300 IOPAMIDOL (ISOVUE-300) INJECTION 61% COMPARISON:  Most recent prior CT scan of the abdomen and pelvis 09/20/2016 FINDINGS: Lower chest: Respiratory motion artifact limits evaluation for pulmonary nodules. Mild dependent atelectasis in the lung bases. Cardiomegaly with biatrial enlargement. CT osseous calcification of the mitral valve annulus.  Incompletely imaged aortic valve calcifications. No pericardial effusion. Hepatobiliary: Pneumobilia as previously seen consistent with prior sphincterotomy. The gallbladder is surgically absent. No discrete hepatic lesion. Stable mild dilatation of the common bile duct. Pancreas: Unremarkable. No pancreatic ductal dilatation or surrounding inflammatory changes. Spleen: Normal in size without focal abnormality. Adrenals/Urinary Tract: Circumscribed low-attenuation cystic lesions throughout both kidneys. The largest measures 3.2 cm exophytic from the upper pole of the left kidney consistent with a simple cyst. The remaining are too small for accurate characterization but are also highly likely benign cysts. No nephrolithiasis. Moderate fullness of the right renal collecting system. Stomach/Bowel: Extensive sigmoid colonic diverticulosis. There is focal submucosal edema and surrounding inflammatory changes in the low anatomic pelvis consistent with acute diverticulitis. No focal abscess or free air identified. The inflammatory process likely compresses the right ureter resulting in mild right-sided hydronephrosis. No evidence of bowel obstruction. Vascular/Lymphatic: Extensive atherosclerotic calcifications throughout the abdominal aorta. No aneurysm or dissection. Reproductive: Uterus and bilateral adnexa are unremarkable. Other: No abdominal wall hernia or abnormality. No abdominopelvic ascites. Musculoskeletal: No acute or significant osseous findings. Stable appearance of remote compression fractures involving T11, T12, L1, L2, L3 and L5. IMPRESSION: 1. Acute sigmoid diverticulitis with surrounding inflammation and phlegmon but no perforation or abscess. 2. Mild right-sided hydronephrosis likely secondary to compression of the distal right ureter from the inflammatory process surrounding the sigmoid colon in the low anatomic pelvis. 3. Cardiomegaly with biatrial enlargement. 4. Aortic valve calcifications. 5.   Aortic Atherosclerosis (ICD10-170.0). 6. Additional ancillary findings as above without significant interval change. Electronically Signed   By: Jacqulynn Cadet M.D.   On: 11/23/2016 15:48    EKG:   Orders placed or performed during the hospital encounter of 09/16/16  . ED EKG within 10 minutes  . ED EKG within 10 minutes  . EKG 12-Lead  . EKG 12-Lead    IMPRESSION AND PLAN:   Elfreda Blanchet  is a 83 y.o. female with a known history of Stroke with chronic right-sided weakness, legally blind, chronic hyponatremia, history of liver transplantation for primary biliary cirrhosis and multiple other medical problems is presenting to the ED with a chief complaint of lower abdominal pain and noticed some blood in her stool. CT of the abdomen has revealed acute sigmoid diverticulitis with phlegmon but no abscess  #Acute lower abdominal pain secondary to acute sigmoid diverticulitis Admit to MedSurg unit IV Zosyn Pain management as needed Supportive treatment, IV fluids Soft diet as tolerated Will consider surgical consult if no improvement clinically  #Acute cystitis Will get urine culture and sensitivity and de-escalate the IV antibiotics based on the sensitivity result IV fluids  #Paroxysmal atrial fibrillation  Rate controlled Patient is on Xarelto will continue the same for anticoagulation  #Chronic hyponatremia sodium at 129 Continue close monitoring of the sodium Patient is on IV fluids  #History of  liver transplantation for primary biliary cirrhosis Continue home medication CellCept and prednisone  #Patient is legally blind    All the records are reviewed and case discussed with ED provider. Management plans discussed with the patient, family and they are in agreement.  CODE STATUS: fc,Husband is the healthcare power of attorney  TOTAL TIME TAKING CARE OF THIS PATIENT: 43 minutes.   Note: This dictation was prepared with Dragon dictation along with smaller phrase  technology. Any transcriptional errors that result from this process are unintentional.  Nicholes Mango M.D on 11/23/2016 at 5:28 PM  Between 7am to 6pm - Pager - [phone]  After 6pm go to [email or web address] - password [email or web address]  [facility]  Office  [phone]  CC: Primary care physician; Kirk Ruths, MD

## 2016-11-23 NOTE — Telephone Encounter (Signed)
-----   Message from Nori Riis, PA-C sent at 11/23/2016  7:59 AM EDT ----- Please let the [facility] know that her culture only grew out 6,000 colonies.  This is not considered significant.  She needs to have an office visit if she is still having symptoms.

## 2016-11-24 LAB — COMPREHENSIVE METABOLIC PANEL
ALT: 31 U/L (ref 14–54)
ANION GAP: 5 (ref 5–15)
AST: 41 U/L (ref 15–41)
Albumin: 3 g/dL — ABNORMAL LOW (ref 3.5–5.0)
Alkaline Phosphatase: 69 U/L (ref 38–126)
BUN: 13 mg/dL (ref 6–20)
CHLORIDE: 101 mmol/L (ref 101–111)
CO2: 25 mmol/L (ref 22–32)
Calcium: 8.1 mg/dL — ABNORMAL LOW (ref 8.9–10.3)
Creatinine, Ser: 1.01 mg/dL — ABNORMAL HIGH (ref 0.44–1.00)
GFR calc non Af Amer: 48 mL/min — ABNORMAL LOW (ref >60)
GFR, EST AFRICAN AMERICAN: 55 mL/min — AB (ref >60)
Glucose, Bld: 80 mg/dL (ref 65–99)
POTASSIUM: 4 mmol/L (ref 3.5–5.1)
SODIUM: 131 mmol/L — AB (ref 135–145)
Total Bilirubin: 0.5 mg/dL (ref 0.3–1.2)
Total Protein: 5.5 g/dL — ABNORMAL LOW (ref 6.5–8.1)

## 2016-11-24 LAB — CBC
HCT: 30.8 % — ABNORMAL LOW (ref 35.0–47.0)
HEMOGLOBIN: 10.4 g/dL — AB (ref 12.0–16.0)
MCH: 33.2 pg (ref 26.0–34.0)
MCHC: 33.7 g/dL (ref 32.0–36.0)
MCV: 98.6 fL (ref 80.0–100.0)
Platelets: 224 10*3/uL (ref 150–440)
RBC: 3.12 MIL/uL — AB (ref 3.80–5.20)
RDW: 14.6 % — ABNORMAL HIGH (ref 11.5–14.5)
WBC: 8.5 10*3/uL (ref 3.6–11.0)

## 2016-11-24 LAB — MAGNESIUM: Magnesium: 1.9 mg/dL (ref 1.7–2.4)

## 2016-11-24 MED ORDER — RIVAROXABAN 15 MG PO TABS
15.0000 mg | ORAL_TABLET | Freq: Every day | ORAL | Status: DC
  Administered 2016-11-25 – 2016-11-26 (×2): 15 mg via ORAL
  Filled 2016-11-24 (×3): qty 1

## 2016-11-24 MED ORDER — SODIUM CHLORIDE 0.9 % IV BOLUS (SEPSIS)
1000.0000 mL | Freq: Once | INTRAVENOUS | Status: AC
  Administered 2016-11-24: 1000 mL via INTRAVENOUS

## 2016-11-24 MED ORDER — BISACODYL 5 MG PO TBEC
5.0000 mg | DELAYED_RELEASE_TABLET | Freq: Every day | ORAL | Status: DC | PRN
  Administered 2016-11-24: 5 mg via ORAL
  Filled 2016-11-24: qty 1

## 2016-11-24 MED ORDER — RIVAROXABAN 15 MG PO TABS: 7.5000 mg | ORAL_TABLET | Freq: Every day | ORAL | Status: DC

## 2016-11-24 MED ORDER — BISACODYL 5 MG PO TBEC
5.0000 mg | DELAYED_RELEASE_TABLET | Freq: Two times a day (BID) | ORAL | Status: DC
  Administered 2016-11-25 (×2): 5 mg via ORAL
  Filled 2016-11-24 (×2): qty 1

## 2016-11-24 NOTE — Progress Notes (Signed)
Notified Dr. Vianne Bulls of patient's hypotension.  Orders received and bolus administered.

## 2016-11-24 NOTE — Progress Notes (Signed)
Notified Dr. Vianne Bulls of run of SVT.  No new orders at this time.

## 2016-11-24 NOTE — Progress Notes (Signed)
Pt had a burst of SVT lasting less than 6 seconds. MD was notified and ensured potassium was WDL and Mag lab was ordered. Pt is on TELE.

## 2016-11-24 NOTE — Progress Notes (Signed)
Sigmoid colon diverticulitis: Improved, patient wants to go home but has hypotension, no further bleeding from stool. Hemoglobin stable. #Hypotension Not symptomatic. He initially refused IV fluids but after discussing with her she is admitted for IV hydration, possible discharge later today if her pressure improves to at least 100/70. And hold the BP medicines at home, discussed this with patient. And her husband #2 hyponatremia: Improved today #3 sigmoid colon diverticulitis with the lower abdominal pain, rectal bleed: Symptoms improved, tolerating the diet, hemoglobin stable. #4 history of shingles, legal blindness History of liver transplant due to [place] ;she  is on immunosuppressants. #5 history of CVA: Patient is on aspirin, Xarelto. Time spent;35 min

## 2016-11-24 NOTE — Progress Notes (Signed)
Patient refused NS bolus.  States she would like to get up to the chair and recheck blood pressure.  Also states she will ambulate with husband only, per normal routine at home.  Reviewed fall risk with patient and husband understanding was verbalized.

## 2016-11-25 ENCOUNTER — Other Ambulatory Visit: Payer: Self-pay | Admitting: Internal Medicine

## 2016-11-25 ENCOUNTER — Inpatient Hospital Stay: Payer: Medicare Other

## 2016-11-25 LAB — URINE CULTURE
Culture: >=100000 — AB
Special Requests: NORMAL

## 2016-11-25 MED ORDER — DEXTROSE 5 % IV SOLN
1.0000 g | INTRAVENOUS | Status: DC
  Administered 2016-11-25 – 2016-11-27 (×3): 1 g via INTRAVENOUS
  Filled 2016-11-25 (×3): qty 10

## 2016-11-25 NOTE — Progress Notes (Signed)
0600- I Walked into patient's room after hearing her bed alarm going off. I walked into the room and asked the patient what she needed. Pt was seated on the end of the bed and reached across to the counter and proceeded to stand. She stated "I have to pee!". I told her "hold on for a second." Pt (quickly) proceeded to stand. I stood beside her and noted the bedside commode was a couple of feet away by the wall and guided pt towards it. Pt proceeded to (very quickly) pull her pants down and squat. I told her again to "hold on". I, while still standing beside her with my arm around her, pulled the bedside commode directly behind her so she could sit down. Pt then took another step over and tripped over the leg of the bedside commode striking the wall and sliding down to the floor. Help called. Pt stated "I hit my head". Pt was assessed. Skin WDL. No hematomas noted. Could move all extremities without pain or difficulty. Pt stated she had no pain. With the assistance of another staff member pt was seated to bedside commode.

## 2016-11-25 NOTE — Plan of Care (Signed)
Patient's husband requesting information surrounding patient's fall and to speak with supervisor.  AC notified and stated will round on patient when able.

## 2016-11-25 NOTE — Progress Notes (Signed)
[facility] at [place] NAME: Brenda Roman    MR#:  [id number]  DATE OF BIRTH:  [birth year]  SUBJECTIVE: Admitted for rectal bleed. Hemoglobin is stable found to have sigmoid diverticulitis. Patient has abdominal pain, no further rectal bleed since admission. And tolerating the diet.. Patient had a fall last night and now complaining of right hip pain, right wrist pain. She was hypotensive yesterday but blood pressure is better today. Episode of SVT yesterday but she was not symptomatic.   CHIEF COMPLAINT:   Chief Complaint  Patient presents with  . Rectal Bleeding  . Abdominal Pain    REVIEW OF SYSTEMS:    Review of Systems  Constitutional: Negative for chills and fever.  HENT: Positive for hearing loss.   Eyes: Negative for blurred vision, double vision and photophobia.  Respiratory: Negative for cough, hemoptysis and shortness of breath.   Cardiovascular: Negative for palpitations, orthopnea and leg swelling.  Gastrointestinal: Negative for abdominal pain, diarrhea and vomiting.  Genitourinary: Negative for dysuria and urgency.  Musculoskeletal: Positive for falls and joint pain. Negative for myalgias and neck pain.  Skin: Negative for rash.  Neurological: Negative for dizziness, focal weakness, seizures, weakness and headaches.  Endo/Heme/Allergies: Bruises/bleeds easily.  Psychiatric/Behavioral: Negative for memory loss. The patient does not have insomnia.     Nutrition:  Tolerating Diet: Tolerating PT:      DRUG ALLERGIES:   Allergies  Allergen Reactions  . Cephalexin Other (See Comments)    Reaction:  Unknown  . Novocain [Procaine] Rash  . Sulfa Antibiotics Rash    VITALS:  Blood pressure (!) 146/73, pulse 92, temperature 98.1 F (36.7 C), temperature source Oral, resp. rate 16, height 5\' 2"  (1.575 m), weight 47.9 kg (105 lb 11.2 oz), SpO2 97 %.  PHYSICAL EXAMINATION:   Physical Exam  GENERAL:  83  y.o.-year-old patient lying in the bed with no acute distress.  EYES: Pupils equal, round, reactive to light and accommodation. No scleral icterus. Extraocular muscles intact.  HEENT: Head atraumatic, normocephalic. Oropharynx and nasopharynx clear.  NECK:  Supple, no jugular venous distention. No thyroid enlargement, no tenderness.  LUNGS: Normal breath sounds bilaterally, no wheezing, rales,rhonchi or crepitation. No use of accessory muscles of respiration.  CARDIOVASCULAR: S1, S2 normal. No murmurs, rubs, or gallops.  ABDOMEN: Soft, nontender, nondistended. Bowel sounds present. No organomegaly or mass.  EXTREMITIES: No pedal edema, cyanosis, or clubbing.  NEUROLOGIC: Cranial nerves II through XII are intact. Muscle strength 5/5 in all extremities. Sensation intact. Gait not checked.  PSYCHIATRIC: The patient is alert and oriented x 3.  SKIN: No obvious rash, lesion, or ulcer.  Complaints of right hip pain, right wrist pain. No deformity in the right hip. Mainly complains of pain in the muscle on the right hip, right wrist also hurts with movement and she is tearful.  LABORATORY PANEL:   CBC  Recent Labs Lab 11/24/16 0434  WBC 8.5  HGB 10.4*  HCT 30.8*  PLT 224   ------------------------------------------------------------------------------------------------------------------  Chemistries   Recent Labs Lab 11/24/16 0434  NA 131*  K 4.0  CL 101  CO2 25  GLUCOSE 80  BUN 13  CREATININE 1.01*  CALCIUM 8.1*  MG 1.9  AST 41  ALT 31  ALKPHOS 69  BILITOT 0.5   ------------------------------------------------------------------------------------------------------------------  Cardiac Enzymes No results for input(s): TROPONINI in the last 168 hours. ------------------------------------------------------------------------------------------------------------------  RADIOLOGY:  Ct Abdomen Pelvis W Contrast  Result Date: 11/23/2016 CLINICAL DATA:  83 year old  female with  lower abdominal pain and dark tarry stools. EXAM: CT ABDOMEN AND PELVIS WITH CONTRAST TECHNIQUE: Multidetector CT imaging of the abdomen and pelvis was performed using the standard protocol following bolus administration of intravenous contrast. CONTRAST:  152mL ISOVUE-300 IOPAMIDOL (ISOVUE-300) INJECTION 61% COMPARISON:  Most recent prior CT scan of the abdomen and pelvis 09/20/2016 FINDINGS: Lower chest: Respiratory motion artifact limits evaluation for pulmonary nodules. Mild dependent atelectasis in the lung bases. Cardiomegaly with biatrial enlargement. CT osseous calcification of the mitral valve annulus. Incompletely imaged aortic valve calcifications. No pericardial effusion. Hepatobiliary: Pneumobilia as previously seen consistent with prior sphincterotomy. The gallbladder is surgically absent. No discrete hepatic lesion. Stable mild dilatation of the common bile duct. Pancreas: Unremarkable. No pancreatic ductal dilatation or surrounding inflammatory changes. Spleen: Normal in size without focal abnormality. Adrenals/Urinary Tract: Circumscribed low-attenuation cystic lesions throughout both kidneys. The largest measures 3.2 cm exophytic from the upper pole of the left kidney consistent with a simple cyst. The remaining are too small for accurate characterization but are also highly likely benign cysts. No nephrolithiasis. Moderate fullness of the right renal collecting system. Stomach/Bowel: Extensive sigmoid colonic diverticulosis. There is focal submucosal edema and surrounding inflammatory changes in the low anatomic pelvis consistent with acute diverticulitis. No focal abscess or free air identified. The inflammatory process likely compresses the right ureter resulting in mild right-sided hydronephrosis. No evidence of bowel obstruction. Vascular/Lymphatic: Extensive atherosclerotic calcifications throughout the abdominal aorta. No aneurysm or dissection. Reproductive: Uterus and bilateral adnexa are  unremarkable. Other: No abdominal wall hernia or abnormality. No abdominopelvic ascites. Musculoskeletal: No acute or significant osseous findings. Stable appearance of remote compression fractures involving T11, T12, L1, L2, L3 and L5. IMPRESSION: 1. Acute sigmoid diverticulitis with surrounding inflammation and phlegmon but no perforation or abscess. 2. Mild right-sided hydronephrosis likely secondary to compression of the distal right ureter from the inflammatory process surrounding the sigmoid colon in the low anatomic pelvis. 3. Cardiomegaly with biatrial enlargement. 4. Aortic valve calcifications. 5.  Aortic Atherosclerosis (ICD10-170.0). 6. Additional ancillary findings as above without significant interval change. Electronically Signed   By: Jacqulynn Cadet M.D.   On: 11/23/2016 15:48     ASSESSMENT AND PLAN:   Active Problems:   Acute diverticulitis  1.Rectal bleed due to sigmoid colon diverticulitis: Improving, hemoglobin stable, no further rectal bed since admission. Continue conservative treatment. #2. Escherichia coli UTI. Patient started on Rocephin, possible discharge with Keflex when she is ready.  3.Fall last night: Has right hip pain, right wrist pain: Check x-ray of the right hip, right wrist x-ray today.  #4. Hypotension yesterday: Improved. Start  Back metoprolol. Tartrate instead of Toprol-XL.  All the records are reviewed and case discussed with Care Management/Social Workerr. Management plans discussed with the patient, family and they are in agreement.  CODE STATUS: full  TOTAL TIME TAKING CARE OF THIS PATIENT: 35 minutes.   POSSIBLE D/C IN 1-2 DAYS, DEPENDING ON CLINICAL CONDITION.   Epifanio Lesches M.D on 11/25/2016 at 9:49 AM  Between 7am to 6pm - Pager - [phone]  After 6pm go to [email or web address] - password [email or web address] [facility]  [facility]  Office  [phone]  CC: Primary care physician; Kirk Ruths, MD

## 2016-11-25 NOTE — Progress Notes (Signed)
Approx. 06:00 I was called from another Pt room and informed that Pt had a fall. When I got to room the Pt was sitting on the [facility] with other staff members around her. Nurse was doing an assessment and neuro check at the time. Pt noted that she was fine but that she did hit her head as she pressed on the area that she had hit she noted that it was not hurting but she knew that she had hit it. Pt also noted that she did hit her right leg and arm but that they did not hurt any worse than they would normally do from time to time. Pt was able to give a complete account of what took place and answer assessment questions. Pt was escorted back to the bed. MD was notified and questioned Pt"s condition, he was informed that the Pt had no broken skin, bleeding, hematomas that could be seen at that time, the Pt was alert and oriented with no confusion, and neuro checks were WDL. MD acknowledge and noted based on the condition described that the Pt did not need any CT scans. Pt family was called and informed of the situation.

## 2016-11-25 NOTE — Progress Notes (Signed)
Herby Abraham, RN called and stated patient's husband had returned and was at the bedside. Upon arrival, patient resting in bed in no acute distress. Husband stated he would like to get her medical records regarding fall. This nurse explained the process of obtaining medical records and that the medical records department is closed on the weekend and that they will reopen at 8am tomorrow morning. Husband verbalized understanding. Informed him that a fall report had also been done by the night shift RN, and it was placed in the Director's office. Gave him Director's name and stated she would follow up tomorrow.

## 2016-11-25 NOTE — Progress Notes (Signed)
Went to room 214 as requested to speak with husband and patient.Husband had gone home for awhile and staff were in the process of toileting the patient. I asked the Shift Coordinator to call me when husband returns.

## 2016-11-25 NOTE — Progress Notes (Signed)
Pharmacy Note  Pt with UTI ordered Cipro consult. Possible drug interaction between cipro and Cellcept (may decrease Cellcept level). Pt with cephalexin allergy in chart, unknown reaction. Spoke with pt's husband via phone - he has never heard of this medication, denies allergy; confirmed sulfa and novocain allergies. Spoke with pt's RN (also with previous shift RN), no report passed on about allergic reactions this admission. Pt received ceftriaxone 1 g x1 on 8/3 as well as Zosyn x1 8/3. Spoke with hospitalist, will change antibiotic to ceftriaxone for now and monitor pt.

## 2016-11-25 NOTE — Plan of Care (Signed)
Patient states her Rt leg is hurting after the fall.  Dr. text to inform.

## 2016-11-26 MED ORDER — BISACODYL 5 MG PO TBEC
5.0000 mg | DELAYED_RELEASE_TABLET | Freq: Every day | ORAL | Status: DC | PRN
  Administered 2016-11-26: 5 mg via ORAL
  Filled 2016-11-26: qty 1

## 2016-11-26 MED ORDER — SENNOSIDES-DOCUSATE SODIUM 8.6-50 MG PO TABS
2.0000 | ORAL_TABLET | Freq: Two times a day (BID) | ORAL | Status: DC
  Administered 2016-11-26 – 2016-11-27 (×3): 2 via ORAL
  Filled 2016-11-26 (×3): qty 2

## 2016-11-26 NOTE — Care Management Important Message (Signed)
Important Message  Patient Details  Name: Brenda Roman MRN: [id number] Date of Birth: April 04, 1924   Medicare Important Message Given:  Yes    Beverly Sessions, RN 11/26/2016, 2:38 PM

## 2016-11-26 NOTE — Progress Notes (Signed)
[facility] [place] NAME: Brenda Roman    MR#:  [id number]  DATE OF BIRTH:  [birth year]  SUBJECTIVE: Admitted for rectal bleed. Hemoglobin is stable found to have sigmoid diverticulitis. Patient has abdominal pain, no further rectal bleed since admission. And tolerating the diet.. Patient had a fall last night and now complaining of right hip pain, right wrist pain. She was hypotensive yesterday but blood pressure is better today. Episode of SVT yesterday but she was not symptomatic.   CHIEF COMPLAINT:   Chief Complaint  Patient presents with  . Rectal Bleeding  . Abdominal Pain  Feels fine, was constipated but likely had small bowel movement.  As per her daughter who is at the bedside.  Patient is very pleasant and talkative REVIEW OF SYSTEMS:    Review of Systems  Constitutional: Negative for chills and fever.  HENT: Positive for hearing loss.   Eyes: Negative for blurred vision, double vision and photophobia.  Respiratory: Negative for cough, hemoptysis and shortness of breath.   Cardiovascular: Negative for palpitations, orthopnea and leg swelling.  Gastrointestinal: Negative for abdominal pain, diarrhea and vomiting.  Genitourinary: Negative for dysuria and urgency.  Musculoskeletal: Positive for falls and joint pain. Negative for myalgias and neck pain.  Skin: Negative for rash.  Neurological: Negative for dizziness, focal weakness, seizures, weakness and headaches.  Endo/Heme/Allergies: Bruises/bleeds easily.  Psychiatric/Behavioral: Negative for memory loss. The patient does not have insomnia.     Tolerating Diet:Yes Tolerating PT: Rehabilitation DRUG ALLERGIES:   Allergies  Allergen Reactions  . Cephalexin Other (See Comments)    Reaction:  Unknown  . Novocain [Procaine] Rash  . Sulfa Antibiotics Rash    VITALS:  Blood pressure 112/60, pulse 70, temperature (!) 97.3 F (36.3 C), temperature source Oral, resp.  rate 16, height 5\' 2"  (1.575 m), weight 47.9 kg (105 lb 11.2 oz), SpO2 95 %.  PHYSICAL EXAMINATION:   Physical Exam  GENERAL:  83 y.o.-year-old patient lying in the bed with no acute distress.  EYES: Pupils equal, round, reactive to light and accommodation. No scleral icterus. Extraocular muscles intact.  HEENT: Head atraumatic, normocephalic. Oropharynx and nasopharynx clear.  NECK:  Supple, no jugular venous distention. No thyroid enlargement, no tenderness.  LUNGS: Normal breath sounds bilaterally, no wheezing, rales,rhonchi or crepitation. No use of accessory muscles of respiration.  CARDIOVASCULAR: S1, S2 normal. No murmurs, rubs, or gallops.  ABDOMEN: Soft, nontender, nondistended. Bowel sounds present. No organomegaly or mass.  EXTREMITIES: No pedal edema, cyanosis, or clubbing.  NEUROLOGIC: Cranial nerves II through XII are intact. Muscle strength 5/5 in all extremities. Sensation intact. Gait not checked.  PSYCHIATRIC: The patient is alert and oriented x 3.  SKIN: No obvious rash, lesion, or ulcer.  Complaints of right hip pain, right wrist pain. No deformity in the right hip. Mainly complains of pain in the muscle on the right hip, right wrist also hurts with movement  LABORATORY PANEL:   CBC  Recent Labs Lab 11/24/16 0434  WBC 8.5  HGB 10.4*  HCT 30.8*  PLT 224   ------------------------------------------------------------------------------------------------------------------  Chemistries   Recent Labs Lab 11/24/16 0434  NA 131*  K 4.0  CL 101  CO2 25  GLUCOSE 80  BUN 13  CREATININE 1.01*  CALCIUM 8.1*  MG 1.9  AST 41  ALT 31  ALKPHOS 69  BILITOT 0.5   ------------------------------------------------------------------------------------------------------------------  Cardiac Enzymes No results for input(s): TROPONINI in the last 168  hours. ------------------------------------------------------------------------------------------------------------------  RADIOLOGY:  Dg Wrist 2 Views Right  Result Date: 11/25/2016 CLINICAL DATA:  Fall yesterday. Right wrist pain. Initial encounter. EXAM: RIGHT WRIST - 2 VIEW COMPARISON:  None. FINDINGS: There is no evidence of acute fracture or dislocation. Diffuse osteopenia. Moderate osteoarthritis of the STT joint complex. Mild widening of scapholunate space. Mild radiocarpal joint space narrowing . IMPRESSION: No acute findings. Electronically Signed   By: Earle Gell M.D.   On: 11/25/2016 11:22   Dg Hip Unilat With Pelvis 2-3 Views Right  Result Date: 11/25/2016 CLINICAL DATA:  Fall yesterday.  Right hip pain and tenderness. EXAM: DG HIP (WITH OR WITHOUT PELVIS) 2-3V RIGHT COMPARISON:  None. FINDINGS: Interlocking screw and intramedullary rod seen in the right hip. Old intertrochanteric fracture deformity. No evidence of acute hip fracture or dislocation. Mild right hip osteoarthritis . Generalized osteopenia noted. Old fracture deformity also seen right superior and inferior pubic rami. IMPRESSION: No acute findings. Electronically Signed   By: Earle Gell M.D.   On: 11/25/2016 11:24     ASSESSMENT AND PLAN:   Active Problems:   Acute diverticulitis  1.Rectal bleed due to sigmoid colon diverticulitis: Improving, hemoglobin stable, no further rectal bed since admission. Continue conservative treatment.  #2. Escherichia coli UTI. continue Rocephin, possible discharge with Keflex tomorrow.  3.Fall on 8/4 in the Hospital: Has right hip pain, right wrist pain: x-ray of the right hip, right wrist neg for any acute fracture  #4. Hypotension: Improved. continue Toprol-XL.  5. Weakness: Physical therapy recommends rehabilitation where she will be released tomorrow if bed is available at [facility].     All the records are reviewed and case discussed with Care Management/Social  Worker. Management plans discussed with the patient, family and they are in agreement.  CODE STATUS: full  TOTAL TIME TAKING CARE OF THIS PATIENT: 35 minutes.   POSSIBLE D/C IN 1 DAYS, DEPENDING ON CLINICAL CONDITION.   Max Sane M.D on 11/26/2016 at 2:55 PM  Between 7am to 6pm - Pager - [phone]  After 6pm go to [email or web address] - password [email or web address] [facility]  [facility]  Office  [phone]  CC: Primary care physician; Kirk Ruths, MD

## 2016-11-26 NOTE — Evaluation (Signed)
Physical Therapy Evaluation Patient Details Name: Brenda Roman MRN: [id number] DOB: Mar 30, 1924 Today's Date: 11/26/2016   History of Present Illness  Pt is a 83 y.o.femalewith a known history of Stroke with chronic right-sided weakness, legally blind, chronic hyponatremia, history of liver transplantation for primary biliary cirrhosis and multiple other medical problems is presenting to the ED with a chief complaint of lower abdominal pain and noticed some blood in her stool. CT of the abdomen has revealed acute sigmoid diverticulitis with phlegmon but no abscess.Urine looks abnormal patient is started on broad spectrum IV antibiotics and hospitalist team is called to admit the patient.  Of note, pt had a fall with hospital staff 11/24/16 with imaging taken revealing no acute findings.  Assessment includes: Rectal bleed due to sigmoid colon diverticulitis, fall with R hip and wrist pain, and hypotension.     Clinical Impression  Pt presents with deficits in strength, transfers, mobility, gait, balance, and activity tolerance.  Pt required Min A with extra time/effort during bed mobility tasks.  Pt required Min A with sit to/from stand transfers to both come to standing and to prevent LOB upon initial stand.  Pt required Mod A for stability during amb 3-4' with HHA with antalgic gait on RLE.  Pt will benefit from PT services in a SNF setting upon discharge to address above deficits for decreased caregiver assistance and safe return to PLOF.      Follow Up Recommendations SNF    Equipment Recommendations  None recommended by PT    Recommendations for Other Services       Precautions / Restrictions Precautions Precautions: Fall Restrictions Weight Bearing Restrictions: No      Mobility  Bed Mobility Overal bed mobility: Needs Assistance Bed Mobility: Supine to Sit;Sit to Supine     Supine to sit: Min assist Sit to supine: Min assist      Transfers Overall transfer level: Needs  assistance Equipment used: 1 person hand held assist Transfers: Sit to/from Stand Sit to Stand: Min assist            Ambulation/Gait Ambulation/Gait assistance: Mod assist Ambulation Distance (Feet): 4 Feet Assistive device: 1 person hand held assist Gait Pattern/deviations: Step-to pattern;Antalgic   Gait velocity interpretation: <1.8 ft/sec, indicative of risk for recurrent falls General Gait Details: Mod A for stability during limited amb with antalgic gait on RLE.  Stairs            Wheelchair Mobility    Modified Rankin (Stroke Patients Only)       Balance Overall balance assessment: Needs assistance Sitting-balance support: Feet supported;Bilateral upper extremity supported Sitting balance-Leahy Scale: Fair     Standing balance support: Single extremity supported Standing balance-Leahy Scale: Poor Standing balance comment: Mod A in standing to prevent LOB                             Pertinent Vitals/Pain Pain Assessment: No/denies pain    Home Living Family/patient expects to be discharged to:: Private residence Living Arrangements: Spouse/significant other Available Help at Discharge: Family;Available 24 hours/day;Personal care attendant Type of Home: House Home Access: Ramped entrance     Home Layout: One level Home Equipment: [place] - 2 wheels;Bedside commode;Hospital bed;Wheelchair - manual      Prior Function Level of Independence: Needs assistance   Gait / Transfers Assistance Needed: Pt able to ambulate with HHA household distances, w/c for community access  ADL's / Homemaking Assistance Needed: Assist  from caregiver/spouse with ADLs        Hand Dominance   Dominant Hand: Right    Extremity/Trunk Assessment   Upper Extremity Assessment Upper Extremity Assessment: Generalized weakness    Lower Extremity Assessment Lower Extremity Assessment: Generalized weakness       Communication   Communication: HOH   Cognition Arousal/Alertness: Awake/alert Behavior During Therapy: WFL for tasks assessed/performed Overall Cognitive Status: Within Functional Limits for tasks assessed                                        General Comments      Exercises Total Joint Exercises Ankle Circles/Pumps: AROM;Both;5 reps;10 reps Quad Sets: Strengthening;Both;10 reps;5 reps Gluteal Sets: Strengthening;5 reps;10 reps Hip ABduction/ADduction: AAROM;Both;5 reps Straight Leg Raises: AAROM;Both;5 reps Long Arc Quad: AROM;Both;10 reps Knee Flexion: AROM;Both;10 reps   Assessment/Plan    PT Assessment Patient needs continued PT services  PT Problem List Decreased strength;Decreased activity tolerance;Decreased balance;Decreased mobility       PT Treatment Interventions Gait training;Functional mobility training;Neuromuscular re-education;Balance training;Therapeutic exercise;Therapeutic activities;Patient/family education    PT Goals (Current goals can be found in the Care Plan section)  Acute Rehab PT Goals Patient Stated Goal: To walk better PT Goal Formulation: With patient Time For Goal Achievement: 12/09/16 Potential to Achieve Goals: Good    Frequency Min 2X/week   Barriers to discharge        Co-evaluation               AM-PAC PT "6 Clicks" Daily Activity  Outcome Measure Difficulty turning over in bed (including adjusting bedclothes, sheets and blankets)?: Total Difficulty moving from lying on back to sitting on the side of the bed? : Total Difficulty sitting down on and standing up from a chair with arms (e.g., wheelchair, bedside commode, etc,.)?: Total Help needed moving to and from a bed to chair (including a wheelchair)?: A Lot Help needed walking in hospital room?: Total Help needed climbing 3-5 steps with a railing? : Total 6 Click Score: 7    End of Session Equipment Utilized During Treatment: Gait belt Activity Tolerance: Patient limited by  pain Patient left: in bed;with bed alarm set;with family/visitor present;with call bell/phone within reach Nurse Communication: Mobility status PT Visit Diagnosis: Difficulty in walking, not elsewhere classified (R26.2);Muscle weakness (generalized) (M62.81)    Time: 2703-5009 PT Time Calculation (min) (ACUTE ONLY): 28 min   Charges:   PT Evaluation $PT Eval Low Complexity: 1 Low PT Treatments $Therapeutic Exercise: 8-22 mins   PT G Codes:        DRoyetta Asal PT, DPT 11/26/16, 2:21 PM

## 2016-11-26 NOTE — NC FL2 (Signed)
[place] LEVEL OF CARE SCREENING TOOL     IDENTIFICATION  Patient Name: Brenda Roman Birthdate: [birth year] Sex: female Admission Date (Current Location): 11/23/2016  [place] and [place] Number:  Engineering geologist and Address:  [facility], [address], [place], [place] [address]      Provider Number: [id number]  Attending Physician Name and Address:  Max Sane, MD  Relative Name and Phone Number:       Current Level of Care: Hospital Recommended Level of Care: [place] Prior Approval Number:    Date Approved/Denied:   PASRR Number:   [id number]   Discharge Plan: SNF    Current Diagnoses: Patient Active Problem List   Diagnosis Date Noted  . Acute diverticulitis 11/23/2016  . Arrhythmia, ventricular 09/19/2015  . Hyponatremia 07/30/2015  . Acute CHF (congestive heart failure) ([place]) 07/18/2015  . 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 Closed wedge fracture of lumbar vertebra ([place]) 10/11/2014  . Ischemic stroke ([place]) 10/07/2014  . Acute urinary tract infection 10/07/2014  . 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 Closed fracture of humerus, surgical neck 09/29/2014  . Goals of care, counseling/discussion   . Acute encephalopathy 08/29/2014  . Hypoxia 08/29/2014  . Healthcare-associated pneumonia 08/29/2014  . Pneumonia 08/29/2014  . CVA (cerebral infarction) 08/23/2014  . Arteriosclerosis of coronary artery 03/15/2014  . Paroxysmal atrial fibrillation ([place]) 03/15/2014  . Atherosclerosis of abdominal aorta ([place]) 01/27/2014  . HLD (hyperlipidemia) 04/07/2012  . BP (high blood pressure) 04/07/2012  . History of liver transplant ([place]) 04/03/2012  . Encounter for other procedures for purposes other than remedying health state 04/03/2012  . Prolapse of urethra 03/10/2012  . Pelvic relaxation due to uterovaginal prolapse 03/10/2012    Orientation RESPIRATION BLADDER Height & Weight     Self, Place,  Time, Situation  Normal Continent Weight: 105 lb 11.2 oz (47.9 kg) Height:  5\' 2"  (157.5 cm)  BEHAVIORAL SYMPTOMS/MOOD NEUROLOGICAL BOWEL NUTRITION STATUS      Continent Diet (regular)  AMBULATORY STATUS COMMUNICATION OF NEEDS Skin   Extensive Assist Verbally Bruising                       Personal Care Assistance Level of Assistance  Bathing, Feeding, Dressing Bathing Assistance: Limited assistance Feeding assistance: Limited assistance Dressing Assistance: Limited assistance     Functional Limitations Info  Sight, Hearing, Speech Sight Info: Impaired (blind in both eyes) Hearing Info: Impaired (HOH) Speech Info: Adequate    SPECIAL CARE FACTORS FREQUENCY  PT (By licensed PT), OT (By licensed OT)     PT Frequency: 5x a week OT Frequency: 5x/a week            Contractures Contractures Info: Not present    Additional Factors Info  Code Status, Allergies Code Status Info: Full Code Allergies Info: Cephalexin, Novocain Procaine, Sulfa Antibiotics           Current Medications (11/26/2016):  This is the current hospital active medication list Current Facility-Administered Medications  Medication Dose Route Frequency Provider Last Rate Last Dose  . acetaminophen (TYLENOL) tablet 325 mg  325 mg Oral Once Earleen Newport, MD   Stopped at 11/23/16 1621  . acetaminophen (TYLENOL) tablet 650 mg  650 mg Oral Q6H PRN Nicholes Mango, MD   650 mg at 11/25/16 2320  . aspirin EC tablet 81 mg  81 mg Oral Daily Gouru, Aruna, MD   81 mg at 11/26/16 0933  .  bisacodyl (DULCOLAX) EC tablet 5 mg  5 mg Oral Daily PRN Max Sane, MD   5 mg at 11/26/16 0807  . cefTRIAXone (ROCEPHIN) 1 g in dextrose 5 % 50 mL IVPB  1 g Intravenous Q24H Epifanio Lesches, MD   Stopped at 11/26/16 1004  . ezetimibe (ZETIA) tablet 10 mg  10 mg Oral Q M,W,F Gouru, Aruna, MD   10 mg at 11/26/16 0933   And  . simvastatin (ZOCOR) tablet 20 mg  20 mg Oral Q M,W,F Gouru, Aruna, MD   20 mg at 11/26/16 0933   . lidocaine (LIDODERM) 5 % 1 patch  1 patch Transdermal Q12H Gouru, Aruna, MD      . metoprolol succinate (TOPROL-XL) 24 hr tablet 25 mg  25 mg Oral Daily Gouru, Aruna, MD   25 mg at 11/26/16 0934  . mirtazapine (REMERON) tablet 7.5 mg  7.5 mg Oral QHS Gouru, Aruna, MD   7.5 mg at 11/25/16 2237  . mycophenolate (CELLCEPT) capsule 500 mg  500 mg Oral Q12H Gouru, Aruna, MD   500 mg at 11/26/16 0933  . nitroGLYCERIN (NITROSTAT) SL tablet 0.4 mg  0.4 mg Sublingual Q5 min PRN Gouru, Aruna, MD      . ondansetron (ZOFRAN) tablet 4 mg  4 mg Oral Q6H PRN Gouru, Aruna, MD       Or  . ondansetron (ZOFRAN) injection 4 mg  4 mg Intravenous Q6H PRN Gouru, Aruna, MD      . oxyCODONE (Oxy IR/ROXICODONE) immediate release tablet 5 mg  5 mg Oral Q4H PRN Gouru, Aruna, MD      . predniSONE (DELTASONE) tablet 5 mg  5 mg Oral Daily Gouru, Aruna, MD   5 mg at 11/26/16 0933  . Rivaroxaban (XARELTO) tablet 15 mg  15 mg Oral Q supper Epifanio Lesches, MD   15 mg at 11/25/16 1715  . senna-docusate (Senokot-S) tablet 2 tablet  2 tablet Oral BID Max Sane, MD   2 tablet at 11/26/16 0807  . traZODone (DESYREL) tablet 50 mg  50 mg Oral Daily Gouru, Aruna, MD   50 mg at 11/25/16 2320  . valACYclovir (VALTREX) tablet 1,000 mg  1,000 mg Oral QPM Gouru, Aruna, MD   1,000 mg at 11/25/16 1800  . zolpidem (AMBIEN) tablet 5 mg  5 mg Oral QHS PRN Nicholes Mango, MD         Discharge Medications: Please see discharge summary for a list of discharge medications.  Relevant Imaging Results:  Relevant Lab Results:   Additional Information SSN:  [id number]      [place], Ava

## 2016-11-26 NOTE — Telephone Encounter (Signed)
Pt husband returned call stating pt was in the ER at the time of call. Husband stated that pt was admitted for rectal bleeding. Husband then stated that ER found pt to have a UTI of E.coli. Nurse checked chart and noted that to be correct. Reinforced with husband that hospital was treating UTI and no need for further f/u with [facility] at this time. Reinforced with husband once pt is home if problems develop to call. Pt husband voiced understanding of whole conversation.

## 2016-11-26 NOTE — Evaluation (Signed)
Occupational Therapy Evaluation Patient Details Name: Brenda Roman MRN: [id number] DOB: July 15, 1923 Today's Date: 11/26/2016    History of Present Illness Pt is a 83 y.o.femalewith a known history of Stroke with chronic right-sided weakness, legally blind, chronic hyponatremia, history of liver transplantation for primary biliary cirrhosis and multiple other medical problems is presenting to the ED with a chief complaint of lower abdominal pain and noticed some blood in her stool. CT of the abdomen has revealed acute sigmoid diverticulitis with phlegmon but no abscess.Urine looks abnormal patient is started on broad spectrum IV antibiotics and hospitalist team is called to admit the patient.  Of note, pt had a fall with hospital staff 11/24/16 with imaging taken revealing no acute findings.  Assessment includes: Rectal bleed due to sigmoid colon diverticulitis, fall with R hip and wrist pain, and hypotension.    Clinical Impression   Pt seen for OT evaluation this date. Pt presents with deficits in strength, functional mobility, self care tasks, balance, and activity tolerance. Pt will benefit from skilled OT Services to address noted impairments and functional deficits in order to return to PLOF and minimize risk of future falls/injury/rehospitalization and increased need for caregiver burden.     Follow Up Recommendations  SNF    Equipment Recommendations  None recommended by OT    Recommendations for Other Services       Precautions / Restrictions Precautions Precautions: Fall Restrictions Weight Bearing Restrictions: No      Mobility Bed Mobility Overal bed mobility: Needs Assistance Bed Mobility: Supine to Sit;Sit to Supine     Supine to sit: Min assist;HOB elevated Sit to supine: Min assist   General bed mobility comments: min assist for scooting EOB, use of bed rails  Transfers Overall transfer level: Needs assistance Equipment used: 1 person hand held  assist Transfers: Sit to/from Stand Sit to Stand: Min guard         General transfer comment: pt able to perform sit to stand from EOB with handheld assist and min guard with no LOB, but slight posterior lean noted initially, pt able to correct with VC    Balance Overall balance assessment: Needs assistance Sitting-balance support: Feet supported;Bilateral upper extremity supported Sitting balance-Leahy Scale: Fair     Standing balance support: Single extremity supported Standing balance-Leahy Scale: Fair Standing balance comment: min assist to prevent LOB/slight posterior lean and VC                           ADL either performed or assessed with clinical judgement   ADL Overall ADL's : Needs assistance/impaired Eating/Feeding: Bed level;Set up   Grooming: Bed level;Set up   Upper Body Bathing: Minimal assistance;Bed level   Lower Body Bathing: Maximal assistance;Bed level   Upper Body Dressing : Minimal assistance;Bed level   Lower Body Dressing: Moderate assistance;Bed level   Toilet Transfer: Minimal assistance;Stand-pivot;BSC;With caregiver independent Cabin crew Details (indicate cue type and reason): PCA able to perform with pt independently                 Vision Baseline Vision/History: Legally blind Patient Visual Report: No change from baseline       Perception     Praxis      Pertinent Vitals/Pain Pain Assessment: No/denies pain (0/10 pain with no movement, increasing to 3-4 with mobility)     Hand Dominance Right   Extremity/Trunk Assessment Upper Extremity Assessment Upper Extremity Assessment: Overall WFL for tasks assessed (  4/5 bilaterally, generally WFL for age, bruising on R hand noted due to fall, numbness on R side from previous stroke)   Lower Extremity Assessment Lower Extremity Assessment: Generalized weakness   Cervical / Trunk Assessment Cervical / Trunk Assessment: Normal   Communication  Communication Communication: HOH   Cognition Arousal/Alertness: Awake/alert Behavior During Therapy: WFL for tasks assessed/performed Overall Cognitive Status: Within Functional Limits for tasks assessed                                     General Comments  significant buising/swelling noted on R hand from recent fall. Pt reports unable to feel pain due to previous numbness from old stroke but can tell it's swollen.    Exercises   Shoulder Instructions      Home Living Family/patient expects to be discharged to:: Private residence Living Arrangements: Spouse/significant other Available Help at Discharge: Family;Available 24 hours/day;Personal care attendant Type of Home: House Home Access: Ramped entrance     Home Layout: One level (has full basement, but pt does not utilize)               Home Equipment: Walker - 2 wheels;Bedside commode;Hospital bed;Wheelchair - manual          Prior Functioning/Environment Level of Independence: Needs assistance  Gait / Transfers Assistance Needed: Pt able to ambulate with HHA household distances, w/c for community access ADL's / Homemaking Assistance Needed: Assist from caregiver/spouse with ADLs   Comments: 2 falls in past 12 months (1 occuring in hospital with staff on 8/4, other occured with spouse a few months ago)        OT Problem List: Decreased strength;Decreased activity tolerance;Impaired balance (sitting and/or standing)      OT Treatment/Interventions: Self-care/ADL training;Therapeutic exercise;Patient/family education;DME and/or AE instruction;Energy conservation;Therapeutic activities    OT Goals(Current goals can be found in the care plan section) Acute Rehab OT Goals Patient Stated Goal: To walk better OT Goal Formulation: With patient/family Time For Goal Achievement: 12/10/16 Potential to Achieve Goals: Good  OT Frequency: Min 1X/week   Barriers to D/C:            Co-evaluation               AM-PAC PT "6 Clicks" Daily Activity     Outcome Measure Help from another person eating meals?: A Little Help from another person taking care of personal grooming?: A Little Help from another person toileting, which includes using toliet, bedpan, or urinal?: A Lot Help from another person bathing (including washing, rinsing, drying)?: A Lot Help from another person to put on and taking off regular upper body clothing?: A Little Help from another person to put on and taking off regular lower body clothing?: A Lot 6 Click Score: 15   End of Session Equipment Utilized During Treatment: Gait belt  Activity Tolerance: Patient tolerated treatment well Patient left: in bed;with call bell/phone within reach;with family/visitor present (pt left sitting EOB with PCA to assist with transfer to [facility] and toileting needs, pt/spouse/PCA confident in assisting without OT's help.)  OT Visit Diagnosis: Other abnormalities of gait and mobility (R26.89);History of falling (Z91.81);Muscle weakness (generalized) (M62.81)                Time: 9937-1696 OT Time Calculation (min): 27 min Charges:  OT General Charges $OT Visit: 1 Procedure OT Evaluation $OT Eval Low Complexity: 1 Procedure OT Treatments $Self  Care/Home Management : 8-22 mins G-Codes:     Jeni Salles, MPH, MS, OTR/L ascom [phone] 11/26/16, 3:31 PM

## 2016-11-26 NOTE — Clinical Social Work Note (Signed)
Clinical Social Work Assessment  Patient Details  Name: Brenda Roman MRN: [id number] Date of Birth: [birth year]  Date of referral:  11/26/16               Reason for consult:  Facility Placement, Discharge Planning                Permission sought to share information with:  Case Manager, Customer service manager, Family Supports Permission granted to share information::  Yes, Verbal Permission Granted  Name::        Agency::     Relationship::  Family at bedside, daughter  Contact Information:     Housing/Transportation Living arrangements for the past 2 months:  Single Family Home Source of Information:  Medical Team, Case Manager, Adult Children Patient Interpreter Needed:  None Criminal Activity/Legal Involvement Pertinent to Current Situation/Hospitalization:  No - Comment as needed Significant Relationships:  Adult Children, Other Family Members, Spouse Lives with:  Spouse Do you feel safe going back to the place where you live?  No Need for family participation in patient care:  Yes (Comment)  Care giving concerns:  Patient admitted to the hospital with a known history of Stroke with chronic right-sided weakness, legally blind, chronic hyponatremia, history of liver transplantation for primary biliary cirrhosis and multiple other medical problems is presenting to the ED with a chief complaint of lower abdominal pain and noticed some blood in her stool. CT of the abdomen has revealed acute sigmoid diverticulitis with phlegmon but no abscess.   Of note, pt had a fall with hospital staff 11/24/16 with imaging taken revealing no acute findings.  Assessment includes: Rectal bleed due to sigmoid colon diverticulitis, fall with R hip and wrist pain, and hypotension.    Patient lives at home with her spouse and very independent prior to admission.  Family is in agreement with short term rehab and hopeful for a private room at [facility].  Family familiar with process and have  already spoke with facility.  LCSW explained briefly role in hospital and insurance pre auth process.  All in agreement with SNF at discharge.  Social Worker assessment / plan:  Consult completed for placement. SNF work up completed and LCSW will follow up with bed offers Patient's insurance requires pre-auth and LCSW has started process with faxing in clinicals, awaiting call back once reviewed.  Will follow up and assist with transition to SNF at discharge when medically stable. Plan: SNF.  Employment status:  Retired Forensic scientist:  Medicare PT Recommendations:  [place] / Referral to community resources:     Patient/Family's Response to care:  Understanding and agreeable  Patient/Family's Understanding of and Emotional Response to Diagnosis, Current Treatment, and Prognosis:  Family understanding of recommendation and also voice frustration as patient had a fall in the hospital. Open to SNF and continued treatment.   Emotional Assessment Appearance:  Appears stated age Attitude/Demeanor/Rapport:    Affect (typically observed):  Quiet (sleeping during assessment/no distress) Orientation:  Oriented to Self, Oriented to Place, Oriented to  Time, Oriented to Situation Alcohol / Substance use:  Not Applicable Psych involvement (Current and /or in the community):  No (Comment)  Discharge Needs  Concerns to be addressed:  No discharge needs identified Readmission within the last 30 days:  No Current discharge risk:  None Barriers to Discharge:  Ship broker, Continued Medical Work up   Lilly Cove, LCSW 11/26/2016, 2:50 PM

## 2016-11-26 NOTE — Clinical Social Work Placement (Signed)
   CLINICAL SOCIAL WORK PLACEMENT  NOTE  Date:  11/26/2016  Patient Details  Name: Brenda Roman MRN: [id number] Date of Birth: April 06, 1924  Clinical Social Work is seeking post-discharge placement for this patient at the [place] level of care (*CSW will initial, date and re-position this form in  chart as items are completed):  Yes   Patient/family provided with [place] Work Department's list of facilities offering this level of care within the geographic area requested by the patient (or if unable, by the patient's family).  Yes   Patient/family informed of their freedom to choose among providers that offer the needed level of care, that participate in Medicare, Medicaid or managed care program needed by the patient, have an available bed and are willing to accept the patient.  Yes   Patient/family informed of [facility]'s ownership interest in [facility] and [facility], as well as of the fact that they are under no obligation to receive care at these facilities.  PASRR submitted to EDS on       PASRR number received on       Existing PASRR number confirmed on 11/26/16     FL2 transmitted to all facilities in geographic area requested by pt/family on 11/26/16     FL2 transmitted to all facilities within larger geographic area on       Patient informed that his/her managed care company has contracts with or will negotiate with certain facilities, including the following:            Patient/family informed of bed offers received.  Patient chooses bed at       Physician recommends and patient chooses bed at      Patient to be transferred to   on  .  Patient to be transferred to facility by       Patient family notified on   of transfer.  Name of family member notified:        PHYSICIAN Please sign FL2     Additional Comment:    _______________________________________________ Lilly Cove, LCSW 11/26/2016, 2:48 PM

## 2016-11-27 LAB — BASIC METABOLIC PANEL
ANION GAP: 5 (ref 5–15)
BUN: 13 mg/dL (ref 6–20)
CALCIUM: 8.8 mg/dL — AB (ref 8.9–10.3)
CO2: 26 mmol/L (ref 22–32)
Chloride: 103 mmol/L (ref 101–111)
Creatinine, Ser: 0.75 mg/dL (ref 0.44–1.00)
GFR calc Af Amer: >60 mL/min (ref >60)
GFR calc non Af Amer: >60 mL/min (ref >60)
Glucose, Bld: 92 mg/dL (ref 65–99)
POTASSIUM: 3.7 mmol/L (ref 3.5–5.1)
SODIUM: 134 mmol/L — AB (ref 135–145)

## 2016-11-27 LAB — CBC
HCT: 29.5 % — ABNORMAL LOW (ref 35.0–47.0)
Hemoglobin: 9.9 g/dL — ABNORMAL LOW (ref 12.0–16.0)
MCH: 32.8 pg (ref 26.0–34.0)
MCHC: 33.7 g/dL (ref 32.0–36.0)
MCV: 97.4 fL (ref 80.0–100.0)
PLATELETS: 243 10*3/uL (ref 150–440)
RBC: 3.03 MIL/uL — AB (ref 3.80–5.20)
RDW: 14.9 % — AB (ref 11.5–14.5)
WBC: 6 10*3/uL (ref 3.6–11.0)

## 2016-11-27 MED ORDER — CEPHALEXIN 250 MG PO CAPS: 250.0000 mg | ORAL_CAPSULE | Freq: Two times a day (BID) | ORAL | 0 refills | Status: AC

## 2016-11-27 NOTE — Progress Notes (Addendum)
LCSW following for placement and possible discharge for today:  Discussed options regarding bed placement with husband and patient. [facility] choices were not available thus they have decided to go with [facility]. Patient will transport by husband in car per his request.  Patient husband is requesting to understand why [facility] declined and LCSW called and spoke with Seth Bake regarding denial.  Insurance is out of network.  Updated husband. He is prepared and agreeable to move forth with [facility].  Will update MD regarding plans and possible discharge. [facility] can accept patient today and will follow up. Ready for patient once insurance auth obtain: Going to Room:  607 with report to be called to Yaakov Guthrie  [phone] Still pending insurance auth with [facility] and LCSW has called insurance company to see where review is in process.  Completed intake and insurance still in review per intake.  Should be completed for discharge today.  Insurance Authorization has been obtained per April and [place]:  [id number] RUG: RVB Dates:  8/7-8/9 543 minutes of therapy a week Care Coordinator:  Threasa Beards  Fax clinicals/therapy notes: [phone]   Plan: SNF possibly today   Lane Hacker, MSW Clinical Social Work: [employer] Coverage for :  [phone]

## 2016-11-27 NOTE — Progress Notes (Signed)
Physical Therapy Treatment Patient Details Name: Brenda Roman MRN: [id number] DOB: 03/12/1924 Today's Date: 11/27/2016    History of Present Illness Pt is a 83 y.o.femalewith a known history of Stroke with chronic right-sided weakness, legally blind, chronic hyponatremia, history of liver transplantation for primary biliary cirrhosis and multiple other medical problems is presenting to the ED with a chief complaint of lower abdominal pain and noticed some blood in her stool. CT of the abdomen has revealed acute sigmoid diverticulitis with phlegmon but no abscess.Urine looks abnormal patient is started on broad spectrum IV antibiotics and hospitalist team is called to admit the patient.  Of note, pt had a fall with hospital staff 11/24/16 with imaging taken revealing no acute findings.  Assessment includes: Rectal bleed due to sigmoid colon diverticulitis, fall with R hip and wrist pain, and hypotension.     PT Comments    Pt presents with deficits in strength, transfers, mobility, gait, balance, and activity tolerance.  Good progress this session with pt requiring CGA with mod verbal cues for sequencing with transfers.  Pt able to amb 1 x 20' and 1 x 15' with HHA with antalgic gait on RLE but improved from yesterday.  Pt will benefit from PT services in a SNF setting to address above deficits for decreased caregiver assistance and safe return to PLOF upon discharge.     Follow Up Recommendations  SNF     Equipment Recommendations  None recommended by PT    Recommendations for Other Services       Precautions / Restrictions Precautions Precautions: Fall Restrictions Weight Bearing Restrictions: No    Mobility  Bed Mobility               General bed mobility comments: Bed mobility not assessed this session  Transfers Overall transfer level: Needs assistance Equipment used: 1 person hand held assist Transfers: Sit to/from Stand Sit to Stand: Min guard         General  transfer comment: Pt required min-mod verbal cues for sequencing during sit to/from stand with some posterior lean but pt able to self-correct.   Ambulation/Gait Ambulation/Gait assistance: Min assist Ambulation Distance (Feet): 20 Feet Assistive device: 1 person hand held assist Gait Pattern/deviations: Step-to pattern;Antalgic   Gait velocity interpretation: <1.8 ft/sec, indicative of risk for recurrent falls General Gait Details: Min A for stability and direction with antalgic gait on R but progressing.    Stairs            Wheelchair Mobility    Modified Rankin (Stroke Patients Only)       Balance Overall balance assessment: Needs assistance Sitting-balance support: Feet supported;Bilateral upper extremity supported Sitting balance-Leahy Scale: Fair     Standing balance support: Single extremity supported Standing balance-Leahy Scale: Fair Standing balance comment: Close CGA with standing activities secondary to slight posterior lean and VC to correct                            Cognition Arousal/Alertness: Awake/alert Behavior During Therapy: WFL for tasks assessed/performed Overall Cognitive Status: Within Functional Limits for tasks assessed                                        Exercises Total Joint Exercises Ankle Circles/Pumps: AROM;Both;10 reps Long Arc Quad: Strengthening;Both;10 reps;5 reps Knee Flexion: Strengthening;Both;5 reps;10 reps Marching in Standing: AROM;Both;5  reps Other Exercises Other Exercises: Mini squats 2 x 5    General Comments        Pertinent Vitals/Pain Pain Assessment: No/denies pain    Home Living                      Prior Function            PT Goals (current goals can now be found in the care plan section) Progress towards PT goals: Progressing toward goals    Frequency    Min 2X/week      PT Plan Current plan remains appropriate    Co-evaluation               AM-PAC PT "6 Clicks" Daily Activity  Outcome Measure                   End of Session Equipment Utilized During Treatment: Gait belt Activity Tolerance: Patient limited by pain Patient left: in chair;with chair alarm set;with call bell/phone within reach;with family/visitor present Nurse Communication: Mobility status PT Visit Diagnosis: Difficulty in walking, not elsewhere classified (R26.2);Muscle weakness (generalized) (M62.81)     Time: 7948-0165 PT Time Calculation (min) (ACUTE ONLY): 27 min  Charges:  $Gait Training: 8-22 mins $Therapeutic Exercise: 8-22 mins                    G Codes:       D. Scott Shelita Steptoe PT, DPT 11/27/16, 1:28 PM

## 2016-11-27 NOTE — Clinical Social Work Placement (Signed)
   CLINICAL SOCIAL WORK PLACEMENT  NOTE  Date:  11/27/2016  Patient Details  Name: Brenda Roman MRN: [id number] Date of Birth: Sep 30, 1923  Clinical Social Work is seeking post-discharge placement for this patient at the [place] level of care (*CSW will initial, date and re-position this form in  chart as items are completed):  Yes   Patient/family provided with [place] Work Department's list of facilities offering this level of care within the geographic area requested by the patient (or if unable, by the patient's family).  Yes   Patient/family informed of their freedom to choose among providers that offer the needed level of care, that participate in Medicare, Medicaid or managed care program needed by the patient, have an available bed and are willing to accept the patient.  Yes   Patient/family informed of [facility]'s ownership interest in [facility] and [facility], as well as of the fact that they are under no obligation to receive care at these facilities.  PASRR submitted to EDS on       PASRR number received on       Existing PASRR number confirmed on 11/26/16     FL2 transmitted to all facilities in geographic area requested by pt/family on 11/26/16     FL2 transmitted to all facilities within larger geographic area on       Patient informed that his/her managed care company has contracts with or will negotiate with certain facilities, including the following:        Yes   Patient/family informed of bed offers received.  Patient [facility] bed [facility]     Physician recommends and patient chooses bed at [facility]    Patient to be transferred to [facility] [place] on 11/27/16.  Patient to be transferred to facility by Husband/family car     Patient family notified on 11/27/16 of transfer.  Name of family member notified:  Husband: [name]     PHYSICIAN Please sign FL2     Additional  Comment:    _______________________________________________ Lilly Cove, LCSW 11/27/2016, 11:15 AM

## 2016-11-27 NOTE — Discharge Instructions (Signed)

## 2016-11-27 NOTE — Discharge Summary (Signed)
[facility] NAME: Brenda Roman    MR#:  [id number]  DATE OF BIRTH:  1923-10-17  DATE OF ADMISSION:  11/23/2016   ADMITTING PHYSICIAN: Nicholes Mango, MD  DATE OF DISCHARGE: 11/27/2016  PRIMARY CARE PHYSICIAN: Kirk Ruths, MD   ADMISSION DIAGNOSIS:  Lower abdominal pain [R10.30] Diverticulitis large intestine w/o perforation or abscess w/bleeding [K57.33] Urinary tract infection with hematuria, site unspecified [N39.0, R31.9] DISCHARGE DIAGNOSIS:  Active Problems:   Acute diverticulitis  SECONDARY DIAGNOSIS:   Past Medical History:  Diagnosis Date  . Aortic stenosis   . 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 Cancer ([place])    skin  . CHF (congestive heart failure) ([place])   . Coronary artery disease   . CVA (cerebral infarction) June '16  . Hypertension   . Liver transplanted ([place])   . Macular degeneration   . Mitral valve disease   . PBC (primary biliary cirrhosis)    s/p liver transplant  . Shingles   . TIA (transient ischemic attack)    HOSPITAL COURSE:  83 y.o. female with a known history of Stroke with chronic right-sided weakness, legally blind, chronic hyponatremia, history of liver transplantation for primary biliary cirrhosis and multiple other medical problems admitted for lower abdominal pain and noticed some blood in her stool  1.Rectal bleed due to sigmoid colon diverticulitis: hemodynamically stable, no further bleed while in the Hospital - stopped Xarelto, patient and family in agreement - continue baby asa for now and can be stopped if she has further bleed  #2. Escherichia coli UTI. continue Rocephin, possible discharge with Keflex today for 5 more days  3.Fall on 8/4 in the Hospital: Has right hip pain, right wrist pain: x-ray of the right hip, right wrist neg for any acute fracture, likely muscular injury  #4. Hypotension: stopping Toprol-XL.  5. Weakness: Physical therapy recommends  rehabilitation where she is being discharged. Patient/family decided [facility]. DISCHARGE CONDITIONS:  stable CONSULTS OBTAINED:   DRUG ALLERGIES:   Allergies  Allergen Reactions  . Cephalexin Other (See Comments)    Reaction:  Unknown  . Novocain [Procaine] Rash  . Sulfa Antibiotics Rash   DISCHARGE MEDICATIONS:   Allergies as of 11/27/2016      Reactions   Cephalexin Other (See Comments)   Reaction:  Unknown   Novocain [procaine] Rash   Sulfa Antibiotics Rash      Medication List    STOP taking these medications   metoprolol succinate 50 MG 24 hr tablet Commonly known as:  TOPROL-XL   metoprolol tartrate 25 MG tablet Commonly known as:  LOPRESSOR   Rivaroxaban 15 MG Tabs tablet Commonly known as:  XARELTO     TAKE these medications   acetaminophen 325 MG tablet Commonly known as:  TYLENOL Take 2 tablets by mouth every 6 (six) hours as needed. Reported on 10/26/2015   aspirin EC 81 MG tablet Take 81 mg by mouth daily.   cephALEXin 250 MG capsule Commonly known as:  KEFLEX Take 1 capsule (250 mg total) by mouth 2 (two) times daily.   ezetimibe-simvastatin 10-20 MG tablet Commonly known as:  VYTORIN Take 1 tablet by mouth 3 (three) times a week. Patient takes on Monday, Wednesday, Friday   lidocaine 5 % Commonly known as:  [place] 1 patch onto the skin every 12 (twelve) hours. Remove & Discard patch within 12 hours or as directed by MD   methenamine 1 g  tablet Commonly known as:  HIPREX Take 1 tablet (1 g total) by mouth 2 (two) times daily with a meal.   mycophenolate 500 MG tablet Commonly known as:  CELLCEPT Take 500 mg by mouth every 12 (twelve) hours.   nitroGLYCERIN 0.4 MG SL tablet Commonly known as:  NITROSTAT Place 1 tablet under the tongue as needed. Reported on 10/26/2015   predniSONE 5 MG tablet Commonly known as:  DELTASONE Take 5 mg by mouth daily.   traZODone 50 MG tablet Commonly known as:  DESYREL Take 50 mg by mouth at  bedtime.   valACYclovir 1000 MG tablet Commonly known as:  VALTREX Take 1,000 mg by mouth every evening. Reported on 10/26/2015        DISCHARGE INSTRUCTIONS:   DIET:  Regular diet DISCHARGE CONDITION:  Good ACTIVITY:  Activity as tolerated OXYGEN:  Home Oxygen: No.  Oxygen Delivery: room air DISCHARGE LOCATION:  nursing home   If you experience worsening of your admission symptoms, develop shortness of breath, life threatening emergency, suicidal or homicidal thoughts you must seek medical attention immediately by calling 911 or calling your MD immediately  if symptoms less severe.  You Must read complete instructions/literature along with all the possible adverse reactions/side effects for all the Medicines you take and that have been prescribed to you. Take any new Medicines after you have completely understood and accpet all the possible adverse reactions/side effects.   Please note  You were cared for by a hospitalist during your hospital stay. If you have any questions about your discharge medications or the care you received while you were in the hospital after you are discharged, you can call the unit and asked to speak with the hospitalist on call if the hospitalist that took care of you is not available. Once you are discharged, your primary care physician will handle any further medical issues. Please note that NO REFILLS for any discharge medications will be authorized once you are discharged, as it is imperative that you return to your primary care physician (or establish a relationship with a primary care physician if you do not have one) for your aftercare needs so that they can reassess your need for medications and monitor your lab values.    On the day of Discharge:  VITAL SIGNS:  Blood pressure (!) 116/57, pulse 81, temperature 98.2 F (36.8 C), temperature source Oral, resp. rate 20, height 5\' 2"  (1.575 m), weight 47.9 kg (105 lb 11.2 oz), SpO2 96 %. PHYSICAL  EXAMINATION:  GENERAL:  83 y.o.-year-old patient lying in the bed with no acute distress.  EYES: Pupils equal, round, reactive to light and accommodation. No scleral icterus. Extraocular muscles intact.  HEENT: Head atraumatic, normocephalic. Oropharynx and nasopharynx clear.  NECK:  Supple, no jugular venous distention. No thyroid enlargement, no tenderness.  LUNGS: Normal breath sounds bilaterally, no wheezing, rales,rhonchi or crepitation. No use of accessory muscles of respiration.  CARDIOVASCULAR: S1, S2 normal. No murmurs, rubs, or gallops.  ABDOMEN: Soft, non-tender, non-distended. Bowel sounds present. No organomegaly or mass.  EXTREMITIES: No pedal edema, cyanosis, or clubbing.  NEUROLOGIC: Cranial nerves II through XII are intact. Muscle strength 5/5 in all extremities. Sensation intact. Gait not checked.  PSYCHIATRIC: The patient is alert and oriented x 3.  SKIN: No obvious rash, lesion, or ulcer.  DATA REVIEW:   CBC  Recent Labs Lab 11/27/16 0459  WBC 6.0  HGB 9.9*  HCT 29.5*  PLT 243    Chemistries   Recent  Labs Lab 11/24/16 0434 11/27/16 0459  NA 131* 134*  K 4.0 3.7  CL 101 103  CO2 25 26  GLUCOSE 80 92  BUN 13 13  CREATININE 1.01* 0.75  CALCIUM 8.1* 8.8*  MG 1.9  --   AST 41  --   ALT 31  --   ALKPHOS 69  --   BILITOT 0.5  --      Microbiology Results  Urine c/s growing e.coli  Management plans discussed with the patient, family and they are in agreement.  CODE STATUS: Full Code   TOTAL TIME TAKING CARE OF THIS PATIENT: 45 minutes.    Max Sane M.D on 11/27/2016 at 12:00 PM  Between 7am to 6pm - Pager - [phone]  After 6pm go to [email or web address] - Technical brewer [facility]  Office  [phone]  CC: Primary care physician; Kirk Ruths, MD   Note: This dictation was prepared with Dragon dictation along with smaller phrase technology. Any transcriptional errors that result from this process  are unintentional.

## 2016-11-28 NOTE — Care Management (Signed)
[facility] discharged to [facility] 11/27/16.  Notified by Broadus John from [facility] on 11/28/16 that patient left [facility] 11/27/16 after being at facility for an hour.   Isaias Cowman RN, BSN Nurse Case Management: [facility] Coverage for :  [phone]

## 2016-12-31 ENCOUNTER — Ambulatory Visit: Payer: Medicare Other

## 2017-01-30 ENCOUNTER — Emergency Department: Payer: Medicare Other

## 2017-01-30 ENCOUNTER — Emergency Department
Admission: EM | Admit: 2017-01-30 | Discharge: 2017-01-30 | Disposition: A | Discharge status: Home / Self Care | Payer: Medicare Other | Attending: Emergency Medicine | Admitting: Emergency Medicine

## 2017-01-30 ENCOUNTER — Encounter: Payer: Self-pay | Admitting: Emergency Medicine

## 2017-01-30 DIAGNOSIS — I110 Hypertensive heart disease with heart failure: Secondary | ICD-10-CM | POA: Insufficient documentation

## 2017-01-30 DIAGNOSIS — M940 Chondrocostal junction syndrome [Tietze]: Secondary | ICD-10-CM

## 2017-01-30 DIAGNOSIS — R079 Chest pain, unspecified: Secondary | ICD-10-CM | POA: Diagnosis present

## 2017-01-30 DIAGNOSIS — I2510 Atherosclerotic heart disease of native coronary artery without angina pectoris: Secondary | ICD-10-CM | POA: Insufficient documentation

## 2017-01-30 DIAGNOSIS — Z8673 Personal history of transient ischemic attack (TIA), and cerebral infarction without residual deficits: Secondary | ICD-10-CM | POA: Diagnosis not present

## 2017-01-30 DIAGNOSIS — Z87891 Personal history of nicotine dependence: Secondary | ICD-10-CM | POA: Diagnosis not present

## 2017-01-30 DIAGNOSIS — I509 Heart failure, unspecified: Secondary | ICD-10-CM | POA: Insufficient documentation

## 2017-01-30 DIAGNOSIS — Z79899 Other long term (current) drug therapy: Secondary | ICD-10-CM | POA: Insufficient documentation

## 2017-01-30 DIAGNOSIS — R0781 Pleurodynia: Secondary | ICD-10-CM | POA: Insufficient documentation

## 2017-01-30 DIAGNOSIS — Z7982 Long term (current) use of aspirin: Secondary | ICD-10-CM | POA: Diagnosis not present

## 2017-01-30 LAB — BASIC METABOLIC PANEL
ANION GAP: 6 (ref 5–15)
BUN: 13 mg/dL (ref 6–20)
CALCIUM: 8.6 mg/dL — AB (ref 8.9–10.3)
CO2: 26 mmol/L (ref 22–32)
CREATININE: 0.92 mg/dL (ref 0.44–1.00)
Chloride: 102 mmol/L (ref 101–111)
GFR calc non Af Amer: 53 mL/min — ABNORMAL LOW (ref >60)
Glucose, Bld: 131 mg/dL — ABNORMAL HIGH (ref 65–99)
Potassium: 4.2 mmol/L (ref 3.5–5.1)
SODIUM: 134 mmol/L — AB (ref 135–145)

## 2017-01-30 LAB — TROPONIN I: TROPONIN I: 0.03 ng/mL — AB (ref <0.03)

## 2017-01-30 LAB — CBC
HCT: 30.8 % — ABNORMAL LOW (ref 35.0–47.0)
HEMOGLOBIN: 10.1 g/dL — AB (ref 12.0–16.0)
MCH: 30.9 pg (ref 26.0–34.0)
MCHC: 33 g/dL (ref 32.0–36.0)
MCV: 93.8 fL (ref 80.0–100.0)
PLATELETS: 293 10*3/uL (ref 150–440)
RBC: 3.28 MIL/uL — AB (ref 3.80–5.20)
RDW: 14.5 % (ref 11.5–14.5)
WBC: 5.1 10*3/uL (ref 3.6–11.0)

## 2017-01-30 MED ORDER — IOPAMIDOL (ISOVUE-370) INJECTION 76%
75.0000 mL | Freq: Once | INTRAVENOUS | Status: AC | PRN
  Administered 2017-01-30: 75 mL via INTRAVENOUS

## 2017-01-30 MED ORDER — LIDOCAINE 5 % EX PTCH
1.0000 | MEDICATED_PATCH | CUTANEOUS | Status: DC
  Administered 2017-01-30: 1 via TRANSDERMAL
  Filled 2017-01-30: qty 1

## 2017-01-30 MED ORDER — ACETAMINOPHEN 500 MG PO TABS
1000.0000 mg | ORAL_TABLET | Freq: Once | ORAL | Status: AC
  Administered 2017-01-30: 500 mg via ORAL
  Filled 2017-01-30: qty 2

## 2017-01-30 MED ORDER — CYCLOBENZAPRINE HCL 5 MG PO TABS: 5.0000 mg | ORAL_TABLET | Freq: Three times a day (TID) | ORAL | 0 refills | Status: DC | PRN

## 2017-01-30 MED ORDER — LIDOCAINE 5 % EX PTCH: 1.0000 | MEDICATED_PATCH | Freq: Two times a day (BID) | CUTANEOUS | 0 refills | Status: DC

## 2017-01-30 NOTE — ED Notes (Signed)
This RN left patient and husband voicemail on home phone. Life alert was left in room during d/c and did not go home with patients. Patients life alert placed in lost in found with patient sticker

## 2017-01-30 NOTE — ED Notes (Signed)
Patient denies any type of chest pain at this time or the past two weeks. Only Right sided rib pain

## 2017-01-30 NOTE — ED Triage Notes (Signed)
Right rib/chest pain x2 weeks ago , was seen at PCP x1 week ago with an x-ray completed. Symptoms not improving, pain worse with deep breaths

## 2017-01-30 NOTE — ED Notes (Signed)
Patient unable to sing using e signature due to visual deficit

## 2017-01-30 NOTE — ED Notes (Signed)
Patient transported to CT 

## 2017-01-30 NOTE — ED Provider Notes (Addendum)
[facility] Emergency Department Provider Note  ____________________________________________  Time seen: Approximately 12:03 PM  I have reviewed the triage vital signs and the nursing notes.   HISTORY  Chief Complaint Chest Pain   HPI Brenda Roman is a 83 y.o. female with a history of the CHF, CAD status post CABG, CVA, hypertension, PBC status post liver transplant who presents for evaluation of chest pain. Patient endorses 2 weeks of constant dull right sided chest pain located in the right lateral chest wall. She reports no pain at rest but has pain when she coughs, breathes, or moves. She denies trauma. She saw her primary care doctor5 days ago with a negative chest x-ray. She reports that the pain got worse last night and she had a hard time sleeping. At this time she reports no pain however when I sat her up she started crying complaining of severe pain. She reports that she took a half of a 325 mg Tylenol for the pain and hasn't taken anything else. She denies cough, fever, chills, abdominal pain, nausea, vomiting, diarrhea  Past Medical History:  Diagnosis Date  . Aortic stenosis   . 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 Cancer ([place])    skin  . CHF (congestive heart failure) ([place])   . Coronary artery disease   . CVA (cerebral infarction) June '16  . Hypertension   . Liver transplanted ([place])   . Macular degeneration   . Mitral valve disease   . PBC (primary biliary cirrhosis)    s/p liver transplant  . Shingles   . TIA (transient ischemic attack)     Patient Active Problem List   Diagnosis Date Noted  . Acute diverticulitis 11/23/2016  . Arrhythmia, ventricular 09/19/2015  . Hyponatremia 07/30/2015  . Acute CHF (congestive heart failure) ([place]) 07/18/2015  . 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 Closed wedge fracture of lumbar vertebra ([place]) 10/11/2014  . Ischemic stroke ([place]) 10/07/2014  . Acute urinary tract infection 10/07/2014  .  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 Closed fracture of humerus, surgical neck 09/29/2014  . Goals of care, counseling/discussion   . Acute encephalopathy 08/29/2014  . Hypoxia 08/29/2014  . Healthcare-associated pneumonia 08/29/2014  . Pneumonia 08/29/2014  . CVA (cerebral infarction) 08/23/2014  . Arteriosclerosis of coronary artery 03/15/2014  . Paroxysmal atrial fibrillation ([place]) 03/15/2014  . Atherosclerosis of abdominal aorta ([place]) 01/27/2014  . HLD (hyperlipidemia) 04/07/2012  . BP (high blood pressure) 04/07/2012  . History of liver transplant ([place]) 04/03/2012  . Encounter for other procedures for purposes other than remedying health state 04/03/2012  . Prolapse of urethra 03/10/2012  . Pelvic relaxation due to uterovaginal prolapse 03/10/2012    Past Surgical History:  Procedure Laterality Date  . BOWEL RESECTION    . COLONOSCOPY    . CORONARY ARTERY BYPASS GRAFT    . JOINT REPLACEMENT    . LIVER TRANSPLANT  1990    Prior to Admission medications   Medication Sig Start Date End Date Taking? Authorizing Provider  acetaminophen (TYLENOL) 325 MG tablet Take 2 tablets by mouth every 6 (six) hours as needed. Reported on 10/26/2015   Yes [provider]  aspirin EC 81 MG tablet Take 81 mg by mouth daily.   Yes [provider]  docusate sodium (COLACE) 50 MG capsule Take 100 mg by mouth daily.   Yes [provider]  mycophenolate (CELLCEPT) 500 MG tablet Take 500 mg by mouth every 12 (twelve) hours.  Yes [provider]  nitroGLYCERIN (NITROSTAT) 0.4 MG SL tablet Place 1 tablet under the tongue as needed. Reported on 10/26/2015 06/20/15  Yes [provider]  predniSONE (DELTASONE) 5 MG tablet Take 5 mg by mouth daily.   Yes [provider]  valACYclovir (VALTREX) 1000 MG tablet Take 1,000 mg by mouth every evening. Reported on 10/26/2015   Yes [provider]  cyclobenzaprine (FLEXERIL) 5 MG tablet Take 1 tablet (5 mg total) by  mouth 3 (three) times daily as needed for muscle spasms. 01/30/17   Alfred Levins, [place], MD  lidocaine (LIDODERM) 5 % Place 1 patch onto the skin every 12 (twelve) hours. Remove & Discard patch within 12 hours or as directed by MD 01/30/17 01/30/18  Alfred Levins, [place], MD  methenamine (HIPREX) 1 g tablet Take 1 tablet (1 g total) by mouth 2 (two) times daily with a meal. Patient not taking: Reported on 09/16/2016 10/26/15   Zara Council A, PA-C    Allergies Cephalexin; Novocain [procaine]; and Sulfa antibiotics  Family History  Problem Relation Age of Onset  . Hypertension Father     Social History Social History  Substance Use Topics  . Smoking status: Former Research scientist (life sciences)  . Smokeless tobacco: Never Used  . Alcohol use No    Review of Systems  Constitutional: Negative for fever. Eyes: Negative for visual changes. ENT: Negative for sore throat. Neck: No neck pain  Cardiovascular: + chest pain. Respiratory: Negative for shortness of breath. Gastrointestinal: Negative for abdominal pain, vomiting or diarrhea. Genitourinary: Negative for dysuria. Musculoskeletal: Negative for back pain. Skin: Negative for rash. Neurological: Negative for headaches, weakness or numbness. Psych: No SI or HI  ____________________________________________   PHYSICAL EXAM:  VITAL SIGNS: ED Triage Vitals  Enc Vitals Group     BP 01/30/17 0927 (!) 161/85     Pulse Rate 01/30/17 0927 80     Resp 01/30/17 0927 20     Temp 01/30/17 0927 97.6 F (36.4 C)     Temp Source 01/30/17 0927 Oral     SpO2 01/30/17 0927 100 %     Weight 01/30/17 0927 105 lb (47.6 kg)     Height 01/30/17 0927 5\' 2"  (1.575 m)     Head Circumference --      [facility] Flow --      Pain Score 01/30/17 0926 6     Pain Loc --      Pain Edu? --      Excl. in [place]? --     Constitutional: Alert and oriented. Well appearing and in no apparent distress. HEENT:      Head: Normocephalic and atraumatic.         Eyes: Conjunctivae are  normal. Sclera is non-icteric.       Mouth/Throat: Mucous membranes are moist.       Neck: Supple with no signs of meningismus. Cardiovascular: Regular rate and rhythm. No murmurs, gallops, or rubs. 2+ symmetrical distal pulses are present in all extremities. No JVD.There is no reproducible tenderness on palpation of the chest wall Respiratory: Normal respiratory effort. Lungs are clear to auscultation bilaterally. No wheezes, crackles, or rhonchi.  Gastrointestinal: Soft, non tender, and non distended with positive bowel sounds. No rebound or guarding. Musculoskeletal: Nontender with normal range of motion in all extremities. No edema, cyanosis, or erythema of extremities. Neurologic: Normal speech and language. Face is symmetric. Moving all extremities. No gross focal neurologic deficits are appreciated. Skin: Skin is warm, dry and intact. No rash noted.  Psychiatric: Mood and affect are normal. Speech and behavior are normal.  ____________________________________________   LABS (all labs ordered are listed, but only abnormal results are displayed)  Labs Reviewed  BASIC METABOLIC PANEL - Abnormal; Notable for the following:       Result Value   Sodium 134 (*)    Glucose, Bld 131 (*)    Calcium 8.6 (*)    GFR calc non Af Amer 53 (*)    All other components within normal limits  CBC - Abnormal; Notable for the following:    RBC 3.28 (*)    Hemoglobin 10.1 (*)    HCT 30.8 (*)    All other components within normal limits  TROPONIN I - Abnormal; Notable for the following:    Troponin I 0.03 (*)    All other components within normal limits   ____________________________________________  EKG  ED ECG REPORT I, Rudene Re, the attending physician, personally viewed and interpreted this ECG.  Normal sinus rhythm, rate of 74, left axis deviation, left bundle branch block, no ST elevations or depressions. Unchanged from  prior. ____________________________________________  RADIOLOGY  CT chest: 1. No CT findings for pulmonary embolism. 2. Stable tortuosity and heavy calcification of the thoracic and upper abdominal aorta and coronary arteries. 3. No mediastinal or hilar mass or adenopathy. 4. Stable cardiac enlargement and pulmonary artery enlargement. 5. No significant acute pulmonary findings.  ____________________________________________   PROCEDURES  Procedure(s) performed: None Procedures Critical Care performed:  None ____________________________________________   INITIAL IMPRESSION / ASSESSMENT AND PLAN / ED COURSE   83 y.o. female with a history of the CHF, CAD status post CABG, CVA, hypertension, PBC status post liver transplant who presents for evaluation of chest pain x 2 weeks. Ddx PE, pleurisy, rib fracture, pna, muscle sprain. Unlikely to be cardiac with pain only with breathing and movement. EKG unchanged from prior. Troponin unchanged from prior. CXR with no acute findings however due to comorbidities, ongoing worsening pain will send patient to CT chest. Will give tylenol and lidoderm patch for pain control.    _________________________ 1:27 PM on 01/30/2017 -----------------------------------------  CTA with no acute findings other old healed R sided rib fractures. Pain improved with tylenol and lidoderm. Patient with constant pain for 2 weeks, therefore don't believe this is cardiac in nature and no indication for repeat troponin. Will dc home on flexeril and lidoderm and f/u with PCP.   As part of my medical decision making, I reviewed the following data within the [place] History obtained from family, Nursing notes reviewed and incorporated, Labs reviewed with no acute findings, Old EKG reviewed, Old chart reviewed, Notes from prior ED visits and [place] Controlled Substance Database    Pertinent labs & imaging results that were available during my care of the  patient were reviewed by me and considered in my medical decision making (see chart for details).    ____________________________________________   FINAL CLINICAL IMPRESSION(S) / ED DIAGNOSES  Final diagnoses:  Pleuritic chest pain  Costochondritis      NEW MEDICATIONS STARTED DURING THIS VISIT:  New Prescriptions   CYCLOBENZAPRINE (FLEXERIL) 5 MG TABLET    Take 1 tablet (5 mg total) by mouth 3 (three) times daily as needed for muscle spasms.   LIDOCAINE (LIDODERM) 5 %    Place 1 patch onto the skin every 12 (twelve) hours. Remove & Discard patch within 12 hours or as directed by MD     Note:  This document was prepared  using Systems analyst and may include unintentional dictation errors.    Rudene Re, MD 01/30/17 [place], [place], MD 01/30/17 [phone]

## 2017-04-23 ENCOUNTER — Observation Stay
Admission: EM | Admit: 2017-04-23 | Discharge: 2017-04-24 | Discharge status: Against Medical Advice | Payer: Medicare Other | Attending: Internal Medicine | Admitting: Internal Medicine

## 2017-04-23 ENCOUNTER — Other Ambulatory Visit: Payer: Self-pay

## 2017-04-23 ENCOUNTER — Encounter: Payer: Self-pay | Admitting: Emergency Medicine

## 2017-04-23 DIAGNOSIS — Z79899 Other long term (current) drug therapy: Secondary | ICD-10-CM | POA: Insufficient documentation

## 2017-04-23 DIAGNOSIS — R42 Dizziness and giddiness: Secondary | ICD-10-CM | POA: Insufficient documentation

## 2017-04-23 DIAGNOSIS — Z85828 Personal history of other malignant neoplasm of skin: Secondary | ICD-10-CM | POA: Diagnosis not present

## 2017-04-23 DIAGNOSIS — Z9660 Presence of unspecified orthopedic joint implant: Secondary | ICD-10-CM | POA: Insufficient documentation

## 2017-04-23 DIAGNOSIS — K5900 Constipation, unspecified: Secondary | ICD-10-CM | POA: Diagnosis not present

## 2017-04-23 DIAGNOSIS — R197 Diarrhea, unspecified: Principal | ICD-10-CM | POA: Insufficient documentation

## 2017-04-23 DIAGNOSIS — N810 Urethrocele: Secondary | ICD-10-CM | POA: Diagnosis not present

## 2017-04-23 DIAGNOSIS — H547 Unspecified visual loss: Secondary | ICD-10-CM | POA: Insufficient documentation

## 2017-04-23 DIAGNOSIS — I2510 Atherosclerotic heart disease of native coronary artery without angina pectoris: Secondary | ICD-10-CM | POA: Diagnosis not present

## 2017-04-23 DIAGNOSIS — Z951 Presence of aortocoronary bypass graft: Secondary | ICD-10-CM | POA: Insufficient documentation

## 2017-04-23 DIAGNOSIS — I480 Paroxysmal atrial fibrillation: Secondary | ICD-10-CM | POA: Insufficient documentation

## 2017-04-23 DIAGNOSIS — Z7982 Long term (current) use of aspirin: Secondary | ICD-10-CM | POA: Insufficient documentation

## 2017-04-23 DIAGNOSIS — Z8619 Personal history of other infectious and parasitic diseases: Secondary | ICD-10-CM | POA: Diagnosis not present

## 2017-04-23 DIAGNOSIS — Z944 Liver transplant status: Secondary | ICD-10-CM | POA: Insufficient documentation

## 2017-04-23 DIAGNOSIS — R0902 Hypoxemia: Secondary | ICD-10-CM | POA: Insufficient documentation

## 2017-04-23 DIAGNOSIS — H3530 Unspecified macular degeneration: Secondary | ICD-10-CM | POA: Insufficient documentation

## 2017-04-23 DIAGNOSIS — I509 Heart failure, unspecified: Secondary | ICD-10-CM | POA: Insufficient documentation

## 2017-04-23 DIAGNOSIS — E785 Hyperlipidemia, unspecified: Secondary | ICD-10-CM | POA: Insufficient documentation

## 2017-04-23 DIAGNOSIS — G588 Other specified mononeuropathies: Secondary | ICD-10-CM | POA: Insufficient documentation

## 2017-04-23 DIAGNOSIS — I700 Atherosclerosis of aorta: Secondary | ICD-10-CM | POA: Insufficient documentation

## 2017-04-23 DIAGNOSIS — I080 Rheumatic disorders of both mitral and aortic valves: Secondary | ICD-10-CM | POA: Diagnosis not present

## 2017-04-23 DIAGNOSIS — E871 Hypo-osmolality and hyponatremia: Secondary | ICD-10-CM | POA: Insufficient documentation

## 2017-04-23 DIAGNOSIS — Z8249 Family history of ischemic heart disease and other diseases of the circulatory system: Secondary | ICD-10-CM | POA: Insufficient documentation

## 2017-04-23 DIAGNOSIS — Z8673 Personal history of transient ischemic attack (TIA), and cerebral infarction without residual deficits: Secondary | ICD-10-CM | POA: Insufficient documentation

## 2017-04-23 DIAGNOSIS — G9340 Encephalopathy, unspecified: Secondary | ICD-10-CM | POA: Diagnosis not present

## 2017-04-23 DIAGNOSIS — I110 Hypertensive heart disease with heart failure: Secondary | ICD-10-CM | POA: Diagnosis not present

## 2017-04-23 DIAGNOSIS — Z87891 Personal history of nicotine dependence: Secondary | ICD-10-CM | POA: Insufficient documentation

## 2017-04-23 DIAGNOSIS — N390 Urinary tract infection, site not specified: Secondary | ICD-10-CM | POA: Insufficient documentation

## 2017-04-23 LAB — PROTIME-INR
INR: 0.96
Prothrombin Time: 12.7 seconds (ref 11.4–15.2)

## 2017-04-23 LAB — CBC WITH DIFFERENTIAL/PLATELET
BASOS PCT: 0 %
Basophils Absolute: 0 10*3/uL (ref 0–0.1)
Eosinophils Absolute: 0.1 10*3/uL (ref 0–0.7)
Eosinophils Relative: 1 %
HCT: 30.8 % — ABNORMAL LOW (ref 35.0–47.0)
HEMOGLOBIN: 9.8 g/dL — AB (ref 12.0–16.0)
Lymphocytes Relative: 9 %
Lymphs Abs: 0.8 10*3/uL — ABNORMAL LOW (ref 1.0–3.6)
MCH: 28.9 pg (ref 26.0–34.0)
MCHC: 32 g/dL (ref 32.0–36.0)
MCV: 90.5 fL (ref 80.0–100.0)
MONOS PCT: 6 %
Monocytes Absolute: 0.6 10*3/uL (ref 0.2–0.9)
NEUTROS PCT: 84 %
Neutro Abs: 7.9 10*3/uL — ABNORMAL HIGH (ref 1.4–6.5)
Platelets: 276 10*3/uL (ref 150–440)
RBC: 3.4 MIL/uL — AB (ref 3.80–5.20)
RDW: 17 % — ABNORMAL HIGH (ref 11.5–14.5)
WBC: 9.4 10*3/uL (ref 3.6–11.0)

## 2017-04-23 LAB — COMPREHENSIVE METABOLIC PANEL
ALK PHOS: 64 U/L (ref 38–126)
ALT: 13 U/L — ABNORMAL LOW (ref 14–54)
ANION GAP: 6 (ref 5–15)
AST: 23 U/L (ref 15–41)
Albumin: 3.4 g/dL — ABNORMAL LOW (ref 3.5–5.0)
BUN: 17 mg/dL (ref 6–20)
CALCIUM: 8.9 mg/dL (ref 8.9–10.3)
CO2: 26 mmol/L (ref 22–32)
Chloride: 96 mmol/L — ABNORMAL LOW (ref 101–111)
Creatinine, Ser: 0.79 mg/dL (ref 0.44–1.00)
Glucose, Bld: 105 mg/dL — ABNORMAL HIGH (ref 65–99)
Potassium: 4 mmol/L (ref 3.5–5.1)
SODIUM: 128 mmol/L — AB (ref 135–145)
Total Bilirubin: 0.7 mg/dL (ref 0.3–1.2)
Total Protein: 5.8 g/dL — ABNORMAL LOW (ref 6.5–8.1)

## 2017-04-23 LAB — URINALYSIS, COMPLETE (UACMP) WITH MICROSCOPIC
BILIRUBIN URINE: NEGATIVE
GLUCOSE, UA: NEGATIVE mg/dL
HGB URINE DIPSTICK: NEGATIVE
KETONES UR: NEGATIVE mg/dL
NITRITE: POSITIVE — AB
PH: 6 (ref 5.0–8.0)
Protein, ur: 30 mg/dL — AB
RBC / HPF: NONE SEEN RBC/hpf (ref 0–5)
Specific Gravity, Urine: 1.012 (ref 1.005–1.030)

## 2017-04-23 LAB — LACTIC ACID, PLASMA: LACTIC ACID, VENOUS: 1.3 mmol/L (ref 0.5–1.9)

## 2017-04-23 LAB — TROPONIN I: TROPONIN I: 0.05 ng/mL — AB (ref <0.03)

## 2017-04-23 MED ORDER — ASPIRIN EC 81 MG PO TBEC
81.0000 mg | DELAYED_RELEASE_TABLET | Freq: Every day | ORAL | Status: DC
  Administered 2017-04-24: 81 mg via ORAL
  Filled 2017-04-23: qty 1

## 2017-04-23 MED ORDER — SODIUM CHLORIDE 0.9 % IV BOLUS (SEPSIS)
500.0000 mL | Freq: Once | INTRAVENOUS | Status: AC
  Administered 2017-04-23: 500 mL via INTRAVENOUS

## 2017-04-23 MED ORDER — AMPICILLIN-SULBACTAM SODIUM 1.5 (1-0.5) G IJ SOLR
1.5000 g | Freq: Once | INTRAMUSCULAR | Status: AC
  Administered 2017-04-23: 1.5 g via INTRAVENOUS
  Filled 2017-04-23: qty 1.5

## 2017-04-23 MED ORDER — HEPARIN SODIUM (PORCINE) 5000 UNIT/ML IJ SOLN
5000.0000 [IU] | Freq: Three times a day (TID) | INTRAMUSCULAR | Status: DC
  Administered 2017-04-23 – 2017-04-24 (×3): 5000 [IU] via SUBCUTANEOUS
  Filled 2017-04-23 (×3): qty 1

## 2017-04-23 MED ORDER — MELATONIN 5 MG PO TABS
2.5000 mg | ORAL_TABLET | Freq: Every day | ORAL | Status: DC
  Administered 2017-04-23: 2.5 mg via ORAL
  Filled 2017-04-23: qty 0.5

## 2017-04-23 MED ORDER — GABAPENTIN 100 MG PO CAPS
100.0000 mg | ORAL_CAPSULE | Freq: Every day | ORAL | Status: DC
  Administered 2017-04-23: 100 mg via ORAL
  Filled 2017-04-23: qty 1

## 2017-04-23 MED ORDER — ACETAMINOPHEN 325 MG PO TABS
325.0000 mg | ORAL_TABLET | ORAL | Status: DC | PRN
  Administered 2017-04-23: 325 mg via ORAL
  Filled 2017-04-23: qty 1

## 2017-04-23 MED ORDER — ACETAMINOPHEN 325 MG PO TABS: 650.0000 mg | ORAL_TABLET | Freq: Four times a day (QID) | ORAL | Status: DC | PRN

## 2017-04-23 MED ORDER — DOCUSATE SODIUM 100 MG PO CAPS
100.0000 mg | ORAL_CAPSULE | Freq: Two times a day (BID) | ORAL | Status: DC | PRN
Start: 2017-04-23 — End: 2017-04-24

## 2017-04-23 MED ORDER — PREDNISONE 5 MG PO TABS
5.0000 mg | ORAL_TABLET | Freq: Every day | ORAL | Status: DC
  Administered 2017-04-24: 5 mg via ORAL
  Filled 2017-04-23 (×2): qty 1

## 2017-04-23 MED ORDER — DEXTROSE 5 % IV SOLN
1.0000 g | INTRAVENOUS | Status: DC
  Administered 2017-04-24: 1 g via INTRAVENOUS
  Filled 2017-04-23: qty 10

## 2017-04-23 MED ORDER — VALACYCLOVIR HCL 500 MG PO TABS
500.0000 mg | ORAL_TABLET | Freq: Every evening | ORAL | Status: DC
  Filled 2017-04-23: qty 1

## 2017-04-23 MED ORDER — NITROGLYCERIN 0.4 MG SL SUBL: 0.4000 mg | SUBLINGUAL_TABLET | SUBLINGUAL | Status: DC | PRN

## 2017-04-23 MED ORDER — MYCOPHENOLATE MOFETIL 250 MG PO CAPS
500.0000 mg | ORAL_CAPSULE | Freq: Two times a day (BID) | ORAL | Status: DC
  Administered 2017-04-23 – 2017-04-24 (×2): 500 mg via ORAL
  Filled 2017-04-23 (×3): qty 2

## 2017-04-23 MED ORDER — CEFTRIAXONE SODIUM IN DEXTROSE 20 MG/ML IV SOLN
1.0000 g | INTRAVENOUS | Status: DC
  Administered 2017-04-23: 1 g via INTRAVENOUS
  Filled 2017-04-23 (×2): qty 50

## 2017-04-23 NOTE — Care Management Note (Signed)
Case Management Note  Patient Details  Name: Brenda Roman MRN: [id number] Date of Birth: 11-18-1923  Subjective/Objective:                 Patient placed in observation from home for diarrhea. Has been treated as an outpatient for uti.She is legally blind.  Lives with her husband.  PCP Frazier Richards.  NO issues accessing medical care, with transportation or obtaining medications.  She wants to discharge in the morning because her husband has planned a party for her tomorrow evening.  Action/Plan:   Expected Discharge Date:                  Expected Discharge Plan:     In-House Referral:     Discharge planning Services     Post Acute Care Choice:    Choice offered to:     DME Arranged:    DME Agency:     HH Arranged:    HH Agency:     Status of Service:     If discussed at [employer] of Stay Meetings, dates discussed:    Additional Comments:  Katrina Stack, RN 04/23/2017, 4:51 PM

## 2017-04-23 NOTE — ED Notes (Signed)
Date and time results received: 04/23/17 0857 (use smartphrase ".now" to insert current time)  Test: troponin Critical Value: 0.05  Name of Provider Notified: Burlene Arnt

## 2017-04-23 NOTE — ED Triage Notes (Signed)
Here for diarrhea since last night. No vomiting, fever or pain

## 2017-04-23 NOTE — Progress Notes (Signed)
Family Meeting Note  Advance Directive:yes  Today a meeting took place with the Patient and husband, daughter.  The following clinical team members were present during this meeting:MD  The following were discussed:Patient's diagnosis: UTI, diarrhea, Blind , Patient's progosis: Unable to determine and Goals for treatment: DNR  Additional follow-up to be provided: Treat UTI  Time spent during discussion:20 minutes  Vaughan Basta, MD

## 2017-04-23 NOTE — Care Management Obs Status (Signed)
[facility] NOTIFICATION   Patient Details  Name: Brenda Roman MRN: [id number] Date of Birth: [birth year]   Medicare Observation Status Notification Given:  Other (see comment)  Explained notice to patient's son [name] and the patient.  Patient is legally blind.  Bill did not feel patient should sign notice and he did not feel that he should be the one to sign it. Patient nor her son had any questions about the notice.   Patient's husband will be coming to unit later tonight  Katrina Stack, RN 04/23/2017, 4:48 PM

## 2017-04-23 NOTE — H&P (Addendum)
[facility] [place] NAME: Brenda Roman    MR#:  [id number]  DATE OF BIRTH:  26-Jan-1924  DATE OF ADMISSION:  04/23/2017  PRIMARY CARE PHYSICIAN: Kirk Ruths, MD   REQUESTING/REFERRING PHYSICIAN: Devra Dopp  CHIEF COMPLAINT:   Chief Complaint  Patient presents with  . Diarrhea    HISTORY OF PRESENT ILLNESS: Brenda Roman  is a 84 y.o. female with a known history of aortic stenosis, CHF, coronary artery disease, hypertension, stroke- lives with her husband, have blindness and need help with her mobility by husband. Takes laxatives every day at home including milk of magnesia, MiraLAX, Dulcolax. Last night she took her routine stool softeners and fall tonight Having repeated episodes of diarrhea so finally brought to emergency room this morning. Urinalysis was positive and patient was feeling dizzy with blood pressure running borderline so given to hospitalist team for further management. On further questioning husband suggested patient was having UTI recently in last 1 month, initially was given ciprofloxacin but later culture result reported she was not responding so her primary care changed to another antibiotic which she does not remember the name. She denies any vomiting or abdominal pain with this. She doesn't have any fever or chills.  PAST MEDICAL HISTORY:   Past Medical History:  Diagnosis Date  . Aortic stenosis   . 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 Cancer ([place])    skin  . CHF (congestive heart failure) ([place])   . Coronary artery disease   . CVA (cerebral infarction) June '16  . Hypertension   . Liver transplanted ([place])   . Macular degeneration   . Mitral valve disease   . PBC (primary biliary cirrhosis)    s/p liver transplant  . Shingles   . TIA (transient ischemic attack)     PAST SURGICAL HISTORY:  Past Surgical History:  Procedure Laterality Date  . BOWEL RESECTION    . COLONOSCOPY    . CORONARY ARTERY BYPASS GRAFT     . JOINT REPLACEMENT    . LIVER TRANSPLANT  1990    SOCIAL HISTORY:  Social History   Tobacco Use  . Smoking status: Former Research scientist (life sciences)  . Smokeless tobacco: Never Used  Substance Use Topics  . Alcohol use: No    FAMILY HISTORY:  Family History  Problem Relation Age of Onset  . Hypertension Father     DRUG ALLERGIES:  Allergies  Allergen Reactions  . Cephalexin Other (See Comments)    Reaction:  Unknown  . Novocain [Procaine] Rash  . Sulfa Antibiotics Rash    REVIEW OF SYSTEMS:   CONSTITUTIONAL: No fever, fatigue or weakness.  EYES: No blurred or double vision.  EARS, NOSE, AND THROAT: No tinnitus or ear pain.  RESPIRATORY: No cough, shortness of breath, wheezing or hemoptysis.  CARDIOVASCULAR: No chest pain, orthopnea, edema.  GASTROINTESTINAL: No nausea, vomiting, have diarrhea , no abdominal pain.  GENITOURINARY: No dysuria, hematuria.  ENDOCRINE: No polyuria, nocturia,  HEMATOLOGY: No anemia, easy bruising or bleeding SKIN: No rash or lesion. MUSCULOSKELETAL: No joint pain or arthritis.   NEUROLOGIC: No tingling, numbness,general weakness.  PSYCHIATRY: No anxiety or depression.   MEDICATIONS AT HOME:  Prior to Admission medications   Medication Sig Start Date End Date Taking? Authorizing Provider  acetaminophen (TYLENOL) 325 MG tablet Take 2 tablets by mouth every 6 (six) hours as needed. Reported on 10/26/2015   Yes [provider]  aspirin EC 81 MG tablet  Take 81 mg by mouth daily.   Yes [provider]  docusate sodium (COLACE) 50 MG capsule Take 100 mg by mouth daily.   Yes [provider]  gabapentin (NEURONTIN) 100 MG capsule Take 100 mg by mouth as needed.  04/03/17  Yes [provider]  lidocaine (LIDODERM) 5 % Place 1 patch onto the skin every 12 (twelve) hours. Remove & Discard patch within 12 hours or as directed by MD 01/30/17 01/30/18 Yes Alfred Levins, [place], MD  mycophenolate (CELLCEPT) 500 MG tablet Take 500 mg by  mouth every 12 (twelve) hours.   Yes [provider]  nitroGLYCERIN (NITROSTAT) 0.4 MG SL tablet Place 1 tablet under the tongue as needed. Reported on 10/26/2015 06/20/15  Yes [provider]  predniSONE (DELTASONE) 5 MG tablet Take 5 mg by mouth daily.   Yes [provider]  valACYclovir (VALTREX) 500 MG tablet Take 500 mg by mouth every evening.   Yes [provider]  cyclobenzaprine (FLEXERIL) 5 MG tablet Take 1 tablet (5 mg total) by mouth 3 (three) times daily as needed for muscle spasms. Patient not taking: Reported on 04/23/2017 01/30/17   Rudene Re, MD  methenamine (HIPREX) 1 g tablet Take 1 tablet (1 g total) by mouth 2 (two) times daily with a meal. Patient not taking: Reported on 09/16/2016 10/26/15   Zara Council A, PA-C      PHYSICAL EXAMINATION:   VITAL SIGNS: Blood pressure (!) 126/56, pulse 60, temperature 97.8 F (36.6 C), temperature source Oral, resp. rate 16, height 5\' 2"  (1.575 m), weight 47.4 kg (104 lb 8 oz), SpO2 99 %.  GENERAL:  84 y.o.-year-old patient lying in the bed with no acute distress.  EYES: Pupils equal, round, reactive to light and accommodation. No scleral icterus.legally blind. Extraocular muscles intact.  HEENT: Head atraumatic, normocephalic. Oropharynx and nasopharynx clear.  NECK:  Supple, no jugular venous distention. No thyroid enlargement, no tenderness.  LUNGS: Normal breath sounds bilaterally, no wheezing, rales,rhonchi or crepitation. No use of accessory muscles of respiration.  CARDIOVASCULAR: S1, S2 normal. No murmurs, rubs, or gallops.  ABDOMEN: Soft, nontender, nondistended. Bowel sounds present. No organomegaly or mass.  EXTREMITIES: No pedal edema, cyanosis, or clubbing.  NEUROLOGIC: Cranial nerves II through XII are intact. Muscle strength 4-5/5 in all extremities. Sensation intact. Gait not checked.  PSYCHIATRIC: The patient is alert and oriented x 3.  SKIN: No obvious rash, lesion, or ulcer.    LABORATORY PANEL:   CBC Recent Labs  Lab 04/23/17 0754  WBC 9.4  HGB 9.8*  HCT 30.8*  PLT 276  MCV 90.5  MCH 28.9  MCHC 32.0  RDW 17.0*  LYMPHSABS 0.8*  MONOABS 0.6  EOSABS 0.1  BASOSABS 0.0   ------------------------------------------------------------------------------------------------------------------  Chemistries  Recent Labs  Lab 04/23/17 0754  NA 128*  K 4.0  CL 96*  CO2 26  GLUCOSE 105*  BUN 17  CREATININE 0.79  CALCIUM 8.9  AST 23  ALT 13*  ALKPHOS 64  BILITOT 0.7   ------------------------------------------------------------------------------------------------------------------ estimated creatinine clearance is 34.3 mL/min (by C-G formula based on SCr of 0.79 mg/dL). ------------------------------------------------------------------------------------------------------------------ No results for input(s): TSH, T4TOTAL, T3FREE, THYROIDAB in the last 72 hours.  Invalid input(s): FREET3   Coagulation profile Recent Labs  Lab 04/23/17 0754  INR 0.96   ------------------------------------------------------------------------------------------------------------------- No results for input(s): DDIMER in the last 72 hours. -------------------------------------------------------------------------------------------------------------------  Cardiac Enzymes Recent Labs  Lab 04/23/17 0754  TROPONINI 0.05*   ------------------------------------------------------------------------------------------------------------------ Invalid input(s): POCBNP  ---------------------------------------------------------------------------------------------------------------  Urinalysis    Component Value Date/Time   COLORURINE YELLOW (A) 04/23/2017 0757   APPEARANCEUR HAZY (A) 04/23/2017 0757   APPEARANCEUR Cloudy (A) 11/20/2016 1127   LABSPEC 1.012 04/23/2017 0757   LABSPEC 1.005 12/03/2013 2222   PHURINE 6.0 04/23/2017 0757   GLUCOSEU NEGATIVE 04/23/2017  0757   GLUCOSEU Negative 12/03/2013 2222   HGBUR NEGATIVE 04/23/2017 0757   BILIRUBINUR NEGATIVE 04/23/2017 0757   BILIRUBINUR Negative 11/20/2016 1127   BILIRUBINUR Negative 12/03/2013 2222   KETONESUR NEGATIVE 04/23/2017 0757   PROTEINUR 30 (A) 04/23/2017 0757   NITRITE POSITIVE (A) 04/23/2017 0757   LEUKOCYTESUR LARGE (A) 04/23/2017 0757   LEUKOCYTESUR 1+ (A) 11/20/2016 1127   LEUKOCYTESUR Negative 12/03/2013 2222     RADIOLOGY: No results found.  EKG: Orders placed or performed during the hospital encounter of 04/23/17  . ED EKG  . ED EKG    IMPRESSION AND PLAN:  * UTI   She had urine culture with Escherichia coli resistant to Cipro, as per outpatient records in November.   Cephalexin was noted in her allergy list but neither she nor her family are confirmed about her reaction. So I will give IV ceftriaxone with a slow rate initially to rule out any reaction.   Urine culture.  * Diarrhea   As patient had excessive legs it is use, but this is her regular medicines every day.   I will check for C. difficile as she had antibiotics used in last 1 month.  * Status post liver transplant   Continue CellCept and prednisone.  * History of CAD    Continue aspirin.  All the records are reviewed and case discussed with ED provider. Management plans discussed with the patient, family and they are in agreement.  CODE STATUS: DNR    Code Status Orders  (From admission, onward)        Start     Ordered   04/23/17 1143  Do not attempt resuscitation (DNR)  Continuous    Question Answer Comment  In the event of cardiac or respiratory ARREST Do not call a "code blue"   In the event of cardiac or respiratory ARREST Do not perform Intubation, CPR, defibrillation or ACLS   In the event of cardiac or respiratory ARREST Use medication by any route, position, wound care, and other measures to relive pain and suffering. May use oxygen, suction and manual treatment of airway  obstruction as needed for comfort.   Comments confirmed with daughter and husband.      04/23/17 1142    Code Status History    Date Active Date Inactive Code Status Order ID Comments User Context   11/23/2016 20:01 11/27/2016 18:51 Full Code [id number]  Nicholes Mango, MD Inpatient   09/19/2015 05:04 09/21/2015 12:10 DNR [id number]  Mikael Spray, NP ED   09/19/2015 04:58 09/19/2015 04:58 Full Code [id number]  Mikael Spray, NP ED   07/30/2015 05:08 08/01/2015 14:17 DNR [id number]  Harrie Foreman, MD Inpatient   07/18/2015 21:21 07/20/2015 14:59 DNR [id number]  Dustin Flock, MD Inpatient   08/29/2014 21:02 09/05/2014 15:27 DNR [id number]  Demetrios Loll, MD ED   08/23/2014 17:55 08/25/2014 17:42 Full Code [id number]  Bettey Costa, MD Inpatient    Advance Directive Documentation     Most Recent Value  Type of Advance Directive  Healthcare Power of Attorney, Living will  Pre-existing out of facility DNR order (yellow form or pink MOST form)  No data  "MOST" Form in Place?  No data     I discussed with her daughter and husband in the room.  TOTAL TIME TAKING CARE OF THIS PATIENT: 55 minutes.    Vaughan Basta M.D on 04/23/2017   Between 7am to 6pm - Pager - [phone]  After 6pm go to [email or web address] - password [email or web address] [facility]  Office  [phone]  CC: Primary care physician; Kirk Ruths, MD   Note: This dictation was prepared with Dragon dictation along with smaller phrase technology. Any transcriptional errors that result from this process are unintentional.

## 2017-04-23 NOTE — ED Notes (Signed)
Assisted on and off bedpan.

## 2017-04-23 NOTE — ED Provider Notes (Signed)
[facility] Emergency Department Provider Note  ____________________________________________   I have reviewed the triage vital signs and the nursing notes. Where available I have reviewed prior notes and, if possible and indicated, outside hospital notes.    HISTORY  Chief Complaint Diarrhea    HPI Brenda Roman is a 84 y.o. female hx of CHF, CAD status post CABG, CVA, hypertension, PBC status post liver transplant, has been constipated for several days which is not unusual for her, she has been taking lactulose and took an extra laxative last night and began having diarrhea nonbloody non-melanotic, she had multiple episodes of diarrhea and felt lightheaded this morning.  Now she feels better.  Denies abdominal pain fever chills vomiting chest pain shortness of breath or any other concerning symptoms.  No other alleviating or aggravating factors, no recent travel no recent antibiotics was feeling fine until she took the extra laxative   Past Medical History:  Diagnosis Date  . Aortic stenosis   . 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 Cancer ([place])    skin  . CHF (congestive heart failure) ([place])   . Coronary artery disease   . CVA (cerebral infarction) June '16  . Hypertension   . Liver transplanted ([place])   . Macular degeneration   . Mitral valve disease   . PBC (primary biliary cirrhosis)    s/p liver transplant  . Shingles   . TIA (transient ischemic attack)     Patient Active Problem List   Diagnosis Date Noted  . Acute diverticulitis 11/23/2016  . Arrhythmia, ventricular 09/19/2015  . Hyponatremia 07/30/2015  . Acute CHF (congestive heart failure) ([place]) 07/18/2015  . 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 Closed wedge fracture of lumbar vertebra ([place]) 10/11/2014  . Ischemic stroke ([place]) 10/07/2014  . Acute urinary tract infection 10/07/2014  . 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 Closed fracture of humerus, surgical neck 09/29/2014  . Goals of  care, counseling/discussion   . Acute encephalopathy 08/29/2014  . Hypoxia 08/29/2014  . Healthcare-associated pneumonia 08/29/2014  . Pneumonia 08/29/2014  . CVA (cerebral infarction) 08/23/2014  . Arteriosclerosis of coronary artery 03/15/2014  . Paroxysmal atrial fibrillation ([place]) 03/15/2014  . Atherosclerosis of abdominal aorta ([place]) 01/27/2014  . HLD (hyperlipidemia) 04/07/2012  . BP (high blood pressure) 04/07/2012  . History of liver transplant ([place]) 04/03/2012  . Encounter for other procedures for purposes other than remedying health state 04/03/2012  . Prolapse of urethra 03/10/2012  . Pelvic relaxation due to uterovaginal prolapse 03/10/2012    Past Surgical History:  Procedure Laterality Date  . BOWEL RESECTION    . COLONOSCOPY    . CORONARY ARTERY BYPASS GRAFT    . JOINT REPLACEMENT    . LIVER TRANSPLANT  1990    Prior to Admission medications   Medication Sig Start Date End Date Taking? Authorizing Provider  acetaminophen (TYLENOL) 325 MG tablet Take 2 tablets by mouth every 6 (six) hours as needed. Reported on 10/26/2015    [provider]  aspirin EC 81 MG tablet Take 81 mg by mouth daily.    [provider]  cyclobenzaprine (FLEXERIL) 5 MG tablet Take 1 tablet (5 mg total) by mouth 3 (three) times daily as needed for muscle spasms. 01/30/17   Rudene Re, MD  docusate sodium (COLACE) 50 MG capsule Take 100 mg by mouth daily.    [provider]  lidocaine (LIDODERM) 5 % Place 1 patch onto the skin every 12 (twelve) hours.  Remove & Discard patch within 12 hours or as directed by MD 01/30/17 01/30/18  Alfred Levins, [place], MD  methenamine (HIPREX) 1 g tablet Take 1 tablet (1 g total) by mouth 2 (two) times daily with a meal. Patient not taking: Reported on 09/16/2016 10/26/15   Zara Council A, PA-C  mycophenolate (CELLCEPT) 500 MG tablet Take 500 mg by mouth every 12 (twelve) hours.    [provider]  nitroGLYCERIN (NITROSTAT)  0.4 MG SL tablet Place 1 tablet under the tongue as needed. Reported on 10/26/2015 06/20/15   [provider]  predniSONE (DELTASONE) 5 MG tablet Take 5 mg by mouth daily.    [provider]  valACYclovir (VALTREX) 1000 MG tablet Take 1,000 mg by mouth every evening. Reported on 10/26/2015    [provider]    Allergies Cephalexin; Novocain [procaine]; and Sulfa antibiotics  Family History  Problem Relation Age of Onset  . Hypertension Father     Social History Social History   Tobacco Use  . Smoking status: Former Research scientist (life sciences)  . Smokeless tobacco: Never Used  Substance Use Topics  . Alcohol use: No  . Drug use: No    Review of Systems Constitutional: No fever/chills Eyes: No visual changes. ENT: No sore throat. No stiff neck no neck pain Cardiovascular: Denies chest pain. Respiratory: Denies shortness of breath. Gastrointestinal:   no vomiting.  +diarrhea.  + constipation. Genitourinary: Negative for dysuria. Musculoskeletal: Negative lower extremity swelling Skin: Negative for rash. Neurological: Negative for severe headaches, focal weakness or numbness.   ____________________________________________   PHYSICAL EXAM:  VITAL SIGNS: ED Triage Vitals  Enc Vitals Group     BP 04/23/17 0752 124/78     Pulse Rate 04/23/17 0752 77     Resp 04/23/17 0752 18     Temp 04/23/17 0752 98 F (36.7 C)     Temp Source 04/23/17 0752 Oral     SpO2 04/23/17 0752 98 %     Weight 04/23/17 0747 103 lb (46.7 kg)     Height 04/23/17 0747 5\' 2"  (1.575 m)     Head Circumference --      Peak Flow --      Pain Score --      Pain Loc --      Pain Edu? --      Excl. in [place]? --     Constitutional: Alert and oriented. Well appearing and in no acute distress. Eyes: Conjunctivae are normal Head: Atraumatic HEENT: No congestion/rhinnorhea. Mucous membranes are moist.  Oropharynx non-erythematous Neck:   Nontender with no meningismus, no masses, no  stridor Cardiovascular: Normal rate, regular rhythm. Grossly normal heart sounds.  Good peripheral circulation. Respiratory: Normal respiratory effort.  No retractions. Lungs CTAB. Abdominal: Soft and nontender. No distention. No guarding no rebound Back:  There is no focal tenderness or step off.  there is no midline tenderness there are no lesions noted. there is no CVA tenderness Musculoskeletal: No lower extremity tenderness, no upper extremity tenderness. No joint effusions, no DVT signs strong distal pulses no edema Neurologic:  Normal speech and language. No gross focal neurologic deficits are appreciated.  Skin:  Skin is warm, dry and intact. No rash noted. Psychiatric: Mood and affect are normal. Speech and behavior are normal.  ____________________________________________   LABS (all labs ordered are listed, but only abnormal results are displayed)  Labs Reviewed  COMPREHENSIVE METABOLIC PANEL  CBC WITH DIFFERENTIAL/PLATELET  LACTIC ACID, PLASMA  LACTIC ACID, PLASMA  TROPONIN I  URINALYSIS, COMPLETE (UACMP) WITH MICROSCOPIC  PROTIME-INR    Pertinent labs  results that were available during my care of the patient were reviewed by me and considered in my medical decision making (see chart for details). ____________________________________________  EKG  I personally interpreted any EKGs ordered by me or triage  ____________________________________________  RADIOLOGY  Pertinent labs & imaging results that were available during my care of the patient were reviewed by me and considered in my medical decision making (see chart for details). If possible, patient and/or family made aware of any abnormal findings.  No results found. ____________________________________________    PROCEDURES  Procedure(s) performed: None  Procedures  Critical Care performed: None  ____________________________________________   INITIAL IMPRESSION / ASSESSMENT AND PLAN / ED  COURSE  Pertinent labs & imaging results that were available during my care of the patient were reviewed by me and considered in my medical decision making (see chart for details).  Patient with a benign abdomen, reassuring vital signs, given the fact that she is a transplant patient we will check basic blood work and urine etc. to make sure no other pathologies are present because she felt lightheaded but mostly this sounds like a reaction to her laxatives.  We will give her a gentle hydration of 500 cc because of her history of CHF and we will continue to observe her vital signs are stable very reassuring history and exam.    ____________________________________________   FINAL CLINICAL IMPRESSION(S) / ED DIAGNOSES  Final diagnoses:  None      This chart was dictated using voice recognition software.  Despite best efforts to proofread,  errors can occur which can change meaning.      Schuyler Amor, MD 04/23/17 [phone]

## 2017-04-24 LAB — BASIC METABOLIC PANEL
ANION GAP: 5 (ref 5–15)
BUN: 15 mg/dL (ref 6–20)
CHLORIDE: 104 mmol/L (ref 101–111)
CO2: 25 mmol/L (ref 22–32)
Calcium: 8.4 mg/dL — ABNORMAL LOW (ref 8.9–10.3)
Creatinine, Ser: 0.87 mg/dL (ref 0.44–1.00)
GFR calc Af Amer: >60 mL/min (ref >60)
GFR calc non Af Amer: 57 mL/min — ABNORMAL LOW (ref >60)
GLUCOSE: 74 mg/dL (ref 65–99)
POTASSIUM: 4.4 mmol/L (ref 3.5–5.1)
Sodium: 134 mmol/L — ABNORMAL LOW (ref 135–145)

## 2017-04-24 LAB — CBC
HEMATOCRIT: 27.8 % — AB (ref 35.0–47.0)
HEMOGLOBIN: 8.9 g/dL — AB (ref 12.0–16.0)
MCH: 28.9 pg (ref 26.0–34.0)
MCHC: 31.9 g/dL — AB (ref 32.0–36.0)
MCV: 90.6 fL (ref 80.0–100.0)
Platelets: 237 10*3/uL (ref 150–440)
RBC: 3.07 MIL/uL — ABNORMAL LOW (ref 3.80–5.20)
RDW: 16.5 % — ABNORMAL HIGH (ref 11.5–14.5)
WBC: 3.4 10*3/uL — ABNORMAL LOW (ref 3.6–11.0)

## 2017-04-24 LAB — URINE CULTURE

## 2017-04-24 NOTE — Care Management Obs Status (Signed)
[place] NOTIFICATION   Patient Details  Name: Brenda Roman MRN: [id number] Date of Birth: [birth year]   Medicare Observation Status Notification Given:  Yes    Beverly Sessions, RN 04/24/2017, 11:13 AM

## 2017-04-24 NOTE — Progress Notes (Signed)
Patient left hospital against medical advice she stated " I have a party to go to tonight they are coming to sing to me". Patient and husband educated on why they needed to stay at hospital and still verbalized decision to leave the hospital, husband stated " she does not need to tyay here to get antibiotics, honey we will call your doctor when we get home and that will be that. Husband signed AMA form and it has been placed in chart.

## 2017-04-24 NOTE — Progress Notes (Signed)
[facility] NAME: Brenda Roman    MR#:  [id number]  DATE OF BIRTH:  1923-10-28  SUBJECTIVE:  CHIEF COMPLAINT: Patient denies any diarrhea no complaints.  Husband at bedside  REVIEW OF SYSTEMS:  CONSTITUTIONAL: No fever, fatigue or weakness.  EYES: No blurred or double vision.  EARS, NOSE, AND THROAT: No tinnitus or ear pain.  RESPIRATORY: No cough, shortness of breath, wheezing or hemoptysis.  CARDIOVASCULAR: No chest pain, orthopnea, edema.  GASTROINTESTINAL: No nausea, vomiting, diarrhea or abdominal pain.  GENITOURINARY: No dysuria, hematuria.  ENDOCRINE: No polyuria, nocturia,  HEMATOLOGY: No anemia, easy bruising or bleeding SKIN: No rash or lesion. MUSCULOSKELETAL: No joint pain or arthritis.   NEUROLOGIC: No tingling, numbness, weakness.  PSYCHIATRY: No anxiety or depression.   DRUG ALLERGIES:   Allergies  Allergen Reactions  . Cephalexin Other (See Comments)    Reaction:  Unknown  . Novocain [Procaine] Rash  . Sulfa Antibiotics Rash    VITALS:  Blood pressure 125/63, pulse 63, temperature 98 F (36.7 C), temperature source Oral, resp. rate 20, height 5\' 2"  (1.575 m), weight 47.4 kg (104 lb 8 oz), SpO2 95 %.  PHYSICAL EXAMINATION:  GENERAL:  84 y.o.-year-old patient lying in the bed with no acute distress.  EYES: Pupils equal, round, reactive to light and accommodation. No scleral icterus. Extraocular muscles intact.  HEENT: Head atraumatic, normocephalic. Oropharynx and nasopharynx clear.  NECK:  Supple, no jugular venous distention. No thyroid enlargement, no tenderness.  LUNGS: Normal breath sounds bilaterally, no wheezing, rales,rhonchi or crepitation. No use of accessory muscles of respiration.  CARDIOVASCULAR: S1, S2 normal. No murmurs, rubs, or gallops.  ABDOMEN: Soft, nontender, nondistended. Bowel sounds present. No organomegaly or mass.  EXTREMITIES: No pedal edema, cyanosis, or clubbing.   NEUROLOGIC: Cranial nerves II through XII are intact. Muscle strength 5/5 in all extremities. Sensation intact. Gait not checked.  PSYCHIATRIC: The patient is alert and oriented x 3.  SKIN: No obvious rash, lesion, or ulcer.    LABORATORY PANEL:   CBC Recent Labs  Lab 04/24/17 0451  WBC 3.4*  HGB 8.9*  HCT 27.8*  PLT 237   ------------------------------------------------------------------------------------------------------------------  Chemistries  Recent Labs  Lab 04/23/17 0754 04/24/17 0451  NA 128* 134*  K 4.0 4.4  CL 96* 104  CO2 26 25  GLUCOSE 105* 74  BUN 17 15  CREATININE 0.79 0.87  CALCIUM 8.9 8.4*  AST 23  --   ALT 13*  --   ALKPHOS 64  --   BILITOT 0.7  --    ------------------------------------------------------------------------------------------------------------------  Cardiac Enzymes Recent Labs  Lab 04/23/17 0754  TROPONINI 0.05*   ------------------------------------------------------------------------------------------------------------------  RADIOLOGY:  No results found.  EKG:   Orders placed or performed during the hospital encounter of 04/23/17  . ED EKG  . ED EKG    ASSESSMENT AND PLAN:    * UTI   She had urine culture with Escherichia coli resistant to Cipro, as per outpatient records in November.   Cephalexin was noted in her allergy list but neither she nor her family are confirmed about her reaction.  Patient is started on IV ceftriaxone.  Urine cultures and blood cultures are pending      * Diarrhea  Diarrhea completely resolved.  No bowel movement for the past 24 hours   * Status post liver transplant   Continue CellCept and prednisone.  * History of CAD    Continue aspirin.  All the records are reviewed and case discussed with Care Management/Social Workerr. Management plans discussed with the patient, son  and they are in agreement.  CODE STATUS: DNR   TOTAL TIME TAKING CARE OF THIS PATIENT:  36 minutes.   Patient left hospital AGAINST MEDICAL ADVICE after my follow-up visit Note: This dictation was prepared with Dragon dictation along with smaller phrase technology. Any transcriptional errors that result from this process are unintentional.   Nicholes Mango M.D on 04/24/2017 at 2:22 PM  Between 7am to 6pm - Pager - [phone] After 6pm go to [email or web address] - password [email or web address] [facility]  [facility]  Office  [phone]  CC: Primary care physician; Kirk Ruths, MD

## 2017-04-24 NOTE — Discharge Summary (Signed)
Date of admission 04/23/2017 Date left hospital AGAINST MEDICAL ADVICE on 04/24/2017  HISTORY OF PRESENT ILLNESS: Brenda Roman  is a 84 y.o. female with a known history of aortic stenosis, CHF, coronary artery disease, hypertension, stroke- lives with her husband, have blindness and need help with her mobility by husband. Takes laxatives every day at home including milk of magnesia, MiraLAX, Dulcolax. Last night she took her routine stool softeners and fall tonight Having repeated episodes of diarrhea so finally brought to emergency room this morning. Urinalysis was positive and patient was feeling dizzy with blood pressure running borderline so given to hospitalist team for further management. On further questioning husband suggested patient was having UTI recently in last 1 month, initially was given ciprofloxacin but later culture result reported she was not responding so her primary care changed to another antibiotic which she does not remember the name. She denies any vomiting or abdominal pain with this. She doesn't have any fever or chills    *UTI She had urine culture with Escherichia coli resistant to Cipro, as per outpatient records in November. Cephalexin was noted in her allergy list but neither she nor her family are confirmed about her reaction.  Patient is started on IV ceftriaxone.  Urine cultures and blood cultures are pending   *Diarrhea Diarrhea completely resolved.  No bowel movement for the past 24 hours   *Status post liver transplant Continue CellCept and prednisone.  *History of CAD Continue aspirin.    Patient wanted to be discharged home as friends are coming to party.  She could not wait until discharge paperwork is done and wants to leave hospital [place]

## 2017-04-28 LAB — CULTURE, BLOOD (ROUTINE X 2)
CULTURE: NO GROWTH
Culture: NO GROWTH
Special Requests: ADEQUATE

## 2017-04-30 ENCOUNTER — Other Ambulatory Visit: Payer: Medicare Other | Attending: Family Medicine | Admitting: Family Medicine

## 2017-04-30 DIAGNOSIS — R296 Repeated falls: Secondary | ICD-10-CM | POA: Insufficient documentation

## 2017-06-05 ENCOUNTER — Emergency Department: Payer: Medicare Other

## 2017-06-05 ENCOUNTER — Other Ambulatory Visit: Payer: Self-pay

## 2017-06-05 ENCOUNTER — Encounter: Payer: Self-pay | Admitting: Emergency Medicine

## 2017-06-05 ENCOUNTER — Inpatient Hospital Stay
Admission: EM | Admit: 2017-06-05 | Discharge: 2017-06-07 | DRG: 309 | Disposition: A | Discharge status: Home / Self Care | Payer: Medicare Other | Attending: Internal Medicine | Admitting: Internal Medicine

## 2017-06-05 ENCOUNTER — Observation Stay
Admit: 2017-06-05 | Discharge: 2017-06-05 | Disposition: A | Discharge status: Home / Self Care | Payer: Medicare Other | Attending: Specialist | Admitting: Specialist

## 2017-06-05 DIAGNOSIS — R079 Chest pain, unspecified: Secondary | ICD-10-CM

## 2017-06-05 DIAGNOSIS — R778 Other specified abnormalities of plasma proteins: Secondary | ICD-10-CM

## 2017-06-05 DIAGNOSIS — I482 Chronic atrial fibrillation: Principal | ICD-10-CM | POA: Diagnosis present

## 2017-06-05 DIAGNOSIS — I700 Atherosclerosis of aorta: Secondary | ICD-10-CM | POA: Diagnosis present

## 2017-06-05 DIAGNOSIS — Z7982 Long term (current) use of aspirin: Secondary | ICD-10-CM

## 2017-06-05 DIAGNOSIS — Z66 Do not resuscitate: Secondary | ICD-10-CM | POA: Diagnosis present

## 2017-06-05 DIAGNOSIS — R7989 Other specified abnormal findings of blood chemistry: Secondary | ICD-10-CM

## 2017-06-05 DIAGNOSIS — Z7952 Long term (current) use of systemic steroids: Secondary | ICD-10-CM

## 2017-06-05 DIAGNOSIS — H3530 Unspecified macular degeneration: Secondary | ICD-10-CM | POA: Diagnosis present

## 2017-06-05 DIAGNOSIS — Z884 Allergy status to anesthetic agent status: Secondary | ICD-10-CM

## 2017-06-05 DIAGNOSIS — Z87891 Personal history of nicotine dependence: Secondary | ICD-10-CM

## 2017-06-05 DIAGNOSIS — I110 Hypertensive heart disease with heart failure: Secondary | ICD-10-CM | POA: Diagnosis present

## 2017-06-05 DIAGNOSIS — Z8249 Family history of ischemic heart disease and other diseases of the circulatory system: Secondary | ICD-10-CM

## 2017-06-05 DIAGNOSIS — I080 Rheumatic disorders of both mitral and aortic valves: Secondary | ICD-10-CM | POA: Diagnosis present

## 2017-06-05 DIAGNOSIS — Z883 Allergy status to other anti-infective agents status: Secondary | ICD-10-CM

## 2017-06-05 DIAGNOSIS — I4891 Unspecified atrial fibrillation: Secondary | ICD-10-CM | POA: Diagnosis present

## 2017-06-05 DIAGNOSIS — E785 Hyperlipidemia, unspecified: Secondary | ICD-10-CM | POA: Diagnosis present

## 2017-06-05 DIAGNOSIS — I2510 Atherosclerotic heart disease of native coronary artery without angina pectoris: Secondary | ICD-10-CM | POA: Diagnosis present

## 2017-06-05 DIAGNOSIS — I509 Heart failure, unspecified: Secondary | ICD-10-CM | POA: Diagnosis present

## 2017-06-05 DIAGNOSIS — R110 Nausea: Secondary | ICD-10-CM

## 2017-06-05 DIAGNOSIS — I480 Paroxysmal atrial fibrillation: Secondary | ICD-10-CM | POA: Diagnosis present

## 2017-06-05 DIAGNOSIS — Z79899 Other long term (current) drug therapy: Secondary | ICD-10-CM

## 2017-06-05 DIAGNOSIS — Z8673 Personal history of transient ischemic attack (TIA), and cerebral infarction without residual deficits: Secondary | ICD-10-CM

## 2017-06-05 DIAGNOSIS — Z882 Allergy status to sulfonamides status: Secondary | ICD-10-CM

## 2017-06-05 DIAGNOSIS — Z951 Presence of aortocoronary bypass graft: Secondary | ICD-10-CM

## 2017-06-05 DIAGNOSIS — Z944 Liver transplant status: Secondary | ICD-10-CM

## 2017-06-05 LAB — CBC WITH DIFFERENTIAL/PLATELET
BASOS ABS: 0 10*3/uL (ref 0–0.1)
Basophils Relative: 1 %
EOS ABS: 0.1 10*3/uL (ref 0–0.7)
EOS PCT: 2 %
HCT: 32.7 % — ABNORMAL LOW (ref 35.0–47.0)
Hemoglobin: 10.8 g/dL — ABNORMAL LOW (ref 12.0–16.0)
LYMPHS ABS: 1.3 10*3/uL (ref 1.0–3.6)
Lymphocytes Relative: 26 %
MCH: 30.8 pg (ref 26.0–34.0)
MCHC: 33 g/dL (ref 32.0–36.0)
MCV: 93.1 fL (ref 80.0–100.0)
Monocytes Absolute: 0.4 10*3/uL (ref 0.2–0.9)
Monocytes Relative: 8 %
Neutro Abs: 3.2 10*3/uL (ref 1.4–6.5)
Neutrophils Relative %: 63 %
PLATELETS: 295 10*3/uL (ref 150–440)
RBC: 3.51 MIL/uL — AB (ref 3.80–5.20)
RDW: 16.9 % — ABNORMAL HIGH (ref 11.5–14.5)
WBC: 5.1 10*3/uL (ref 3.6–11.0)

## 2017-06-05 LAB — COMPREHENSIVE METABOLIC PANEL
ALT: 82 U/L — AB (ref 14–54)
ANION GAP: 7 (ref 5–15)
AST: 92 U/L — ABNORMAL HIGH (ref 15–41)
Albumin: 3.3 g/dL — ABNORMAL LOW (ref 3.5–5.0)
Alkaline Phosphatase: 110 U/L (ref 38–126)
BUN: 20 mg/dL (ref 6–20)
CHLORIDE: 95 mmol/L — AB (ref 101–111)
CO2: 25 mmol/L (ref 22–32)
CREATININE: 1.06 mg/dL — AB (ref 0.44–1.00)
Calcium: 8.5 mg/dL — ABNORMAL LOW (ref 8.9–10.3)
GFR calc non Af Amer: 44 mL/min — ABNORMAL LOW (ref >60)
GFR, EST AFRICAN AMERICAN: 52 mL/min — AB (ref >60)
Glucose, Bld: 94 mg/dL (ref 65–99)
Potassium: 4.2 mmol/L (ref 3.5–5.1)
Sodium: 127 mmol/L — ABNORMAL LOW (ref 135–145)
Total Bilirubin: 0.5 mg/dL (ref 0.3–1.2)
Total Protein: 6 g/dL — ABNORMAL LOW (ref 6.5–8.1)

## 2017-06-05 LAB — TROPONIN I
TROPONIN I: 0.41 ng/mL — AB (ref <0.03)
Troponin I: 0.37 ng/mL (ref <0.03)
Troponin I: 0.37 ng/mL (ref <0.03)

## 2017-06-05 LAB — LIPASE, BLOOD: Lipase: 38 U/L (ref 11–51)

## 2017-06-05 LAB — TSH: TSH: 5.267 u[IU]/mL — AB (ref 0.350–4.500)

## 2017-06-05 LAB — MAGNESIUM: Magnesium: 2.1 mg/dL (ref 1.7–2.4)

## 2017-06-05 MED ORDER — ACETAMINOPHEN 650 MG RE SUPP: 650.0000 mg | Freq: Four times a day (QID) | RECTAL | Status: DC | PRN

## 2017-06-05 MED ORDER — NITROGLYCERIN 0.4 MG SL SUBL: 0.4000 mg | SUBLINGUAL_TABLET | SUBLINGUAL | Status: DC | PRN

## 2017-06-05 MED ORDER — ASPIRIN EC 81 MG PO TBEC
81.0000 mg | DELAYED_RELEASE_TABLET | Freq: Every day | ORAL | Status: DC
  Administered 2017-06-06 – 2017-06-07 (×2): 81 mg via ORAL
  Filled 2017-06-05 (×2): qty 1

## 2017-06-05 MED ORDER — SODIUM CHLORIDE 0.9 % IV SOLN
INTRAVENOUS | Status: DC
  Administered 2017-06-05 – 2017-06-06 (×2): via INTRAVENOUS

## 2017-06-05 MED ORDER — METOPROLOL TARTRATE 5 MG/5ML IV SOLN
2.5000 mg | Freq: Once | INTRAVENOUS | Status: AC
  Administered 2017-06-05: 2.5 mg via INTRAVENOUS
  Filled 2017-06-05: qty 5

## 2017-06-05 MED ORDER — DOCUSATE SODIUM 100 MG PO CAPS
100.0000 mg | ORAL_CAPSULE | Freq: Every day | ORAL | Status: DC
  Administered 2017-06-06 – 2017-06-07 (×2): 100 mg via ORAL
  Filled 2017-06-05 (×2): qty 1

## 2017-06-05 MED ORDER — VALACYCLOVIR HCL 500 MG PO TABS
500.0000 mg | ORAL_TABLET | Freq: Every evening | ORAL | Status: DC
Start: 2017-06-05 — End: 2017-06-07
  Filled 2017-06-05 (×2): qty 1

## 2017-06-05 MED ORDER — POLYETHYLENE GLYCOL 3350 17 G PO PACK
17.0000 g | PACK | Freq: Every day | ORAL | Status: DC | PRN
  Administered 2017-06-06 (×2): 17 g via ORAL
  Filled 2017-06-05 (×2): qty 1

## 2017-06-05 MED ORDER — ACETAMINOPHEN 325 MG PO TABS
650.0000 mg | ORAL_TABLET | Freq: Four times a day (QID) | ORAL | Status: DC | PRN
  Administered 2017-06-05: 325 mg via ORAL
  Filled 2017-06-05 (×2): qty 2

## 2017-06-05 MED ORDER — MYCOPHENOLATE MOFETIL 250 MG PO CAPS
500.0000 mg | ORAL_CAPSULE | Freq: Two times a day (BID) | ORAL | Status: DC
  Administered 2017-06-05 – 2017-06-07 (×4): 500 mg via ORAL
  Filled 2017-06-05 (×4): qty 2

## 2017-06-05 MED ORDER — ORAL CARE MOUTH RINSE
15.0000 mL | Freq: Two times a day (BID) | OROMUCOSAL | Status: DC
  Filled 2017-06-05 (×2): qty 15

## 2017-06-05 MED ORDER — METOPROLOL TARTRATE 25 MG PO TABS
25.0000 mg | ORAL_TABLET | Freq: Four times a day (QID) | ORAL | Status: DC
  Administered 2017-06-05 – 2017-06-07 (×6): 25 mg via ORAL
  Filled 2017-06-05 (×7): qty 1

## 2017-06-05 MED ORDER — ASPIRIN 81 MG PO CHEW
324.0000 mg | CHEWABLE_TABLET | Freq: Once | ORAL | Status: AC
  Administered 2017-06-05: 324 mg via ORAL
  Filled 2017-06-05: qty 4

## 2017-06-05 MED ORDER — ADULT MULTIVITAMIN W/MINERALS CH
1.0000 | ORAL_TABLET | Freq: Every day | ORAL | Status: DC
  Administered 2017-06-06 – 2017-06-07 (×2): 1 via ORAL
  Filled 2017-06-05 (×2): qty 1

## 2017-06-05 MED ORDER — ONDANSETRON HCL 4 MG/2ML IJ SOLN: 4.0000 mg | Freq: Four times a day (QID) | INTRAMUSCULAR | Status: DC | PRN

## 2017-06-05 MED ORDER — HEPARIN SODIUM (PORCINE) 5000 UNIT/ML IJ SOLN
5000.0000 [IU] | Freq: Three times a day (TID) | INTRAMUSCULAR | Status: DC
  Administered 2017-06-05 – 2017-06-07 (×5): 5000 [IU] via SUBCUTANEOUS
  Filled 2017-06-05 (×5): qty 1

## 2017-06-05 MED ORDER — METOPROLOL TARTRATE 25 MG PO TABS: 25.0000 mg | ORAL_TABLET | Freq: Three times a day (TID) | ORAL | Status: DC

## 2017-06-05 MED ORDER — PREDNISONE 10 MG PO TABS
5.0000 mg | ORAL_TABLET | Freq: Every day | ORAL | Status: DC
  Administered 2017-06-06 – 2017-06-07 (×2): 5 mg via ORAL
  Filled 2017-06-05 (×2): qty 1

## 2017-06-05 MED ORDER — ONDANSETRON HCL 4 MG PO TABS: 4.0000 mg | ORAL_TABLET | Freq: Four times a day (QID) | ORAL | Status: DC | PRN

## 2017-06-05 NOTE — ED Provider Notes (Addendum)
[facility] Emergency Department Provider Note  ____________________________________________   I have reviewed the triage vital signs and the nursing notes. Where available I have reviewed prior notes and, if possible and indicated, outside hospital notes.    HISTORY  Chief Complaint Chest Pain    HPI Brenda Roman is a 84 y.o. female with a history of atrial fibrillation with rapid ventricular response,, mitral stenosis, CAD, has been on anticoagulations but it was stopped according to notes because of bleeding, she was in A. fib with RVR when she went to the office yesterday to see her cardiologist, they restarted her on metoprolol.  Patient states that she had a "rough night".  Unclear what that means.  Somewhat poor historian.  She does state that she is some transitory chest pain which is completely gone at this time.  She denies fever or chills.  Denies cough.  She states she just feels "a little rough".   Past Medical History:  Diagnosis Date  . Aortic stenosis   . 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 Cancer ([place])    skin  . CHF (congestive heart failure) ([place])   . Coronary artery disease   . CVA (cerebral infarction) June '16  . Hypertension   . Liver transplanted ([place])   . Macular degeneration   . Mitral valve disease   . PBC (primary biliary cirrhosis)    s/p liver transplant  . Shingles   . TIA (transient ischemic attack)     Patient Active Problem List   Diagnosis Date Noted  . Dizziness 04/23/2017  . Diarrhea 04/23/2017  . UTI (urinary tract infection) 04/23/2017  . Acute diverticulitis 11/23/2016  . Arrhythmia, ventricular 09/19/2015  . Hyponatremia 07/30/2015  . Acute CHF (congestive heart failure) ([place]) 07/18/2015  . 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 Closed wedge fracture of lumbar vertebra ([place]) 10/11/2014  . Ischemic stroke ([place]) 10/07/2014  . Acute lower UTI 10/07/2014  . 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 Closed fracture  of humerus, surgical neck 09/29/2014  . Goals of care, counseling/discussion   . Acute encephalopathy 08/29/2014  . Hypoxia 08/29/2014  . Healthcare-associated pneumonia 08/29/2014  . Pneumonia 08/29/2014  . CVA (cerebral infarction) 08/23/2014  . Arteriosclerosis of coronary artery 03/15/2014  . Paroxysmal atrial fibrillation ([place]) 03/15/2014  . Atherosclerosis of abdominal aorta ([place]) 01/27/2014  . HLD (hyperlipidemia) 04/07/2012  . BP (high blood pressure) 04/07/2012  . History of liver transplant ([place]) 04/03/2012  . Encounter for other procedures for purposes other than remedying health state 04/03/2012  . Prolapse of urethra 03/10/2012  . Pelvic relaxation due to uterovaginal prolapse 03/10/2012    Past Surgical History:  Procedure Laterality Date  . BOWEL RESECTION    . COLONOSCOPY    . CORONARY ARTERY BYPASS GRAFT    . JOINT REPLACEMENT    . LIVER TRANSPLANT  1990    Prior to Admission medications   Medication Sig Start Date End Date Taking? Authorizing Provider  acetaminophen (TYLENOL) 325 MG tablet Take 2 tablets by mouth every 6 (six) hours as needed. Reported on 10/26/2015    [provider]  aspirin EC 81 MG tablet Take 81 mg by mouth daily.    [provider]  cyclobenzaprine (FLEXERIL) 5 MG tablet Take 1 tablet (5 mg total) by mouth 3 (three) times daily as needed for muscle spasms. Patient not taking: Reported on 04/23/2017 01/30/17   Rudene Re, MD  docusate sodium (COLACE) 50 MG capsule Take  100 mg by mouth daily.    [provider]  gabapentin (NEURONTIN) 100 MG capsule Take 100 mg by mouth as needed.  04/03/17   [provider]  lidocaine (LIDODERM) 5 % Place 1 patch onto the skin every 12 (twelve) hours. Remove & Discard patch within 12 hours or as directed by MD 01/30/17 01/30/18  Alfred Levins, [place], MD  methenamine (HIPREX) 1 g tablet Take 1 tablet (1 g total) by mouth 2 (two) times daily with a meal. Patient not  taking: Reported on 09/16/2016 10/26/15   Zara Council A, PA-C  mycophenolate (CELLCEPT) 500 MG tablet Take 500 mg by mouth every 12 (twelve) hours.    [provider]  nitroGLYCERIN (NITROSTAT) 0.4 MG SL tablet Place 1 tablet under the tongue as needed. Reported on 10/26/2015 06/20/15   [provider]  predniSONE (DELTASONE) 5 MG tablet Take 5 mg by mouth daily.    [provider]  valACYclovir (VALTREX) 500 MG tablet Take 500 mg by mouth every evening.    [provider]    Allergies Cephalexin; Novocain [procaine]; and Sulfa antibiotics  Family History  Problem Relation Age of Onset  . Hypertension Father     Social History Social History   Tobacco Use  . Smoking status: Former Research scientist (life sciences)  . Smokeless tobacco: Never Used  Substance Use Topics  . Alcohol use: No  . Drug use: No    Review of Systems Constitutional: No fever/chills Eyes: No visual changes. ENT: No sore throat. No stiff neck no neck pain Cardiovascular: Did have some chest pain at home Respiratory: Denies shortness of breath. Gastrointestinal:   no vomiting.  No diarrhea.  No constipation. Genitourinary: Negative for dysuria. Musculoskeletal: Negative lower extremity swelling Skin: Negative for rash. Neurological: Negative for severe headaches, focal weakness or numbness.   ____________________________________________   PHYSICAL EXAM:  VITAL SIGNS: ED Triage Vitals [06/05/17 0926]  Enc Vitals Group     BP (!) 130/96     Pulse Rate (!) 115     Resp 16     Temp 97.8 F (36.6 C)     Temp Source Oral     SpO2 96 %     Weight 104 lb (47.2 kg)     Height 5\' 2"  (1.575 m)     Head Circumference      Peak Flow      Pain Score      Pain Loc      Pain Edu?      Excl. in [place]?     Constitutional: Alert and oriented. Well appearing and in no acute distress. Eyes: Conjunctivae are normal Head: Atraumatic HEENT: No congestion/rhinnorhea. Mucous membranes are moist.   Oropharynx non-erythematous Neck:   Nontender with no meningismus, no masses, no stridor Cardiovascular: Regularly irregular, rate 102. Grossly normal heart sounds.  Good peripheral circulation. Respiratory: Normal respiratory effort.  No retractions. Lungs CTAB. Abdominal: Soft and nontender. No distention. No guarding no rebound Back:  There is no focal tenderness or step off.  there is no midline tenderness there are no lesions noted. there is no CVA tenderness Musculoskeletal: No lower extremity tenderness, no upper extremity tenderness. No joint effusions, no DVT signs strong distal pulses no edema Neurologic:  Normal speech and language. No gross focal neurologic deficits are appreciated.  Skin:  Skin is warm, dry and intact. No rash noted. Psychiatric: Mood and affect are normal. Speech and behavior are normal.  ____________________________________________   LABS (all labs ordered are  listed, but only abnormal results are displayed)  Labs Reviewed  CBC WITH DIFFERENTIAL/PLATELET - Abnormal; Notable for the following components:      Result Value   RBC 3.51 (*)    Hemoglobin 10.8 (*)    HCT 32.7 (*)    RDW 16.9 (*)    All other components within normal limits  COMPREHENSIVE METABOLIC PANEL - Abnormal; Notable for the following components:   Sodium 127 (*)    Chloride 95 (*)    Creatinine, Ser 1.06 (*)    Calcium 8.5 (*)    Total Protein 6.0 (*)    Albumin 3.3 (*)    AST 92 (*)    ALT 82 (*)    GFR calc non Af Amer 44 (*)    GFR calc Af Amer 52 (*)    All other components within normal limits  TROPONIN I - Abnormal; Notable for the following components:   Troponin I 0.41 (*)    All other components within normal limits  TSH - Abnormal; Notable for the following components:   TSH 5.267 (*)    All other components within normal limits  LIPASE, BLOOD    Pertinent labs  results that were available during my care of the patient were reviewed by me and considered in my  medical decision making (see chart for details). ____________________________________________  EKG  I personally interpreted any EKGs ordered by me or triage EKG shows left bundle branch block, atrial fibrillation, gust with cardiology, Dr. Carollee Sires, they do not feel that this represents acute ischemia ____________________________________________  RADIOLOGY  Pertinent labs & imaging results that were available during my care of the patient were reviewed by me and considered in my medical decision making (see chart for details). If possible, patient and/or family made aware of any abnormal findings.  Dg Chest Port 1 View  Result Date: 06/05/2017 CLINICAL DATA:  Nausea, history CHF, coronary artery disease, stroke, hypertension EXAM: PORTABLE CHEST 1 VIEW COMPARISON:  Portable exam 0932 hours compared to 01/30/2017 FINDINGS: Enlargement of cardiac silhouette with pulmonary vascular congestion. Postsurgical changes of CABG. Atherosclerotic calcification aorta. Slightly increased perihilar markings versus prior exam question mild pulmonary edema superimposed upon a background of COPD/chronic bronchitis. No definite pleural effusion or pneumothorax. Bones demineralized. IMPRESSION: Enlargement of cardiac silhouette with pulmonary vascular congestion and question mild pulmonary edema. Underlying COPD/chronic bronchitis. Electronically Signed   By: Lavonia Dana M.D.   On: 06/05/2017 09:52   ____________________________________________    PROCEDURES  Procedure(s) performed: None  Procedures  Critical Care performed: CRITICAL CARE Performed by: Schuyler Amor   Total critical care time: 42 minutes  Critical care time was exclusive of separately billable procedures and treating other patients.  Critical care was necessary to treat or prevent imminent or life-threatening deterioration.  Critical care was time spent personally by me on the following activities: development of treatment plan  with patient and/or surrogate as well as nursing, discussions with consultants, evaluation of patient's response to treatment, examination of patient, obtaining history from patient or surrogate, ordering and performing treatments and interventions, ordering and review of laboratory studies, ordering and review of radiographic studies, pulse oximetry and re-evaluation of patient's condition.   ____________________________________________   INITIAL IMPRESSION / ASSESSMENT AND PLAN / ED COURSE  Pertinent labs & imaging results that were available during my care of the patient were reviewed by me and considered in my medical decision making (see chart for details).  Patient with A. fib with RVR doctor's office  yesterday took metoprolol yesterday and this morning, rate is better controlled at this time she is not having any chest pain although apparently she did have some chest pain overnight.  Troponin is 0.4, we did discuss with cardiology they do not feel any other further intervention is acutely indicated after evaluating EKG.  They do agree with aspirin and admission.  Patient is not having chest pain here.  We will admit her to the hospital.    ____________________________________________   FINAL CLINICAL IMPRESSION(S) / ED DIAGNOSES  Final diagnoses:  Nausea      This chart was dictated using voice recognition software.  Despite best efforts to proofread,  errors can occur which can change meaning.      Schuyler Amor, MD 06/05/17 1058    Schuyler Amor, MD 06/05/17 1100

## 2017-06-05 NOTE — ED Notes (Signed)
Pt taken to floor via stretcher. VSS. NAD. Report called to [place]. All questions answered.

## 2017-06-05 NOTE — Consult Note (Signed)
[facility] Cardiology  CARDIOLOGY CONSULT NOTE  Patient ID: Brenda Roman MRN: [id number] DOB/AGE: 12-25-1923 84 y.o.  Admit date: 06/05/2017 Referring Physician Verdell Carmine Primary Physician [facility] Primary Cardiologist Fath Reason for Consultation atrial fibrillation  HPI: 84 year old female referred for evaluation of atrial fibrillation, chest pain, and borderline elevated troponin.  Patient has no history of atrial fibrillation, saw her cardiologist yesterday, at which time she was noted to be in atrial fibrillation with a rapid ventricular rate, and metoprololdose was adjusted.  The patient was hesitant to be admitted.  She went home, only to return to [facility] emergency room today with intermittent left-sided flank discomfort, ECG revealed atrial fibrillation with a rapid ventricular rate, with intraventricular conduction delay, with borderline elevated troponin of 0.41.  The patient currently denies substernal chest pain, and reports feeling better.  Review of systems complete and found to be negative unless listed above     Past Medical History:  Diagnosis Date  . Aortic stenosis   . 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 Cancer ([place])    skin  . CHF (congestive heart failure) ([place])   . Coronary artery disease   . CVA (cerebral infarction) June '16  . Hypertension   . Liver transplanted ([place])   . Macular degeneration   . Mitral valve disease   . PBC (primary biliary cirrhosis)    s/p liver transplant  . Shingles   . TIA (transient ischemic attack)     Past Surgical History:  Procedure Laterality Date  . BOWEL RESECTION    . COLONOSCOPY    . CORONARY ARTERY BYPASS GRAFT    . JOINT REPLACEMENT    . LIVER TRANSPLANT  1990     (Not in a hospital admission) Social History   Socioeconomic History  . Marital status: Married    Spouse name: Not on file  . Number of children: Not on file  . Years of education: Not on file  . Highest education level: Not on file  Social Needs  . Financial  resource strain: Not on file  . Food insecurity - worry: Not on file  . Food insecurity - inability: Not on file  . Transportation needs - medical: Not on file  . Transportation needs - non-medical: Not on file  Occupational History  . Not on file  Tobacco Use  . Smoking status: Former Research scientist (life sciences)  . Smokeless tobacco: Never Used  Substance and Sexual Activity  . Alcohol use: No  . Drug use: No  . Sexual activity: No  Other Topics Concern  . Not on file  Social History Narrative  . Not on file    Family History  Problem Relation Age of Onset  . Hypertension Father   . Heart attack Father   . Stroke Mother       Review of systems complete and found to be negative unless listed above      PHYSICAL EXAM  General: Well developed, well nourished, in no acute distress HEENT:  Normocephalic and atramatic Neck:  No JVD.  Lungs: Clear bilaterally to auscultation and percussion. Heart: HRRR . Normal S1 and S2 without gallops or murmurs.  Abdomen: Bowel sounds are positive, abdomen soft and non-tender  Msk:  Back normal, normal gait. Normal strength and tone for age. Extremities: No clubbing, cyanosis or edema.   Neuro: Alert and oriented X 3. Psych:  Good affect, responds appropriately  Labs:   Lab Results  Component Value Date   WBC 5.1 06/05/2017  HGB 10.8 (L) 06/05/2017   HCT 32.7 (L) 06/05/2017   MCV 93.1 06/05/2017   PLT 295 06/05/2017   Recent Labs  Lab 06/05/17 0932  NA 127*  K 4.2  CL 95*  CO2 25  BUN 20  CREATININE 1.06*  CALCIUM 8.5*  PROT 6.0*  BILITOT 0.5  ALKPHOS 110  ALT 82*  AST 92*  GLUCOSE 94   Lab Results  Component Value Date   CKTOTAL 71 12/26/2012   CKMB 2.2 12/26/2012   TROPONINI 0.41 (HH) 06/05/2017    Lab Results  Component Value Date   CHOL 132 09/07/2014   CHOL 157 08/23/2014   CHOL 150 12/27/2012   Lab Results  Component Value Date   HDL 25 (L) 09/07/2014   HDL 30 (L) 08/23/2014   HDL 44 12/27/2012   Lab Results   Component Value Date   LDLCALC 80 09/07/2014   LDLCALC 107 (H) 08/23/2014   LDLCALC 80 12/27/2012   Lab Results  Component Value Date   TRIG 134 09/07/2014   TRIG 99 08/23/2014   TRIG 129 12/27/2012   Lab Results  Component Value Date   CHOLHDL 5.3 09/07/2014   CHOLHDL 5.2 08/23/2014   No results found for: LDLDIRECT    Radiology: Dg Chest Port 1 View  Result Date: 06/05/2017 CLINICAL DATA:  Nausea, history CHF, coronary artery disease, stroke, hypertension EXAM: PORTABLE CHEST 1 VIEW COMPARISON:  Portable exam 0932 hours compared to 01/30/2017 FINDINGS: Enlargement of cardiac silhouette with pulmonary vascular congestion. Postsurgical changes of CABG. Atherosclerotic calcification aorta. Slightly increased perihilar markings versus prior exam question mild pulmonary edema superimposed upon a background of COPD/chronic bronchitis. No definite pleural effusion or pneumothorax. Bones demineralized. IMPRESSION: Enlargement of cardiac silhouette with pulmonary vascular congestion and question mild pulmonary edema. Underlying COPD/chronic bronchitis. Electronically Signed   By: Lavonia Dana M.D.   On: 06/05/2017 09:52    EKG: Atrial fibrillation with intraventricular conduction delay  ASSESSMENT AND PLAN:   1.  Atrial fibrillation with rapid ventricular response 2.  Chest pain with atypical features 3.  Borderline elevated troponin, likely due to demand supply ischemia 4.  Known CAD, status post CABG 5.  History of GI bleed on Xarelto 6.  Aortic stenosis  Recommendations  1.  Agree with overall plan 2.  Continue metoprolol tartrate 25 mg p.o. every 6 hours 3.  Defer for this anticoagulation in light of recent history of GI bleed 4.  Defer emergent cardiac catheterization 5.  Further recommendations pending patient's clinical course   Signed: Isaias Cowman MD,PhD, [facility] 06/05/2017, 1:41 PM

## 2017-06-05 NOTE — H&P (Signed)
[facility] at [place] NAME: Brenda Roman    MR#:  [id number]  DATE OF BIRTH:  June 27, 1923  DATE OF ADMISSION:  06/05/2017  PRIMARY CARE PHYSICIAN: Kirk Ruths, MD   REQUESTING/REFERRING PHYSICIAN: Dr. Charlotte Crumb  CHIEF COMPLAINT:   Chief Complaint  Patient presents with  . Chest Pain    HISTORY OF PRESENT ILLNESS:  Brenda Roman  is a 84 y.o. female with a known history of essential hypertension, primary biliary cirrhosis status post liver transplant, history of previous TIA, aortic stenosis who presents to the hospital due to chest pain, nausea, dizziness. Patient apparently was seen by her cardiologist last week due to atrial fibrillation with rapid ventricular response and her metoprolol dose was adjusted. Last night she complained of some vague chest pain in the left side of her chest radiating to her neck associated with some dizziness and some nausea but no vomiting. Patient is not the best historian therefore most of the history obtained from the husband and daughter at bedside. Patient was brought to the ER for further evaluation and was noted to have elevated troponin I 0.41. Hospitalist services were contacted further treatment and evaluation.  PAST MEDICAL HISTORY:   Past Medical History:  Diagnosis Date  . Aortic stenosis   . 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 Cancer ([place])    skin  . CHF (congestive heart failure) ([place])   . Coronary artery disease   . CVA (cerebral infarction) June '16  . Hypertension   . Liver transplanted ([place])   . Macular degeneration   . Mitral valve disease   . PBC (primary biliary cirrhosis)    s/p liver transplant  . Shingles   . TIA (transient ischemic attack)     PAST SURGICAL HISTORY:   Past Surgical History:  Procedure Laterality Date  . BOWEL RESECTION    . COLONOSCOPY    . CORONARY ARTERY BYPASS GRAFT    . JOINT REPLACEMENT    . LIVER TRANSPLANT  1990    SOCIAL HISTORY:    Social History   Tobacco Use  . Smoking status: Former Research scientist (life sciences)  . Smokeless tobacco: Never Used  Substance Use Topics  . Alcohol use: No    FAMILY HISTORY:   Family History  Problem Relation Age of Onset  . Hypertension Father   . Heart attack Father   . Stroke Mother     DRUG ALLERGIES:   Allergies  Allergen Reactions  . Cephalexin Other (See Comments)    Reaction:  Unknown  . Novocain [Procaine] Rash  . Sulfa Antibiotics Rash    REVIEW OF SYSTEMS:   Review of Systems  Constitutional: Negative for chills, fever and weight loss.  HENT: Negative for congestion, nosebleeds and tinnitus.   Eyes: Negative for blurred vision, double vision and redness.  Respiratory: Negative for cough, hemoptysis, shortness of breath and wheezing.   Cardiovascular: Positive for chest pain. Negative for orthopnea, leg swelling and PND.  Gastrointestinal: Positive for nausea. Negative for abdominal pain, diarrhea, melena and vomiting.  Genitourinary: Negative for dysuria, hematuria and urgency.  Musculoskeletal: Negative for falls and joint pain.  Neurological: Positive for dizziness. Negative for tingling, sensory change, focal weakness, seizures, weakness and headaches.  Endo/Heme/Allergies: Negative for polydipsia. Does not bruise/bleed easily.  Psychiatric/Behavioral: Negative for depression and memory loss. The patient is not nervous/anxious.   All other systems reviewed and are negative.   MEDICATIONS AT HOME:   Prior to  Admission medications   Medication Sig Start Date End Date Taking? Authorizing Provider  acetaminophen (TYLENOL) 325 MG tablet Take 2 tablets by mouth every 6 (six) hours as needed. Reported on 10/26/2015   Yes [provider]  aspirin EC 81 MG tablet Take 81 mg by mouth daily.   Yes [provider]  docusate sodium (COLACE) 50 MG capsule Take 100 mg by mouth daily.   Yes [provider]  metoprolol tartrate (LOPRESSOR) 25 MG tablet Take  25 mg by mouth 3 (three) times daily.   Yes [provider]  Multiple Vitamins-Minerals (MULTIVITAMIN WITH MINERALS) tablet Take 1 tablet by mouth daily.   Yes [provider]  mycophenolate (CELLCEPT) 500 MG tablet Take 500 mg by mouth every 12 (twelve) hours.   Yes [provider]  nitroGLYCERIN (NITROSTAT) 0.4 MG SL tablet Place 1 tablet under the tongue as needed. Reported on 10/26/2015 06/20/15  Yes [provider]  polyethylene glycol (MIRALAX / GLYCOLAX) packet Take 17 g by mouth daily as needed.   Yes [provider]  predniSONE (DELTASONE) 5 MG tablet Take 5 mg by mouth daily.   Yes [provider]  valACYclovir (VALTREX) 500 MG tablet Take 500 mg by mouth every evening.   Yes [provider]  cyclobenzaprine (FLEXERIL) 5 MG tablet Take 1 tablet (5 mg total) by mouth 3 (three) times daily as needed for muscle spasms. Patient not taking: Reported on 04/23/2017 01/30/17   Rudene Re, MD  lidocaine (LIDODERM) 5 % Place 1 patch onto the skin every 12 (twelve) hours. Remove & Discard patch within 12 hours or as directed by MD Patient not taking: Reported on 06/05/2017 01/30/17 01/30/18  Rudene Re, MD  methenamine (HIPREX) 1 g tablet Take 1 tablet (1 g total) by mouth 2 (two) times daily with a meal. Patient not taking: Reported on 09/16/2016 10/26/15   Zara Council A, PA-C      VITAL SIGNS:  Blood pressure 106/68, pulse 98, temperature 97.8 F (36.6 C), temperature source Oral, resp. rate 15, height 5\' 2"  (1.575 m), weight 47.2 kg (104 lb), SpO2 97 %.  PHYSICAL EXAMINATION:  Physical Exam  GENERAL:  84 y.o.-year-old patient lying in the bed in no acute distress.  EYES: pt. Is blind in both eyes  HEENT: Head atraumatic, normocephalic. Oropharynx and nasopharynx clear. No oropharyngeal erythema, moist oral mucosa  NECK:  Supple, no jugular venous distention. No thyroid enlargement, no tenderness.  LUNGS: Normal  breath sounds bilaterally, no wheezing, rales, rhonchi. No use of accessory muscles of respiration.  CARDIOVASCULAR: S1, S2 Irregular. No murmurs, rubs, gallops, clicks.  ABDOMEN: Soft, nontender, nondistended. Bowel sounds present. No organomegaly or mass.  EXTREMITIES: No pedal edema, cyanosis, or clubbing. + 2 pedal & radial pulses b/l.   NEUROLOGIC: Cranial nerves II through XII are intact. No focal Motor or sensory deficits appreciated b/l PSYCHIATRIC: The patient is alert and oriented x 3. SKIN: No obvious rash, lesion, or ulcer.   LABORATORY PANEL:   CBC Recent Labs  Lab 06/05/17 0932  WBC 5.1  HGB 10.8*  HCT 32.7*  PLT 295   ------------------------------------------------------------------------------------------------------------------  Chemistries  Recent Labs  Lab 06/05/17 0932  NA 127*  K 4.2  CL 95*  CO2 25  GLUCOSE 94  BUN 20  CREATININE 1.06*  CALCIUM 8.5*  AST 92*  ALT 82*  ALKPHOS 110  BILITOT 0.5   ------------------------------------------------------------------------------------------------------------------  Cardiac Enzymes Recent Labs  Lab 06/05/17 0932  TROPONINI 0.41*   ------------------------------------------------------------------------------------------------------------------  RADIOLOGY:  Dg Chest Port 1 View  Result Date: 06/05/2017 CLINICAL DATA:  Nausea, history CHF, coronary artery disease, stroke, hypertension EXAM: PORTABLE CHEST 1 VIEW COMPARISON:  Portable exam 0932 hours compared to 01/30/2017 FINDINGS: Enlargement of cardiac silhouette with pulmonary vascular congestion. Postsurgical changes of CABG. Atherosclerotic calcification aorta. Slightly increased perihilar markings versus prior exam question mild pulmonary edema superimposed upon a background of COPD/chronic bronchitis. No definite pleural effusion or pneumothorax. Bones demineralized. IMPRESSION: Enlargement of cardiac silhouette with pulmonary vascular congestion  and question mild pulmonary edema. Underlying COPD/chronic bronchitis. Electronically Signed   By: Lavonia Dana M.D.   On: 06/05/2017 09:52     IMPRESSION AND PLAN:   84 year old female with past medical history of primary biliary cirrhosis status post liver transplant, hypertension, chronic atrial fibrillation, aortic stenosis, previous TIA/CVA who presented to the hospital due to vague chest pain, nausea and dizziness and noted to have an elevated troponin.  1. Chest pain/elevated troponin-patient does have risk factors given advanced age, previous history of coronary disease, CHF and now atrial fibrillation.  -Her symptoms although very atypical for angina. Her first troponin is mildly elevated 0.41. EKG shows PVCs, atrial fibrillation but no acute ST or T-wave changes. We'll observe her overnight on telemetry, cycle her cardiac markers. -Continue aspirin, beta blocker, nitroglycerin. We'll get a cardiology consult, check an echocardiogram.  2. History of chronic atrial fibrillation-continue metoprolol. -Patient is not on long-term anticoagulation given previous history of GI bleed.  3. History of primary biliary cirrhosis status post liver transplant-continue CellCept, prednisone.  4. Essential hypertension-continue metoprolol.  5. History of previous TIA/CVA-continue aspirin.    All the records are reviewed and case discussed with ED provider. Management plans discussed with the patient, family and they are in agreement.  CODE STATUS: DO NOT RESUSCITATE  TOTAL TIME TAKING CARE OF THIS PATIENT: 40 minutes.    Henreitta Leber M.D on 06/05/2017 at 12:16 PM  Between 7am to 6pm - Pager - [phone]  After 6pm go to [email or web address] - password [email or web address] [facility]  [facility]  Office  [phone]  CC: Primary care physician; Kirk Ruths, MD

## 2017-06-06 DIAGNOSIS — I110 Hypertensive heart disease with heart failure: Secondary | ICD-10-CM | POA: Diagnosis present

## 2017-06-06 DIAGNOSIS — I482 Chronic atrial fibrillation: Secondary | ICD-10-CM | POA: Diagnosis present

## 2017-06-06 DIAGNOSIS — Z87891 Personal history of nicotine dependence: Secondary | ICD-10-CM | POA: Diagnosis not present

## 2017-06-06 DIAGNOSIS — H3530 Unspecified macular degeneration: Secondary | ICD-10-CM | POA: Diagnosis present

## 2017-06-06 DIAGNOSIS — Z883 Allergy status to other anti-infective agents status: Secondary | ICD-10-CM | POA: Diagnosis not present

## 2017-06-06 DIAGNOSIS — Z79899 Other long term (current) drug therapy: Secondary | ICD-10-CM | POA: Diagnosis not present

## 2017-06-06 DIAGNOSIS — I480 Paroxysmal atrial fibrillation: Secondary | ICD-10-CM | POA: Diagnosis present

## 2017-06-06 DIAGNOSIS — I080 Rheumatic disorders of both mitral and aortic valves: Secondary | ICD-10-CM | POA: Diagnosis present

## 2017-06-06 DIAGNOSIS — Z66 Do not resuscitate: Secondary | ICD-10-CM | POA: Diagnosis present

## 2017-06-06 DIAGNOSIS — I509 Heart failure, unspecified: Secondary | ICD-10-CM | POA: Diagnosis present

## 2017-06-06 DIAGNOSIS — Z944 Liver transplant status: Secondary | ICD-10-CM | POA: Diagnosis not present

## 2017-06-06 DIAGNOSIS — E785 Hyperlipidemia, unspecified: Secondary | ICD-10-CM | POA: Diagnosis present

## 2017-06-06 DIAGNOSIS — I2510 Atherosclerotic heart disease of native coronary artery without angina pectoris: Secondary | ICD-10-CM | POA: Diagnosis present

## 2017-06-06 DIAGNOSIS — Z7952 Long term (current) use of systemic steroids: Secondary | ICD-10-CM | POA: Diagnosis not present

## 2017-06-06 DIAGNOSIS — Z8673 Personal history of transient ischemic attack (TIA), and cerebral infarction without residual deficits: Secondary | ICD-10-CM | POA: Diagnosis not present

## 2017-06-06 DIAGNOSIS — I4891 Unspecified atrial fibrillation: Secondary | ICD-10-CM | POA: Diagnosis present

## 2017-06-06 DIAGNOSIS — R110 Nausea: Secondary | ICD-10-CM | POA: Diagnosis present

## 2017-06-06 DIAGNOSIS — Z8249 Family history of ischemic heart disease and other diseases of the circulatory system: Secondary | ICD-10-CM | POA: Diagnosis not present

## 2017-06-06 DIAGNOSIS — Z882 Allergy status to sulfonamides status: Secondary | ICD-10-CM | POA: Diagnosis not present

## 2017-06-06 DIAGNOSIS — Z951 Presence of aortocoronary bypass graft: Secondary | ICD-10-CM | POA: Diagnosis not present

## 2017-06-06 DIAGNOSIS — Z7982 Long term (current) use of aspirin: Secondary | ICD-10-CM | POA: Diagnosis not present

## 2017-06-06 DIAGNOSIS — I700 Atherosclerosis of aorta: Secondary | ICD-10-CM | POA: Diagnosis present

## 2017-06-06 DIAGNOSIS — Z884 Allergy status to anesthetic agent status: Secondary | ICD-10-CM | POA: Diagnosis not present

## 2017-06-06 LAB — BASIC METABOLIC PANEL
Anion gap: 8 (ref 5–15)
BUN: 22 mg/dL — AB (ref 6–20)
CHLORIDE: 100 mmol/L — AB (ref 101–111)
CO2: 21 mmol/L — AB (ref 22–32)
CREATININE: 0.89 mg/dL (ref 0.44–1.00)
Calcium: 8.1 mg/dL — ABNORMAL LOW (ref 8.9–10.3)
GFR calc Af Amer: >60 mL/min (ref >60)
GFR calc non Af Amer: 55 mL/min — ABNORMAL LOW (ref >60)
Glucose, Bld: 96 mg/dL (ref 65–99)
Potassium: 4.6 mmol/L (ref 3.5–5.1)
Sodium: 129 mmol/L — ABNORMAL LOW (ref 135–145)

## 2017-06-06 LAB — CBC
HEMATOCRIT: 32.6 % — AB (ref 35.0–47.0)
Hemoglobin: 10.6 g/dL — ABNORMAL LOW (ref 12.0–16.0)
MCH: 30.4 pg (ref 26.0–34.0)
MCHC: 32.5 g/dL (ref 32.0–36.0)
MCV: 93.5 fL (ref 80.0–100.0)
PLATELETS: 266 10*3/uL (ref 150–440)
RBC: 3.49 MIL/uL — ABNORMAL LOW (ref 3.80–5.20)
RDW: 16.8 % — ABNORMAL HIGH (ref 11.5–14.5)
WBC: 5.6 10*3/uL (ref 3.6–11.0)

## 2017-06-06 LAB — TROPONIN I: Troponin I: 0.33 ng/mL (ref <0.03)

## 2017-06-06 MED ORDER — ACETAMINOPHEN 325 MG PO TABS
325.0000 mg | ORAL_TABLET | Freq: Four times a day (QID) | ORAL | Status: DC | PRN
  Administered 2017-06-06: 325 mg via ORAL

## 2017-06-06 MED ORDER — LORAZEPAM 2 MG/ML IJ SOLN: 0.5000 mg | Freq: Four times a day (QID) | INTRAMUSCULAR | Status: DC | PRN

## 2017-06-06 MED ORDER — METOPROLOL TARTRATE 5 MG/5ML IV SOLN
5.0000 mg | Freq: Once | INTRAVENOUS | Status: AC
  Administered 2017-06-06: 5 mg via INTRAVENOUS
  Filled 2017-06-06: qty 5

## 2017-06-06 MED ORDER — MELATONIN 5 MG PO TABS
5.0000 mg | ORAL_TABLET | Freq: Every day | ORAL | Status: DC
  Filled 2017-06-06: qty 1

## 2017-06-06 MED ORDER — MELATONIN 5 MG PO TABS: 5.0000 mg | ORAL_TABLET | Freq: Every day | ORAL | Status: DC

## 2017-06-06 MED ORDER — MELATONIN 5 MG PO TABS
5.0000 mg | ORAL_TABLET | Freq: Every day | ORAL | Status: DC
  Administered 2017-06-06 (×2): 5 mg via ORAL
  Filled 2017-06-06 (×2): qty 1

## 2017-06-06 MED ORDER — LORAZEPAM 2 MG/ML IJ SOLN
0.5000 mg | Freq: Four times a day (QID) | INTRAMUSCULAR | Status: DC
Start: 2017-06-06 — End: 2017-06-06
  Administered 2017-06-06: 0.5 mg via INTRAVENOUS
  Filled 2017-06-06 (×2): qty 1

## 2017-06-06 NOTE — Progress Notes (Signed)
Family Meeting Note  Advance Directive:yes  Today a meeting took place with the Patient and Husband, Daughter, son at bedside.  Patient is unable to participate due SL:HTDSKA capacity LETHARGIC   The following clinical team members were present during this meeting:MD  The following were discussed:Patient's diagnosis:    84 year old female with past medical history of primary biliary cirrhosis status post liver transplant, hypertension, chronic atrial fibrillation, aortic stenosis, previous TIA/CVA admitted due to vague chest pain, nausea and dizziness and noted to have an elevated troponin.  Patient's progosis: < 6 months and Goals for treatment: DNR  Additional follow-up to be provided: Home with Hospice - family in agreement, patient was under hospice 3 years ago  Time spent during discussion:20 minutes  Max Sane, MD

## 2017-06-06 NOTE — Progress Notes (Signed)
New referral for [facility] services at home received from [facility]. Patient is a 84 year old woman with a past medical history of essential hypertension, primary biliary cirrhosis status post liver transplant, history of previous TIA, and aortic stenosis admitted to [facility] for evaluation of chest pain, found to have an elevated troponin and new onset A fib. Echo results pending. Patient has remained weak and family wishes to return home with the support of hospice services. Writer met in the room with patient and her husband, patient appeared to be sleeping soundly and could not be awakened for the discussion. Mr. Rickerson is familiar with hospice services as patient has had services in the past. Hospice information and contact numbers left with Mr.lMiller. Patient information faxed to referral. Hospital care team updated. Patient may need EMS transport home as she is unable to ambulate at this time. Hospital care team updated. Flo Shanks RN, BSN, [facility], hospital liaison [phone]

## 2017-06-06 NOTE — Care Management Obs Status (Signed)
[facility] NOTIFICATION   Patient Details  Name: Brenda Roman MRN: [id number] Date of Birth: 03-03-1924   Medicare Observation Status Notification Given:  Yes    Katrina Stack, RN 06/06/2017, 9:19 AM

## 2017-06-06 NOTE — Progress Notes (Signed)
Pt Hr continues to fluctuate between low 100's up to the 140's. MD notified. Orders placed. Will continue to monitor and assess.

## 2017-06-06 NOTE — Progress Notes (Signed)
Pt with A-Fib/ RVR HR up to 150's at times nonsustaining. Notified MD. Orders placed. Will continue to monitor and assess.

## 2017-06-06 NOTE — Progress Notes (Signed)
[facility] Card[facility]  SUBJECTIVE: The patient complains of lower abdominal discomfort throughout the night and this morning and states she feels like she needs to have a bowel movement. She denies chest pain. She has chronic shortness of breath with any exertional activity. She denies palpitations or heart racing.   Vitals:   06/05/17 1659 06/05/17 1724 06/06/17 0341 06/06/17 0756  BP:  113/88 (!) 120/98 111/71  Pulse: (!) 107 (!) 42 (!) 111 63  Resp: 18  17 12   Temp:  (!) 97.5 F (36.4 C) 97.8 F (36.6 C) 98 F (36.7 C)  TempSrc:  Oral Oral Oral  SpO2: 100% 95% 93% 97%  Weight:      Height:         Intake/Output Summary (Last 24 hours) at 06/06/2017 0843 Last data filed at 06/06/2017 0300 Gross per 24 hour  Intake 600 ml  Output -  Net 600 ml      PHYSICAL EXAM  General: Frail elderly lady in sitting up at a angle in bed, applying make-up, in no acute distress HEENT:  Normocephalic and atramatic Neck:  No JVD.  Lungs: Normal effort of breathing on room air, no accessory respiratory muscle use Heart: Regular rate and rhythm, 3/6 systolic murmur  Abdomen: Nondistended Msk:  Decreased strength throughout, especially right extremities Extremities: No clubbing, cyanosis or edema.   Neuro: Alert and oriented X 3. Psych:  Good affect, responds appropriately   LABS: Basic Metabolic Panel: Recent Labs    06/05/17 0932 06/05/17 2123 06/06/17 0134  NA 127*  --  129*  K 4.2  --  4.6  CL 95*  --  100*  CO2 25  --  21*  GLUCOSE 94  --  96  BUN 20  --  22*  CREATININE 1.06*  --  0.89  CALCIUM 8.5*  --  8.1*  MG  --  2.1  --    Liver Function Tests: Recent Labs    06/05/17 0932  AST 92*  ALT 82*  ALKPHOS 110  BILITOT 0.5  PROT 6.0*  ALBUMIN 3.3*   Recent Labs    06/05/17 0932  LIPASE 38   CBC: Recent Labs    06/05/17 0932 06/06/17 0134  WBC 5.1 5.6  NEUTROABS 3.2  --   HGB 10.8* 10.6*  HCT 32.7* 32.6*  MCV 93.1 93.5  PLT 295 266   Cardiac  Enzymes: Recent Labs    06/05/17 1850 06/05/17 2123 06/06/17 0134  TROPONINI 0.37* 0.37* 0.33*   BNP: Invalid input(s): POCBNP D-Dimer: No results for input(s): DDIMER in the last 72 hours. Hemoglobin A1C: No results for input(s): HGBA1C in the last 72 hours. Fasting Lipid Panel: No results for input(s): CHOL, HDL, LDLCALC, TRIG, CHOLHDL, LDLDIRECT in the last 72 hours. Thyroid Function Tests: Recent Labs    06/05/17 0932  TSH 5.267*   Anemia Panel: No results for input(s): VITAMINB12, FOLATE, FERRITIN, TIBC, IRON, RETICCTPCT in the last 72 hours.  Dg Chest Port 1 View  Result Date: 06/05/2017 CLINICAL DATA:  Nausea, history CHF, coronary artery disease, stroke, hypertension EXAM: PORTABLE CHEST 1 VIEW COMPARISON:  Portable exam 0932 hours compared to 01/30/2017 FINDINGS: Enlargement of cardiac silhouette with pulmonary vascular congestion. Postsurgical changes of CABG. Atherosclerotic calcification aorta. Slightly increased perihilar markings versus prior exam question mild pulmonary edema superimposed upon a background of COPD/chronic bronchitis. No definite pleural effusion or pneumothorax. Bones demineralized. IMPRESSION: Enlargement of cardiac silhouette with pulmonary vascular congestion and question mild pulmonary edema. Underlying COPD/chronic  bronchitis. Electronically Signed   By: Lavonia Dana M.D.   On: 06/05/2017 09:52    TELEMETRY: Sinus rhythm, rate 62 bpm  ASSESSMENT AND PLAN:  Active Problems:   Chest pain    1. Atrial fibrillation with rapid ventricular rate, rates ranging in 100 to 140s this morning, received IV metoprolol and rate improved to 60s. Patient asymptomatic.  2. Chest pain with atypical features, resolved. 3. Borderline elevated troponin, likely secondary to demand supply ischemia, trending down: 0.41, 0.37, 0.37, followed by 0.33. 4. Known history of coronary artery disease, status post CABG 5. History of GI bleed while on Xarelto 6. Aortic  stenosis, moderate to severe with AVA 0.97 cm2   Recommendations: 1. Agree with overall therapy 2. Continue metoprolol tartrate 25 mg q 6 hours, blood pressure permitting. 3. Defer for this anticoagulation in light of recent history of GI bleed, advanced age, and fraility 4. Defer emergent cardiac catheterization   Clabe Seal, PA-C 06/06/2017 8:43 AM

## 2017-06-06 NOTE — Progress Notes (Signed)
Pt refusing the bed alarm. Explained the purpose of the bed alarm and concerning safety. Pt with private sitter at bedside. Will continue to monitor and assess.

## 2017-06-06 NOTE — Progress Notes (Addendum)
[place] at [place] NAME: Shadie Sweatman    MR#:  [id number]  DATE OF BIRTH:  21-Oct-1923  SUBJECTIVE:  CHIEF COMPLAINT:   Chief Complaint  Patient presents with  . Chest Pain  lethargic, caregiver at bedside REVIEW OF SYSTEMS:  Review of Systems  Unable to perform ROS: Acuity of condition    DRUG ALLERGIES:   Allergies  Allergen Reactions  . Cephalexin Other (See Comments)    Reaction:  Unknown  . Novocain [Procaine] Rash  . Sulfa Antibiotics Rash   VITALS:  Blood pressure 116/70, pulse 66, temperature 98 F (36.7 C), temperature source Oral, resp. rate 14, height 5\' 2"  (1.575 m), weight 47.2 kg (104 lb), SpO2 91 %. PHYSICAL EXAMINATION:  Physical Exam  Constitutional: She appears lethargic and malnourished. She appears unhealthy. She appears cachectic.  HENT:  Head: Normocephalic and atraumatic.  Eyes: Conjunctivae and EOM are normal. Pupils are equal, round, and reactive to light.  blind  Neck: Normal range of motion. Neck supple. No tracheal deviation present. No thyromegaly present.  Cardiovascular: Normal rate, regular rhythm and normal heart sounds.  Pulmonary/Chest: Effort normal and breath sounds normal. No respiratory distress. She has no wheezes. She exhibits no tenderness.  Abdominal: Soft. Bowel sounds are normal. She exhibits no distension. There is no tenderness.  Musculoskeletal: Normal range of motion.  Neurological: She appears lethargic. She is not disoriented. No cranial nerve deficit.  lethargy  Skin: Skin is warm and dry. No rash noted.  Psychiatric: Her affect is labile.   LABORATORY PANEL:  Female CBC Recent Labs  Lab 06/06/17 0134  WBC 5.6  HGB 10.6*  HCT 32.6*  PLT 266   ------------------------------------------------------------------------------------------------------------------ Chemistries  Recent Labs  Lab 06/05/17 0932 06/05/17 2123 06/06/17 0134  NA 127*  --  129*  K 4.2  --   4.6  CL 95*  --  100*  CO2 25  --  21*  GLUCOSE 94  --  96  BUN 20  --  22*  CREATININE 1.06*  --  0.89  CALCIUM 8.5*  --  8.1*  MG  --  2.1  --   AST 92*  --   --   ALT 82*  --   --   ALKPHOS 110  --   --   BILITOT 0.5  --   --    RADIOLOGY:  No results found. ASSESSMENT AND PLAN:  84 year old female with past medical history of primary biliary cirrhosis status post liver transplant, hypertension, chronic atrial fibrillation, aortic stenosis, previous TIA/CVA who presented to the hospital due to vague chest pain, nausea and dizziness and noted to have an elevated troponin.  1. Chest pain/elevated troponin-patient does have risk factors given advanced age, previous history of coronary disease, CHF and now atrial fibrillation.  -Her symptoms are atypical for angina. Troponin elevation is due to demand ischemia. EKG shows PVCs, atrial fibrillation but no acute ST or T-wave changes.  -Continue aspirin, beta blocker, nitroglycerin.  - appreciate cardiology consult, normal echocardiogram.  2. History of chronic atrial fibrillation-continue metoprolol. -Patient is not on long-term anticoagulation given previous history of GI bleed.  3. History of primary biliary cirrhosis status post liver transplant-continue CellCept, prednisone.  4. Essential hypertension-continue metoprolol.  5. History of previous TIA/CVA-continue aspirin.   Caregiver tells me that she is declining over time - not eating much and poor functional baseline. I've requested to call me once family is at bedside to have  goals of care conversations.    All the records are reviewed and case discussed with Care Management/Social Worker. Management plans discussed with the patient, family and they are in agreement.  CODE STATUS: DNR  TOTAL TIME TAKING CARE OF THIS PATIENT: 15 minutes.   More than 50% of the time was spent in counseling/coordination of care: YES  POSSIBLE D/C IN 1 DAYS, DEPENDING ON CLINICAL  CONDITION.   Max Sane M.D on 06/06/2017 at 5:09 PM  Between 7am to 6pm - Pager - [phone]  After 6pm go to [email or web address] - Technical brewer [place] [facility]  Office  [phone]  CC: Primary care physician; Kirk Ruths, MD  Note: This dictation was prepared with Dragon dictation along with smaller phrase technology. Any transcriptional errors that result from this process are unintentional.

## 2017-06-06 NOTE — Plan of Care (Signed)
  Progressing Clinical Measurements: Diagnostic test results will improve 06/06/2017 0308 - Progressing by Jeri Cos, RN Education: Knowledge of disease or condition will improve 06/06/2017 0308 - Progressing by Jeri Cos, RN

## 2017-06-07 LAB — ECHOCARDIOGRAM COMPLETE
Height: 62 in
Weight: 1664 oz

## 2017-06-07 MED ORDER — MORPHINE SULFATE 15 MG PO TABS: 15.0000 mg | ORAL_TABLET | Freq: Four times a day (QID) | ORAL | 0 refills | Status: DC | PRN

## 2017-06-07 MED ORDER — LORAZEPAM 1 MG PO TABS: 1.0000 mg | ORAL_TABLET | Freq: Three times a day (TID) | ORAL | 0 refills | Status: DC | PRN

## 2017-06-07 NOTE — Progress Notes (Signed)
Family Meeting Note  Advance Directive:yes  Today a meeting took place with the: Husband at bedside.  Patient is awake and onboard with decision to go home with Hospice   The following clinical team members were present during this meeting:MD  The following were discussed:Patient's diagnosis:   84 year old female with past medical history of primary biliary cirrhosis status post liver transplant, hypertension, chronic atrial fibrillation, aortic stenosis, previous TIA/CVA admitted due to vague chest pain, nausea and dizziness and noted to have an elevated troponin.  Patient's progosis: < 6 months and Goals for treatment: DNR  Additional follow-up to be provided: Home with Hospice - family in agreement  Time spent during discussion:20 minutes  Max Sane, MD

## 2017-06-07 NOTE — Discharge Instructions (Signed)
Hospice Introduction Hospice is a service that is designed to provide people who are terminally ill and their families with medical, spiritual, and psychological support. Its aim is to improve your quality of life by keeping you as alert and comfortable as possible. Who will be my providers when I begin hospice care? Hospice teams often include:  A nurse.  A doctor. The hospice doctor will be available for your care, but you can bring your regular doctor or nurse practitioner.  Social workers.  Religious leaders (such as a Clinical biochemist).  Trained volunteers.  What roles will providers play in my care? Hospice is performed by a team of health care professionals and volunteers who:  Help keep you comfortable: ? Hospice can be provided in your home or in a homelike setting. ? The hospice staff works with your family and friends to help meet your needs. ? You will enjoy the support of loved ones by receiving much of your basic care from family and friends.  Provide pain relief and manage your symptoms. The staff supply all necessary medicines and equipment.  Provide companionship when you are alone.  Allow you and your family to rest. They may do light housekeeping, prepare meals, and run errands.  Provide counseling. They will make sure your emotional, spiritual, and social needs and those of your family are being met.  Provide spiritual care: ? Spiritual care will be individualized to meet your needs and your family's needs. ? Spiritual care may involve:  Helping you look at what death means to you.  Helping you say goodbye to your family and friends.  Performing a specific religious ceremony or ritual.  When should hospice care begin? Most people who use hospice are believed to have fewer than 6 months to live.  Your family and health care providers can help you decide when hospice services should begin.  If your condition improves, you may discontinue the program.  What  should I consider before selecting a program? Most hospice programs are run by nonprofit, independent organizations. Some are affiliated with hospitals, nursing homes, or home health care agencies. Hospice programs can take place in the home or at a hospice center, hospital, or skilled nursing facility. When choosing a hospice program, ask the following questions:  What services are available to me?  What services will be offered to my loved ones?  How involved will my loved ones be?  How involved will my health care provider be?  Who makes up the hospice care team? How are they trained or screened?  How will my pain and symptoms be managed?  If my circumstances change, can the services be provided in a different setting, such as my home or in the hospital?  Is the program reviewed and licensed by the state or certified in some other way?  Where can I learn more about hospice? You can learn about existing hospice programs in your area from your health care providers. You can also read more about hospice online. The websites of the following organizations contain helpful information:  The [facility] and Palliative Care Organization [facility]).  The Hospice Association of America ([place]).  The [place].  The American Cancer Society (ACS).  Hospice Net.  This information is not intended to replace advice given to you by your health care provider. Make sure you discuss any questions you have with your health care provider. Document Released: 07/27/2003 Document Revised: 11/24/2015 Document Reviewed: 02/17/2013 Elsevier Interactive Patient Education  2017 [employer].

## 2017-06-07 NOTE — Care Management Important Message (Signed)
Important Message  Patient Details  Name: Brenda Roman MRN: [id number] Date of Birth: [birth year]   Medicare Important Message Given:  Yes    Katrina Stack, RN 06/07/2017, 7:46 AM

## 2017-06-07 NOTE — Plan of Care (Signed)
  Progressing Education: Understanding of cardiac disease, CV risk reduction, and recovery process will improve 06/07/2017 0153 - Progressing by Loran Senters, RN Clinical Measurements: Ability to maintain clinical measurements within normal limits will improve 06/07/2017 0153 - Progressing by Loran Senters, RN Will remain free from infection 06/07/2017 0153 - Progressing by Loran Senters, RN Safety: Ability to remain free from injury will improve 06/07/2017 0153 - Progressing by Loran Senters, RN Skin Integrity: Risk for impaired skin integrity will decrease 06/07/2017 0153 - Progressing by Loran Senters, RN Activity: Ability to tolerate increased activity will improve 06/07/2017 0153 - Progressing by Loran Senters, RN

## 2017-06-07 NOTE — Progress Notes (Signed)
Follow up visit made to new referral for [facility] services at home. Patient seen sitting up in bed, alert, eating breakfast, husband and caregiver present at bedside. Plan is now for discharge home by car. Hospital care team updated. Echocardiogram results faxed to referral No DME needs a time of discharge per Mr. Blizzard. Flo Shanks RN, BSN, [facility] [place], hospital lisiaon [phone]

## 2017-06-11 NOTE — Discharge Summary (Signed)
[place] [facility] [place] NAME: Brihanna Devenport    MR#:  [id number]  DATE OF BIRTH:  [birth year]  DATE OF ADMISSION:  06/05/2017   ADMITTING PHYSICIAN: Henreitta Leber, MD  DATE OF DISCHARGE: 06/07/2017 11:25 AM  PRIMARY CARE PHYSICIAN: Kirk Ruths, MD   ADMISSION DIAGNOSIS:  Nausea [R11.0] Troponin I above reference range [R74.8] Chest pain, unspecified type [R07.9] DISCHARGE DIAGNOSIS:  Active Problems:   Chest pain   Atrial fibrillation ([place])  SECONDARY DIAGNOSIS:   Past Medical History:  Diagnosis Date  . Aortic stenosis   . 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 Cancer ([place])    skin  . CHF (congestive heart failure) ([place])   . Coronary artery disease   . CVA (cerebral infarction) June '16  . Hypertension   . Liver transplanted ([place])   . Macular degeneration   . Mitral valve disease   . PBC (primary biliary cirrhosis)    s/p liver transplant  . Shingles   . TIA (transient ischemic attack)    HOSPITAL COURSE:  84 year old female with past medical history of primary biliary cirrhosis status post liver transplant, hypertension, chronic atrial fibrillation, aortic stenosis, previous TIA/CVA admitted due to vague chest pain, nausea and dizziness and noted to have an elevated troponin.  1. Chest pain/elevated troponin-due to demand ischemia, patient/family chose comfort care and Hospice  2. History of chronic atrial fibrillation-continue metoprolol. -Patient is not on long-term anticoagulation given previous history of GI bleed.  3. History of primary biliary cirrhosis status post liver transplant-continue CellCept  4. Essential hypertension-continue metoprolol.  5. History of previous TIA/CVA-continue aspirin. DISCHARGE CONDITIONS:  fair CONSULTS OBTAINED:  Treatment Team:  Isaias Cowman, MD DRUG ALLERGIES:   Allergies  Allergen Reactions  . Cephalexin Other (See Comments)    Reaction:  Unknown  .  Novocain [Procaine] Rash  . Sulfa Antibiotics Rash   DISCHARGE MEDICATIONS:   Allergies as of 06/07/2017      Reactions   Cephalexin Other (See Comments)   Reaction:  Unknown   Novocain [procaine] Rash   Sulfa Antibiotics Rash      Medication List    STOP taking these medications   cyclobenzaprine 5 MG tablet Commonly known as:  FLEXERIL   lidocaine 5 % Commonly known as:  LIDODERM   methenamine 1 g tablet Commonly known as:  HIPREX   multivitamin with minerals tablet     TAKE these medications   acetaminophen 325 MG tablet Commonly known as:  TYLENOL Take 2 tablets by mouth every 6 (six) hours as needed. Reported on 10/26/2015   aspirin EC 81 MG tablet Take 81 mg by mouth daily.   docusate sodium 50 MG capsule Commonly known as:  COLACE Take 100 mg by mouth daily.   LORazepam 1 MG tablet Commonly known as:  ATIVAN Take 1 tablet (1 mg total) by mouth every 8 (eight) hours as needed for anxiety or sleep.   metoprolol tartrate 25 MG tablet Commonly known as:  LOPRESSOR Take 25 mg by mouth 3 (three) times daily.   mycophenolate 500 MG tablet Commonly known as:  CELLCEPT Take 500 mg by mouth every 12 (twelve) hours.   nitroGLYCERIN 0.4 MG SL tablet Commonly known as:  NITROSTAT Place 1 tablet under the tongue as needed. Reported on 10/26/2015   polyethylene glycol packet Commonly known as:  MIRALAX / GLYCOLAX Take 17 g by mouth daily as needed.   predniSONE 5  MG tablet Commonly known as:  DELTASONE Take 5 mg by mouth daily.   valACYclovir 500 MG tablet Commonly known as:  VALTREX Take 500 mg by mouth every evening.        DISCHARGE INSTRUCTIONS:   DIET:  Cardiac diet DISCHARGE CONDITION:  Good ACTIVITY:  Activity as tolerated OXYGEN:  Home Oxygen: No.  Oxygen Delivery: room air DISCHARGE LOCATION:  home with Hospice  If you experience worsening of your admission symptoms, develop shortness of breath, life threatening emergency, suicidal or  homicidal thoughts you must seek medical attention immediately by calling 911 or calling your MD immediately  if symptoms less severe.  You Must read complete instructions/literature along with all the possible adverse reactions/side effects for all the Medicines you take and that have been prescribed to you. Take any new Medicines after you have completely understood and accpet all the possible adverse reactions/side effects.   Please note  You were cared for by a hospitalist during your hospital stay. If you have any questions about your discharge medications or the care you received while you were in the hospital after you are discharged, you can call the unit and asked to speak with the hospitalist on call if the hospitalist that took care of you is not available. Once you are discharged, your primary care physician will handle any further medical issues. Please note that NO REFILLS for any discharge medications will be authorized once you are discharged, as it is imperative that you return to your primary care physician (or establish a relationship with a primary care physician if you do not have one) for your aftercare needs so that they can reassess your need for medications and monitor your lab values.    On the day of Discharge:  VITAL SIGNS:  Blood pressure 138/80, pulse 65, temperature 97.6 F (36.4 C), resp. rate 17, height 5\' 2"  (1.575 m), weight 47.2 kg (104 lb), SpO2 93 %. PHYSICAL EXAMINATION:  GENERAL:  84 y.o.-year-old patient lying in the bed with no acute distress.  EYES: Pupils equal, round, reactive to light and accommodation. No scleral icterus. Extraocular muscles intact.  HEENT: Head atraumatic, normocephalic. Oropharynx and nasopharynx clear.  NECK:  Supple, no jugular venous distention. No thyroid enlargement, no tenderness.  LUNGS: Normal breath sounds bilaterally, no wheezing, rales,rhonchi or crepitation. No use of accessory muscles of respiration.  CARDIOVASCULAR:  S1, S2 normal. No murmurs, rubs, or gallops.  ABDOMEN: Soft, non-tender, non-distended. Bowel sounds present. No organomegaly or mass.  EXTREMITIES: No pedal edema, cyanosis, or clubbing.  NEUROLOGIC: Cranial nerves II through XII are intact. Muscle strength 5/5 in all extremities. Sensation intact. Gait not checked.  PSYCHIATRIC: The patient is alert and oriented x 3.  SKIN: No obvious rash, lesion, or ulcer.  DATA REVIEW:   CBC Recent Labs  Lab 06/06/17 0134  WBC 5.6  HGB 10.6*  HCT 32.6*  PLT 266    Chemistries  Recent Labs  Lab 06/05/17 0932 06/05/17 2123 06/06/17 0134  NA 127*  --  129*  K 4.2  --  4.6  CL 95*  --  100*  CO2 25  --  21*  GLUCOSE 94  --  96  BUN 20  --  22*  CREATININE 1.06*  --  0.89  CALCIUM 8.5*  --  8.1*  MG  --  2.1  --   AST 92*  --   --   ALT 82*  --   --   ALKPHOS 110  --   --  BILITOT 0.5  --   --      Follow-up Information    Kirk Ruths, MD. Schedule an appointment as soon as possible for a visit in 1 week(s).   Specialty:  Internal Medicine Contact information: [address] [place] [address] [phone]           Management plans discussed with the patient, family and they are in agreement.  CODE STATUS: Prior   TOTAL TIME TAKING CARE OF THIS PATIENT: 45 minutes.    Max Sane M.D on 06/11/2017 at 11:24 PM  Between 7am to 6pm - Pager - [phone]  After 6pm go to [email or web address] - [employer] [facility]  Office  [phone]  CC: Primary care physician; Kirk Ruths, MD   Note: This dictation was prepared with Dragon dictation along with smaller phrase technology. Any transcriptional errors that result from this process are unintentional.

## 2017-09-29 ENCOUNTER — Other Ambulatory Visit: Payer: Self-pay

## 2017-09-29 ENCOUNTER — Emergency Department
Admission: EM | Admit: 2017-09-29 | Discharge: 2017-09-29 | Disposition: A | Discharge status: Home / Self Care | Payer: Medicare Other | Attending: Emergency Medicine | Admitting: Emergency Medicine

## 2017-09-29 DIAGNOSIS — Z79899 Other long term (current) drug therapy: Secondary | ICD-10-CM | POA: Insufficient documentation

## 2017-09-29 DIAGNOSIS — I509 Heart failure, unspecified: Secondary | ICD-10-CM | POA: Insufficient documentation

## 2017-09-29 DIAGNOSIS — Z944 Liver transplant status: Secondary | ICD-10-CM | POA: Insufficient documentation

## 2017-09-29 DIAGNOSIS — I4891 Unspecified atrial fibrillation: Secondary | ICD-10-CM | POA: Diagnosis not present

## 2017-09-29 DIAGNOSIS — I110 Hypertensive heart disease with heart failure: Secondary | ICD-10-CM | POA: Insufficient documentation

## 2017-09-29 DIAGNOSIS — I2510 Atherosclerotic heart disease of native coronary artery without angina pectoris: Secondary | ICD-10-CM | POA: Diagnosis not present

## 2017-09-29 DIAGNOSIS — Z87891 Personal history of nicotine dependence: Secondary | ICD-10-CM | POA: Diagnosis not present

## 2017-09-29 DIAGNOSIS — G4700 Insomnia, unspecified: Secondary | ICD-10-CM | POA: Insufficient documentation

## 2017-09-29 DIAGNOSIS — Z7982 Long term (current) use of aspirin: Secondary | ICD-10-CM | POA: Diagnosis not present

## 2017-09-29 DIAGNOSIS — I10 Essential (primary) hypertension: Secondary | ICD-10-CM | POA: Diagnosis present

## 2017-09-29 LAB — CBC WITH DIFFERENTIAL/PLATELET
BASOS PCT: 1 %
Basophils Absolute: 0 10*3/uL (ref 0–0.1)
EOS ABS: 0.1 10*3/uL (ref 0–0.7)
EOS PCT: 2 %
HCT: 36.3 % (ref 35.0–47.0)
Hemoglobin: 12.3 g/dL (ref 12.0–16.0)
LYMPHS PCT: 31 %
Lymphs Abs: 1.7 10*3/uL (ref 1.0–3.6)
MCH: 33.5 pg (ref 26.0–34.0)
MCHC: 34 g/dL (ref 32.0–36.0)
MCV: 98.6 fL (ref 80.0–100.0)
MONO ABS: 0.5 10*3/uL (ref 0.2–0.9)
Monocytes Relative: 9 %
Neutro Abs: 3.1 10*3/uL (ref 1.4–6.5)
Neutrophils Relative %: 57 %
PLATELETS: 286 10*3/uL (ref 150–440)
RBC: 3.68 MIL/uL — AB (ref 3.80–5.20)
RDW: 15.5 % — AB (ref 11.5–14.5)
WBC: 5.5 10*3/uL (ref 3.6–11.0)

## 2017-09-29 LAB — COMPREHENSIVE METABOLIC PANEL
ALT: 15 U/L (ref 14–54)
AST: 18 U/L (ref 15–41)
Albumin: 3.9 g/dL (ref 3.5–5.0)
Alkaline Phosphatase: 67 U/L (ref 38–126)
Anion gap: 9 (ref 5–15)
BUN: 16 mg/dL (ref 6–20)
CHLORIDE: 96 mmol/L — AB (ref 101–111)
CO2: 27 mmol/L (ref 22–32)
CREATININE: 0.74 mg/dL (ref 0.44–1.00)
Calcium: 9 mg/dL (ref 8.9–10.3)
GFR calc Af Amer: >60 mL/min (ref >60)
GFR calc non Af Amer: >60 mL/min (ref >60)
Glucose, Bld: 88 mg/dL (ref 65–99)
POTASSIUM: 4.4 mmol/L (ref 3.5–5.1)
SODIUM: 132 mmol/L — AB (ref 135–145)
Total Bilirubin: 0.7 mg/dL (ref 0.3–1.2)
Total Protein: 6.8 g/dL (ref 6.5–8.1)

## 2017-09-29 LAB — TROPONIN I: TROPONIN I: 0.03 ng/mL — AB (ref <0.03)

## 2017-09-29 MED ORDER — METOPROLOL TARTRATE 25 MG PO TABS
25.0000 mg | ORAL_TABLET | Freq: Once | ORAL | Status: AC
  Administered 2017-09-29: 25 mg via ORAL
  Filled 2017-09-29: qty 1

## 2017-09-29 NOTE — ED Notes (Signed)
Dr. Kerman Passey in to speak with pt.

## 2017-09-29 NOTE — ED Notes (Signed)
This RN assisted patient to the restroom.  Patient able to stand and walk with significant assistance.

## 2017-09-29 NOTE — ED Notes (Signed)
Critical troponin of 0.03 called from lab. Dr. Kerman Passey notified.

## 2017-09-29 NOTE — ED Notes (Signed)
Pt assisted with bedpan by staff.

## 2017-09-29 NOTE — ED Provider Notes (Signed)
[facility] Emergency Department Provider Note  Time seen: 6:25 AM  I have reviewed the triage vital signs and the nursing notes.   HISTORY  Chief Complaint Insomnia and Hypertension    HPI Brenda Roman is a 84 y.o. female with a past medical history of hypertension, primary biliary cirrhosis status post liver transplant, CVA, CHF, blind, hard of hearing, presents to the emergency department for hypertension.  According to the husband patient has long-standing insomnia.  States they have tried all sorts of medications without success.  Last night the patient could not sleep which is fairly typical but then began complaining of right leg pain as well so the husband took the patient's blood pressure and it was around 200/100.  He was concerned so he called EMS to bring the patient to the emergency department.  Here patient appears well, no distress.  Denies any chest pain or trouble breathing.  Patient is somewhat anxious in appearance, is upset that she could not sleep tonight and states she only slept 30 minutes last night.  Husband states this is an ongoing issue for her.   Past Medical History:  Diagnosis Date  . Aortic stenosis   . 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 Cancer ([place])    skin  . CHF (congestive heart failure) ([place])   . Coronary artery disease   . CVA (cerebral infarction) June '16  . Hypertension   . Liver transplanted ([place])   . Macular degeneration   . Mitral valve disease   . PBC (primary biliary cirrhosis)    s/p liver transplant  . Shingles   . TIA (transient ischemic attack)     Patient Active Problem List   Diagnosis Date Noted  . Atrial fibrillation ([place]) 06/06/2017  . Chest pain 06/05/2017  . Dizziness 04/23/2017  . Diarrhea 04/23/2017  . UTI (urinary tract infection) 04/23/2017  . Acute diverticulitis 11/23/2016  . Arrhythmia, ventricular 09/19/2015  . Hyponatremia 07/30/2015  . Acute CHF (congestive heart failure) ([place])  07/18/2015  . 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 Closed wedge fracture of lumbar vertebra ([place]) 10/11/2014  . Ischemic stroke ([place]) 10/07/2014  . Acute lower UTI 10/07/2014  . 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 Closed fracture of humerus, surgical neck 09/29/2014  . Goals of care, counseling/discussion   . Acute encephalopathy 08/29/2014  . Hypoxia 08/29/2014  . Healthcare-associated pneumonia 08/29/2014  . Pneumonia 08/29/2014  . CVA (cerebral infarction) 08/23/2014  . Arteriosclerosis of coronary artery 03/15/2014  . Paroxysmal atrial fibrillation ([place]) 03/15/2014  . Atherosclerosis of abdominal aorta ([place]) 01/27/2014  . HLD (hyperlipidemia) 04/07/2012  . BP (high blood pressure) 04/07/2012  . History of liver transplant ([place]) 04/03/2012  . Encounter for other procedures for purposes other than remedying health state 04/03/2012  . Prolapse of urethra 03/10/2012  . Pelvic relaxation due to uterovaginal prolapse 03/10/2012    Past Surgical History:  Procedure Laterality Date  . BOWEL RESECTION    . COLONOSCOPY    . CORONARY ARTERY BYPASS GRAFT    . JOINT REPLACEMENT    . LIVER TRANSPLANT  1990    Prior to Admission medications   Medication Sig Start Date End Date Taking? Authorizing Provider  acetaminophen (TYLENOL) 325 MG tablet Take 2 tablets by mouth every 6 (six) hours as needed. Reported on 10/26/2015    [provider]  aspirin EC 81 MG tablet Take 81 mg by mouth daily.    [provider]  docusate  sodium (COLACE) 50 MG capsule Take 100 mg by mouth daily.    [provider]  LORazepam (ATIVAN) 1 MG tablet Take 1 tablet (1 mg total) by mouth every 8 (eight) hours as needed for anxiety or sleep. 06/07/17   Max Sane, MD  metoprolol tartrate (LOPRESSOR) 25 MG tablet Take 25 mg by mouth 3 (three) times daily.    [provider]  mycophenolate (CELLCEPT) 500 MG tablet Take 500 mg by mouth every 12 (twelve) hours.    [provider]  nitroGLYCERIN (NITROSTAT) 0.4 MG SL tablet Place 1 tablet under the tongue as needed. Reported on 10/26/2015 06/20/15   [provider]  polyethylene glycol (MIRALAX / GLYCOLAX) packet Take 17 g by mouth daily as needed.    [provider]  predniSONE (DELTASONE) 5 MG tablet Take 5 mg by mouth daily.    [provider]  valACYclovir (VALTREX) 500 MG tablet Take 500 mg by mouth every evening.    [provider]    Allergies  Allergen Reactions  . Cephalexin Other (See Comments)    Reaction:  Unknown  . Novocain [Procaine] Rash  . Sulfa Antibiotics Rash    Family History  Problem Relation Age of Onset  . Hypertension Father   . Heart attack Father   . Stroke Mother     Social History Social History   Tobacco Use  . Smoking status: Former Research scientist (life sciences)  . Smokeless tobacco: Never Used  Substance Use Topics  . Alcohol use: No  . Drug use: No    Review of Systems Constitutional: Negative for fever. Cardiovascular: Negative for chest pain. Respiratory: Negative for shortness of breath. Gastrointestinal: Negative for abdominal pain, vomiting  Musculoskeletal: Right leg pain, now resolved Skin: Negative for skin complaints  Neurological: Negative for headache All other ROS negative  ____________________________________________   PHYSICAL EXAM:  VITAL SIGNS: ED Triage Vitals [09/29/17 0529]  Enc Vitals Group     BP (!) 209/90     Pulse Rate 73     Resp 18     Temp 98.2 F (36.8 C)     Temp Source Oral     SpO2 99 %     Weight 104 lb (47.2 kg)     Height 5\' 3"  (1.6 m)     Head Circumference      Peak Flow      Pain Score 0     Pain Loc      Pain Edu?      Excl. in [place]?     Constitutional: Alert and oriented. Well appearing and in no distress. Eyes: Keeps eyes closed throughout exam.  Blind. ENT   Head: Normocephalic and atraumatic   Mouth/Throat: Mucous membranes are moist. Cardiovascular: Normal rate,  regular rhythm Respiratory: Normal respiratory effort without tachypnea nor retractions. Breath sounds are clear  Gastrointestinal: Soft and nontender. No distention. Musculoskeletal: Nontender with normal range of motion in all extremities. Neurologic:  Normal speech and language. No gross focal neurologic deficits Skin:  Skin is warm, dry and intact.  Psychiatric: Mood and affect are normal.   ____________________________________________   EKG reviewed and interpreted by myself shows sinus rhythm at 74 bpm with a widened QRS, left axis deviation, largely normal intervals with nonspecific ST changes consistent with left bundle branch block.  Unchanged from prior EKG.   INITIAL IMPRESSION / ASSESSMENT AND PLAN / ED COURSE  Pertinent labs & imaging results that were available during my care of the patient  were reviewed by me and considered in my medical decision making (see chart for details).  Patient presents emergency department for concerns of her hypertension.  According to the husband the patient cannot sleep at night which is chronic for her.  Was complaining of some right leg pain.  Patient had a stroke affecting her right side and will occasionally have pain.  Husband checked the patient's blood pressure and it was 200/100 which is what concerned him enough to call EMS to bring the patient to the emergency department.  Here the patient appears well, no distress but is mildly anxious in appearance.  We will check labs, EKG and continue to closely monitor in the emergency department.  Blood pressure currently 173/92.  I reviewed the patient's records she is appears to take only metoprolol as her only antihypertensive.  Patient's labs have resulted largely at baseline.  EKG is unchanged from prior.  As the patient appears well we will discharge home with PCP follow-up to discuss her blood pressure and insomnia issues.  Patient has been agreeable to  plan.  ____________________________________________   FINAL CLINICAL IMPRESSION(S) / ED DIAGNOSES  Hypertension Insomnia    Harvest Dark, MD 09/29/17 [phone]

## 2017-09-29 NOTE — ED Triage Notes (Signed)
Pt from home with complaint of insomnia and hypertension. Pt is currently on 1511mL fluid restriction for sodium currently. Pt denies pain, nausea. Pt is not taking any antihypertensives. Pt is hoh and blind.

## 2017-11-08 IMAGING — CR DG WRIST 2V*R*
1 series · 2 of 2 positions shown · non-contrast
Comparison: None.

CLINICAL DATA: Fall yesterday. Right wrist pain. Initial encounter.

EXAM:
RIGHT WRIST - 2 VIEW

[Series 1: dg wrist 2 views right · 0.14mm/px · 2 of 2 slices shown]
[im 1/2]
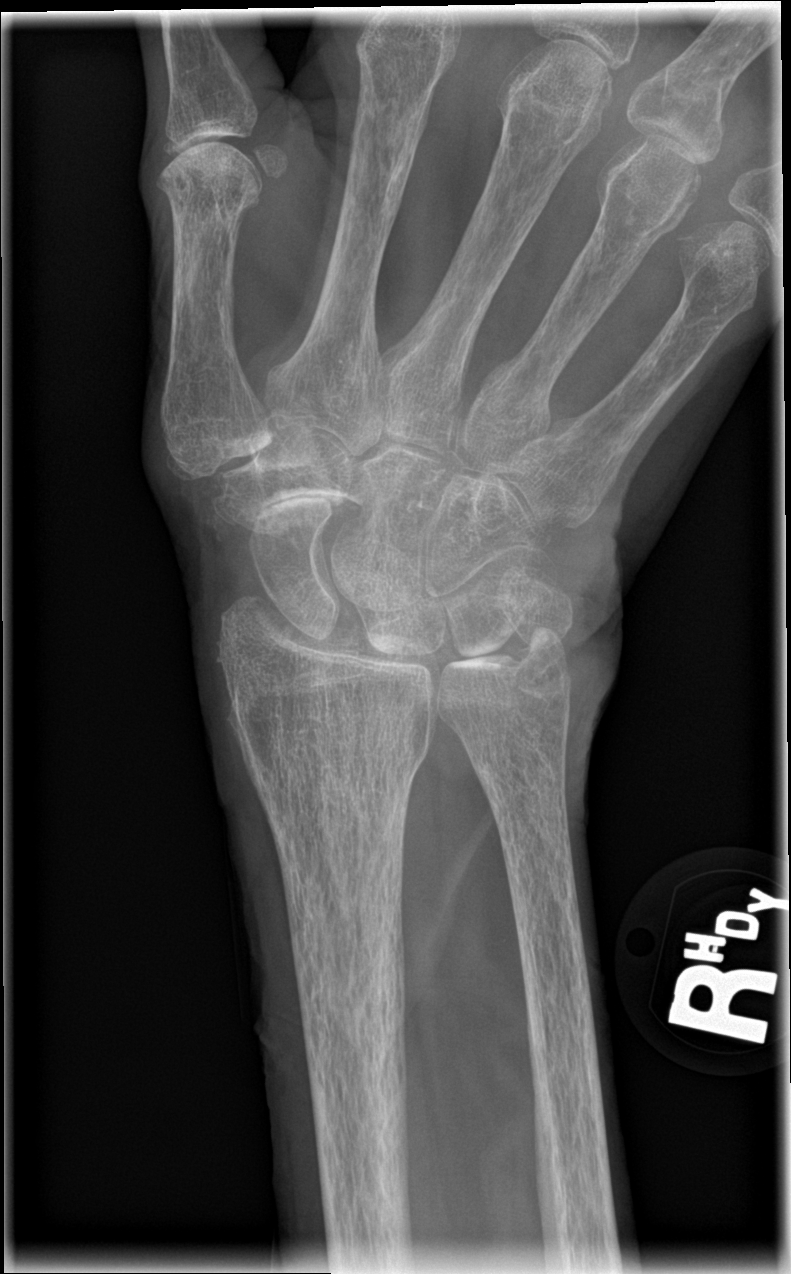
[im 2/2]
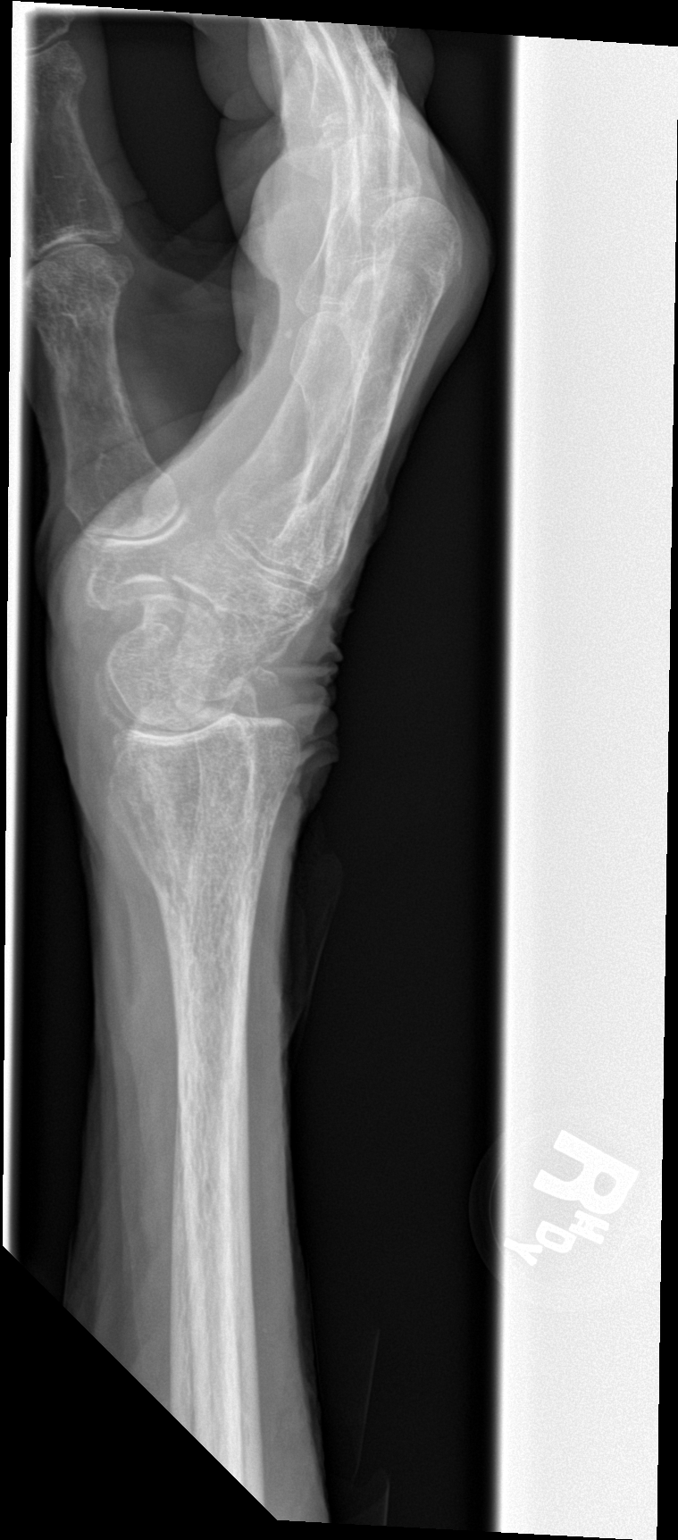

[2 of 2 positions shown; findings below may reference images not displayed]

FINDINGS: There is no evidence of acute fracture or dislocation. Diffuse
osteopenia. Moderate osteoarthritis of the STT joint complex. Mild
widening of scapholunate space. Mild radiocarpal joint space
narrowing .
IMPRESSION: No acute findings.

## 2017-11-20 ENCOUNTER — Other Ambulatory Visit: Payer: Self-pay

## 2017-11-20 ENCOUNTER — Emergency Department: Payer: Medicare Other

## 2017-11-20 ENCOUNTER — Emergency Department
Admission: EM | Admit: 2017-11-20 | Discharge: 2017-11-20 | Disposition: A | Discharge status: Home / Self Care | Payer: Medicare Other | Attending: Emergency Medicine | Admitting: Emergency Medicine

## 2017-11-20 DIAGNOSIS — Z85828 Personal history of other malignant neoplasm of skin: Secondary | ICD-10-CM | POA: Diagnosis not present

## 2017-11-20 DIAGNOSIS — Z8673 Personal history of transient ischemic attack (TIA), and cerebral infarction without residual deficits: Secondary | ICD-10-CM | POA: Insufficient documentation

## 2017-11-20 DIAGNOSIS — N3090 Cystitis, unspecified without hematuria: Secondary | ICD-10-CM | POA: Diagnosis not present

## 2017-11-20 DIAGNOSIS — Z7982 Long term (current) use of aspirin: Secondary | ICD-10-CM | POA: Insufficient documentation

## 2017-11-20 DIAGNOSIS — Z951 Presence of aortocoronary bypass graft: Secondary | ICD-10-CM | POA: Diagnosis not present

## 2017-11-20 DIAGNOSIS — Z87891 Personal history of nicotine dependence: Secondary | ICD-10-CM | POA: Insufficient documentation

## 2017-11-20 DIAGNOSIS — R4182 Altered mental status, unspecified: Secondary | ICD-10-CM | POA: Insufficient documentation

## 2017-11-20 DIAGNOSIS — Z79899 Other long term (current) drug therapy: Secondary | ICD-10-CM | POA: Insufficient documentation

## 2017-11-20 DIAGNOSIS — R531 Weakness: Secondary | ICD-10-CM | POA: Insufficient documentation

## 2017-11-20 DIAGNOSIS — I2510 Atherosclerotic heart disease of native coronary artery without angina pectoris: Secondary | ICD-10-CM | POA: Diagnosis not present

## 2017-11-20 DIAGNOSIS — R638 Other symptoms and signs concerning food and fluid intake: Secondary | ICD-10-CM | POA: Insufficient documentation

## 2017-11-20 DIAGNOSIS — I509 Heart failure, unspecified: Secondary | ICD-10-CM | POA: Insufficient documentation

## 2017-11-20 DIAGNOSIS — I110 Hypertensive heart disease with heart failure: Secondary | ICD-10-CM | POA: Insufficient documentation

## 2017-11-20 DIAGNOSIS — R441 Visual hallucinations: Secondary | ICD-10-CM | POA: Diagnosis present

## 2017-11-20 LAB — BASIC METABOLIC PANEL
Anion gap: 10 (ref 5–15)
BUN: 17 mg/dL (ref 8–23)
CHLORIDE: 94 mmol/L — AB (ref 98–111)
CO2: 26 mmol/L (ref 22–32)
CREATININE: 0.84 mg/dL (ref 0.44–1.00)
Calcium: 9.3 mg/dL (ref 8.9–10.3)
GFR calc Af Amer: >60 mL/min (ref >60)
GFR calc non Af Amer: 59 mL/min — ABNORMAL LOW (ref >60)
GLUCOSE: 129 mg/dL — AB (ref 70–99)
POTASSIUM: 4.3 mmol/L (ref 3.5–5.1)
SODIUM: 130 mmol/L — AB (ref 135–145)

## 2017-11-20 LAB — CBC
HEMATOCRIT: 37 % (ref 35.0–47.0)
HEMOGLOBIN: 12.5 g/dL (ref 12.0–16.0)
MCH: 33.7 pg (ref 26.0–34.0)
MCHC: 33.9 g/dL (ref 32.0–36.0)
MCV: 99.4 fL (ref 80.0–100.0)
Platelets: 220 10*3/uL (ref 150–440)
RBC: 3.72 MIL/uL — ABNORMAL LOW (ref 3.80–5.20)
RDW: 13.8 % (ref 11.5–14.5)
WBC: 9.9 10*3/uL (ref 3.6–11.0)

## 2017-11-20 LAB — URINALYSIS, COMPLETE (UACMP) WITH MICROSCOPIC
Bilirubin Urine: NEGATIVE
Glucose, UA: NEGATIVE mg/dL
Ketones, ur: 5 mg/dL — AB
Nitrite: POSITIVE — AB
PROTEIN: NEGATIVE mg/dL
SQUAMOUS EPITHELIAL / LPF: NONE SEEN (ref 0–5)
Specific Gravity, Urine: 1.011 (ref 1.005–1.030)
pH: 7 (ref 5.0–8.0)

## 2017-11-20 MED ORDER — AMOXICILLIN-POT CLAVULANATE 875-125 MG PO TABS
1.0000 | ORAL_TABLET | Freq: Once | ORAL | Status: AC
  Administered 2017-11-20: 1 via ORAL
  Filled 2017-11-20: qty 1

## 2017-11-20 MED ORDER — AMOXICILLIN-POT CLAVULANATE 875-125 MG PO TABS: 1.0000 | ORAL_TABLET | Freq: Two times a day (BID) | ORAL | 0 refills | Status: DC

## 2017-11-20 NOTE — ED Provider Notes (Signed)
[facility] Emergency Department Provider Note  ____________________________________________  Time seen: Approximately 6:41 PM  I have reviewed the triage vital signs and the nursing notes.   HISTORY  Chief Complaint Hallucinations  Level 5 Caveat: Portions of the History and Physical including HPI and review of systems are unable to be completely obtained due to patient being a poor historian    HPI Brenda Roman is a 84 y.o. 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  She sees treesand roads and fences .  Denies headache, fever chills or pain.  Decreased oral intake over the last few days.  Generalized weakness.  No urinary incontinence or dysuria   Past Medical History:  Diagnosis Date  . Aortic stenosis   . 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 Cancer ([place])    skin  . CHF (congestive heart failure) ([place])   . Coronary artery disease   . CVA (cerebral infarction) June '16  . Hypertension   . Liver transplanted ([place])   . Macular degeneration   . Mitral valve disease   . PBC (primary biliary cirrhosis)    s/p liver transplant  . Shingles   . TIA (transient ischemic attack)      Patient Active Problem List   Diagnosis Date Noted  . Atrial fibrillation ([place]) 06/06/2017  . Chest pain 06/05/2017  . Dizziness 04/23/2017  . Diarrhea 04/23/2017  . UTI (urinary tract infection) 04/23/2017  . Acute diverticulitis 11/23/2016  . Arrhythmia, ventricular 09/19/2015  . Hyponatremia 07/30/2015  . Acute CHF (congestive heart failure) ([place]) 07/18/2015  . 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 Closed wedge fracture of lumbar vertebra ([place]) 10/11/2014  . Ischemic stroke ([place]) 10/07/2014  . Acute lower UTI 10/07/2014  . 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 Closed fracture of humerus, surgical neck 09/29/2014  . Goals of care, counseling/discussion   . Acute  encephalopathy 08/29/2014  . Hypoxia 08/29/2014  . Healthcare-associated pneumonia 08/29/2014  . Pneumonia 08/29/2014  . CVA (cerebral infarction) 08/23/2014  . Arteriosclerosis of coronary artery 03/15/2014  . Paroxysmal atrial fibrillation ([place]) 03/15/2014  . Atherosclerosis of abdominal aorta ([place]) 01/27/2014  . HLD (hyperlipidemia) 04/07/2012  . BP (high blood pressure) 04/07/2012  . History of liver transplant ([place]) 04/03/2012  . Encounter for other procedures for purposes other than remedying health state 04/03/2012  . Prolapse of urethra 03/10/2012  . Pelvic relaxation due to uterovaginal prolapse 03/10/2012     Past Surgical History:  Procedure Laterality Date  . BOWEL RESECTION    . COLONOSCOPY    . CORONARY ARTERY BYPASS GRAFT    . JOINT REPLACEMENT    . LIVER TRANSPLANT  1990     Prior to Admission medications   Medication Sig Start Date End Date Taking? Authorizing Provider  aspirin EC 81 MG tablet Take 81 mg by mouth daily.   Yes [provider]  metoprolol succinate (TOPROL-XL) 50 MG 24 hr tablet Take 7.5 mg by mouth daily. Take with or immediately following a meal.   Yes [provider]  mycophenolate (CELLCEPT) 500 MG tablet Take 500 mg by mouth every 12 (twelve) hours.   Yes [provider]  predniSONE (DELTASONE) 5 MG tablet Take 5 mg by mouth daily.   Yes [provider]  valACYclovir (VALTREX) 500 MG tablet Take 500 mg by mouth daily as needed.    Yes [provider]  zolpidem (AMBIEN) 5 MG tablet Take 1 tablet by mouth at bedtime. 11/06/17  Yes [provider]  acetaminophen (TYLENOL) 325 MG tablet Take 2 tablets by mouth every 6 (six) hours as needed. Reported on 10/26/2015    [provider]  amoxicillin-clavulanate (AUGMENTIN) 875-125 MG tablet Take 1 tablet by mouth 2 (two) times daily. 11/20/17   Carrie Mew, MD  docusate sodium (COLACE) 50 MG capsule Take 100 mg by mouth daily as needed  for mild constipation.     [provider]  LORazepam (ATIVAN) 1 MG tablet Take 1 tablet (1 mg total) by mouth every 8 (eight) hours as needed for anxiety or sleep. Patient not taking: Reported on 11/20/2017 06/07/17   Max Sane, MD  nitroGLYCERIN (NITROSTAT) 0.4 MG SL tablet Place 1 tablet under the tongue as needed. Reported on 10/26/2015 06/20/15   [provider]  polyethylene glycol (MIRALAX / GLYCOLAX) packet Take 17 g by mouth daily as needed.    [provider]  zaleplon (SONATA) 5 MG capsule Take 1 capsule by mouth at bedtime as needed for sleep.  08/28/17   [provider]     Allergies Cephalexin; Novocain [procaine]; and Sulfa antibiotics   Family History  Problem Relation Age of Onset  . Hypertension Father   . Heart attack Father   . Stroke Mother     Social History Social History   Tobacco Use  . Smoking status: Former Research scientist (life sciences)  . Smokeless tobacco: Never Used  Substance Use Topics  . Alcohol use: No  . Drug use: No    Review of Systems  Constitutional:   No fever  Cardiovascular:   No chest pain or syncope. Respiratory:   No dyspnea or cough. Gastrointestinal:   Negative for abdominal pain, vomiting and diarrhea.  Musculoskeletal:   Negative for focal pain or swelling All other systems reviewed and are negative except as documented above in ROS and HPI.  ____________________________________________   PHYSICAL EXAM:  VITAL SIGNS: ED Triage Vitals [11/20/17 1317]  Enc Vitals Group     BP (!) 178/75     Pulse Rate 73     Resp 18     Temp 97.6 F (36.4 C)     Temp src      SpO2 100 %     Weight 102 lb (46.3 kg)     Height 5\' 2"  (1.575 m)     Head Circumference      Peak Flow      Pain Score 0     Pain Loc      Pain Edu?      Excl. in [place]?     Vital signs reviewed, nursing assessments reviewed.   Constitutional:   Alert and oriented. Non-toxic appearance. Eyes:   Conjunctivae are normal.  Pupils nonreactive.   Completely blind without light perception ENT      Head:   Normocephalic and atraumatic.      Nose:   No congestion/rhinnorhea.       Mouth/Throat:   Moist mucous membranes, no pharyngeal erythema. No peritonsillar mass.       Neck:   No meningismus. Full ROM. Hematological/Lymphatic/Immunilogical:   No cervical lymphadenopathy. Cardiovascular:   RRR. Symmetric bilateral radial and DP pulses.  No murmurs. Cap refill less than 2 seconds. Respiratory:   Normal respiratory effort without tachypnea/retractions. Breath sounds are clear and equal bilaterally. No wheezes/rales/rhonchi. Gastrointestinal:   Soft and nontender. Non distended. There is no CVA  tenderness.  No rebound, rigidity, or guarding. Musculoskeletal:   Normal range of motion in all extremities. No joint effusions.  No lower extremity tenderness.  No edema. Neurologic:   Normal speech and language.  Motor grossly intact. No acute focal neurologic deficits are appreciated.  Skin:    Skin is warm, dry and intact. No rash noted.  No petechiae, purpura, or bullae.  ____________________________________________    LABS (pertinent positives/negatives) (all labs ordered are listed, but only abnormal results are displayed) Labs Reviewed  CBC - Abnormal; Notable for the following components:      Result Value   RBC 3.72 (*)    All other components within normal limits  BASIC METABOLIC PANEL - Abnormal; Notable for the following components:   Sodium 130 (*)    Chloride 94 (*)    Glucose, Bld 129 (*)    GFR calc non Af Amer 59 (*)    All other components within normal limits  URINALYSIS, COMPLETE (UACMP) WITH MICROSCOPIC - Abnormal; Notable for the following components:   Color, Urine YELLOW (*)    APPearance CLOUDY (*)    Hgb urine dipstick SMALL (*)    Ketones, ur 5 (*)    Nitrite POSITIVE (*)    Leukocytes, UA LARGE (*)    WBC, UA >50 (*)    Bacteria, UA MANY (*)    All other components within normal limits  URINE CULTURE    ____________________________________________   EKG    ____________________________________________    RADIOLOGY  Dg Chest 1 View  Result Date: 11/20/2017 CLINICAL DATA:  Altered mental status, balance issues and weakness hallucinations EXAM: CHEST  1 VIEW COMPARISON:  06/05/2017, chest CT 01/30/2017, radiograph 01/30/2017 FINDINGS: Postsurgical changes of the mediastinum. Cardiomegaly with enlarged central pulmonary arteries. No focal consolidation. Possible tiny pleural effusion. Aortic atherosclerosis. No pneumothorax. IMPRESSION: 1. Stable cardiomegaly with prominent central pulmonary vessels and mild vascular congestion. Possible tiny pleural effusion. 2. No focal pulmonary infiltrate. Electronically Signed   By: Donavan Foil M.D.   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 Sagittal and coronal MPR images reconstructed from axial data set. COMPARISON:  08/23/2014 FINDINGS: Brain: Generalized atrophy. Normal ventricular morphology. No midline shift or mass effect. Small vessel chronic ischemic changes of deep cerebral white matter. Old LEFT parietal infarct. Old appearing LEFT thalamic infarct. Small old cerebellar infarcts. No intracranial hemorrhage, mass lesion, evidence of acute infarction, or extra-axial fluid collection. Vascular: Atherosclerotic calcifications at skull base. No hyperdense vessels. Skull: Osseous demineralization Sinuses/Orbits: Partial opacification of RIGHT sphenoid sinus. Remaining visualized paranasal sinuses and mastoid air cells clear Other: N/A IMPRESSION: Atrophy with small vessel chronic ischemic changes  of deep cerebral white matter. Old LEFT parietal, LEFT thalamic and BILATERAL cerebellar infarcts. No acute intracranial abnormalities. Electronically Signed   By: Lavonia Dana M.D.   On: 11/20/2017 15:37    ____________________________________________   PROCEDURES Procedures  ____________________________________________  DIFFERENTIAL DIAGNOSIS   Subdural hematoma, normal pressure hydrocephalus, stroke, UTI, pneumonia, metabolic derangement  CLINICAL IMPRESSION / ASSESSMENT AND PLAN / ED COURSE  Pertinent labs & imaging results that were available during my care of the patient were reviewed by me and considered in my medical decision  making (see chart for details).    Patient presents with being off balance, visual hallucinations.  No other focal complaints.  Check CT head, labs and UA.  Clinical Course as of Nov 21 1839  Wed Nov 20, 2017  1659 Labs unremarkable except UA dx UTI. Allergies and prior u. Cx. Resistance suggest augmentin as best option at this time. Will d/w spouse regarding ability to manage at home.    [PS]    Clinical Course User Index [PS] Carrie Mew, MD     ----------------------------------------- 6:47 PM on 11/20/2017 -----------------------------------------  Results discussed with family at bedside and the patient.  They are eager to take the patient home and continue her care there and follow-up with primary care.  I will start Augmentin.  Urine culture ordered.  No evidence of pyelonephritis or sepsis at this time.  Patient has good support at home.  ____________________________________________   FINAL CLINICAL IMPRESSION(S) / ED DIAGNOSES    Final diagnoses:  Cystitis  Altered mental status, unspecified altered mental status type     ED Discharge Orders        Ordered    amoxicillin-clavulanate (AUGMENTIN) 875-125 MG tablet  2 times daily     11/20/17 1727      Portions of this note were generated with dragon dictation  software. Dictation errors may occur despite best attempts at proofreading.    Carrie Mew, MD 11/20/17 [id number]

## 2017-11-20 NOTE — ED Notes (Signed)
Pt has been having issues with balance and weakness for the last few days, per pt's spouse. Pt began hallucinating today, seeing "trees and vines." Pt has been totally blind for 3 years. Pt has is having a harder time with ambulation. More than her normal. Spouse is with pt at bedside. Pt was vomiting on arrival to room.

## 2017-11-20 NOTE — ED Notes (Signed)
Assisted patient to the restroom on the bedpan. She was helpful in placement of bedpan; family assisted in re-positioning patient on the bed.

## 2017-11-20 NOTE — ED Notes (Signed)
Dr. Joni Fears to pt's bedside at this time.

## 2017-11-20 NOTE — ED Notes (Signed)
Resumed care from [place] rn.  Pt alert and talking with family.  Pt waiting on ct scan results.

## 2017-11-20 NOTE — ED Triage Notes (Addendum)
Pt arrives to ED with husband who states after eating breakfast this AM pt started to have visual hallucinatons. Pt states she's seeing "trees all around me and reeds." pt repeating self in triage. No fevers. C/o nausea from car ride. Pt has been blind x 3 years. No medication changes recently. Has had hallucinations before but unsure what caused it. Alert and oriented. Answering questions correctly. HOH

## 2017-11-20 NOTE — ED Notes (Signed)
Pt alert.  Drinking water   Family with pt.

## 2017-11-20 NOTE — ED Notes (Signed)
Patient transported to CT 

## 2017-11-22 LAB — URINE CULTURE

## 2017-12-04 DIAGNOSIS — F5101 Primary insomnia: Secondary | ICD-10-CM | POA: Insufficient documentation

## 2018-02-03 ENCOUNTER — Encounter (HOSPITAL_COMMUNITY): Payer: Self-pay

## 2018-02-03 ENCOUNTER — Emergency Department (HOSPITAL_COMMUNITY): Payer: Medicare Other

## 2018-02-03 ENCOUNTER — Emergency Department
Admission: EM | Admit: 2018-02-03 | Discharge: 2018-02-03 | Disposition: A | Discharge status: Home / Self Care | Payer: Medicare Other | Attending: EMERGENCY MEDICINE | Admitting: EMERGENCY MEDICINE

## 2018-02-03 ENCOUNTER — Other Ambulatory Visit: Payer: Self-pay

## 2018-02-03 DIAGNOSIS — Z79899 Other long term (current) drug therapy: Secondary | ICD-10-CM | POA: Insufficient documentation

## 2018-02-03 DIAGNOSIS — R410 Disorientation, unspecified: Secondary | ICD-10-CM

## 2018-02-03 DIAGNOSIS — N390 Urinary tract infection, site not specified: Secondary | ICD-10-CM | POA: Insufficient documentation

## 2018-02-03 DIAGNOSIS — R9431 Abnormal electrocardiogram [ECG] [EKG]: Secondary | ICD-10-CM

## 2018-02-03 DIAGNOSIS — Z882 Allergy status to sulfonamides status: Secondary | ICD-10-CM | POA: Insufficient documentation

## 2018-02-03 HISTORY — DX: Disorder of thyroid, unspecified: E07.9

## 2018-02-03 LAB — URINALYSIS, MACROSCOPIC
BILIRUBIN: NOT DETECTED mg/dL
GLUCOSE: NOT DETECTED mg/dL
KETONES: NOT DETECTED mg/dL
MONOCYTE ABSOLUTE: 0.11 mg/dL (ref 0.10–0.80)
NITRITE: NOT DETECTED
PH: 7 (ref 5.0–8.0)
PROTEIN: NOT DETECTED mg/dL
SPECIFIC GRAVITY: 1.006 (ref 1.005–1.030)
UROBILINOGEN: NOT DETECTED mg/dL

## 2018-02-03 LAB — ECG 12 LEAD
Atrial Rate: 76 {beats}/min
Calculated R Axis: -37 degrees
Calculated T Axis: 61 degrees
PR Interval: 160 ms
QRS Duration: 80 ms
QT Interval: 402 ms
QTC Calculation: 452 ms
Ventricular rate: 76 {beats}/min

## 2018-02-03 LAB — COMPREHENSIVE METABOLIC PANEL, NON-FASTING
ALBUMIN: 3.9 g/dL (ref 3.6–4.8)
ALKALINE PHOSPHATASE: 89 U/L (ref 38–126)
ALT (SGPT): 26 U/L (ref 3–45)
ANION GAP: 9 mmol/L
AST (SGOT): 25 U/L (ref 7–56)
BILIRUBIN TOTAL: 0.3 mg/dL (ref 0.2–1.3)
BUN/CREA RATIO: 24
BUN: 19 mg/dL — ABNORMAL HIGH (ref 7–18)
CALCIUM: 9.8 mg/dL (ref 8.5–10.3)
CHLORIDE: 105 mmol/L (ref 101–111)
CO2 TOTAL: 28 mmol/L (ref 22–31)
CREATININE: 0.8 mg/dL (ref 0.50–1.20)
ESTIMATED GFR: 67 mL/min/{1.73_m2} (ref >=60)
GLUCOSE: 115 mg/dL — ABNORMAL HIGH (ref 68–99)
POTASSIUM: 4.6 mmol/L (ref 3.6–5.0)
PROTEIN TOTAL: 6.7 g/dL (ref 6.2–8.0)
SODIUM: 142 mmol/L (ref 137–145)

## 2018-02-03 LAB — CBC
HCT: 33.4 % — ABNORMAL LOW (ref 37.0–47.0)
HGB: 11.2 g/dL — ABNORMAL LOW (ref 12.0–16.0)
MCH: 31.7 pg — ABNORMAL HIGH (ref 27.0–31.0)
MCHC: 33.7 g/dL (ref 33.0–37.0)
MCV: 94.2 fL (ref 80.0–99.0)
PLATELETS: 196 10*3/uL (ref 130–400)
RBC: 3.54 x10ˆ6/uL — ABNORMAL LOW (ref 4.20–5.40)
RDW: 13.4 % (ref 11.5–14.5)
WBC: 7.7 10*3/uL (ref 4.8–10.8)

## 2018-02-03 LAB — PT/INR
INR: 0.94 (ref <=4.00)
PROTHROMBIN TIME: 10.9 s (ref 9.7–13.6)

## 2018-02-03 LAB — URINALYSIS, MICROSCOPIC

## 2018-02-03 LAB — TROPONIN-I: TROPONIN I: <0.01 ng/mL (ref 0.00–0.03)

## 2018-02-03 LAB — PTT (PARTIAL THROMBOPLASTIN TIME): APTT: 31.9 s (ref 26.0–39.0)

## 2018-02-03 LAB — LDL CHOLESTEROL, DIRECT: LDL DIRECT: 96 mg/dL (ref 60–129)

## 2018-02-03 MED ORDER — CEFDINIR 300 MG CAPSULE
300.00 mg | ORAL_CAPSULE | Freq: Two times a day (BID) | ORAL | 0 refills | Status: AC
Start: 2018-02-03 — End: 2018-02-10

## 2018-02-03 MED ORDER — SODIUM CHLORIDE 0.9 % (FLUSH) INJECTION SYRINGE
3.0000 mL | INJECTION | Freq: Three times a day (TID) | INTRAMUSCULAR | Status: DC
Start: 2018-02-03 — End: 2018-02-03

## 2018-02-03 MED ORDER — CEFTRIAXONE 1 GRAM/50 ML IN DEXTROSE (ISO-OSMOT) INTRAVENOUS PIGGYBACK
1.0000 g | INJECTION | INTRAVENOUS | Status: AC
Start: 2018-02-03 — End: 2018-02-03
  Administered 2018-02-03: 1 g via INTRAVENOUS
  Administered 2018-02-03: 0 g via INTRAVENOUS
  Filled 2018-02-03: qty 50

## 2018-02-03 MED ORDER — SODIUM CHLORIDE 0.9 % (FLUSH) INJECTION SYRINGE
3.0000 mL | INJECTION | INTRAMUSCULAR | Status: DC | PRN
Start: 2018-02-03 — End: 2018-02-03

## 2018-02-03 MED ADMIN — buprenorphine 2 mg-naloxone 0.5 mg sublingual film: INTRAVENOUS | @ 21:00:00

## 2018-02-03 NOTE — ED Nurses Note (Signed)
Discharge instructions reviewed with pt.

## 2018-02-03 NOTE — ED Nurses Note (Signed)
Attempted with another nurse to straight cath patient for the obtainment of urine. Two attempts tried. Failure on both accounts, as catheter tip continued to curl back onto itself. Purewick placed. Provider notified. Provider agreed to treatment plan.

## 2018-02-03 NOTE — ED Provider Notes (Signed)
Emergency Department  Provider Note  HPI - 02/03/2018      Name: Alexis [place]  Age and Gender: 84 y.o. female  Attending: Dr. Dorris Fetch  APP: Levie Heritage, PA-C    PCP: Lacy Duverney, MD    History provided by: RN, patient    HPI:  Alexis [place] is a 84 y.o. female  who presents to the ED today via EMS for confusion. RN at bedside reports that the patient lives at home alone, but her family frequently calls to check on her. At 0730 this morning, family called. She was acting normally at that time. Family then visited the patient at approximately 1030 this morning, and at that time, family noted that the patient was confused with generalized shaking. These symptoms were noted to be abnormal, so family called EMS. Patient denies pain and has no complaints at this time. Hx of thyroid disease. Allergy to Sulfa. Denies smoking, ETOH, and drug use.     Location: Neuro  Quality: Confusion  Onset: This morning between 0730 and 1030  Severity: Denies pain  Timing: Still present  Context: See HPI  Modifying factors: None noted  Associated symptoms: Generalized shaking    Review of Systems:  Constitutional: +Generalized shaking. No fever, chills, weakness.  Skin: No rashes, lesions.  HENT: No head injury. No sore throat, ear pain, difficulty swallowing.  Eyes: No vision changes, redness, discharge.  Cardio: No chest pain, palpitations.   Respiratory: No cough, wheezing, SOB.  GI: No abdominal pain. No nausea/vomiting. No diarrhea, constipation.   GU: No dysuria, hematuria, polyuria.  MSK: No joint pain. No neck pain. No back pain.  Neuro: +Confusion. No headache. No loss of sensation, LOC.  All other systems reviewed and are negative, unless commented on in the HPI.      The below information was reviewed with the patient:     Current Medications:  Current Outpatient Medications   Medication Sig   . cefdinir (OMNICEF) 300 mg Oral Capsule Take 1 Cap (300 mg total) by mouth Twice daily for 7 days   .  levothyroxine (SYNTHROID) 75 mcg Oral Tablet Take 75 mcg by mouth Every morning       Allergies:   Allergies   Allergen Reactions   . Sulfa (Sulfonamides)  Other Adverse Reaction (Add comment)     Unknown       Past Medical History:  Past Medical History:   Diagnosis Date   . Thyroid disease        Past Surgical History:  History reviewed. No pertinent surgical history.    Social History:  Social History     Tobacco Use   . Smoking status: Never Smoker   . Smokeless tobacco: Never Used   Substance Use Topics   . Alcohol use: Never     Frequency: Never   . Drug use: Never     Social History     Substance and Sexual Activity   Drug Use Never       Family History:  No family history on file.    Old records were reviewed.    Objective:  Nursing notes were reviewed.    Filed Vitals:    02/03/18 2000 02/03/18 2015 02/03/18 2030 02/03/18 2045   BP: (!) 124/46 119/69 (!) 122/54 (!) 127/51   Pulse: 66      Resp: 15      Temp:       SpO2:  94% 95% 100%  Physical Exam:  Nursing note and vitals reviewed.  Vital signs reviewed as above.     Constitutional: Pt is well-developed and well-nourished.   Head: Normocephalic and atraumatic.   Eyes: Conjunctivae are normal. Pupils are equal, round, and reactive to light. EOM are intact.  Neck: Soft, supple, full range of motion.  Cardiovascular: RRR. No Murmurs/rubs/gallops. Distal pulses present and equal bilaterally.  Pulmonary/Chest: Normal BS BL with no distress. No audible wheezes or crackles are noted.  GI: Abdomen is soft, nontender, nondistended. No rebound, guarding, or masses.  Back: Nontender. Normal range of motion.   Extremities: Normal range of motion. No deformities. Exhibits no edema and no tenderness.   Skin: Warm and dry. No rash or lesions.  Neurological: Patient is slow to respond and appears disoriented. Alert.   Psychiatric: Patient has a normal mood and affect.     Plan:   Appropriate labs and/or imaging ordered. Medical Records reviewed.    Work-up:  Orders  Placed This Encounter   . CANCELED: URINE CULTURE,ROUTINE   . CT BRAIN WO IV CONTRAST   . CBC   . COMPREHENSIVE METABOLIC PANEL, NON-FASTING   . PTT (PARTIAL THROMBOPLASTIN TIME)   . PT/INR   . LDL CHOLESTEROL, DIRECT   . TROPONIN-I   . URINALYSIS, MACROSCOPIC   . URINALYSIS, MICROSCOPIC   . ECG 12 LEAD   . CANCELED: PERFORM POC WHOLE BLOOD GLUCOSE   . CANCELED: INSERT & MAINTAIN PERIPHERAL IV ACCESS   . cefTRIAXone (ROCEPHIN) 1 g in iso-osmotic 50 mL premix IVPB   . cefdinir (OMNICEF) 300 mg Oral Capsule        Labs:  No results found for this or any previous visit (from the past 24 hour(s)).    Abnormal Lab results:  Labs Reviewed   CBC - Abnormal; Notable for the following components:       Result Value    RBC 3.54 (*)     HGB 11.2 (*)     HCT 33.4 (*)     MCH 31.7 (*)     All other components within normal limits   COMPREHENSIVE METABOLIC PANEL, NON-FASTING - Abnormal; Notable for the following components:    BUN 19 (*)     GLUCOSE 115 (*)     All other components within normal limits   URINALYSIS, MACROSCOPIC - Abnormal; Notable for the following components:    BLOOD 3+ (*)     LEUKOCYTES 1+ (*)     All other components within normal limits   URINALYSIS, MICROSCOPIC - Abnormal; Notable for the following components:    WBCS 6-10 (*)     RBCS 11-25 (*)     All other components within normal limits   PTT (PARTIAL THROMBOPLASTIN TIME) - Normal   PT/INR - Normal   LDL CHOLESTEROL, DIRECT - Normal   TROPONIN-I - Normal       Imaging:   Results for orders placed or performed during the hospital encounter of 02/03/18 (from the past 72 hour(s))   CT BRAIN WO IV CONTRAST     Status: None    Narrative    Exam:CT of the head without contrast:    Clinical history:Mental status changes    Total [id number] y*cm    This CT scanner is equipped with dose reducing technology which  automatically reduces the MAS according to patient size to achieve the  lowest radiation exposure possible.      Noncontrasted CT axial images from the  skull base to the  cranial vertex are  obtained. There are no intra or extra-axial mass lesions, fluid  collections, or hematomas. There is no intraparenchymal or subarachnoid  hemorrhage. There is prominence of the subarachnoid spaces and ventricular  system consistent with diffuse parenchymal volume loss compatible with the  patient's age. Chronic microvascular ischemic changes are seen in the  periventricular white matter.      Impression    1. Age-appropriate central and cortical atrophy.  2. Chronic microvascular ischemic changes in the periventricular white  matter.  3. No acute intracranial process.        Radiologist location ID: [id number]         ECG:    ECG performed on 02/03/2018 at 15:01:40.   ECG was reviewed, and the interpretation is as follows - Rate: 76 NSR, LAD, NO STEMI.   Date/Time ECG Read: 10\14\ 2019 15:03    MDM:    During the patient's stay in the emergency department, the above listed imaging and/or labs were performed to assist with medical decision making and were reviewed by myself as available for review.    Patient rechecked and remained stable throughout the emergency department course.   Results discussed with patient.    All questions/concerns addressed, and patient agrees with disposition plan.    Instructed that patient should take prescribed medication(s) as directed.   Advised that patient return to ED immediately for any new or worsening symptoms; otherwise, follow up with PCP.       Impression:   Encounter Diagnosis   Name Primary?   . UTI (urinary tract infection) Yes     Disposition:   Discharged      Discussed with patient all lab and/or imaging results, diagnosis, treatment, and need for follow up.    Medication instructions were discussed with the patient.   It was advised that the patient return to the ED with any new, concerning, or worsening symptoms and follow up as directed.    The patient verbalized understanding of all instructions and had no further  questions or concerns.     Follow up:   Lacy Duverney, MD  [address]  [place] [address]  [phone]    Schedule an appointment as soon as possible for a visit in 1 week      Prescriptions:  Discharge Medication List as of 02/03/2018  8:42 PM      START taking these medications    Details   cefdinir (OMNICEF) 300 mg Oral Capsule Take 1 Cap (300 mg total) by mouth Twice daily for 7 days, Disp-14 Cap, R-0, Print             I am scribing for, and in the presence of, Levie Heritage, PA-C for services provided on 02/03/2018.  Britney Lipps, SCRIBE   [place], SCRIBE  02/03/2018, 15:01    I personally performed the services described in this documentation, as scribed in my presence, and it is both accurate and complete.   Levie Heritage, PA-C  The co-signing faculty was physically present in the emergency department and available for consultation and did not participate in the care of this patient.    Levie Heritage, PA-C  02/05/2018, 17:03

## 2018-02-03 NOTE — ED Triage Notes (Addendum)
Reportedly per EMS via family, the patient lives at home and they made telephonic contact with the patient this morning at approximately 0730 hours, and the patient was reportedly normal; however, they arrived at the patient's residence at approximately 1030 and the patient was confused. Reported baseline is that the patient is alert and oriented and simply takes a medication for hypothyroidism. Upon arrival, patient extremely slow to respond to questions and requires frequent stimulation to remain involved in conversation. However, patient is oriented to time and place. However, frequent forgets previous requests and statements. Therefore, reorientation to situation required.

## 2018-02-05 DIAGNOSIS — Z993 Dependence on wheelchair: Secondary | ICD-10-CM | POA: Insufficient documentation

## 2018-02-05 LAB — URINE CULTURE,ROUTINE: URINE CULTURE: 50000

## 2018-02-21 ENCOUNTER — Ambulatory Visit: Payer: Medicare Other | Admitting: Podiatry

## 2018-02-21 ENCOUNTER — Encounter: Payer: Self-pay | Admitting: Podiatry

## 2018-02-21 DIAGNOSIS — L989 Disorder of the skin and subcutaneous tissue, unspecified: Secondary | ICD-10-CM | POA: Diagnosis not present

## 2018-02-24 NOTE — Progress Notes (Signed)
   Subjective: 84 year old female presenting today with a chief complaint of burning pain in her toes bilaterally that has been present for the past few years. She states she has painful callus lesions on the toes that hurt more with walking. She has been applying Neosporin for treatment. Patient is here for further evaluation and treatment.  Past Medical History:  Diagnosis Date  . Aortic stenosis   . 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 Cancer ([place])    skin  . CHF (congestive heart failure) ([place])   . Coronary artery disease   . CVA (cerebral infarction) June '16  . Hypertension   . Liver transplanted ([place])   . Macular degeneration   . Mitral valve disease   . PBC (primary biliary cirrhosis)    s/p liver transplant  . Shingles   . TIA (transient ischemic attack)      Objective:  Physical Exam General: Alert and oriented x3 in no acute distress  Dermatology: Hyperkeratotic lesions present on the bilateral feet x 2. Pain on palpation with a central nucleated core noted. Skin is warm, dry and supple bilateral lower extremities. Negative for open lesions or macerations.  Vascular: Palpable pedal pulses bilaterally. No edema or erythema noted. Capillary refill within normal limits.  Neurological: Epicritic and protective threshold grossly intact bilaterally.   Musculoskeletal Exam: Pain on palpation at the keratotic lesion noted. Range of motion within normal limits bilateral. Muscle strength 5/5 in all groups bilateral.  Assessment: 1. Corn/callus bilateral feet x 2  Plan of Care:  1. Patient evaluated 2. Excisional debridement of keratoic lesion using a chisel blade was performed without incident.  3. Antibiotic ointment applied with a bandage.  4. Recommended good shoe gear.  5. Patient is to return to the clinic PRN.   Edrick Kins, DPM [facility]  Dr. Edrick Kins, [address]                                        [place], [place] [address]                 Office [phone]  Fax [phone]

## 2018-03-04 ENCOUNTER — Encounter: Payer: Self-pay | Admitting: Emergency Medicine

## 2018-03-04 ENCOUNTER — Emergency Department
Admission: EM | Admit: 2018-03-04 | Discharge: 2018-03-04 | Disposition: A | Discharge status: Home / Self Care | Payer: Medicare Other | Attending: Emergency Medicine | Admitting: Emergency Medicine

## 2018-03-04 ENCOUNTER — Other Ambulatory Visit: Payer: Self-pay

## 2018-03-04 ENCOUNTER — Emergency Department: Payer: Medicare Other

## 2018-03-04 DIAGNOSIS — Z7901 Long term (current) use of anticoagulants: Secondary | ICD-10-CM | POA: Diagnosis not present

## 2018-03-04 DIAGNOSIS — I509 Heart failure, unspecified: Secondary | ICD-10-CM | POA: Diagnosis not present

## 2018-03-04 DIAGNOSIS — Z7982 Long term (current) use of aspirin: Secondary | ICD-10-CM | POA: Diagnosis not present

## 2018-03-04 DIAGNOSIS — Z79899 Other long term (current) drug therapy: Secondary | ICD-10-CM | POA: Diagnosis not present

## 2018-03-04 DIAGNOSIS — I110 Hypertensive heart disease with heart failure: Secondary | ICD-10-CM | POA: Diagnosis not present

## 2018-03-04 DIAGNOSIS — Z8673 Personal history of transient ischemic attack (TIA), and cerebral infarction without residual deficits: Secondary | ICD-10-CM | POA: Diagnosis not present

## 2018-03-04 DIAGNOSIS — Z85828 Personal history of other malignant neoplasm of skin: Secondary | ICD-10-CM | POA: Insufficient documentation

## 2018-03-04 DIAGNOSIS — L299 Pruritus, unspecified: Secondary | ICD-10-CM | POA: Diagnosis present

## 2018-03-04 DIAGNOSIS — I2510 Atherosclerotic heart disease of native coronary artery without angina pectoris: Secondary | ICD-10-CM | POA: Insufficient documentation

## 2018-03-04 DIAGNOSIS — Z87891 Personal history of nicotine dependence: Secondary | ICD-10-CM | POA: Diagnosis not present

## 2018-03-04 LAB — DIFFERENTIAL
ABS IMMATURE GRANULOCYTES: 0.04 10*3/uL (ref 0.00–0.07)
BASOS PCT: 0 %
Basophils Absolute: 0 10*3/uL (ref 0.0–0.1)
Eosinophils Absolute: 0.2 10*3/uL (ref 0.0–0.5)
Eosinophils Relative: 4 %
Immature Granulocytes: 1 %
Lymphocytes Relative: 15 %
Lymphs Abs: 0.8 10*3/uL (ref 0.7–4.0)
Monocytes Absolute: 0.4 10*3/uL (ref 0.1–1.0)
Monocytes Relative: 7 %
NEUTROS ABS: 4.1 10*3/uL (ref 1.7–7.7)
Neutrophils Relative %: 73 %

## 2018-03-04 LAB — COMPREHENSIVE METABOLIC PANEL
ALK PHOS: 82 U/L (ref 38–126)
ALT: 18 U/L (ref 0–44)
ANION GAP: 11 (ref 5–15)
AST: 26 U/L (ref 15–41)
Albumin: 3.8 g/dL (ref 3.5–5.0)
BILIRUBIN TOTAL: 0.5 mg/dL (ref 0.3–1.2)
BUN: 15 mg/dL (ref 8–23)
CALCIUM: 9 mg/dL (ref 8.9–10.3)
CO2: 24 mmol/L (ref 22–32)
Chloride: 96 mmol/L — ABNORMAL LOW (ref 98–111)
Creatinine, Ser: 0.71 mg/dL (ref 0.44–1.00)
GFR calc non Af Amer: >60 mL/min (ref >60)
Glucose, Bld: 122 mg/dL — ABNORMAL HIGH (ref 70–99)
Potassium: 5.5 mmol/L — ABNORMAL HIGH (ref 3.5–5.1)
SODIUM: 131 mmol/L — AB (ref 135–145)
TOTAL PROTEIN: 7 g/dL (ref 6.5–8.1)

## 2018-03-04 LAB — CBC
HEMATOCRIT: 37.9 % (ref 36.0–46.0)
HEMOGLOBIN: 12.2 g/dL (ref 12.0–15.0)
MCH: 31.6 pg (ref 26.0–34.0)
MCHC: 32.2 g/dL (ref 30.0–36.0)
MCV: 98.2 fL (ref 80.0–100.0)
Platelets: 180 10*3/uL (ref 150–400)
RBC: 3.86 MIL/uL — ABNORMAL LOW (ref 3.87–5.11)
RDW: 12.8 % (ref 11.5–15.5)
WBC: 5.8 10*3/uL (ref 4.0–10.5)
nRBC: 0 % (ref 0.0–0.2)

## 2018-03-04 LAB — LIPASE, BLOOD: Lipase: 47 U/L (ref 11–51)

## 2018-03-04 LAB — TSH: TSH: 1.717 u[IU]/mL (ref 0.350–4.500)

## 2018-03-04 LAB — SEDIMENTATION RATE: Sed Rate: 6 mm/hr (ref 0–30)

## 2018-03-04 NOTE — ED Notes (Signed)
Pt is blind and in a wheelchair.  Pt brought in by husband.  Pt reports itching all over for 1 year.  Pt has seen dermatology and is taking vistaril.  Has not taken med today because it is not helping.  Pt reports itching of scalp, chest and legs and back.  No rash noted.  Pt alert.

## 2018-03-04 NOTE — Discharge Instructions (Addendum)
Please go to [facility] tomorrow as planned.  Please have them check our lab work here to get the results of the TSH sedimentation rate and differential which are not back yet. Please have them check the skin on her backwards different than elsewhere.  Please return here for any further problems.

## 2018-03-04 NOTE — ED Triage Notes (Signed)
Pt to ED with husband from home c/o scalp itching, has been seen by dermatologist and others and prescribed different treatments without relief.  States intermittent itching to arms and legs, denies rashes or bumps.  States recently on cipro for UTI.

## 2018-03-04 NOTE — ED Notes (Signed)
FIRST NURSE NOTE:  Pt here with husband pt in personal wheelchair, pt reports c/o itching that is worse over the past year.  Husband went to move car.

## 2018-03-04 NOTE — ED Provider Notes (Signed)
[facility] Emergency Department Provider Note   ____________________________________________   First MD Initiated Contact with Patient 03/04/18 1736     (approximate)  I have reviewed the triage vital signs and the nursing notes.   HISTORY  Chief Complaint Pruritis   HPI Brenda Roman is a 84 y.o. female patient with a history of itching for almost a year.  The itching is mostly on her scalp but also ends spots over the rest of her body.  She has seen dermatology with no help.  She is also seeing her primary care doctor.  After examining the patient I cannot find an explanation for it I will get some lab work as suggested by up-to-date and a chest x-ray.  Discussed the patient with her husband.  We will plan on getting her over to [facility] since she had liver transplant about 30 years ago.  They will carry on the work-up from there.  I have told him about the potassium which is somewhat high that will need to be checked again the next few days.   Past Medical History:  Diagnosis Date  . Aortic stenosis   . 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 Cancer ([place])    skin  . CHF (congestive heart failure) ([place])   . Coronary artery disease   . CVA (cerebral infarction) June '16  . Hypertension   . Liver transplanted ([place])   . Macular degeneration   . Mitral valve disease   . PBC (primary biliary cirrhosis)    s/p liver transplant  . Shingles   . TIA (transient ischemic attack)     Patient Active Problem List   Diagnosis Date Noted  . Wheelchair bound 02/05/2018  . Primary insomnia 12/04/2017  . Atrial fibrillation ([place]) 06/06/2017  . Chest pain 06/05/2017  . Dizziness 04/23/2017  . Diarrhea 04/23/2017  . UTI (urinary tract infection) 04/23/2017  . Acute diverticulitis 11/23/2016  . Arrhythmia, ventricular 09/19/2015  . Neuropathy 09/06/2015  . Hyponatremia 07/30/2015  . Hypo-osmolality and hyponatremia 07/30/2015  . Acute CHF (congestive heart  failure) ([place]) 07/18/2015  . Mitral valve stenosis, moderate 06/20/2015  . 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 Closed wedge fracture of lumbar vertebra ([place]) 10/11/2014  . Ischemic stroke ([place]) 10/07/2014  . Acute lower UTI 10/07/2014  . 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 History of stroke 10/07/2014  . Recurrent UTI 10/07/2014  . Closed fracture of humerus, surgical neck 09/29/2014  . Goals of care, counseling/discussion   . Acute encephalopathy 08/29/2014  . Hypoxia 08/29/2014  . Healthcare-associated pneumonia 08/29/2014  . Pneumonia 08/29/2014  . CVA (cerebral infarction) 08/23/2014  . Arteriosclerosis of coronary artery 03/15/2014  . Paroxysmal atrial fibrillation ([place]) 03/15/2014  . Atherosclerosis of abdominal aorta ([place]) 01/27/2014  . HLD (hyperlipidemia) 04/07/2012  . BP (high blood pressure) 04/07/2012  . History of liver transplant ([place]) 04/03/2012  . Encounter for other procedures for purposes other than remedying health state 04/03/2012  . Immunosuppression ([place]) 04/03/2012  . Prolapse of urethra 03/10/2012  . Pelvic relaxation due to uterovaginal prolapse 03/10/2012    Past Surgical History:  Procedure Laterality Date  . BOWEL RESECTION    . COLONOSCOPY    . CORONARY ARTERY BYPASS GRAFT    . JOINT REPLACEMENT    . LIVER TRANSPLANT  1990    Prior to Admission medications   Medication Sig Start Date End Date Taking? Authorizing Provider  acetaminophen (TYLENOL) 325 MG tablet Take 2  tablets by mouth every 6 (six) hours as needed. Reported on 10/26/2015    [provider]  aspirin EC 81 MG tablet Take 81 mg by mouth daily.    [provider]  docusate sodium (COLACE) 50 MG capsule Take 100 mg by mouth daily as needed for mild constipation.     [provider]  fluocinonide (LIDEX) 0.05 % external solution  02/03/18   [provider]  hydrocortisone 2.5 % cream  02/06/18   [provider]  hydrOXYzine  (ATARAX/VISTARIL) 25 MG tablet Take by mouth. 02/19/18   [provider]  ketoconazole (NIZORAL) 2 % shampoo  02/03/18   [provider]  metoprolol succinate (TOPROL-XL) 50 MG 24 hr tablet Take 7.5 mg by mouth daily. Take with or immediately following a meal.    [provider]  mycophenolate (CELLCEPT) 500 MG tablet Take 500 mg by mouth every 12 (twelve) hours.    [provider]  nitroGLYCERIN (NITROSTAT) 0.4 MG SL tablet Place 1 tablet under the tongue as needed. Reported on 10/26/2015 06/20/15   [provider]  polyethylene glycol (MIRALAX / GLYCOLAX) packet Take 17 g by mouth daily as needed.    [provider]  predniSONE (DELTASONE) 5 MG tablet Take 5 mg by mouth daily.    [provider]  rivaroxaban (XARELTO) 20 MG TABS tablet Take by mouth.    [provider]  valACYclovir (VALTREX) 500 MG tablet Take 500 mg by mouth daily as needed.     [provider]  zaleplon (SONATA) 5 MG capsule Take 1 capsule by mouth at bedtime as needed for sleep.  08/28/17   [provider]  zolpidem (AMBIEN) 5 MG tablet Take 1 tablet by mouth at bedtime. 11/06/17   [provider]    Allergies Cephalexin; Trazodone; Zaleplon; Zolpidem; Novocain [procaine]; and Sulfa antibiotics  Family History  Problem Relation Age of Onset  . Hypertension Father   . Heart attack Father   . Stroke Mother     Social History Social History   Tobacco Use  . Smoking status: Former Research scientist (life sciences)  . Smokeless tobacco: Never Used  Substance Use Topics  . Alcohol use: No  . Drug use: No    Review of Systems  Constitutional: No fever/chills Eyes: No visual changes. ENT: No sore throat. Cardiovascular: Denies chest pain. Respiratory: Denies shortness of breath. Gastrointestinal: No abdominal pain.  No nausea, no vomiting.  No diarrhea.  No constipation. Genitourinary: Negative for dysuria. Musculoskeletal: Negative for back  pain. Skin: Negative for rash. Neurological: Negative for headaches, focal weakness   ____________________________________________   PHYSICAL EXAM:  VITAL SIGNS: ED Triage Vitals [03/04/18 1612]  Enc Vitals Group     BP (!) 170/86     Pulse Rate 71     Resp 18     Temp 98.2 F (36.8 C)     Temp Source Oral     SpO2 100 %     Weight 104 lb (47.2 kg)     Height 5\' 2"  (1.575 m)     Head Circumference      Peak Flow      Pain Score 0     Pain Loc      Pain Edu?      Excl. in [place]?     Constitutional: Alert and oriented. Well appearing and in no acute distress. Eyes: Conjunctivae are normal. Head: Atraumatic.  Scalp looks normal except for where she is been scratching with  slightly reddened Nose: No congestion/rhinnorhea. Mouth/Throat: Mucous membranes are moist.  Oropharynx non-erythematous. Neck: No stridor. Cardiovascular: Normal rate, regular rhythm. Grossly normal heart sounds.  Good peripheral circulation. Respiratory: Normal respiratory effort.  No retractions. Lungs CTAB. Gastrointestinal: Soft and nontender. No distention. No abdominal bruits. No CVA tenderness. Musculoskeletal: No lower extremity tenderness nor edema.  No joint effusions. Neurologic:  Normal speech and language. No gross focal neurologic deficits are appreciated. No gait instability. Skin:  Skin is warm, dry and intact. No rash noted except from the mid back where there is an area about a foot high and foot wide which is reddish with very small 1 to 2 mm white papules.  Not sure what this is.  The itchy parts of her body really do not have anything like this.Marland Kitchen Psychiatric: Mood and affect are normal. Speech and behavior are normal.  ____________________________________________   LABS (all labs ordered are listed, but only abnormal results are displayed)  Labs Reviewed  CBC - Abnormal; Notable for the following components:      Result Value   RBC 3.86 (*)    All other components within normal  limits  COMPREHENSIVE METABOLIC PANEL - Abnormal; Notable for the following components:   Sodium 131 (*)    Potassium 5.5 (*)    Chloride 96 (*)    Glucose, Bld 122 (*)    All other components within normal limits  LIPASE, BLOOD  TSH  SEDIMENTATION RATE  DIFFERENTIAL   ____________________________________________  EKG   ____________________________________________  RADIOLOGY  ED MD interpretation: Chest x-ray read by radiology reviewed by me does not seem to show any acute findings  Official radiology report(s): Dg Chest 2 View  Result Date: 03/04/2018 CLINICAL DATA:  Chronic inching. Evaluate for lymphadenopathy. EXAM: CHEST - 2 VIEW COMPARISON:  11/20/2017 FINDINGS: Stable cardiomegaly with tortuous atherosclerotic aorta. Diffuse increase in interstitial lung markings consistent with chronic interstitial lung disease is noted with probable areas of subsegmental atelectasis in the right mid lower lung. Numerous surgical clips project over the cardiac and mediastinal silhouette. Osteoporotic compression deformities of thoracolumbar spine are demonstrated. IMPRESSION: 1. Diffuse increase in interstitial lung markings consistent with chronic interstitial lung disease. No acute pulmonary abnormality. No lymphadenopathy. 2. Stable cardiomegaly with tortuous atherosclerotic aorta. 3. Remote appearing osteoporotic compression deformities of thoracolumbar spine. Electronically Signed   By: Ashley Royalty M.D.   On: 03/04/2018 18:21    ____________________________________________   PROCEDURES  Procedure(s) performed:   Procedures  Critical Care performed:   ____________________________________________   INITIAL IMPRESSION / ASSESSMENT AND PLAN / ED COURSE  Patient is getting very tired and cranky and wants to go home.  Her husband plans to take her to [facility] tomorrow.  I will let them go and give them instructions to have [facility] be sure to check lab results that are not back yet.  I  will also have them check the skin on her back which seems to be reddish with small papules.  This is different than elsewhere on the rest of her body.     Clinical Course as of Mar 04 1838  Tue Mar 04, 2018  2993 Differential [PM]  7169 Differential [PM]  6789 Sedimentation rate [PM]  1822 Sedimentation rate [PM]  1822 Sedimentation rate [PM]    Clinical Course User Index [PM] Nena Polio, MD     ____________________________________________   FINAL CLINICAL IMPRESSION(S) / ED DIAGNOSES  Final diagnoses:  Pruritus     ED Discharge Orders  None       Note:  This document was prepared using Dragon voice recognition software and may include unintentional dictation errors.    Nena Polio, MD 03/04/18 [phone]

## 2018-03-25 ENCOUNTER — Emergency Department
Admission: EM | Admit: 2018-03-25 | Discharge: 2018-03-25 | Disposition: A | Discharge status: Home / Self Care | Attending: Emergency Medicine | Admitting: Emergency Medicine

## 2018-03-25 ENCOUNTER — Other Ambulatory Visit: Payer: Self-pay

## 2018-03-25 ENCOUNTER — Encounter: Payer: Self-pay | Admitting: Emergency Medicine

## 2018-03-25 DIAGNOSIS — I110 Hypertensive heart disease with heart failure: Secondary | ICD-10-CM | POA: Insufficient documentation

## 2018-03-25 DIAGNOSIS — Z79899 Other long term (current) drug therapy: Secondary | ICD-10-CM | POA: Diagnosis not present

## 2018-03-25 DIAGNOSIS — Z87891 Personal history of nicotine dependence: Secondary | ICD-10-CM | POA: Diagnosis not present

## 2018-03-25 DIAGNOSIS — L299 Pruritus, unspecified: Secondary | ICD-10-CM

## 2018-03-25 DIAGNOSIS — I509 Heart failure, unspecified: Secondary | ICD-10-CM | POA: Diagnosis not present

## 2018-03-25 DIAGNOSIS — Z7982 Long term (current) use of aspirin: Secondary | ICD-10-CM | POA: Insufficient documentation

## 2018-03-25 DIAGNOSIS — Z951 Presence of aortocoronary bypass graft: Secondary | ICD-10-CM | POA: Insufficient documentation

## 2018-03-25 DIAGNOSIS — R21 Rash and other nonspecific skin eruption: Secondary | ICD-10-CM | POA: Diagnosis present

## 2018-03-25 LAB — CBC WITH DIFFERENTIAL/PLATELET
ABS IMMATURE GRANULOCYTES: 0.04 10*3/uL (ref 0.00–0.07)
BASOS ABS: 0.1 10*3/uL (ref 0.0–0.1)
BASOS PCT: 1 %
Eosinophils Absolute: 0.3 10*3/uL (ref 0.0–0.5)
Eosinophils Relative: 4 %
HCT: 37.5 % (ref 36.0–46.0)
Hemoglobin: 12 g/dL (ref 12.0–15.0)
IMMATURE GRANULOCYTES: 1 %
LYMPHS PCT: 15 %
Lymphs Abs: 1.1 10*3/uL (ref 0.7–4.0)
MCH: 31.4 pg (ref 26.0–34.0)
MCHC: 32 g/dL (ref 30.0–36.0)
MCV: 98.2 fL (ref 80.0–100.0)
Monocytes Absolute: 0.6 10*3/uL (ref 0.1–1.0)
Monocytes Relative: 8 %
NEUTROS ABS: 5 10*3/uL (ref 1.7–7.7)
NEUTROS PCT: 71 %
NRBC: 0 % (ref 0.0–0.2)
PLATELETS: 204 10*3/uL (ref 150–400)
RBC: 3.82 MIL/uL — AB (ref 3.87–5.11)
RDW: 12.8 % (ref 11.5–15.5)
WBC: 7.1 10*3/uL (ref 4.0–10.5)

## 2018-03-25 LAB — COMPREHENSIVE METABOLIC PANEL
ALT: 12 U/L (ref 0–44)
AST: 18 U/L (ref 15–41)
Albumin: 3.6 g/dL (ref 3.5–5.0)
Alkaline Phosphatase: 79 U/L (ref 38–126)
Anion gap: 7 (ref 5–15)
BILIRUBIN TOTAL: 0.4 mg/dL (ref 0.3–1.2)
BUN: 13 mg/dL (ref 8–23)
CHLORIDE: 100 mmol/L (ref 98–111)
CO2: 23 mmol/L (ref 22–32)
Calcium: 8.7 mg/dL — ABNORMAL LOW (ref 8.9–10.3)
Creatinine, Ser: 0.73 mg/dL (ref 0.44–1.00)
GFR calc Af Amer: >60 mL/min (ref >60)
Glucose, Bld: 89 mg/dL (ref 70–99)
POTASSIUM: 4.5 mmol/L (ref 3.5–5.1)
Sodium: 130 mmol/L — ABNORMAL LOW (ref 135–145)
Total Protein: 6.6 g/dL (ref 6.5–8.1)

## 2018-03-25 NOTE — ED Notes (Signed)
Pt with intermittent itching, resolved at this time. No s/s of skin issues. Denies pain.

## 2018-03-25 NOTE — ED Triage Notes (Signed)
Pt to ED from home c/o itching mainly to head but intermittently all over states been for over a year.  Pt states takes hydroxizine for itching with some relief but only takes it at night, has been prescribed haldol today but has not started taking it.

## 2018-03-25 NOTE — ED Provider Notes (Addendum)
[facility] Emergency Department Provider Note  ___________________________________________   First MD Initiated Contact with Patient 03/25/18 1614     (approximate)  I have reviewed the triage vital signs and the nursing notes.   HISTORY  Chief Complaint Pruritis  HPI Brenda Roman is a 84 y.o. female with a long-standing history of pruritus over the past 1 to 2 years was presented to the emergency department for worsening pruritus.  Patient says that she is itching to her head as well as to her trunk and limbs.  No rash detected.  Patient says that she hears bugs occasionally and has had these sensations appearing bugs over the past 6 months.  She is been evaluated by dermatology and given a "cream" for her symptoms which has not resolved the symptoms.  Patient says that she takes hydroxyzine in the evenings and can take up to 3 times a day but she is only take it at night.  Says the hydroxyzine helps her symptoms.  She is also been on hospice for approximately 1 week and has been prescribed Haldol but is refused to take it because of potential side effects.  Patient also has decreased her CellCept to 1 times a day instead of 2 times per day on her own over the past month.  She says that she has been seen by hepatology here at [facility] and is no longer seen at [facility].  Has a remote history of liver transplantation.   Past Medical History:  Diagnosis Date  . Aortic stenosis   . 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 Cancer ([place])    skin  . CHF (congestive heart failure) ([place])   . Coronary artery disease   . CVA (cerebral infarction) June '16  . Hypertension   . Liver transplanted ([place])   . Macular degeneration   . Mitral valve disease   . PBC (primary biliary cirrhosis)    s/p liver transplant  . Shingles   . TIA (transient ischemic attack)     Patient Active Problem List   Diagnosis Date Noted  . Wheelchair bound 02/05/2018  . Primary insomnia  12/04/2017  . Atrial fibrillation ([place]) 06/06/2017  . Chest pain 06/05/2017  . Dizziness 04/23/2017  . Diarrhea 04/23/2017  . UTI (urinary tract infection) 04/23/2017  . Acute diverticulitis 11/23/2016  . Arrhythmia, ventricular 09/19/2015  . Neuropathy 09/06/2015  . Hyponatremia 07/30/2015  . Hypo-osmolality and hyponatremia 07/30/2015  . Acute CHF (congestive heart failure) ([place]) 07/18/2015  . Mitral valve stenosis, moderate 06/20/2015  . 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 Closed wedge fracture of lumbar vertebra ([place]) 10/11/2014  . Ischemic stroke ([place]) 10/07/2014  . Acute lower UTI 10/07/2014  . 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 History of stroke 10/07/2014  . Recurrent UTI 10/07/2014  . Closed fracture of humerus, surgical neck 09/29/2014  . Goals of care, counseling/discussion   . Acute encephalopathy 08/29/2014  . Hypoxia 08/29/2014  . Healthcare-associated pneumonia 08/29/2014  . Pneumonia 08/29/2014  . CVA (cerebral infarction) 08/23/2014  . Arteriosclerosis of coronary artery 03/15/2014  . Paroxysmal atrial fibrillation ([place]) 03/15/2014  . Atherosclerosis of abdominal aorta ([place]) 01/27/2014  . HLD (hyperlipidemia) 04/07/2012  . BP (high blood pressure) 04/07/2012  . History of liver transplant ([place]) 04/03/2012  . Encounter for other procedures for purposes other than remedying health state 04/03/2012  . Immunosuppression ([place]) 04/03/2012  . Prolapse of urethra 03/10/2012  . Pelvic relaxation due to uterovaginal prolapse 03/10/2012  Past Surgical History:  Procedure Laterality Date  . BOWEL RESECTION    . COLONOSCOPY    . CORONARY ARTERY BYPASS GRAFT    . JOINT REPLACEMENT    . LIVER TRANSPLANT  1990    Prior to Admission medications   Medication Sig Start Date End Date Taking? Authorizing Provider  acetaminophen (TYLENOL) 325 MG tablet Take 2 tablets by mouth every 6 (six) hours as needed. Reported on 10/26/2015    [provider]    aspirin EC 81 MG tablet Take 81 mg by mouth daily.    [provider]  docusate sodium (COLACE) 50 MG capsule Take 100 mg by mouth daily as needed for mild constipation.     [provider]  fluocinonide (LIDEX) 0.05 % external solution  02/03/18   [provider]  hydrocortisone 2.5 % cream  02/06/18   [provider]  hydrOXYzine (ATARAX/VISTARIL) 25 MG tablet Take by mouth. 02/19/18   [provider]  ketoconazole (NIZORAL) 2 % shampoo  02/03/18   [provider]  metoprolol succinate (TOPROL-XL) 50 MG 24 hr tablet Take 7.5 mg by mouth daily. Take with or immediately following a meal.    [provider]  mycophenolate (CELLCEPT) 500 MG tablet Take 500 mg by mouth every 12 (twelve) hours.    [provider]  nitroGLYCERIN (NITROSTAT) 0.4 MG SL tablet Place 1 tablet under the tongue as needed. Reported on 10/26/2015 06/20/15   [provider]  polyethylene glycol (MIRALAX / GLYCOLAX) packet Take 17 g by mouth daily as needed.    [provider]  predniSONE (DELTASONE) 5 MG tablet Take 5 mg by mouth daily.    [provider]  rivaroxaban (XARELTO) 20 MG TABS tablet Take by mouth.    [provider]  valACYclovir (VALTREX) 500 MG tablet Take 500 mg by mouth daily as needed.     [provider]  zaleplon (SONATA) 5 MG capsule Take 1 capsule by mouth at bedtime as needed for sleep.  08/28/17   [provider]  zolpidem (AMBIEN) 5 MG tablet Take 1 tablet by mouth at bedtime. 11/06/17   [provider]    Allergies Cephalexin; Trazodone; Zaleplon; Zolpidem; Novocain [procaine]; and Sulfa antibiotics  Family History  Problem Relation Age of Onset  . Hypertension Father   . Heart attack Father   . Stroke Mother     Social History Social History   Tobacco Use  . Smoking status: Former Research scientist (life sciences)  . Smokeless tobacco: Never Used  Substance Use Topics  . Alcohol use:  No  . Drug use: No    Review of Systems  Constitutional: No fever/chills Eyes: No visual changes. ENT: No sore throat. Cardiovascular: Denies chest pain. Respiratory: Denies shortness of breath. Gastrointestinal: No abdominal pain.  No nausea, no vomiting.  No diarrhea.  No constipation. Genitourinary: Negative for dysuria. Musculoskeletal: Negative for back pain. Skin: Negative for rash. Neurological: Negative for headaches, focal weakness or numbness.   ____________________________________________   PHYSICAL EXAM:  VITAL SIGNS: ED Triage Vitals [03/25/18 1550]  Enc Vitals Group     BP 138/73     Pulse Rate 74     Resp 18     Temp 98.2 F (36.8 C)     Temp Source Oral     SpO2 94 %     Weight 103 lb 9.9 oz (47 kg)     Height 5\' 2"  (1.575 m)     Head Circumference  Peak Flow      Pain Score 0     Pain Loc      Pain Edu?      Excl. in [place]?     Constitutional: Alert and oriented. in no acute distress. Eyes: Conjunctivae are normal.  Head: Atraumatic.  Scalp without any lesions, rash or skin flaking.  No erythema. Nose: No congestion/rhinnorhea. Mouth/Throat: Mucous membranes are moist.  No intraoral lesions. Neck: No stridor.   Cardiovascular: Normal rate, regular rhythm. Grossly normal heart sounds.  Respiratory: Normal respiratory effort.  No retractions. Lungs CTAB. Gastrointestinal: Soft and nontender. No distention.  Musculoskeletal: No lower extremity tenderness nor edema.  No joint effusions. Neurologic:  Normal speech and language. No gross focal neurologic deficits are appreciated. Skin:  Skin is warm, dry and intact. No rash noted.  No lesions to the palms of the soles. Psychiatric: Mood and affect are normal. Speech and behavior are normal.  ____________________________________________   LABS (all labs ordered are listed, but only abnormal results are displayed)  Labs Reviewed  COMPREHENSIVE METABOLIC PANEL - Abnormal; Notable for the  following components:      Result Value   Sodium 130 (*)    Calcium 8.7 (*)    All other components within normal limits  CBC WITH DIFFERENTIAL/PLATELET - Abnormal; Notable for the following components:   RBC 3.82 (*)    All other components within normal limits   ____________________________________________  EKG   ____________________________________________  RADIOLOGY   ____________________________________________   PROCEDURES  Procedure(s) performed:   Procedures  Critical Care performed:   ____________________________________________   INITIAL IMPRESSION / ASSESSMENT AND PLAN / ED COURSE  Pertinent labs & imaging results that were available during my care of the patient were reviewed by me and considered in my medical decision making (see chart for details).  DDX: Dermatitis, pruritus, hallucinations, psychosis, medication noncompliance As part of my medical decision making, I reviewed the following data within the [facility] Notes from prior ED visits  Patient without any noted rash on my exam.  Symptoms ongoing for between 6 months and 2 years.  I recommended that she take her medications as prescribed including the hydroxyzine as well as the Haldol.  Patient to follow-up with her outpatient physicians.  Will be discharged at this time.  I also recommended the patient that she take her CellCept as prescribed. ____________________________________________   FINAL CLINICAL IMPRESSION(S) / ED DIAGNOSES  Pruritus  NEW MEDICATIONS STARTED DURING THIS VISIT:  New Prescriptions   No medications on file     Note:  This document was prepared using Dragon voice recognition software and may include unintentional dictation errors.     Orbie Pyo, MD 03/25/18 [place], Randall An, MD 03/25/18 [phone]

## 2018-04-17 ENCOUNTER — Encounter (INDEPENDENT_AMBULATORY_CARE_PROVIDER_SITE_OTHER): Payer: Self-pay | Admitting: Family Medicine

## 2018-04-17 DIAGNOSIS — M1990 Unspecified osteoarthritis, unspecified site: Secondary | ICD-10-CM | POA: Insufficient documentation

## 2018-04-17 DIAGNOSIS — E039 Hypothyroidism, unspecified: Secondary | ICD-10-CM

## 2018-04-17 DIAGNOSIS — I350 Nonrheumatic aortic (valve) stenosis: Secondary | ICD-10-CM

## 2018-04-17 DIAGNOSIS — L719 Rosacea, unspecified: Secondary | ICD-10-CM

## 2018-04-17 DIAGNOSIS — R7989 Other specified abnormal findings of blood chemistry: Secondary | ICD-10-CM | POA: Insufficient documentation

## 2018-05-01 ENCOUNTER — Ambulatory Visit (INDEPENDENT_AMBULATORY_CARE_PROVIDER_SITE_OTHER): Payer: Medicare Other | Admitting: Family Medicine

## 2018-05-01 ENCOUNTER — Other Ambulatory Visit (INDEPENDENT_AMBULATORY_CARE_PROVIDER_SITE_OTHER): Payer: Medicare Other

## 2018-05-01 ENCOUNTER — Other Ambulatory Visit: Payer: Medicare Other | Attending: Family Medicine | Admitting: Family Medicine

## 2018-05-01 ENCOUNTER — Encounter (INDEPENDENT_AMBULATORY_CARE_PROVIDER_SITE_OTHER): Payer: Self-pay | Admitting: Family Medicine

## 2018-05-01 VITALS — BP 118/70 | HR 112 | Temp 97.7°F | Resp 20 | Ht 64.5 in | Wt 81.0 lb

## 2018-05-01 DIAGNOSIS — E039 Hypothyroidism, unspecified: Secondary | ICD-10-CM

## 2018-05-01 DIAGNOSIS — R109 Unspecified abdominal pain: Secondary | ICD-10-CM

## 2018-05-01 DIAGNOSIS — R634 Abnormal weight loss: Secondary | ICD-10-CM

## 2018-05-01 DIAGNOSIS — E559 Vitamin D deficiency, unspecified: Secondary | ICD-10-CM

## 2018-05-01 DIAGNOSIS — N390 Urinary tract infection, site not specified: Secondary | ICD-10-CM

## 2018-05-01 DIAGNOSIS — R7989 Other specified abnormal findings of blood chemistry: Secondary | ICD-10-CM

## 2018-05-01 DIAGNOSIS — I350 Nonrheumatic aortic (valve) stenosis: Principal | ICD-10-CM

## 2018-05-01 DIAGNOSIS — Z23 Encounter for immunization: Secondary | ICD-10-CM

## 2018-05-01 DIAGNOSIS — M1990 Unspecified osteoarthritis, unspecified site: Secondary | ICD-10-CM

## 2018-05-01 LAB — VITAMIN D 25 TOTAL: VITAMIN D 25, TOTAL: 37.1 ng/mL (ref 30.00–100.00)

## 2018-05-01 LAB — CBC WITH DIFF
HCT: 40.5 % (ref 32.8–48.8)
HGB: 12.9 g/dL (ref 10.0–15.6)
MCH: 30 pg (ref 25.9–32.7)
MCHC: 31.9 g/dL (ref 31.0–37.0)
MCV: 94.2 fL (ref 82.3–99.0)
PLATELETS: 170 10*3/uL (ref 129–381)
RBC: 4.3 10*6/uL (ref 3.54–5.34)
RDW: 13.6 % (ref 10.8–15.2)
WBC: 7.5 10*3/uL (ref 3.1–11.1)
WBC: 7.5 x10?3/uL (ref 3.1–11.1)

## 2018-05-01 LAB — MANUAL DIFFERENTIAL
LYMPHOCYTE %: 16 % — ABNORMAL LOW (ref 25–40)
LYMPHOCYTE ABSOLUTE: 1.2 10*3/uL (ref 1.00–3.70)
MONOCYTE %: 13 % — ABNORMAL HIGH (ref 0–10)
MONOCYTE ABSOLUTE: 0.98 10*3/uL — ABNORMAL HIGH (ref 0.00–0.70)
NEUTROPHIL %: 71 % (ref 50–73)
NEUTROPHIL ABSOLUTE: 5.33 10*3/uL (ref 1.50–7.00)
PLATELET MORPHOLOGY COMMENT: NORMAL
RBC MORPHOLOGY COMMENT: NORMAL
WBC: 7.5 10*3/uL

## 2018-05-01 LAB — COMPREHENSIVE METABOLIC PNL, FASTING
ALBUMIN: 4.6 g/dL (ref 3.5–5.0)
ALKALINE PHOSPHATASE: 98 U/L (ref 38–126)
ALT (SGPT): 15 U/L (ref <=35)
ANION GAP: 12 mmol/L
AST (SGOT): 27 U/L (ref 14–36)
BILIRUBIN TOTAL: 0.5 mg/dL (ref 0.2–5.0)
BUN/CREA RATIO: 26
BUN: 26 mg/dL — ABNORMAL HIGH (ref 7–17)
CALCIUM: 10.2 mg/dL (ref 8.4–10.2)
CHLORIDE: 103 mmol/L (ref 98–107)
CO2 TOTAL: 25 mmol/L (ref 22–30)
CREATININE: 0.99 mg/dL (ref 0.52–1.04)
ESTIMATED GFR: 50 mL/min/{1.73_m2} — ABNORMAL LOW (ref >=60)
GLUCOSE: 111 mg/dL — ABNORMAL HIGH (ref 74–106)
POTASSIUM: 5 mmol/L (ref 3.5–5.1)
PROTEIN TOTAL: 8 g/dL (ref 6.3–8.2)
SODIUM: 140 mmol/L (ref 137–145)

## 2018-05-01 LAB — URINALYSIS, MACRO/MICRO
BILIRUBIN: NEGATIVE mg/dL
GLUCOSE: NEGATIVE mg/dL
KETONES: NEGATIVE mg/dL
NITRITE: NEGATIVE
PH: 5.5 (ref 5.0–8.0)
SPECIFIC GRAVITY: 1.023 (ref 1.005–1.030)
UROBILINOGEN: 0.2 mg/dL

## 2018-05-01 LAB — LIPID PANEL
CHOLESTEROL: 178 mg/dL (ref 0–199)
HDL CHOL: 58 mg/dL (ref 40–60)
LDL CALC: 102 mg/dL (ref 0–130)
TRIGLYCERIDES: 89 mg/dL (ref 0–149)
VLDL CALC: 18 mg/dL

## 2018-05-01 LAB — THYROXINE, FREE (FREE T4): THYROXINE (T4), FREE: 1.29 ng/dL (ref 0.78–2.19)

## 2018-05-01 LAB — THYROID STIMULATING HORMONE (SENSITIVE TSH)
TSH: 8.21 u[IU]/mL — ABNORMAL HIGH (ref 0.465–4.680)
TSH: 8.21 u[IU]/mL — ABNORMAL HIGH (ref 0.465–4.680)

## 2018-05-01 LAB — HELICOBACTER PYLORI IGG: HELICOBACTER PYLORI IGG: NEGATIVE

## 2018-05-01 LAB — URINALYSIS, MICROSCOPIC

## 2018-05-01 LAB — MAGNESIUM: MAGNESIUM: 2 mg/dL (ref 1.9–2.3)

## 2018-05-01 MED ORDER — PANTOPRAZOLE 40 MG TABLET,DELAYED RELEASE
40.0000 mg | DELAYED_RELEASE_TABLET | Freq: Every day | ORAL | 0 refills | Status: AC
Start: 2018-05-01 — End: 2018-05-31

## 2018-05-01 NOTE — Nursing Note (Signed)
Department of [employer]     Patient received vaccine in clinic.  Tolerated it well, given VIS sheet and was discharged to home.  Immunization administered     Name Date Dose VIS Date Route    Influenza Vaccine High Dose IM 05/01/2018 0.5 mL 12/05/2017 Intramuscular    Site: Left deltoid    Given By: Velva Harman, MA    Manufacturer: [employer]    Lot: [id number]    NDC: [id number]            Velva Harman, MA  05/01/2018, 11:37

## 2018-05-01 NOTE — Progress Notes (Signed)
PRIMARY CARE, [facility]  [address] [place] [address]    History and Physical     Name: Alexis Stevens MRN:  [id number]   Date: 05/01/2018 Age: 85 y.o.           PCP: Lacy Duverney, MD     Reason for Visit: General (Patient here for 1 year follow up.  Takes thyroid medication for hypothyroidism.  Patient has lost weight and not eating well.  Memory has gotten worse.  Patient not talking much at visit.  [name] daughter in law is present with patient. ) and ED Follow-up (Oct went to [facility] for UTI.  Requests urine to be checked.)    History of Present Illness  Alexis Stevens is a 85 y.o. female who is being seen today for routine follow-up.  See above.  Daughter-in-law states became very ill in October.  Abdominal pain etc.  Found to have UTI.  Did well with medications.  However memory continues to worsen with age.  No dangerous behaviors but they note she sleeps a great deal now.  Does have a walker with a seat.  Does still live alone in states she is very careful with no falls or injuries.  However really does not eating well.  States has not hungry.  Does snack once in a while.  However has lost over 15 lb.  States bowels are doing well and denies any urinary complaints and denies any fevers or chills.  Over does complain of but epigastric pain.  States cannot really describe it just hurts.  No more like a burning.  States has had ulcers in the past.  Does get slightly confused however not sure if this is the same pain not sure if it gets better when she eats or worse when she eats.  Again no bowel changes and no melena.  Does take Synthroid.  Has help with medications through family.  They note however that is not always consistent with this.  Not wanting any further workup on thyroid or with her aortic stenosis.  Denies chest pain or palpitations.  No real shortness of breath.  No peripheral edema.  No longer following with Cardiology.  Past Medical History:   Diagnosis Date   .  Aortic stenosis    . Hypothyroidism    . Low vitamin D level    . Osteoarthritis    . Osteoporosis    . Rosacea    . Thyroid disease          Past Surgical History:   Procedure Laterality Date   . HIP ARTHROSCOPY     . LITHOTRIPSY           levothyroxine (SYNTHROID) 25 mcg Oral Tablet, TAKE 1 TABLET BY MOUTH ONCE DAILY. TAKE FIRST THING IN THE MORNING ON AN EMPTY STOMACH. 30 TO 60 MINUTES PRIOR TO ANY OTHER MEDICATIONS WITH  levothyroxine (SYNTHROID) 75 mcg Oral Tablet, Take 75 mcg by mouth Every morning    No facility-administered medications prior to visit.     Allergies   Allergen Reactions   . Sulfa (Sulfonamides)  Other Adverse Reaction (Add comment)     Unknown     Family Medical History:     Problem Relation (Age of Onset)    Arthritis-osteo Mother    Breast Cancer Mother    Emphysema Father    Glaucoma Other    No Known Problems Sister, Brother, Maternal Grandmother, Maternal Grandfather, Paternal Grandmother, Paternal Grandfather  Other Mother    Stroke Father            Social History     Tobacco Use   . Smoking status: Never Smoker   . Smokeless tobacco: Never Used   Substance Use Topics   . Alcohol use: Never     Frequency: Never   . Drug use: Never       Review of Systems  Review of Systems   Constitution: Positive for malaise/fatigue and weight loss. Negative for chills and fever.   Cardiovascular: Negative for chest pain, dyspnea on exertion and palpitations.   Respiratory: Negative for cough and shortness of breath.    Gastrointestinal: Positive for abdominal pain. Negative for change in bowel habit, hematemesis and melena.   Genitourinary: Negative for dysuria and frequency.   Neurological: Positive for weakness. Negative for dizziness and focal weakness.   Psychiatric/Behavioral: Positive for memory loss.       Physical Exam:  BP 118/70 (Site: Left, Patient Position: Sitting, Cuff Size: Adult)   Pulse (!) 112   Temp 36.5 C (97.7 F) (Tympanic)   Resp 20   Ht 1.638 m (5' 4.5")   Wt (!) 36.7  kg (81 lb)   BMI 13.69 kg/m       Physical Exam   Constitutional: She appears distressed.   Neck: Neck supple. No JVD present. No thyromegaly present.   Cardiovascular: Normal rate and regular rhythm.   Murmur heard.   Systolic murmur is present with a grade of 2/6.  Pulmonary/Chest: Effort normal and breath sounds normal. She has no wheezes.   Abdominal: Bowel sounds are normal. She exhibits no mass. There is abdominal tenderness.   Mild midepigastric tenderness without masses or rebound    Musculoskeletal:         General: No edema.      Comments: Does well with walker   Neurological: She is alert.   Psychiatric: Affect normal.   Vitals reviewed.      Assessment/Plan:    1. Aortic stenosis  Asymptomatic at present.  With age and wishes no further workup    2. Low vitamin D level  Continue to monitor levels restart supplements if needed  - VITAMIN D 25 TOTAL; Future    3. Hypothyroidism, unspecified type  Monitor labs adjust medicines based on results, keeping in mind she will will miss a dose usually once a week or so  - LIPID PANEL; Future  - THYROID STIMULATING HORMONE (SENSITIVE TSH); Future  - THYROXINE, FREE (FREE T4); Future    4. Osteoarthritis, unspecified osteoarthritis type, unspecified site  Fairly stable continue with safety    5. Abdominal pain, unspecified abdominal location  Concern gastritis or other.  Will send in PPI for 1 month although she is resistant to taking medications.  Check labs and H pylori.  Further treatment based on results.  - CBC/DIFF; Future  - MAGNESIUM; Future  - COMPREHENSIVE METABOLIC PNL, FASTING; Future  - HELICOBACTER PYLORI IGG; Future    6. UTI (urinary tract infection)  Repeat UA and culture treat if positive  - URINALYSIS WITH REFLEX MICROSCOPIC AND CULTURE IF POSITIVE; Future    7. Weight loss  Discussed dietary with patient.  Told her she can drink more than 1 Ensure a day and work with other ways of increasing calories.  However monitor labs to assure thyroid or  other not contributing.       Orders Placed This Encounter   . Influenza Vaccine IM -  High Dose Ages 65+ only (Admin)   . CBC/DIFF   . LIPID PANEL   . MAGNESIUM   . COMPREHENSIVE METABOLIC PNL, FASTING   . THYROID STIMULATING HORMONE (SENSITIVE TSH)   . THYROXINE, FREE (FREE T4)   . VITAMIN D 25 TOTAL   . URINALYSIS WITH REFLEX MICROSCOPIC AND CULTURE IF POSITIVE   . HELICOBACTER PYLORI IGG   . pantoprazole (PROTONIX) 40 mg Oral Tablet, Delayed Release (E.C.)              Follow up: Return in about 3 months (around 07/31/2018), or if symptoms worsen or fail to improve.  Seek medical attention for new or worsening symptoms.  Patient has been seen in this clinic within the last 3 years.     Lacy Duverneyatherine Mischell Branford, MD          This note was partially created using MModal Fluency Direct system (voice recognition software) and is inherently subject to errors including those of syntax and "sound-alike" substitutions which may escape proofreading.  In such instances, original meaning may be extrapolated by contextual derivation.

## 2018-05-02 ENCOUNTER — Telehealth (INDEPENDENT_AMBULATORY_CARE_PROVIDER_SITE_OTHER): Payer: Self-pay | Admitting: Family Medicine

## 2018-05-02 ENCOUNTER — Other Ambulatory Visit (INDEPENDENT_AMBULATORY_CARE_PROVIDER_SITE_OTHER): Payer: Self-pay | Admitting: Family Medicine

## 2018-05-02 DIAGNOSIS — N390 Urinary tract infection, site not specified: Secondary | ICD-10-CM

## 2018-05-02 MED ORDER — CEPHALEXIN 500 MG CAPSULE: 500 mg | Cap | Freq: Two times a day (BID) | ORAL | 0 refills | 0 days | Status: AC

## 2018-05-02 MED ORDER — CEPHALEXIN 500 MG CAPSULE
500.00 mg | ORAL_CAPSULE | Freq: Two times a day (BID) | ORAL | 0 refills | Status: DC
Start: 2018-05-02 — End: 2018-05-02

## 2018-05-02 NOTE — Progress Notes (Signed)
05/02/2018  Left message to call back ( judy)  [name], MA

## 2018-05-02 NOTE — Telephone Encounter (Signed)
Called [name] and left message  [name], [place]

## 2018-05-02 NOTE — Telephone Encounter (Signed)
-----   Message from Catherine Adkins, MD sent at 05/01/2018  9:09 PM EST -----  All overall ok - tsh borderline but has missed some doses - ua is concerning for infection though - would start keflex 500 mg po bid for 5 days but will await culture and adjust if needed

## 2018-05-02 NOTE — Telephone Encounter (Signed)
Spoke with patient's daughter she reports understanding.

## 2018-05-02 NOTE — Telephone Encounter (Signed)
-----   Message from Lacy Duverney, MD sent at 05/01/2018  9:09 PM EST -----  All overall ok - tsh borderline but has missed some doses - ua is concerning for infection though - would start keflex 500 mg po bid for 5 days but will await culture and adjust if needed

## 2018-05-03 LAB — URINE CULTURE,ROUTINE: URINE CULTURE: 50000

## 2018-05-05 ENCOUNTER — Telehealth (INDEPENDENT_AMBULATORY_CARE_PROVIDER_SITE_OTHER): Payer: Self-pay | Admitting: Family Medicine

## 2018-05-05 NOTE — Telephone Encounter (Signed)
-----   Message from Lacy Duverney, MD sent at 05/04/2018  8:44 PM EST -----  Normal growth probable contaminant - is she better with abx?

## 2018-05-05 NOTE — Progress Notes (Signed)
05/05/2018  Per [name] pt is feeling better with antibiotic.  Advised to continue to monitor and let us know if she needs anything.  [name], MA

## 2018-05-05 NOTE — Progress Notes (Signed)
05/05/2018  Called  Judy and left message to call back  [name], [place]

## 2018-05-05 NOTE — Telephone Encounter (Signed)
05/05/2018  Per [name] patient is feeling better since starting abx.  [name] aware to call if any questions or concerns.  [name], MA

## 2018-05-20 ENCOUNTER — Ambulatory Visit: Payer: Medicare Other | Admitting: Podiatry

## 2018-05-21 ENCOUNTER — Emergency Department
Admission: EM | Admit: 2018-05-21 | Discharge: 2018-05-21 | Discharge status: Against Medical Advice | Payer: Medicare Other

## 2018-05-23 ENCOUNTER — Emergency Department: Payer: Medicare Other

## 2018-05-23 ENCOUNTER — Emergency Department
Admission: EM | Admit: 2018-05-23 | Discharge: 2018-05-23 | Disposition: A | Discharge status: Home / Self Care | Payer: Medicare Other | Attending: Emergency Medicine | Admitting: Emergency Medicine

## 2018-05-23 ENCOUNTER — Other Ambulatory Visit: Payer: Self-pay

## 2018-05-23 DIAGNOSIS — Z87891 Personal history of nicotine dependence: Secondary | ICD-10-CM | POA: Diagnosis not present

## 2018-05-23 DIAGNOSIS — Z79899 Other long term (current) drug therapy: Secondary | ICD-10-CM | POA: Diagnosis not present

## 2018-05-23 DIAGNOSIS — I110 Hypertensive heart disease with heart failure: Secondary | ICD-10-CM | POA: Insufficient documentation

## 2018-05-23 DIAGNOSIS — Z7982 Long term (current) use of aspirin: Secondary | ICD-10-CM | POA: Diagnosis not present

## 2018-05-23 DIAGNOSIS — N390 Urinary tract infection, site not specified: Secondary | ICD-10-CM | POA: Insufficient documentation

## 2018-05-23 DIAGNOSIS — I509 Heart failure, unspecified: Secondary | ICD-10-CM | POA: Insufficient documentation

## 2018-05-23 DIAGNOSIS — Z7901 Long term (current) use of anticoagulants: Secondary | ICD-10-CM | POA: Insufficient documentation

## 2018-05-23 DIAGNOSIS — I2510 Atherosclerotic heart disease of native coronary artery without angina pectoris: Secondary | ICD-10-CM | POA: Diagnosis not present

## 2018-05-23 DIAGNOSIS — R531 Weakness: Secondary | ICD-10-CM

## 2018-05-23 LAB — CBC WITH DIFFERENTIAL/PLATELET
Abs Immature Granulocytes: 0.05 10*3/uL (ref 0.00–0.07)
BASOS PCT: 0 %
Basophils Absolute: 0 10*3/uL (ref 0.0–0.1)
EOS ABS: 0.1 10*3/uL (ref 0.0–0.5)
EOS PCT: 1 %
HCT: 37.5 % (ref 36.0–46.0)
Hemoglobin: 12.2 g/dL (ref 12.0–15.0)
Immature Granulocytes: 1 %
Lymphocytes Relative: 13 %
Lymphs Abs: 0.9 10*3/uL (ref 0.7–4.0)
MCH: 30.7 pg (ref 26.0–34.0)
MCHC: 32.5 g/dL (ref 30.0–36.0)
MCV: 94.2 fL (ref 80.0–100.0)
MONOS PCT: 5 %
Monocytes Absolute: 0.4 10*3/uL (ref 0.1–1.0)
Neutro Abs: 5.5 10*3/uL (ref 1.7–7.7)
Neutrophils Relative %: 80 %
PLATELETS: 199 10*3/uL (ref 150–400)
RBC: 3.98 MIL/uL (ref 3.87–5.11)
RDW: 12.4 % (ref 11.5–15.5)
WBC: 6.8 10*3/uL (ref 4.0–10.5)
nRBC: 0 % (ref 0.0–0.2)

## 2018-05-23 LAB — COMPREHENSIVE METABOLIC PANEL
ALT: 16 U/L (ref 0–44)
AST: 19 U/L (ref 15–41)
Albumin: 3.8 g/dL (ref 3.5–5.0)
Alkaline Phosphatase: 94 U/L (ref 38–126)
Anion gap: 4 — ABNORMAL LOW (ref 5–15)
BUN: 13 mg/dL (ref 8–23)
CO2: 27 mmol/L (ref 22–32)
Calcium: 8.6 mg/dL — ABNORMAL LOW (ref 8.9–10.3)
Chloride: 99 mmol/L (ref 98–111)
Creatinine, Ser: 0.72 mg/dL (ref 0.44–1.00)
GFR calc Af Amer: >60 mL/min (ref >60)
GFR calc non Af Amer: >60 mL/min (ref >60)
Glucose, Bld: 122 mg/dL — ABNORMAL HIGH (ref 70–99)
Potassium: 5 mmol/L (ref 3.5–5.1)
Sodium: 130 mmol/L — ABNORMAL LOW (ref 135–145)
Total Bilirubin: 0.6 mg/dL (ref 0.3–1.2)
Total Protein: 6.8 g/dL (ref 6.5–8.1)

## 2018-05-23 LAB — URINALYSIS, COMPLETE (UACMP) WITH MICROSCOPIC
Bilirubin Urine: NEGATIVE
GLUCOSE, UA: NEGATIVE mg/dL
HGB URINE DIPSTICK: NEGATIVE
Ketones, ur: NEGATIVE mg/dL
Nitrite: NEGATIVE
PH: 7 (ref 5.0–8.0)
PROTEIN: NEGATIVE mg/dL
Specific Gravity, Urine: 1.005 (ref 1.005–1.030)

## 2018-05-23 LAB — TROPONIN I: TROPONIN I: 0.05 ng/mL — AB (ref <0.03)

## 2018-05-23 MED ORDER — SODIUM CHLORIDE 0.9 % IV BOLUS
1000.0000 mL | Freq: Once | INTRAVENOUS | Status: AC
  Administered 2018-05-23: 1000 mL via INTRAVENOUS

## 2018-05-23 MED ORDER — LEVOFLOXACIN IN D5W 500 MG/100ML IV SOLN
500.0000 mg | Freq: Once | INTRAVENOUS | Status: AC
  Administered 2018-05-23: 500 mg via INTRAVENOUS
  Filled 2018-05-23: qty 100

## 2018-05-23 MED ORDER — CIPROFLOXACIN HCL 500 MG PO TABS: 500.0000 mg | ORAL_TABLET | Freq: Once | ORAL | Status: DC

## 2018-05-23 MED ORDER — LEVOFLOXACIN 250 MG PO TABS: 250.0000 mg | ORAL_TABLET | Freq: Every day | ORAL | 0 refills | Status: AC

## 2018-05-23 NOTE — ED Triage Notes (Signed)
Pt arrives via ems from home. Ems states pt has been experiencing weakness and generally not feeling well with dizziness x 2 days. Weakness ongoing x 2 months. Pt wheelchair bound and unable to ambulate at home, pt unable to bear weight at all on feet x 2 years. EMS reports pt does not normally have elevated BP. Pt blind and a&o x 4 on arrival. NAD on arrival

## 2018-05-23 NOTE — ED Provider Notes (Signed)
[facility] Emergency Department Provider Note  Time seen: 3:34 PM  I have reviewed the triage vital signs and the nursing notes.   HISTORY  Chief Complaint Weakness    HPI Brenda Roman is a 85 y.o. female with a past medical history of CHF, CAD, CVA, hypertension, lives at home with her husband presents to the emergency department for increased weakness.  According to patient and EMS report patient has been feeling weak over the past 2 years which is typical for her but over the past 2 days has been feeling more weak than normal.  Denies any fever, cough, congestion, vomiting, diarrhea, dysuria.  Overall the patient appears well, per EMS report husband states she does have mild confusion at baseline.  Patient states she has a history of low sodium at times which can cause her to feel very weak.   Past Medical History:  Diagnosis Date  . Aortic stenosis   . 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 Cancer ([place])    skin  . CHF (congestive heart failure) ([place])   . Coronary artery disease   . CVA (cerebral infarction) June '16  . Hypertension   . Liver transplanted ([place])   . Macular degeneration   . Mitral valve disease   . PBC (primary biliary cirrhosis)    s/p liver transplant  . Shingles   . TIA (transient ischemic attack)     Patient Active Problem List   Diagnosis Date Noted  . Wheelchair bound 02/05/2018  . Primary insomnia 12/04/2017  . Atrial fibrillation ([place]) 06/06/2017  . Chest pain 06/05/2017  . Dizziness 04/23/2017  . Diarrhea 04/23/2017  . UTI (urinary tract infection) 04/23/2017  . Acute diverticulitis 11/23/2016  . Arrhythmia, ventricular 09/19/2015  . Neuropathy 09/06/2015  . Hyponatremia 07/30/2015  . Hypo-osmolality and hyponatremia 07/30/2015  . Acute CHF (congestive heart failure) ([place]) 07/18/2015  . Mitral valve stenosis, moderate 06/20/2015  . 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 Closed wedge fracture of lumbar vertebra  ([place]) 10/11/2014  . Ischemic stroke ([place]) 10/07/2014  . Acute lower UTI 10/07/2014  . 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 History of stroke 10/07/2014  . Recurrent UTI 10/07/2014  . Closed fracture of humerus, surgical neck 09/29/2014  . Goals of care, counseling/discussion   . Acute encephalopathy 08/29/2014  . Hypoxia 08/29/2014  . Healthcare-associated pneumonia 08/29/2014  . Pneumonia 08/29/2014  . CVA (cerebral infarction) 08/23/2014  . Arteriosclerosis of coronary artery 03/15/2014  . Paroxysmal atrial fibrillation ([place]) 03/15/2014  . Atherosclerosis of abdominal aorta ([place]) 01/27/2014  . HLD (hyperlipidemia) 04/07/2012  . BP (high blood pressure) 04/07/2012  . History of liver transplant ([place]) 04/03/2012  . Encounter for other procedures for purposes other than remedying health state 04/03/2012  . Immunosuppression ([place]) 04/03/2012  . Prolapse of urethra 03/10/2012  . Pelvic relaxation due to uterovaginal prolapse 03/10/2012    Past Surgical History:  Procedure Laterality Date  . BOWEL RESECTION    . COLONOSCOPY    . CORONARY ARTERY BYPASS GRAFT    . JOINT REPLACEMENT    . LIVER TRANSPLANT  1990    Prior to Admission medications   Medication Sig Start Date End Date Taking? Authorizing Provider  acetaminophen (TYLENOL) 325 MG tablet Take 2 tablets by mouth every 6 (six) hours as needed. Reported on 10/26/2015    [provider]  aspirin EC 81 MG tablet Take 81 mg by mouth daily.    [provider]  docusate  sodium (COLACE) 50 MG capsule Take 100 mg by mouth daily as needed for mild constipation.     [provider]  fluocinonide (LIDEX) 0.05 % external solution  02/03/18   [provider]  hydrocortisone 2.5 % cream  02/06/18   [provider]  hydrOXYzine (ATARAX/VISTARIL) 25 MG tablet Take by mouth. 02/19/18   [provider]  ketoconazole (NIZORAL) 2 % shampoo  02/03/18   [provider]  metoprolol succinate  (TOPROL-XL) 50 MG 24 hr tablet Take 7.5 mg by mouth daily. Take with or immediately following a meal.    [provider]  mycophenolate (CELLCEPT) 500 MG tablet Take 500 mg by mouth every 12 (twelve) hours.    [provider]  nitroGLYCERIN (NITROSTAT) 0.4 MG SL tablet Place 1 tablet under the tongue as needed. Reported on 10/26/2015 06/20/15   [provider]  polyethylene glycol (MIRALAX / GLYCOLAX) packet Take 17 g by mouth daily as needed.    [provider]  predniSONE (DELTASONE) 5 MG tablet Take 5 mg by mouth daily.    [provider]  rivaroxaban (XARELTO) 20 MG TABS tablet Take by mouth.    [provider]  valACYclovir (VALTREX) 500 MG tablet Take 500 mg by mouth daily as needed.     [provider]  zaleplon (SONATA) 5 MG capsule Take 1 capsule by mouth at bedtime as needed for sleep.  08/28/17   [provider]  zolpidem (AMBIEN) 5 MG tablet Take 1 tablet by mouth at bedtime. 11/06/17   [provider]    Allergies  Allergen Reactions  . Cephalexin Other (See Comments)    Reaction:  Unknown  . Trazodone     Other reaction(s): Other (See Comments) Bad Dreams   . Zaleplon     Other reaction(s): Other (See Comments) Weird Dreams  . Zolpidem     Other reaction(s): Other (See Comments) Memory, worried her to take it  . Novocain [Procaine] Rash  . Sulfa Antibiotics Rash    Family History  Problem Relation Age of Onset  . Hypertension Father   . Heart attack Father   . Stroke Mother     Social History Social History   Tobacco Use  . Smoking status: Former Research scientist (life sciences)  . Smokeless tobacco: Never Used  Substance Use Topics  . Alcohol use: No  . Drug use: No    Review of Systems Constitutional: Negative for fever. Cardiovascular: Negative for chest pain. Respiratory: Negative for shortness of breath.  Negative for cough. Gastrointestinal: Negative for abdominal pain, vomiting and  diarrhea. Genitourinary: Negative for urinary compaints Musculoskeletal: Negative for musculoskeletal complaints Skin: Negative for skin complaints  Neurological: Negative for headache All other ROS negative  ____________________________________________   PHYSICAL EXAM:  Constitutional: Alert and oriented. Well appearing and in no distress. Eyes: Normal exam ENT   Head: Normocephalic and atraumatic.   Mouth/Throat: Mucous membranes are moist. Cardiovascular: Normal rate, regular rhythm.  Respiratory: Normal respiratory effort without tachypnea nor retractions. Breath sounds are clear  Gastrointestinal: Soft and nontender. No distention.  Musculoskeletal: Nontender with normal range of motion in all extremities.  Neurologic:  Normal speech and language. No gross focal neurologic deficits Skin:  Skin is warm, dry and intact.  Psychiatric: Mood and affect are normal.   ____________________________________________    EKG  EKG viewed and interpreted by myself shows a sinus rhythm at 62 bpm with a widened QRS, normal axis, morphology most consistent with left bundle branch block.  History of left bundle branch block per old EKG review.  ____________________________________________    RADIOLOGY  IMPRESSION: 1. Findings consistent with mild congestive heart failure superimposed on chronic interstitial thickening. 2. Mild stable cardiomegaly and stable changes from prior CABG surgery. 3. Peripheral airspace opacity in the right mid to lower lung represents a change from prior studies. This may be due to atelectasis or focal edema or a small area of loculated fluid. Pneumonia is possible.  ____________________________________________   INITIAL IMPRESSION / ASSESSMENT AND PLAN / ED COURSE  Pertinent labs & imaging results that were available during my care of the patient were reviewed by me and considered in my medical decision making (see chart for  details).  Patient presents to the emergency department for generalized weakness over the past 2 days.  Differential would include electrolyte or metabolic abnormality, infectious etiology such as pneumonia or urinary tract infection, dehydration.  We will check labs, EKG, chest x-ray, urine sample.  We will IV hydrate while awaiting results.  Awaiting husband arrival for further history as well.  Patient is lab work is largely reassuring besides a urinalysis which appears consistent with a urinary tract infection.  Patient's chest x-ray shows mild CHF, there is also atelectasis versus now past the in the right lower lobe.  Patient has no pneumonia symptoms.  We will cover the patient with Levaquin which should have good respiratory as well as urinary coverage.  I will send a urine culture.  Family is here with the patient and they are agreeable to this plan of care.  ____________________________________________   FINAL CLINICAL IMPRESSION(S) / ED DIAGNOSES  Weakness Urinary tract infection   Harvest Dark, MD 05/23/18 1700

## 2018-05-23 NOTE — ED Notes (Signed)
Date and time results received: 05/23/18   Test: troponin Critical Value: 0.05  Name of Provider Notified: Paduchowski   Orders Received? Or Actions Taken?: MD notified

## 2018-05-23 NOTE — ED Notes (Addendum)
PT called out reporting pain at IV site. Upon assessment IV has infiltrated. NS stopped by this RN and new IV established on left arm. Fluids restarted at a slower rate. RN made aware.

## 2018-05-23 NOTE — ED Notes (Signed)
PT placed on bedpan by this RN. PT able to urinate and had a small BM. Pt cleaned and depends changes. PT resting at this time with husband at bedside. Call bell in reach and bed locked and in lowest position.

## 2018-05-23 NOTE — ED Notes (Signed)
PT requesting to leave while RN and charge RN were involved in a code stroke. Pts family reports they will take pt home and verbalized understanding of discharge papers. ED staff assited pt to car.

## 2018-05-26 LAB — URINE CULTURE

## 2018-06-05 ENCOUNTER — Other Ambulatory Visit (INDEPENDENT_AMBULATORY_CARE_PROVIDER_SITE_OTHER): Payer: Self-pay | Admitting: Family Medicine

## 2018-06-05 MED ORDER — LEVOTHYROXINE 25 MCG TABLET: Tab | ORAL | 1 refills | 0 days | Status: AC

## 2018-06-05 NOTE — Telephone Encounter (Signed)
Last scheduled appointment with you was 05/01/2018.  Currently scheduled future appointment is Visit date not found.      [employer], MA  06/05/2018, 08:00

## 2018-06-14 ENCOUNTER — Emergency Department: Payer: Medicare Other

## 2018-06-14 ENCOUNTER — Observation Stay
Admission: EM | Admit: 2018-06-14 | Discharge: 2018-06-15 | Disposition: A | Discharge status: Home / Self Care | Payer: Medicare Other | Attending: Internal Medicine | Admitting: Internal Medicine

## 2018-06-14 ENCOUNTER — Other Ambulatory Visit: Payer: Self-pay

## 2018-06-14 DIAGNOSIS — Z8673 Personal history of transient ischemic attack (TIA), and cerebral infarction without residual deficits: Secondary | ICD-10-CM | POA: Insufficient documentation

## 2018-06-14 DIAGNOSIS — Z79899 Other long term (current) drug therapy: Secondary | ICD-10-CM | POA: Insufficient documentation

## 2018-06-14 DIAGNOSIS — Z882 Allergy status to sulfonamides status: Secondary | ICD-10-CM | POA: Diagnosis not present

## 2018-06-14 DIAGNOSIS — I110 Hypertensive heart disease with heart failure: Secondary | ICD-10-CM | POA: Insufficient documentation

## 2018-06-14 DIAGNOSIS — I4891 Unspecified atrial fibrillation: Secondary | ICD-10-CM | POA: Diagnosis not present

## 2018-06-14 DIAGNOSIS — E860 Dehydration: Secondary | ICD-10-CM | POA: Diagnosis present

## 2018-06-14 DIAGNOSIS — I2510 Atherosclerotic heart disease of native coronary artery without angina pectoris: Secondary | ICD-10-CM | POA: Diagnosis not present

## 2018-06-14 DIAGNOSIS — Z66 Do not resuscitate: Secondary | ICD-10-CM | POA: Diagnosis not present

## 2018-06-14 DIAGNOSIS — I700 Atherosclerosis of aorta: Secondary | ICD-10-CM | POA: Diagnosis not present

## 2018-06-14 DIAGNOSIS — R531 Weakness: Secondary | ICD-10-CM | POA: Diagnosis present

## 2018-06-14 DIAGNOSIS — I080 Rheumatic disorders of both mitral and aortic valves: Secondary | ICD-10-CM | POA: Diagnosis not present

## 2018-06-14 DIAGNOSIS — Z944 Liver transplant status: Secondary | ICD-10-CM | POA: Insufficient documentation

## 2018-06-14 DIAGNOSIS — Z881 Allergy status to other antibiotic agents status: Secondary | ICD-10-CM | POA: Insufficient documentation

## 2018-06-14 DIAGNOSIS — I509 Heart failure, unspecified: Secondary | ICD-10-CM | POA: Diagnosis not present

## 2018-06-14 LAB — URINALYSIS, COMPLETE (UACMP) WITH MICROSCOPIC
BACTERIA UA: NONE SEEN
BILIRUBIN URINE: NEGATIVE
Glucose, UA: NEGATIVE mg/dL
Hgb urine dipstick: NEGATIVE
Ketones, ur: NEGATIVE mg/dL
NITRITE: NEGATIVE
Protein, ur: NEGATIVE mg/dL
SPECIFIC GRAVITY, URINE: 1.021 (ref 1.005–1.030)
pH: 5 (ref 5.0–8.0)

## 2018-06-14 LAB — CBC WITH DIFFERENTIAL/PLATELET
Abs Immature Granulocytes: 0.06 10*3/uL (ref 0.00–0.07)
Basophils Absolute: 0 10*3/uL (ref 0.0–0.1)
Basophils Relative: 0 %
EOS ABS: 0 10*3/uL (ref 0.0–0.5)
EOS PCT: 0 %
HEMATOCRIT: 38.9 % (ref 36.0–46.0)
HEMOGLOBIN: 12.7 g/dL (ref 12.0–15.0)
Immature Granulocytes: 1 %
LYMPHS ABS: 0.7 10*3/uL (ref 0.7–4.0)
LYMPHS PCT: 9 %
MCH: 30 pg (ref 26.0–34.0)
MCHC: 32.6 g/dL (ref 30.0–36.0)
MCV: 92 fL (ref 80.0–100.0)
MONO ABS: 0.2 10*3/uL (ref 0.1–1.0)
Monocytes Relative: 2 %
Neutro Abs: 7 10*3/uL (ref 1.7–7.7)
Neutrophils Relative %: 88 %
Platelets: 202 10*3/uL (ref 150–400)
RBC: 4.23 MIL/uL (ref 3.87–5.11)
RDW: 11.9 % (ref 11.5–15.5)
WBC: 8 10*3/uL (ref 4.0–10.5)
nRBC: 0 % (ref 0.0–0.2)

## 2018-06-14 LAB — COMPREHENSIVE METABOLIC PANEL
ALK PHOS: 81 U/L (ref 38–126)
ALT: 10 U/L (ref 0–44)
ANION GAP: 7 (ref 5–15)
AST: 18 U/L (ref 15–41)
Albumin: 3.8 g/dL (ref 3.5–5.0)
BILIRUBIN TOTAL: 0.5 mg/dL (ref 0.3–1.2)
BUN: 18 mg/dL (ref 8–23)
CALCIUM: 8.5 mg/dL — AB (ref 8.9–10.3)
CO2: 24 mmol/L (ref 22–32)
Chloride: 95 mmol/L — ABNORMAL LOW (ref 98–111)
Creatinine, Ser: 0.87 mg/dL (ref 0.44–1.00)
GFR calc non Af Amer: 58 mL/min — ABNORMAL LOW (ref >60)
GLUCOSE: 131 mg/dL — AB (ref 70–99)
Potassium: 4.8 mmol/L (ref 3.5–5.1)
Sodium: 126 mmol/L — ABNORMAL LOW (ref 135–145)
Total Protein: 6.6 g/dL (ref 6.5–8.1)

## 2018-06-14 LAB — INFLUENZA PANEL BY PCR (TYPE A & B)
INFLAPCR: NEGATIVE
INFLBPCR: NEGATIVE

## 2018-06-14 LAB — LACTIC ACID, PLASMA: Lactic Acid, Venous: 1.4 mmol/L (ref 0.5–1.9)

## 2018-06-14 MED ORDER — IOHEXOL 300 MG/ML  SOLN
75.0000 mL | Freq: Once | INTRAMUSCULAR | Status: AC | PRN
  Administered 2018-06-14: 75 mL via INTRAVENOUS

## 2018-06-14 MED ORDER — LORAZEPAM 1 MG PO TABS
1.0000 mg | ORAL_TABLET | Freq: Two times a day (BID) | ORAL | Status: DC
  Administered 2018-06-14: 1 mg via ORAL
  Filled 2018-06-14 (×2): qty 1

## 2018-06-14 MED ORDER — METOPROLOL SUCCINATE ER 50 MG PO TB24
50.0000 mg | ORAL_TABLET | Freq: Every day | ORAL | Status: DC
  Administered 2018-06-15: 50 mg via ORAL
  Filled 2018-06-14: qty 1

## 2018-06-14 MED ORDER — HYDROXYZINE HCL 25 MG PO TABS: 25.0000 mg | ORAL_TABLET | Freq: Three times a day (TID) | ORAL | Status: DC | PRN

## 2018-06-14 MED ORDER — MELATONIN 5 MG PO TABS
5.0000 mg | ORAL_TABLET | Freq: Every evening | ORAL | Status: DC | PRN
  Filled 2018-06-14: qty 1

## 2018-06-14 MED ORDER — ACETAMINOPHEN 325 MG PO TABS
650.0000 mg | ORAL_TABLET | Freq: Four times a day (QID) | ORAL | Status: DC | PRN
  Administered 2018-06-14 – 2018-06-15 (×2): 650 mg via ORAL
  Filled 2018-06-14 (×2): qty 2

## 2018-06-14 MED ORDER — DILTIAZEM HCL 25 MG/5ML IV SOLN
10.0000 mg | Freq: Once | INTRAVENOUS | Status: AC
  Administered 2018-06-14: 10 mg via INTRAVENOUS
  Filled 2018-06-14: qty 5

## 2018-06-14 MED ORDER — ACETAMINOPHEN 650 MG RE SUPP: 650.0000 mg | Freq: Four times a day (QID) | RECTAL | Status: DC | PRN

## 2018-06-14 MED ORDER — DILTIAZEM HCL ER COATED BEADS 180 MG PO CP24
180.0000 mg | ORAL_CAPSULE | Freq: Once | ORAL | Status: AC
  Administered 2018-06-14: 180 mg via ORAL
  Filled 2018-06-14 (×2): qty 1

## 2018-06-14 MED ORDER — MYCOPHENOLATE MOFETIL 250 MG PO CAPS
500.0000 mg | ORAL_CAPSULE | Freq: Every day | ORAL | Status: DC
  Administered 2018-06-15: 500 mg via ORAL
  Filled 2018-06-14: qty 2

## 2018-06-14 MED ORDER — SODIUM CHLORIDE 0.9 % IV SOLN
INTRAVENOUS | Status: DC
  Administered 2018-06-14: 23:00:00 via INTRAVENOUS

## 2018-06-14 MED ORDER — PREDNISONE 10 MG PO TABS
5.0000 mg | ORAL_TABLET | Freq: Every day | ORAL | Status: DC
  Administered 2018-06-15: 5 mg via ORAL
  Filled 2018-06-14 (×2): qty 1

## 2018-06-14 MED ORDER — SODIUM CHLORIDE 0.9 % IV BOLUS: 1000.0000 mL | Freq: Once | INTRAVENOUS | Status: DC

## 2018-06-14 MED ORDER — SODIUM CHLORIDE 0.9 % IV BOLUS
500.0000 mL | Freq: Once | INTRAVENOUS | Status: AC
  Administered 2018-06-14: 500 mL via INTRAVENOUS

## 2018-06-14 MED ORDER — ONDANSETRON HCL 4 MG/2ML IJ SOLN: 4.0000 mg | Freq: Four times a day (QID) | INTRAMUSCULAR | Status: DC | PRN

## 2018-06-14 MED ORDER — ONDANSETRON HCL 4 MG PO TABS: 4.0000 mg | ORAL_TABLET | Freq: Four times a day (QID) | ORAL | Status: DC | PRN

## 2018-06-14 MED ORDER — BISACODYL 5 MG PO TBEC
5.0000 mg | DELAYED_RELEASE_TABLET | Freq: Two times a day (BID) | ORAL | Status: DC
  Administered 2018-06-14: 5 mg via ORAL
  Filled 2018-06-14 (×2): qty 1

## 2018-06-14 NOTE — ED Triage Notes (Addendum)
Pt arrived via EMS from home with c/o nausea and being lethargic. EMS reports pt was seen at PCP a few days ago for CP, fatigue, and altered mental status. Pt appears lethargic at this time nd c/o not feeling well. Pt has hx of stroke with right sided weakness and is currently taking azithromycin for pneumonia. Pt denies any diarrhea or episodes of emesis.

## 2018-06-14 NOTE — ED Notes (Signed)
Pt spouse called out because he wanted BP taken - advised that BP was set to take every hour - he requested it be taken more often - BP reading set to 30 mintues

## 2018-06-14 NOTE — ED Notes (Signed)
First call report att; 6 min on hold, call back

## 2018-06-14 NOTE — ED Notes (Signed)
Pts BP was 89/60 after giving cardiazem, dr. Alfred Levins notified. Verbal order given for 500 ml bolus of NaCl over 30 minutes

## 2018-06-14 NOTE — ED Notes (Signed)
Pt and husband given meal tray, apple sauce and water as requested

## 2018-06-14 NOTE — ED Notes (Signed)
Report finished att 

## 2018-06-14 NOTE — H&P (Addendum)
[facility] [place] NAME: Brenda Roman    MR#:  [id number]   DATE OF BIRTH:  [birth year]  DATE OF ADMISSION:  06/14/2018  PRIMARY CARE PHYSICIAN: Tracie Harrier, MD   REQUESTING/REFERRING PHYSICIAN: Dr. Rudene Re.  CHIEF COMPLAINT:   Chief Complaint  Patient presents with  . Nausea    HISTORY OF PRESENT ILLNESS:  Brenda Roman  is a 85 y.o. 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  Patient is very hard of hearing therefore most of the history obtained from the husband at bedside.  As per the patient's husband she has been more more lethargic and complaining of some nausea over the past few days.  He brought her to the ER today as he was attempting to get her from her bed to a wheelchair and her legs gave way and she fell to the ground.  Patient was seen by her primary care physician a few days ago and thought to have suspected pneumonia/bronchitis and therefore started on Zithromax but despite being on that she continues to have progressive symptoms as mentioned.  She was brought to the ER for further evaluation and her work-up was essentially benign with a negative chest x-ray and CT chest for pneumonia, negative urinalysis but she was noted to have some atrial fibrillation with rapid ventricular response.  Patient received some IV fluids but despite that continued to have heart rates there are labile, she received some IV Cardizem bolus and then became hypotensive.  She was noted to be mildly hyponatremic.  Hospitalist services therefore were contacted for admission.  PAST MEDICAL HISTORY:   Past Medical History:  Diagnosis Date  . Aortic stenosis   . 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 Cancer ([place])    skin  . CHF (congestive heart  failure) ([place])   . Coronary artery disease   . CVA (cerebral infarction) June '16  . Hypertension   . Liver transplanted ([place])   . Macular degeneration   . Mitral valve disease   . PBC (primary biliary cirrhosis)    s/p liver transplant  . Shingles   . TIA (transient ischemic attack)     PAST SURGICAL HISTORY:   Past Surgical History:  Procedure Laterality Date  . BOWEL RESECTION    . COLONOSCOPY    . CORONARY ARTERY BYPASS GRAFT    . JOINT REPLACEMENT    . LIVER TRANSPLANT  1990    SOCIAL HISTORY:   Social History   Tobacco Use  . Smoking status: Former Research scientist (life sciences)  . Smokeless tobacco: Never Used  Substance Use Topics  . Alcohol use: No    FAMILY HISTORY:   Family History  Problem Relation Age of Onset  . Hypertension Father   . Heart attack Father   . Stroke Mother     DRUG ALLERGIES:   Allergies  Allergen Reactions  . Cephalexin Other (See Comments)    Reaction:  Unknown  . Trazodone     Other reaction(s): Other (See Comments) Bad Dreams   . Zaleplon     Other reaction(s): Other (See Comments) Weird Dreams  . Zolpidem     Other reaction(s): Other (See Comments) Memory, worried her to take it  . Novocain [Procaine] Rash  . Sulfa Antibiotics Rash  REVIEW OF SYSTEMS:   Review of Systems  Constitutional: Negative for fever and weight loss.  HENT: Negative for congestion, nosebleeds and tinnitus.   Eyes: Negative for blurred vision, double vision and redness.  Respiratory: Negative for cough, hemoptysis and shortness of breath.   Cardiovascular: Negative for chest pain, orthopnea, leg swelling and PND.  Gastrointestinal: Negative for abdominal pain, diarrhea, melena, nausea and vomiting.  Genitourinary: Negative for dysuria, hematuria and urgency.  Musculoskeletal: Negative for falls and joint pain.  Neurological: Positive for weakness (Generalized). Negative for dizziness, tingling, sensory change, focal weakness, seizures and headaches.    Endo/Heme/Allergies: Negative for polydipsia. Does not bruise/bleed easily.  Psychiatric/Behavioral: Negative for depression and memory loss. The patient is not nervous/anxious.     MEDICATIONS AT HOME:   Prior to Admission medications   Medication Sig Start Date End Date Taking? Authorizing Provider  bisacodyl (DULCOLAX) 5 MG EC tablet Take 5 mg by mouth 2 (two) times daily.   Yes [provider]  hydrOXYzine (ATARAX/VISTARIL) 25 MG tablet Take 25 mg by mouth 3 (three) times daily as needed for itching.  02/19/18  Yes [provider]  LORazepam (ATIVAN) 2 MG tablet Take 1 mg by mouth 2 (two) times daily. Early evening and at about 2300   Yes [provider]  Melatonin 1 MG TABS Take 1 mg by mouth at bedtime as needed (sleep).   Yes [provider]  metoprolol succinate (TOPROL-XL) 50 MG 24 hr tablet Take 50 mg by mouth daily. Take with or immediately following a meal.    Yes [provider]  mycophenolate (CELLCEPT) 500 MG tablet Take 500 mg by mouth daily.    Yes [provider]  nitroGLYCERIN (NITROSTAT) 0.4 MG SL tablet Place 1 tablet under the tongue as needed. Reported on 10/26/2015 06/20/15  Yes [provider]  predniSONE (DELTASONE) 10 MG tablet Take 10 mg by mouth See admin instructions. Take 3 tablets (30mg ) by mouth daily for two days, 2 tablets (20mg ) by mouth daily for two days then take 1 tablet (10mg ) by mouth daily for two days 06/12/18 06/18/18 Yes [provider]  predniSONE (DELTASONE) 5 MG tablet Take 5 mg by mouth daily.   Yes [provider]      VITAL SIGNS:  Blood pressure (!) 112/97, pulse (!) 37, temperature (!) 97.3 F (36.3 C), temperature source Oral, resp. rate 17, height 5\' 1"  (1.549 m), weight 45.4 kg, SpO2 95 %.  PHYSICAL EXAMINATION:  Physical Exam  GENERAL:  85 y.o.-year-old patient lying in the bed in no acute distress but very hard of hearing.  EYES: Blind in both eyes. No  scleral icterus. Extraocular muscles intact.  HEENT: Head atraumatic, normocephalic. Oropharynx and nasopharynx clear. No oropharyngeal erythema, moist oral mucosa  NECK:  Supple, no jugular venous distention. No thyroid enlargement, no tenderness.  LUNGS: Normal breath sounds bilaterally, no wheezing, rales, rhonchi. No use of accessory muscles of respiration.  CARDIOVASCULAR: S1, S2 RRR. No murmurs, rubs, gallops, clicks.  ABDOMEN: Soft, nontender, nondistended. Bowel sounds present. No organomegaly or mass.  EXTREMITIES: No pedal edema, cyanosis, or clubbing. + 2 pedal & radial pulses b/l.   NEUROLOGIC: Cranial nerves II through XII are intact. No focal Motor or sensory deficits appreciated b/l.  Globally weak PSYCHIATRIC: The patient is alert and oriented x 3. SKIN: No obvious rash, lesion, or ulcer.   LABORATORY PANEL:   CBC Recent Labs  Lab 06/14/18 1453  WBC 8.0  HGB 12.7  HCT 38.9  PLT 202   ------------------------------------------------------------------------------------------------------------------  Chemistries  Recent Labs  Lab 06/14/18 1453  NA 126*  K 4.8  CL 95*  CO2 24  GLUCOSE 131*  BUN 18  CREATININE 0.87  CALCIUM 8.5*  AST 18  ALT 10  ALKPHOS 81  BILITOT 0.5   ------------------------------------------------------------------------------------------------------------------  Cardiac Enzymes No results for input(s): TROPONINI in the last 168 hours. ------------------------------------------------------------------------------------------------------------------  RADIOLOGY:  Dg Chest 2 View  Result Date: 06/14/2018 CLINICAL DATA:  Pain all over. Possible pneumonia. EXAM: CHEST - 2 VIEW COMPARISON:  05/23/2018 FINDINGS: Enlarged heart. Calcific atherosclerotic disease and tortuosity of the aorta. Stable postsurgical changes. Mild pulmonary vascular congestion Right pleural effusion. Stable compression deformities of the lower thoracic vertebral  bodies. IMPRESSION: 1. Cardiomegaly with mild pulmonary vascular congestion. 2. Right pleural effusion. Electronically Signed   By: Fidela Salisbury M.D.   On: 06/14/2018 16:35   Ct Chest W Contrast  Result Date: 06/14/2018 CLINICAL DATA:  Altered mental status.  Lethargy. EXAM: CT CHEST WITH CONTRAST TECHNIQUE: Multidetector CT imaging of the chest was performed during intravenous contrast administration. CONTRAST:  71mL OMNIPAQUE IOHEXOL 300 MG/ML  SOLN COMPARISON:  Chest x-ray from same day. CT chest dated January 30, 2017. FINDINGS: Cardiovascular: Stable cardiomegaly with prominent left atrial enlargement. No thoracic aortic aneurysm or dissection. Coronary, aortic arch, and branch vessel atherosclerotic vascular disease. No pulmonary embolism. Stable moderate dilatation of the left and right pulmonary arteries. Mediastinum/Nodes: No enlarged mediastinal, hilar, or axillary lymph nodes. Thyroid gland, trachea, and esophagus demonstrate no significant findings. Lungs/Pleura: Small right pleural effusion. Small amount of loculated pleural fluid along the superior right major fissure. Associated subsegmental atelectasis in the right lower lobe. Unchanged scarring at the lung bases. No consolidation or pneumothorax. Interval mild enlargement of a pleural based nodule along the superior left major fissure, now measuring 6 x 10 mm, previously 4 x 7 mm. Upper Abdomen: No acute abnormality. Unchanged pneumobilia and left renal cyst. Musculoskeletal: No chest wall abnormality. No acute or significant osseous findings. Unchanged chronic thoracic compression deformities. Unchanged healed bilateral posterior rib fractures. IMPRESSION: 1. Small right pleural effusion with adjacent right lower lobe atelectasis. No evidence of pneumonia. 2. Slight interval increase in size of a pleural-based nodule along the superior left major fissure, now measuring 8 mm in aggregate size, previously 6 mm. Non-contrast chest CT at  6-12 months is recommended. If the nodule is stable at time of repeat CT, then future CT at 18-24 months (from today's scan) is considered optional for low-risk patients, but is recommended for high-risk patients. This recommendation follows the consensus statement: Guidelines for Management of Incidental Pulmonary Nodules Detected on CT Images: From the Fleischner Society 2017; Radiology 2017; 284:228-243. 3. Stable pulmonary artery enlargement, suggestive of pulmonary arterial hypertension. 4.  Aortic atherosclerosis (ICD10-I70.0). Electronically Signed   By: Titus Dubin M.D.   On: 06/14/2018 18:03     IMPRESSION AND PLAN:   Brenda Roman  is a 85 y.o. 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  1.  Generalized weakness- etiology unclear presently.  Possibly to mild dehydration with deconditioning.  Patient is normally bedbound but is able to get from bed to wheelchair as per the husband she has not been able to do that now. -We will gently hydrate her with IV fluids, will get  physical therapy consult.  2.  Atrial fibrillation with rapid ventricular response-patient had some labile heart rates in the ER.  She was given some IV fluids but heart rates remained elevated.  She received some IV Cardizem and became hypotensive then. - I will continue her Toprol all, observe on telemetry, will consult cardiology help with rate management.  3.  History of primary biliary cirrhosis-status post liver transplant. -Continue prednisone, CellCept. -Continue Atarax as needed  4.  Anxiety-continue Ativan as needed.  5.  Hyponatremia-mild and possibly due to dehydration. - We will gently hydrate the patient with IV fluids, follow sodium.  Patient is not on any diuretics.   All the records are reviewed and  case discussed with ED provider. Management plans discussed with the patient, family and they are in agreement.  CODE STATUS: DNR  TOTAL TIME TAKING CARE OF THIS PATIENT: 40 minutes.    Henreitta Leber M.D on 06/14/2018 at 7:13 PM  Between 7am to 6pm - Pager - [phone]  After 6pm go to [email or web address] - password [email or web address] [facility]  Office  [phone]  CC: Primary care physician; Tracie Harrier, MD

## 2018-06-14 NOTE — ED Notes (Addendum)
ED TO INPATIENT HANDOFF REPORT  ED Nurse Name and Phone #: Ena Dawley [phone]  S Name/Age/Gender Brenda Roman 85 y.o. female Room/Bed: ED13A/ED13A  Code Status   Code Status: Prior  Home/SNF/Other Home Patient oriented to: self, place and situation Is this baseline? Yes   Triage Complete: Triage complete  Chief Complaint Generalized Weakness   Triage Note Pt arrived via EMS from home with c/o nausea and being lethargic. EMS reports pt was seen at PCP a few days ago for CP, fatigue, and altered mental status. Pt appears lethargic at this time nd c/o not feeling well. Pt has hx of stroke with right sided weakness and is currently taking azithromycin for pneumonia. Pt denies any diarrhea or episodes of emesis.     Allergies Allergies  Allergen Reactions  . Cephalexin Other (See Comments)    Reaction:  Unknown  . Trazodone     Other reaction(s): Other (See Comments) Bad Dreams   . Zaleplon     Other reaction(s): Other (See Comments) Weird Dreams  . Zolpidem     Other reaction(s): Other (See Comments) Memory, worried her to take it  . Novocain [Procaine] Rash  . Sulfa Antibiotics Rash    Level of Care/Admitting Diagnosis ED Disposition    ED Disposition Condition [facility]: [place] [[id number]]  Level of Care: Telemetry [5]  Diagnosis: Atrial fibrillation with RVR [facility]) [[id number]]  Admitting Physician: Henreitta Leber [[id number]]  Attending Physician: Henreitta Leber [[id number]]  PT Class (Do Not Modify): Observation [104]  PT Acc Code (Do Not Modify): Observation [10022]       B Medical/Surgery History Past Medical History:  Diagnosis Date  . Aortic stenosis   . 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 Cancer ([place])    skin  . CHF (congestive heart failure) ([place])   . Coronary artery disease   . CVA (cerebral infarction) June '16  . Hypertension   . Liver transplanted ([place])   . Macular degeneration   . Mitral valve disease   . PBC  (primary biliary cirrhosis)    s/p liver transplant  . Shingles   . TIA (transient ischemic attack)    Past Surgical History:  Procedure Laterality Date  . BOWEL RESECTION    . COLONOSCOPY    . CORONARY ARTERY BYPASS GRAFT    . JOINT REPLACEMENT    . LIVER TRANSPLANT  1990     A IV Location/Drains/Wounds Patient Lines/Drains/Airways Status   Active Line/Drains/Airways    Name:   Placement date:   Placement time:   Site:   Days:   Peripheral IV 06/14/18 Left Antecubital   06/14/18    1445    Antecubital   less than 1          Intake/Output Last 24 hours  Intake/Output Summary (Last 24 hours) at 06/14/2018 1958 Last data filed at 06/14/2018 1842 Gross per 24 hour  Intake 1000 ml  Output -  Net 1000 ml    Labs/Imaging Results for orders placed or performed during the hospital encounter of 06/14/18 (from the past 48 hour(s))  CBC with Differential/Platelet     Status: None   Collection Time: 06/14/18  2:53 PM  Result Value Ref Range   WBC 8.0 4.0 - 10.5 K/uL   RBC 4.23 3.87 - 5.11 MIL/uL   Hemoglobin 12.7 12.0 - 15.0 g/dL   HCT 38.9 36.0 - 46.0 %   MCV 92.0 80.0 - 100.0  fL   MCH 30.0 26.0 - 34.0 pg   MCHC 32.6 30.0 - 36.0 g/dL   RDW 11.9 11.5 - 15.5 %   Platelets 202 150 - 400 K/uL   nRBC 0.0 0.0 - 0.2 %   Neutrophils Relative % 88 %   Neutro Abs 7.0 1.7 - 7.7 K/uL   Lymphocytes Relative 9 %   Lymphs Abs 0.7 0.7 - 4.0 K/uL   Monocytes Relative 2 %   Monocytes Absolute 0.2 0.1 - 1.0 K/uL   Eosinophils Relative 0 %   Eosinophils Absolute 0.0 0.0 - 0.5 K/uL   Basophils Relative 0 %   Basophils Absolute 0.0 0.0 - 0.1 K/uL   Immature Granulocytes 1 %   Abs Immature Granulocytes 0.06 0.00 - 0.07 K/uL    Comment: Performed at [facility], [address] [place], [place] [address]  Comprehensive metabolic panel     Status: Abnormal   Collection Time: 06/14/18  2:53 PM  Result Value Ref Range   Sodium 126 (L) 135 - 145 mmol/L   Potassium 4.8 3.5 - 5.1  mmol/L   Chloride 95 (L) 98 - 111 mmol/L   CO2 24 22 - 32 mmol/L   Glucose, Bld 131 (H) 70 - 99 mg/dL   BUN 18 8 - 23 mg/dL   Creatinine, Ser 0.87 0.44 - 1.00 mg/dL   Calcium 8.5 (L) 8.9 - 10.3 mg/dL   Total Protein 6.6 6.5 - 8.1 g/dL   Albumin 3.8 3.5 - 5.0 g/dL   AST 18 15 - 41 U/L   ALT 10 0 - 44 U/L   Alkaline Phosphatase 81 38 - 126 U/L   Total Bilirubin 0.5 0.3 - 1.2 mg/dL   GFR calc non Af Amer 58 (L) >60 mL/min   GFR calc Af Amer >60 >60 mL/min   Anion gap 7 5 - 15    Comment: Performed at [facility], [address] [place], [place] [address]  Lactic acid, plasma     Status: None   Collection Time: 06/14/18  2:53 PM  Result Value Ref Range   Lactic Acid, Venous 1.4 0.5 - 1.9 mmol/L    Comment: Performed at [facility], [address] [place], [place] [address]  Influenza panel by PCR (type A & B)     Status: None   Collection Time: 06/14/18  2:54 PM  Result Value Ref Range   Influenza A By PCR NEGATIVE NEGATIVE   Influenza B By PCR NEGATIVE NEGATIVE    Comment: (NOTE) The Xpert Xpress Flu assay is intended as an aid in the diagnosis of  influenza and should not be used as a sole basis for treatment.  This  assay is FDA approved for nasopharyngeal swab specimens only. Nasal  washings and aspirates are unacceptable for Xpert Xpress Flu testing. Performed at [facility], [address] [place], [place] [address]   Urinalysis, Complete w Microscopic     Status: Abnormal   Collection Time: 06/14/18  6:31 PM  Result Value Ref Range   Color, Urine YELLOW (A) YELLOW   APPearance CLEAR (A) CLEAR   Specific Gravity, Urine 1.021 1.005 - 1.030   pH 5.0 5.0 - 8.0   Glucose, UA NEGATIVE NEGATIVE mg/dL   Hgb urine dipstick NEGATIVE NEGATIVE   Bilirubin Urine NEGATIVE NEGATIVE   Ketones, ur NEGATIVE NEGATIVE mg/dL   Protein, ur NEGATIVE NEGATIVE mg/dL   Nitrite NEGATIVE NEGATIVE   Leukocytes,Ua SMALL (A) NEGATIVE  RBC / HPF 0-5 0 - 5  RBC/hpf   WBC, UA 11-20 0 - 5 WBC/hpf   Bacteria, UA NONE SEEN NONE SEEN   Squamous Epithelial / LPF 0-5 0 - 5    Comment: Performed at [facility], [address], [place], [place] [address]   Dg Chest 2 View  Result Date: 06/14/2018 CLINICAL DATA:  Pain all over. Possible pneumonia. EXAM: CHEST - 2 VIEW COMPARISON:  05/23/2018 FINDINGS: Enlarged heart. Calcific atherosclerotic disease and tortuosity of the aorta. Stable postsurgical changes. Mild pulmonary vascular congestion Right pleural effusion. Stable compression deformities of the lower thoracic vertebral bodies. IMPRESSION: 1. Cardiomegaly with mild pulmonary vascular congestion. 2. Right pleural effusion. Electronically Signed   By: Fidela Salisbury M.D.   On: 06/14/2018 16:35   Ct Chest W Contrast  Result Date: 06/14/2018 CLINICAL DATA:  Altered mental status.  Lethargy. EXAM: CT CHEST WITH CONTRAST TECHNIQUE: Multidetector CT imaging of the chest was performed during intravenous contrast administration. CONTRAST:  42mL OMNIPAQUE IOHEXOL 300 MG/ML  SOLN COMPARISON:  Chest x-ray from same day. CT chest dated January 30, 2017. FINDINGS: Cardiovascular: Stable cardiomegaly with prominent left atrial enlargement. No thoracic aortic aneurysm or dissection. Coronary, aortic arch, and branch vessel atherosclerotic vascular disease. No pulmonary embolism. Stable moderate dilatation of the left and right pulmonary arteries. Mediastinum/Nodes: No enlarged mediastinal, hilar, or axillary lymph nodes. Thyroid gland, trachea, and esophagus demonstrate no significant findings. Lungs/Pleura: Small right pleural effusion. Small amount of loculated pleural fluid along the superior right major fissure. Associated subsegmental atelectasis in the right lower lobe. Unchanged scarring at the lung bases. No consolidation or pneumothorax. Interval mild enlargement of a pleural based nodule along the superior left major fissure, now measuring 6 x 10 mm,  previously 4 x 7 mm. Upper Abdomen: No acute abnormality. Unchanged pneumobilia and left renal cyst. Musculoskeletal: No chest wall abnormality. No acute or significant osseous findings. Unchanged chronic thoracic compression deformities. Unchanged healed bilateral posterior rib fractures. IMPRESSION: 1. Small right pleural effusion with adjacent right lower lobe atelectasis. No evidence of pneumonia. 2. Slight interval increase in size of a pleural-based nodule along the superior left major fissure, now measuring 8 mm in aggregate size, previously 6 mm. Non-contrast chest CT at 6-12 months is recommended. If the nodule is stable at time of repeat CT, then future CT at 18-24 months (from today's scan) is considered optional for low-risk patients, but is recommended for high-risk patients. This recommendation follows the consensus statement: Guidelines for Management of Incidental Pulmonary Nodules Detected on CT Images: From the Fleischner Society 2017; Radiology 2017; 284:228-243. 3. Stable pulmonary artery enlargement, suggestive of pulmonary arterial hypertension. 4.  Aortic atherosclerosis (ICD10-I70.0). Electronically Signed   By: Titus Dubin M.D.   On: 06/14/2018 18:03    Pending Labs [employer] (From admission, onward)    Start     Ordered   Signed and Occupational hygienist morning,   R     Signed and Held   Signed and Held  CBC  Tomorrow morning,   R     Signed and Held          Vitals/Pain Today's Vitals   06/14/18 1830 06/14/18 1900 06/14/18 1930 06/14/18 1947  BP: (!) 112/97 (!) 117/103 (!) 124/96   Pulse: (!) 37 (!) 111 76   Resp: 17 17 17    Temp:      TempSrc:      SpO2: 95% 94% 95%  Weight:      Height:      PainSc:    0-No pain    Isolation Precautions Droplet precaution  Medications Medications  sodium chloride 0.9 % bolus 500 mL (0 mLs Intravenous Stopped 06/14/18 1605)  diltiazem (CARDIZEM) injection 10 mg (10 mg Intravenous Given 06/14/18  1702)  diltiazem (CARDIZEM CD) 24 hr capsule 180 mg (180 mg Oral Given 06/14/18 1830)  sodium chloride 0.9 % bolus 500 mL (0 mLs Intravenous Stopped 06/14/18 1842)  iohexol (OMNIPAQUE) 300 MG/ML solution 75 mL (75 mLs Intravenous Contrast Given 06/14/18 1731)    Mobility Only with person assist or wheelchair High fall risk   Focused Assessments Cardiac Assessment Handoff:    Lab Results  Component Value Date   CKTOTAL 71 12/26/2012   CKMB 2.2 12/26/2012   TROPONINI 0.05 ([place]) 05/23/2018   No results found for: DDIMER Does the Patient currently have chest pain? No     R Recommendations: See Admitting Provider Note  Report given to:   Additional Notes: blind and nearly deaf, husband at bedside  Husband reports that pt fell last time she was admitted to hospital

## 2018-06-14 NOTE — ED Notes (Addendum)
Pt requested to void so assisted to use bedpan and urine sample obtained  Admitting provider in room and demanded that monitoring equipment be turned off because of the alarming - vital signs lost d/t this encounter

## 2018-06-14 NOTE — ED Provider Notes (Signed)
[facility] Emergency Department Provider Note  ____________________________________________  Time seen: Approximately 3:24 PM  I have reviewed the triage vital signs and the nursing notes.   HISTORY  Chief Complaint Nausea  HPI Brenda Roman is a 85 y.o. female with a history of paroxysmal atrial fibrillation on Xarelto, hypertension, CAD, CVA with R sided hemiparesis, liver transplant, blindness, aortic stenosis who presents for evaluation of generalized weakness.  According to the husband patient was diagnosed with pneumonia 2 days ago by her primary care doctor and she was started on azithromycin and prednisone boost.  For the last 2 days she has become progressively weaker, decreased appetite.  She has had no fever, vomiting, diarrhea, shortness of breath or chest pain.  Patient has a mild cough. No abdominal pain or dysuria.   Past Medical History:  Diagnosis Date  . Aortic stenosis   . 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 Cancer ([place])    skin  . CHF (congestive heart failure) ([place])   . Coronary artery disease   . CVA (cerebral infarction) June '16  . Hypertension   . Liver transplanted ([place])   . Macular degeneration   . Mitral valve disease   . PBC (primary biliary cirrhosis)    s/p liver transplant  . Shingles   . TIA (transient ischemic attack)     Patient Active Problem List   Diagnosis Date Noted  . Atrial fibrillation with RVR ([place]) 06/14/2018  . Wheelchair bound 02/05/2018  . Primary insomnia 12/04/2017  . Atrial fibrillation ([place]) 06/06/2017  . Chest pain 06/05/2017  . Dizziness 04/23/2017  . Diarrhea 04/23/2017  . UTI (urinary tract infection) 04/23/2017  . Acute diverticulitis 11/23/2016  . Arrhythmia, ventricular 09/19/2015  . Neuropathy 09/06/2015  . Hyponatremia 07/30/2015  . Hypo-osmolality and hyponatremia 07/30/2015  . Acute CHF (congestive heart failure) ([place]) 07/18/2015  . Mitral valve stenosis, moderate 06/20/2015  .  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 Closed wedge fracture of lumbar vertebra ([place]) 10/11/2014  . Ischemic stroke ([place]) 10/07/2014  . Acute lower UTI 10/07/2014  . 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 History of stroke 10/07/2014  . Recurrent UTI 10/07/2014  . Closed fracture of humerus, surgical neck 09/29/2014  . Goals of care, counseling/discussion   . Acute encephalopathy 08/29/2014  . Hypoxia 08/29/2014  . Healthcare-associated pneumonia 08/29/2014  . Pneumonia 08/29/2014  . CVA (cerebral infarction) 08/23/2014  . Arteriosclerosis of coronary artery 03/15/2014  . Paroxysmal atrial fibrillation ([place]) 03/15/2014  . Atherosclerosis of abdominal aorta ([place]) 01/27/2014  . HLD (hyperlipidemia) 04/07/2012  . BP (high blood pressure) 04/07/2012  . History of liver transplant ([place]) 04/03/2012  . Encounter for other procedures for purposes other than remedying health state 04/03/2012  . Immunosuppression ([place]) 04/03/2012  . Prolapse of urethra 03/10/2012  . Pelvic relaxation due to uterovaginal prolapse 03/10/2012    Past Surgical History:  Procedure Laterality Date  . BOWEL RESECTION    . COLONOSCOPY    . CORONARY ARTERY BYPASS GRAFT    . JOINT REPLACEMENT    . LIVER TRANSPLANT  1990    Prior to Admission medications   Medication Sig Start Date End Date Taking? Authorizing Provider  bisacodyl (DULCOLAX) 5 MG EC tablet Take 5 mg by mouth 2 (two) times daily.   Yes [provider]  hydrOXYzine (ATARAX/VISTARIL) 25 MG tablet Take 25 mg by mouth 3 (three) times daily as needed for itching.  02/19/18  Yes [provider]  LORazepam (ATIVAN) 2 MG tablet Take 1 mg by mouth 2 (two) times daily. Early evening and at about 2300   Yes [provider]  Melatonin 1 MG TABS Take 1 mg by mouth at bedtime as needed (sleep).   Yes [provider]  metoprolol succinate (TOPROL-XL) 50 MG 24 hr tablet Take 50 mg by mouth daily. Take with or immediately  following a meal.    Yes [provider]  mycophenolate (CELLCEPT) 500 MG tablet Take 500 mg by mouth daily.    Yes [provider]  nitroGLYCERIN (NITROSTAT) 0.4 MG SL tablet Place 1 tablet under the tongue as needed. Reported on 10/26/2015 06/20/15  Yes [provider]  predniSONE (DELTASONE) 10 MG tablet Take 10 mg by mouth See admin instructions. Take 3 tablets (30mg ) by mouth daily for two days, 2 tablets (20mg ) by mouth daily for two days then take 1 tablet (10mg ) by mouth daily for two days 06/12/18 06/18/18 Yes [provider]  predniSONE (DELTASONE) 5 MG tablet Take 5 mg by mouth daily.   Yes [provider]    Allergies Cephalexin; Trazodone; Zaleplon; Zolpidem; Novocain [procaine]; and Sulfa antibiotics  Family History  Problem Relation Age of Onset  . Hypertension Father   . Heart attack Father   . Stroke Mother     Social History Social History   Tobacco Use  . Smoking status: Former Research scientist (life sciences)  . Smokeless tobacco: Never Used  Substance Use Topics  . Alcohol use: No  . Drug use: No    Review of Systems  Constitutional: Negative for fever. + generalized weakness Eyes: Negative for visual changes. ENT: Negative for sore throat. Neck: No neck pain  Cardiovascular: Negative for chest pain. Respiratory: Negative for shortness of breath. + cough Gastrointestinal: Negative for abdominal pain, vomiting or diarrhea. Genitourinary: Negative for dysuria. Musculoskeletal: Negative for back pain. Skin: Negative for rash. Neurological: Negative for headaches, weakness or numbness. Psych: No SI or HI  ____________________________________________   PHYSICAL EXAM:  VITAL SIGNS: ED Triage Vitals [06/14/18 1441]  Enc Vitals Group     BP 100/72     Pulse Rate (!) 127     Resp 19     Temp 97.6 F (36.4 C)     Temp Source Oral     SpO2 97 %     Weight 100 lb (45.4 kg)     Height 5\' 1"  (1.549 m)     Head Circumference      Peak  Flow      Pain Score 0     Pain Loc      Pain Edu?      Excl. in [place]?     Constitutional: Alert and oriented. Well appearing and in no apparent distress. HEENT:      Head: Normocephalic and atraumatic.         Eyes: Conjunctivae are normal. Sclera is non-icteric.       Mouth/Throat: Mucous membranes are moist.       Neck: Supple with no signs of meningismus. Cardiovascular: Irregularly irregular rhythm with tachycardic rate. No murmurs, gallops, or rubs. 2+ symmetrical distal pulses are present in all extremities. No JVD. Respiratory: Normal respiratory effort. Bilateral crackles, no wheezing and good air movement. Gastrointestinal: Soft, non tender, and non distended with positive bowel sounds. No rebound or guarding. Musculoskeletal: Nontender with normal range of motion in all extremities. No edema, cyanosis, or erythema of extremities. Neurologic: Normal speech and language. Face is symmetric. Moving  all extremities. No gross focal neurologic deficits are appreciated. Skin: Skin is warm, dry and intact. No rash noted. Psychiatric: Mood and affect are normal. Speech and behavior are normal.  ____________________________________________   LABS (all labs ordered are listed, but only abnormal results are displayed)  Labs Reviewed  COMPREHENSIVE METABOLIC PANEL - Abnormal; Notable for the following components:      Result Value   Sodium 126 (*)    Chloride 95 (*)    Glucose, Bld 131 (*)    Calcium 8.5 (*)    GFR calc non Af Amer 58 (*)    All other components within normal limits  URINALYSIS, COMPLETE (UACMP) WITH MICROSCOPIC - Abnormal; Notable for the following components:   Color, Urine YELLOW (*)    APPearance CLEAR (*)    Leukocytes,Ua SMALL (*)    All other components within normal limits  CBC WITH DIFFERENTIAL/PLATELET  LACTIC ACID, PLASMA  INFLUENZA PANEL BY PCR (TYPE A & B)  BASIC METABOLIC PANEL  CBC   ____________________________________________  EKG  ED ECG  REPORT I, Rudene Re, the attending physician, personally viewed and interpreted this ECG.  Atrial fibrillation, rate of 125, normal QTC, left axis deviation, left bundle branch block, no concordant ST elevation.  Unchanged from prior. ____________________________________________  RADIOLOGY  I have personally reviewed the images performed during this visit and I agree with the Radiologist's read.   Interpretation by Radiologist:  Dg Chest 2 View  Result Date: 06/14/2018 CLINICAL DATA:  Pain all over. Possible pneumonia. EXAM: CHEST - 2 VIEW COMPARISON:  05/23/2018 FINDINGS: Enlarged heart. Calcific atherosclerotic disease and tortuosity of the aorta. Stable postsurgical changes. Mild pulmonary vascular congestion Right pleural effusion. Stable compression deformities of the lower thoracic vertebral bodies. IMPRESSION: 1. Cardiomegaly with mild pulmonary vascular congestion. 2. Right pleural effusion. Electronically Signed   By: Fidela Salisbury M.D.   On: 06/14/2018 16:35   Ct Chest W Contrast  Result Date: 06/14/2018 CLINICAL DATA:  Altered mental status.  Lethargy. EXAM: CT CHEST WITH CONTRAST TECHNIQUE: Multidetector CT imaging of the chest was performed during intravenous contrast administration. CONTRAST:  26mL OMNIPAQUE IOHEXOL 300 MG/ML  SOLN COMPARISON:  Chest x-ray from same day. CT chest dated January 30, 2017. FINDINGS: Cardiovascular: Stable cardiomegaly with prominent left atrial enlargement. No thoracic aortic aneurysm or dissection. Coronary, aortic arch, and branch vessel atherosclerotic vascular disease. No pulmonary embolism. Stable moderate dilatation of the left and right pulmonary arteries. Mediastinum/Nodes: No enlarged mediastinal, hilar, or axillary lymph nodes. Thyroid gland, trachea, and esophagus demonstrate no significant findings. Lungs/Pleura: Small right pleural effusion. Small amount of loculated pleural fluid along the superior right major fissure.  Associated subsegmental atelectasis in the right lower lobe. Unchanged scarring at the lung bases. No consolidation or pneumothorax. Interval mild enlargement of a pleural based nodule along the superior left major fissure, now measuring 6 x 10 mm, previously 4 x 7 mm. Upper Abdomen: No acute abnormality. Unchanged pneumobilia and left renal cyst. Musculoskeletal: No chest wall abnormality. No acute or significant osseous findings. Unchanged chronic thoracic compression deformities. Unchanged healed bilateral posterior rib fractures. IMPRESSION: 1. Small right pleural effusion with adjacent right lower lobe atelectasis. No evidence of pneumonia. 2. Slight interval increase in size of a pleural-based nodule along the superior left major fissure, now measuring 8 mm in aggregate size, previously 6 mm. Non-contrast chest CT at 6-12 months is recommended. If the nodule is stable at time of repeat CT, then future CT at 18-24 months (from today's  scan) is considered optional for low-risk patients, but is recommended for high-risk patients. This recommendation follows the consensus statement: Guidelines for Management of Incidental Pulmonary Nodules Detected on CT Images: From the Fleischner Society 2017; Radiology 2017; 284:228-243. 3. Stable pulmonary artery enlargement, suggestive of pulmonary arterial hypertension. 4.  Aortic atherosclerosis (ICD10-I70.0). Electronically Signed   By: Titus Dubin M.D.   On: 06/14/2018 18:03     ____________________________________________   PROCEDURES  Procedure(s) performed: None Procedures Critical Care performed:  Yes  CRITICAL CARE Performed by: Rudene Re  ?  Total critical care time: 30 min  Critical care time was exclusive of separately billable procedures and treating other patients.  Critical care was necessary to treat or prevent imminent or life-threatening deterioration.  Critical care was time spent personally by me on the following  activities: development of treatment plan with patient and/or surrogate as well as nursing, discussions with consultants, evaluation of patient's response to treatment, examination of patient, obtaining history from patient or surrogate, ordering and performing treatments and interventions, ordering and review of laboratory studies, ordering and review of radiographic studies, pulse oximetry and re-evaluation of patient's condition.  ____________________________________________   INITIAL IMPRESSION / ASSESSMENT AND PLAN / ED COURSE  85 y.o. female with a history of atrial fibrillation on Xarelto, hypertension, CAD, CVA with R sided hemiparesis, liver transplant, blindness, aortic stenosis who presents for evaluation of generalized weakness after being diagnosed with pneumonia 2 days ago.  Patient has normal work of breathing, normal sats, she is afebrile, she has diffuse crackles bilaterally with good air movement.  She is in A. fib with RVR, looks dry on exam, will give IVF before slowing heart rate to prevent patient from becoming hypotensive/ shock.  Will check labs to rule out sepsis, dehydration, anemia, electrolyte abnormalities.  Will repeat chest x-ray to evaluate pneumonia.  Will check flu. will give gentle hydration in the setting of known CHF with decreased EF.     _________________________ 6:21 PM on 06/14/2018 -----------------------------------------  Patient received a total of a liter of fluids, 10 mg of IV Cardizem. PO cardizem ordered. CT of the chest negative for PNA. Patient's generalized weakness most likely from dehydration and A. fib with RVR.  Electrolytes are within normal limits.  No signs of sepsis therefore no antibiotics given at this time.  UA is pending.  Discussed with Dr. Verdell Carmine for admission.   As part of my medical decision making, I reviewed the following data within the [place] notes reviewed and incorporated, Labs reviewed , EKG  interpreted , Old EKG reviewed, Old chart reviewed, Radiograph reviewed , Discussed with admitting physician , Notes from prior ED visits and [place] Controlled Substance Database    Pertinent labs & imaging results that were available during my care of the patient were reviewed by me and considered in my medical decision making (see chart for details).    ____________________________________________   FINAL CLINICAL IMPRESSION(S) / ED DIAGNOSES  Final diagnoses:  Atrial fibrillation with RVR (HCC)  Dehydration  Generalized weakness      NEW MEDICATIONS STARTED DURING THIS VISIT:  ED Discharge Orders    None       Note:  This document was prepared using Dragon voice recognition software and may include unintentional dictation errors.    Rudene Re, MD 06/14/18 2136

## 2018-06-15 ENCOUNTER — Other Ambulatory Visit: Payer: Self-pay

## 2018-06-15 LAB — BASIC METABOLIC PANEL
Anion gap: 7 (ref 5–15)
BUN: 18 mg/dL (ref 8–23)
CO2: 22 mmol/L (ref 22–32)
Calcium: 8.1 mg/dL — ABNORMAL LOW (ref 8.9–10.3)
Chloride: 102 mmol/L (ref 98–111)
Creatinine, Ser: 0.75 mg/dL (ref 0.44–1.00)
GFR calc Af Amer: >60 mL/min (ref >60)
GFR calc non Af Amer: >60 mL/min (ref >60)
Glucose, Bld: 92 mg/dL (ref 70–99)
Potassium: 4.5 mmol/L (ref 3.5–5.1)
Sodium: 131 mmol/L — ABNORMAL LOW (ref 135–145)

## 2018-06-15 LAB — CBC
HCT: 35.1 % — ABNORMAL LOW (ref 36.0–46.0)
Hemoglobin: 11.2 g/dL — ABNORMAL LOW (ref 12.0–15.0)
MCH: 29.6 pg (ref 26.0–34.0)
MCHC: 31.9 g/dL (ref 30.0–36.0)
MCV: 92.9 fL (ref 80.0–100.0)
PLATELETS: 206 10*3/uL (ref 150–400)
RBC: 3.78 MIL/uL — ABNORMAL LOW (ref 3.87–5.11)
RDW: 11.9 % (ref 11.5–15.5)
WBC: 7.5 10*3/uL (ref 4.0–10.5)
nRBC: 0 % (ref 0.0–0.2)

## 2018-06-15 NOTE — Plan of Care (Signed)
  Problem: Activity: Goal: Ability to tolerate increased activity will improve Outcome: Progressing   Problem: Cardiac: Goal: Ability to achieve and maintain adequate cardiopulmonary perfusion will improve Outcome: Progressing   

## 2018-06-15 NOTE — Progress Notes (Signed)
Discharged to home with husband. No changes to medications.  They will make the follow up appointment.

## 2018-06-15 NOTE — Progress Notes (Signed)
PT Cancellation Note  Patient Details Name: Brenda Roman MRN: [id number] DOB: Dec 04, 1923   Cancelled Treatment:    Reason Eval/Treat Not Completed: Patient declined, no reason specified.  PT consult received.  Chart reviewed.  Pt and pt's husband declining PT consult during hospital stay.  [name] reports PT is a "waste of time" and that pt requires significant 1 person assist for transfers at home at baseline (has PCA 8 am to 1 pm that assists with transfers and pt's husband assists with transfers when PCA is not present).  Pt and pt's husband report no issues and or concerns with home assist levels (has been doing this for "4 years" per pt's husband).  Pt has BSC, hospital bed, manual w/c, and handicap accessible bathroom in home.  Discussed above with pt's nurse and [name].  Per pt and pt's husband request for no PT during hospital stay, will discontinue current PT order:  Discussed with [name].  Leitha Bleak, PT 06/15/18, 10:54 AM [phone]

## 2018-06-15 NOTE — Discharge Summary (Signed)
[facility] NAME: Brenda Roman    MR#:  [id number]  DATE OF BIRTH:  [birth year]  DATE OF ADMISSION:  06/14/2018 ADMITTING PHYSICIAN: Henreitta Leber, MD  DATE OF DISCHARGE: 06/15/2018 11:56 AM  PRIMARY CARE PHYSICIAN: Tracie Harrier, MD   ADMISSION DIAGNOSIS:  Dehydration [E86.0] Generalized weakness [R53.1] Atrial fibrillation with RVR ([place]) [I48.91]  DISCHARGE DIAGNOSIS:  Active Problems:   Atrial fibrillation with RVR ([place]) Dehydration  SECONDARY DIAGNOSIS:   Past Medical History:  Diagnosis Date  . Aortic stenosis   . 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 Cancer ([place])    skin  . CHF (congestive heart failure) ([place])   . Coronary artery disease   . CVA (cerebral infarction) June '16  . Hypertension   . Liver transplanted ([place])   . Macular degeneration   . Mitral valve disease   . PBC (primary biliary cirrhosis)    s/p liver transplant  . Shingles   . TIA (transient ischemic attack)      ADMITTING HISTORY Brenda Roman  is a 85 y.o. 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  Patient is very hard of hearing therefore most of the history obtained from the husband at bedside.  As per the patient's husband she has been more more lethargic and complaining of some nausea over the past few days.  He brought her to the ER today as he was attempting to get her from her bed to a wheelchair and her legs gave way and she fell to the ground.  Patient was seen by her primary care physician a few days ago and thought to have suspected pneumonia/bronchitis and therefore started on Zithromax but despite being on that she continues to have progressive symptoms as mentioned.  She was brought to the ER for further evaluation and her work-up was  essentially benign with a negative chest x-ray and CT chest for pneumonia, negative urinalysis but she was noted to have some atrial fibrillation with rapid ventricular response.  Patient received some IV fluids but despite that continued to have heart rates there are labile, she received some IV Cardizem bolus and then became hypotensive.  She was noted to be mildly hyponatremic.  Hospitalist services therefore were contacted for admission.  HOSPITAL COURSE:  Patient was admitted to telemetry.  Heart rate was controlled with oral metoprolol.  Cardiology consultation was done who recommended no intervention.  No further work-up recommended.  Patient will be discharged home.  CONSULTS OBTAINED:  Treatment Team:  Yolonda Kida, MD  DRUG ALLERGIES:   Allergies  Allergen Reactions  . Cephalexin Other (See Comments)    Reaction:  Unknown  . Trazodone     Other reaction(s): Other (See Comments) Bad Dreams   . Zaleplon     Other reaction(s): Other (See Comments) Weird Dreams  . Zolpidem     Other reaction(s): Other (See Comments) Memory, worried her to take it  . Novocain [Procaine] Rash  . Sulfa Antibiotics Rash    DISCHARGE MEDICATIONS:   Allergies as of 06/15/2018      Reactions   Cephalexin Other (See Comments)   Reaction:  Unknown   Trazodone    Other reaction(s): Other (See Comments) Bad Dreams    Zaleplon    Other reaction(s): Other (  See Comments) Weird Dreams   Zolpidem    Other reaction(s): Other (See Comments) Memory, worried her to take it   Novocain [procaine] Rash   Sulfa Antibiotics Rash      Medication List    TAKE these medications   bisacodyl 5 MG EC tablet Commonly known as:  DULCOLAX Take 5 mg by mouth 2 (two) times daily.   hydrOXYzine 25 MG tablet Commonly known as:  ATARAX/VISTARIL Take 25 mg by mouth 3 (three) times daily as needed for itching.   LORazepam 2 MG tablet Commonly known as:  ATIVAN Take 1 mg by mouth 2 (two) times daily.  Early evening and at about 2300   Melatonin 1 MG Tabs Take 1 mg by mouth at bedtime as needed (sleep).   metoprolol succinate 50 MG 24 hr tablet Commonly known as:  TOPROL-XL Take 50 mg by mouth daily. Take with or immediately following a meal.   mycophenolate 500 MG tablet Commonly known as:  CELLCEPT Take 500 mg by mouth daily.   nitroGLYCERIN 0.4 MG SL tablet Commonly known as:  NITROSTAT Place 1 tablet under the tongue as needed. Reported on 10/26/2015   predniSONE 5 MG tablet Commonly known as:  DELTASONE Take 5 mg by mouth daily. What changed:  Another medication with the same name was removed. Continue taking this medication, and follow the directions you see here.       Today  Patient seen today No complaints No chest pain No palpitations Hemodynamically stable  VITAL SIGNS:  Blood pressure 133/82, pulse 73, temperature 98.2 F (36.8 C), temperature source Oral, resp. rate 19, height 5\' 1"  (1.549 m), weight 50.2 kg, SpO2 91 %.  I/O:    Intake/Output Summary (Last 24 hours) at 06/15/2018 1234 Last data filed at 06/15/2018 0851 Gross per 24 hour  Intake 1358.09 ml  Output 450 ml  Net 908.09 ml    PHYSICAL EXAMINATION:  Physical Exam  GENERAL:  85 y.o.-year-old patient lying in the bed with no acute distress.  LUNGS: Normal breath sounds bilaterally, no wheezing, rales,rhonchi or crepitation. No use of accessory muscles of respiration.  CARDIOVASCULAR: S1, S2 normal. No murmurs, rubs, or gallops.  ABDOMEN: Soft, non-tender, non-distended. Bowel sounds present. No organomegaly or mass.  NEUROLOGIC: Moves all 4 extremities. PSYCHIATRIC: The patient is alert and oriented x 3.  SKIN: No obvious rash, lesion, or ulcer.   DATA REVIEW:   CBC Recent Labs  Lab 06/15/18 0348  WBC 7.5  HGB 11.2*  HCT 35.1*  PLT 206    Chemistries  Recent Labs  Lab 06/14/18 1453 06/15/18 0348  NA 126* 131*  K 4.8 4.5  CL 95* 102  CO2 24 22  GLUCOSE 131* 92  BUN 18  18  CREATININE 0.87 0.75  CALCIUM 8.5* 8.1*  AST 18  --   ALT 10  --   ALKPHOS 81  --   BILITOT 0.5  --     Cardiac Enzymes No results for input(s): TROPONINI in the last 168 hours.  Microbiology Results  Results for orders placed or performed during the hospital encounter of 05/23/18  Urine Culture     Status: Abnormal   Collection Time: 05/23/18  4:55 PM  Result Value Ref Range Status   Specimen Description   Final    URINE, RANDOM Performed at [facility], [address] [place], [place] [address]    Special Requests   Final    NONE Performed at [facility], [phone]  [place], [place] [address]    Culture 60,000 COLONIES/mL ENTEROBACTER CLOACAE (A)  Final   Report Status 05/26/2018 FINAL  Final   Organism ID, Bacteria ENTEROBACTER CLOACAE (A)  Final      Susceptibility   Enterobacter cloacae - MIC*    CEFAZOLIN RESISTANT Resistant     CEFTRIAXONE <=1 SENSITIVE Sensitive     CIPROFLOXACIN <=0.25 SENSITIVE Sensitive     GENTAMICIN <=1 SENSITIVE Sensitive     IMIPENEM <=0.25 SENSITIVE Sensitive     NITROFURANTOIN 64 INTERMEDIATE Intermediate     TRIMETH/SULFA <=20 SENSITIVE Sensitive     PIP/TAZO <=4 SENSITIVE Sensitive     * 60,000 COLONIES/mL ENTEROBACTER CLOACAE    RADIOLOGY:  Dg Chest 2 View  Result Date: 06/14/2018 CLINICAL DATA:  Pain all over. Possible pneumonia. EXAM: CHEST - 2 VIEW COMPARISON:  05/23/2018 FINDINGS: Enlarged heart. Calcific atherosclerotic disease and tortuosity of the aorta. Stable postsurgical changes. Mild pulmonary vascular congestion Right pleural effusion. Stable compression deformities of the lower thoracic vertebral bodies. IMPRESSION: 1. Cardiomegaly with mild pulmonary vascular congestion. 2. Right pleural effusion. Electronically Signed   By: Fidela Salisbury M.D.   On: 06/14/2018 16:35   Ct Chest W Contrast  Result Date: 06/14/2018 CLINICAL DATA:  Altered mental status.  Lethargy. EXAM: CT CHEST WITH  CONTRAST TECHNIQUE: Multidetector CT imaging of the chest was performed during intravenous contrast administration. CONTRAST:  82mL OMNIPAQUE IOHEXOL 300 MG/ML  SOLN COMPARISON:  Chest x-ray from same day. CT chest dated January 30, 2017. FINDINGS: Cardiovascular: Stable cardiomegaly with prominent left atrial enlargement. No thoracic aortic aneurysm or dissection. Coronary, aortic arch, and branch vessel atherosclerotic vascular disease. No pulmonary embolism. Stable moderate dilatation of the left and right pulmonary arteries. Mediastinum/Nodes: No enlarged mediastinal, hilar, or axillary lymph nodes. Thyroid gland, trachea, and esophagus demonstrate no significant findings. Lungs/Pleura: Small right pleural effusion. Small amount of loculated pleural fluid along the superior right major fissure. Associated subsegmental atelectasis in the right lower lobe. Unchanged scarring at the lung bases. No consolidation or pneumothorax. Interval mild enlargement of a pleural based nodule along the superior left major fissure, now measuring 6 x 10 mm, previously 4 x 7 mm. Upper Abdomen: No acute abnormality. Unchanged pneumobilia and left renal cyst. Musculoskeletal: No chest wall abnormality. No acute or significant osseous findings. Unchanged chronic thoracic compression deformities. Unchanged healed bilateral posterior rib fractures. IMPRESSION: 1. Small right pleural effusion with adjacent right lower lobe atelectasis. No evidence of pneumonia. 2. Slight interval increase in size of a pleural-based nodule along the superior left major fissure, now measuring 8 mm in aggregate size, previously 6 mm. Non-contrast chest CT at 6-12 months is recommended. If the nodule is stable at time of repeat CT, then future CT at 18-24 months (from today's scan) is considered optional for low-risk patients, but is recommended for high-risk patients. This recommendation follows the consensus statement: Guidelines for Management of  Incidental Pulmonary Nodules Detected on CT Images: From the Fleischner Society 2017; Radiology 2017; 284:228-243. 3. Stable pulmonary artery enlargement, suggestive of pulmonary arterial hypertension. 4.  Aortic atherosclerosis (ICD10-I70.0). Electronically Signed   By: Titus Dubin M.D.   On: 06/14/2018 18:03    Follow up with PCP in 1 week.  Management plans discussed with the patient, family and they are in agreement.  CODE STATUS: DNR    Code Status Orders  (From admission, onward)         Start     Ordered   06/14/18  2044  Do not attempt resuscitation (DNR)  Continuous    Question Answer Comment  In the event of cardiac or respiratory ARREST Do not call a "code blue"   In the event of cardiac or respiratory ARREST Do not perform Intubation, CPR, defibrillation or ACLS   In the event of cardiac or respiratory ARREST Use medication by any route, position, wound care, and other measures to relive pain and suffering. May use oxygen, suction and manual treatment of airway obstruction as needed for comfort.   Comments confirmed with daughter and husband.      06/14/18 2043        Code Status History    Date Active Date Inactive Code Status Order ID Comments User Context   06/05/2017 1748 06/07/2017 1430 DNR [id number]  Henreitta Leber, MD Inpatient   04/23/2017 1142 04/24/2017 1547 DNR [id number]  Vaughan Basta, MD Inpatient   11/23/2016 2001 11/27/2016 1851 Full Code [id number]  Nicholes Mango, MD Inpatient   09/19/2015 0504 09/21/2015 1210 DNR [id number]  Mikael Spray, NP ED   09/19/2015 0458 09/19/2015 0458 Full Code [id number]  Mikael Spray, NP ED   07/30/2015 0508 08/01/2015 1417 DNR [id number]  Harrie Foreman, MD Inpatient   07/18/2015 2121 07/20/2015 1459 DNR [id number]  Dustin Flock, MD Inpatient   08/29/2014 2102 09/05/2014 1527 DNR [id number]  Demetrios Loll, MD ED   08/23/2014 1755 08/25/2014 1742 Full Code [id number]  Bettey Costa, MD Inpatient    Advance Directive  Documentation     Most Recent Value  Type of Advance Directive  Living will  Pre-existing out of facility DNR order (yellow form or pink MOST form)  -  "MOST" Form in Place?  -      TOTAL TIME TAKING CARE OF THIS PATIENT ON DAY OF DISCHARGE: more than 33 minutes.   Saundra Shelling M.D on 06/15/2018 at 12:34 PM  Between 7am to 6pm - Pager - [phone]  After 6pm go to [email or web address] - password [email or web address] [facility]  Office  [phone]  CC: Primary care physician; Tracie Harrier, MD  Note: This dictation was prepared with Dragon dictation along with smaller phrase technology. Any transcriptional errors that result from this process are unintentional.

## 2018-06-15 NOTE — Care Management Obs Status (Signed)
[facility] NOTIFICATION   Patient Details  Name: Brenda Roman MRN: [id number] Date of Birth: [birth year]   Medicare Observation Status Notification Given:  Yes    [name] A Mishael Krysiak, RN 06/15/2018, 11:14 AM

## 2018-06-15 NOTE — Progress Notes (Signed)
Advanced care plan. Purpose of the Encounter: CODE STATUS Parties in Attendance: Patient and family Patient's Decision Capacity: Okay Subjective/Patient's story: Presented to the emergency room for nausea and rapid heart rate Objective/Medical story Was admitted for A. fib with rapid rate Was given IV Cardizem in the emergency room Needs rate control and cardiology evaluation Goals of care determination:  Advance care directives goals of care and treatment plan discussed Patient and family does not want CPR, intubation ventilator the need arises CODE STATUS: DNR Time spent discussing advanced care planning: 16 minutes

## 2018-06-16 NOTE — Consult Note (Signed)
Reason for Consult: Nausea atrial fibrillation Referring Physician: Dr. Verdell Carmine hospitalist Dr. Ginette Pitman primary   Brenda Roman is an 85 y.o. female.  HPI: 85 year old female known coronary disease previous CVA hypertension primary due to cirrhosis resulting in liver transplant about 30 years ago patient also suffers shingles with an interim blindness she has had some recent weakness patient was brought to the emergency room by her husband she had episodes of nausea in the past few days patient is was evaluated with possible pneumonia pneumonia bronchitis and treated with antibiotic therapy.  Patient was brought to the ER for work-up x-ray and CT were negative.  Patient was found to have some episodes of atrial fibrillation rapid ventricular response she was treated with IV fluids as well as diltiazem and rate was improved hospitalist was then consulted for further evaluation and and cardiology.  Patient has a known previous history of atrial fibrillation but is a poor anticoagulation candidate because of bleeding in the past that she is been treated conservatively and medically  Past Medical History:  Diagnosis Date  . Aortic stenosis   . 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 Cancer ([place])    skin  . CHF (congestive heart failure) ([place])   . Coronary artery disease   . CVA (cerebral infarction) June '16  . Hypertension   . Liver transplanted ([place])   . Macular degeneration   . Mitral valve disease   . PBC (primary biliary cirrhosis)    s/p liver transplant  . Shingles   . TIA (transient ischemic attack)     Past Surgical History:  Procedure Laterality Date  . BOWEL RESECTION    . COLONOSCOPY    . CORONARY ARTERY BYPASS GRAFT    . JOINT REPLACEMENT    . LIVER TRANSPLANT  1990    Family History  Problem Relation Age of Onset  . Hypertension Father   . Heart attack Father   . Stroke Mother     Social History:  reports that she has quit smoking. She has never used smokeless tobacco. She  reports that she does not drink alcohol or use drugs.  Allergies:  Allergies  Allergen Reactions  . Cephalexin Other (See Comments)    Reaction:  Unknown  . Trazodone     Other reaction(s): Other (See Comments) Bad Dreams   . Zaleplon     Other reaction(s): Other (See Comments) Weird Dreams  . Zolpidem     Other reaction(s): Other (See Comments) Memory, worried her to take it  . Novocain [Procaine] Rash  . Sulfa Antibiotics Rash    Medications: I have reviewed the patient's current medications.  Results for orders placed or performed during the hospital encounter of 06/14/18 (from the past 48 hour(s))  CBC with Differential/Platelet     Status: None   Collection Time: 06/14/18  2:53 PM  Result Value Ref Range   WBC 8.0 4.0 - 10.5 K/uL   RBC 4.23 3.87 - 5.11 MIL/uL   Hemoglobin 12.7 12.0 - 15.0 g/dL   HCT 38.9 36.0 - 46.0 %   MCV 92.0 80.0 - 100.0 fL   MCH 30.0 26.0 - 34.0 pg   MCHC 32.6 30.0 - 36.0 g/dL   RDW 11.9 11.5 - 15.5 %   Platelets 202 150 - 400 K/uL   nRBC 0.0 0.0 - 0.2 %   Neutrophils Relative % 88 %   Neutro Abs 7.0 1.7 - 7.7 K/uL   Lymphocytes Relative 9 %   Lymphs  Abs 0.7 0.7 - 4.0 K/uL   Monocytes Relative 2 %   Monocytes Absolute 0.2 0.1 - 1.0 K/uL   Eosinophils Relative 0 %   Eosinophils Absolute 0.0 0.0 - 0.5 K/uL   Basophils Relative 0 %   Basophils Absolute 0.0 0.0 - 0.1 K/uL   Immature Granulocytes 1 %   Abs Immature Granulocytes 0.06 0.00 - 0.07 K/uL    Comment: Performed at [facility], [address] [place], [place] [address]  Comprehensive metabolic panel     Status: Abnormal   Collection Time: 06/14/18  2:53 PM  Result Value Ref Range   Sodium 126 (L) 135 - 145 mmol/L   Potassium 4.8 3.5 - 5.1 mmol/L   Chloride 95 (L) 98 - 111 mmol/L   CO2 24 22 - 32 mmol/L   Glucose, Bld 131 (H) 70 - 99 mg/dL   BUN 18 8 - 23 mg/dL   Creatinine, Ser 0.87 0.44 - 1.00 mg/dL   Calcium 8.5 (L) 8.9 - 10.3 mg/dL   Total Protein 6.6 6.5 - 8.1  g/dL   Albumin 3.8 3.5 - 5.0 g/dL   AST 18 15 - 41 U/L   ALT 10 0 - 44 U/L   Alkaline Phosphatase 81 38 - 126 U/L   Total Bilirubin 0.5 0.3 - 1.2 mg/dL   GFR calc non Af Amer 58 (L) >60 mL/min   GFR calc Af Amer >60 >60 mL/min   Anion gap 7 5 - 15    Comment: Performed at [facility], [address] [place], [place] [address]  Lactic acid, plasma     Status: None   Collection Time: 06/14/18  2:53 PM  Result Value Ref Range   Lactic Acid, Venous 1.4 0.5 - 1.9 mmol/L    Comment: Performed at [facility], [address] [place], [place] [address]  Influenza panel by PCR (type A & B)     Status: None   Collection Time: 06/14/18  2:54 PM  Result Value Ref Range   Influenza A By PCR NEGATIVE NEGATIVE   Influenza B By PCR NEGATIVE NEGATIVE    Comment: (NOTE) The Xpert Xpress Flu assay is intended as an aid in the diagnosis of  influenza and should not be used as a sole basis for treatment.  This  assay is FDA approved for nasopharyngeal swab specimens only. Nasal  washings and aspirates are unacceptable for Xpert Xpress Flu testing. Performed at [facility], [address] [place], [place] [address]   Urinalysis, Complete w Microscopic     Status: Abnormal   Collection Time: 06/14/18  6:31 PM  Result Value Ref Range   Color, Urine YELLOW (A) YELLOW   APPearance CLEAR (A) CLEAR   Specific Gravity, Urine 1.021 1.005 - 1.030   pH 5.0 5.0 - 8.0   Glucose, UA NEGATIVE NEGATIVE mg/dL   Hgb urine dipstick NEGATIVE NEGATIVE   Bilirubin Urine NEGATIVE NEGATIVE   Ketones, ur NEGATIVE NEGATIVE mg/dL   Protein, ur NEGATIVE NEGATIVE mg/dL   Nitrite NEGATIVE NEGATIVE   Leukocytes,Ua SMALL (A) NEGATIVE   RBC / HPF 0-5 0 - 5 RBC/hpf   WBC, UA 11-20 0 - 5 WBC/hpf   Bacteria, UA NONE SEEN NONE SEEN   Squamous Epithelial / LPF 0-5 0 - 5    Comment: Performed at [facility], [address], [place], [place] [address]  Basic metabolic panel      Status: Abnormal   Collection Time:  06/15/18  3:48 AM  Result Value Ref Range   Sodium 131 (L) 135 - 145 mmol/L   Potassium 4.5 3.5 - 5.1 mmol/L   Chloride 102 98 - 111 mmol/L   CO2 22 22 - 32 mmol/L   Glucose, Bld 92 70 - 99 mg/dL   BUN 18 8 - 23 mg/dL   Creatinine, Ser 0.75 0.44 - 1.00 mg/dL   Calcium 8.1 (L) 8.9 - 10.3 mg/dL   GFR calc non Af Amer >60 >60 mL/min   GFR calc Af Amer >60 >60 mL/min   Anion gap 7 5 - 15    Comment: Performed at [facility], [address] [place], [place] [address]  CBC     Status: Abnormal   Collection Time: 06/15/18  3:48 AM  Result Value Ref Range   WBC 7.5 4.0 - 10.5 K/uL   RBC 3.78 (L) 3.87 - 5.11 MIL/uL   Hemoglobin 11.2 (L) 12.0 - 15.0 g/dL   HCT 35.1 (L) 36.0 - 46.0 %   MCV 92.9 80.0 - 100.0 fL   MCH 29.6 26.0 - 34.0 pg   MCHC 31.9 30.0 - 36.0 g/dL   RDW 11.9 11.5 - 15.5 %   Platelets 206 150 - 400 K/uL   nRBC 0.0 0.0 - 0.2 %    Comment: Performed at [facility], [address], [place], [place] [address]    Dg Chest 2 View  Result Date: 06/14/2018 CLINICAL DATA:  Pain all over. Possible pneumonia. EXAM: CHEST - 2 VIEW COMPARISON:  05/23/2018 FINDINGS: Enlarged heart. Calcific atherosclerotic disease and tortuosity of the aorta. Stable postsurgical changes. Mild pulmonary vascular congestion Right pleural effusion. Stable compression deformities of the lower thoracic vertebral bodies. IMPRESSION: 1. Cardiomegaly with mild pulmonary vascular congestion. 2. Right pleural effusion. Electronically Signed   By: Fidela Salisbury M.D.   On: 06/14/2018 16:35   Ct Chest W Contrast  Result Date: 06/14/2018 CLINICAL DATA:  Altered mental status.  Lethargy. EXAM: CT CHEST WITH CONTRAST TECHNIQUE: Multidetector CT imaging of the chest was performed during intravenous contrast administration. CONTRAST:  1mL OMNIPAQUE IOHEXOL 300 MG/ML  SOLN COMPARISON:  Chest x-ray from same day. CT chest dated January 30, 2017. FINDINGS:  Cardiovascular: Stable cardiomegaly with prominent left atrial enlargement. No thoracic aortic aneurysm or dissection. Coronary, aortic arch, and branch vessel atherosclerotic vascular disease. No pulmonary embolism. Stable moderate dilatation of the left and right pulmonary arteries. Mediastinum/Nodes: No enlarged mediastinal, hilar, or axillary lymph nodes. Thyroid gland, trachea, and esophagus demonstrate no significant findings. Lungs/Pleura: Small right pleural effusion. Small amount of loculated pleural fluid along the superior right major fissure. Associated subsegmental atelectasis in the right lower lobe. Unchanged scarring at the lung bases. No consolidation or pneumothorax. Interval mild enlargement of a pleural based nodule along the superior left major fissure, now measuring 6 x 10 mm, previously 4 x 7 mm. Upper Abdomen: No acute abnormality. Unchanged pneumobilia and left renal cyst. Musculoskeletal: No chest wall abnormality. No acute or significant osseous findings. Unchanged chronic thoracic compression deformities. Unchanged healed bilateral posterior rib fractures. IMPRESSION: 1. Small right pleural effusion with adjacent right lower lobe atelectasis. No evidence of pneumonia. 2. Slight interval increase in size of a pleural-based nodule along the superior left major fissure, now measuring 8 mm in aggregate size, previously 6 mm. Non-contrast chest CT at 6-12 months is recommended. If the nodule is stable at time of repeat CT, then future CT at 18-24 months (from today's scan) is  considered optional for low-risk patients, but is recommended for high-risk patients. This recommendation follows the consensus statement: Guidelines for Management of Incidental Pulmonary Nodules Detected on CT Images: From the Fleischner Society 2017; Radiology 2017; 284:228-243. 3. Stable pulmonary artery enlargement, suggestive of pulmonary arterial hypertension. 4.  Aortic atherosclerosis (ICD10-I70.0).  Electronically Signed   By: Titus Dubin M.D.   On: 06/14/2018 18:03    Review of Systems  Constitutional: Positive for diaphoresis, malaise/fatigue and weight loss.  HENT: Positive for hearing loss.   Eyes:       Clinical blindness  Respiratory: Negative.   Cardiovascular: Negative.   Gastrointestinal: Negative.   Genitourinary: Negative.   Musculoskeletal: Negative.   Skin: Negative.   Neurological: Negative.   Psychiatric/Behavioral: Negative.    Blood pressure 133/82, pulse 73, temperature 98.2 F (36.8 C), temperature source Oral, resp. rate 19, height 5\' 1"  (1.549 m), weight 50.2 kg, SpO2 91 %. Physical Exam  Nursing note and vitals reviewed. Constitutional: She appears well-developed and well-nourished.  HENT:  Head: Normocephalic and atraumatic.  Eyes:  Bilateral blindness  Neck: Normal range of motion. Neck supple.  Respiratory: Effort normal and breath sounds normal.  GI: Soft. Bowel sounds are normal.  Musculoskeletal: Normal range of motion.  Neurological: She is alert. She has normal reflexes.  Skin: Skin is warm and dry.    Assessment/Plan: Atrial fibrillation History of liver transplant Blindness Nausea Coronary artery disease CVA Hypertension Congestive heart failure History of shingles Previous hypotension Chest pain . Plan Agree with admit rule out myocardial infarction Follow-up cardiac enzymes and EKGs Consider echocardiogram for further assessment evaluation Recommend hypertension evaluation and monitoring Coronary disease reasonably stable History of CVA Have the patient follow-up as an outpatient with primary and cardiology  Matteson Blue D Elzia Hott 06/16/2018, 12:08 AM

## 2018-06-17 ENCOUNTER — Other Ambulatory Visit: Payer: Self-pay

## 2018-06-17 ENCOUNTER — Encounter: Payer: Self-pay | Admitting: Emergency Medicine

## 2018-06-17 ENCOUNTER — Emergency Department: Payer: Medicare Other

## 2018-06-17 ENCOUNTER — Observation Stay
Admission: EM | Admit: 2018-06-17 | Discharge: 2018-06-20 | Disposition: A | Discharge status: Hospice | Payer: Medicare Other | Attending: Specialist | Admitting: Specialist

## 2018-06-17 DIAGNOSIS — K743 Primary biliary cirrhosis: Secondary | ICD-10-CM | POA: Diagnosis not present

## 2018-06-17 DIAGNOSIS — R778 Other specified abnormalities of plasma proteins: Secondary | ICD-10-CM

## 2018-06-17 DIAGNOSIS — Z66 Do not resuscitate: Secondary | ICD-10-CM | POA: Diagnosis not present

## 2018-06-17 DIAGNOSIS — Z87891 Personal history of nicotine dependence: Secondary | ICD-10-CM | POA: Insufficient documentation

## 2018-06-17 DIAGNOSIS — Z944 Liver transplant status: Secondary | ICD-10-CM | POA: Insufficient documentation

## 2018-06-17 DIAGNOSIS — Z823 Family history of stroke: Secondary | ICD-10-CM | POA: Diagnosis not present

## 2018-06-17 DIAGNOSIS — K7460 Unspecified cirrhosis of liver: Secondary | ICD-10-CM | POA: Insufficient documentation

## 2018-06-17 DIAGNOSIS — I420 Dilated cardiomyopathy: Secondary | ICD-10-CM | POA: Insufficient documentation

## 2018-06-17 DIAGNOSIS — I2510 Atherosclerotic heart disease of native coronary artery without angina pectoris: Secondary | ICD-10-CM | POA: Insufficient documentation

## 2018-06-17 DIAGNOSIS — E871 Hypo-osmolality and hyponatremia: Secondary | ICD-10-CM | POA: Diagnosis not present

## 2018-06-17 DIAGNOSIS — I5043 Acute on chronic combined systolic (congestive) and diastolic (congestive) heart failure: Secondary | ICD-10-CM | POA: Insufficient documentation

## 2018-06-17 DIAGNOSIS — I080 Rheumatic disorders of both mitral and aortic valves: Secondary | ICD-10-CM | POA: Insufficient documentation

## 2018-06-17 DIAGNOSIS — J9621 Acute and chronic respiratory failure with hypoxia: Secondary | ICD-10-CM | POA: Diagnosis not present

## 2018-06-17 DIAGNOSIS — Z888 Allergy status to other drugs, medicaments and biological substances status: Secondary | ICD-10-CM | POA: Diagnosis not present

## 2018-06-17 DIAGNOSIS — I639 Cerebral infarction, unspecified: Secondary | ICD-10-CM | POA: Diagnosis present

## 2018-06-17 DIAGNOSIS — E782 Mixed hyperlipidemia: Secondary | ICD-10-CM | POA: Insufficient documentation

## 2018-06-17 DIAGNOSIS — Z881 Allergy status to other antibiotic agents status: Secondary | ICD-10-CM | POA: Insufficient documentation

## 2018-06-17 DIAGNOSIS — I4891 Unspecified atrial fibrillation: Secondary | ICD-10-CM | POA: Diagnosis present

## 2018-06-17 DIAGNOSIS — Z951 Presence of aortocoronary bypass graft: Secondary | ICD-10-CM | POA: Insufficient documentation

## 2018-06-17 DIAGNOSIS — I110 Hypertensive heart disease with heart failure: Secondary | ICD-10-CM | POA: Diagnosis not present

## 2018-06-17 DIAGNOSIS — R42 Dizziness and giddiness: Secondary | ICD-10-CM

## 2018-06-17 DIAGNOSIS — Z8249 Family history of ischemic heart disease and other diseases of the circulatory system: Secondary | ICD-10-CM | POA: Diagnosis not present

## 2018-06-17 DIAGNOSIS — R7989 Other specified abnormal findings of blood chemistry: Secondary | ICD-10-CM

## 2018-06-17 DIAGNOSIS — I4821 Permanent atrial fibrillation: Principal | ICD-10-CM | POA: Insufficient documentation

## 2018-06-17 DIAGNOSIS — Z79899 Other long term (current) drug therapy: Secondary | ICD-10-CM | POA: Diagnosis not present

## 2018-06-17 DIAGNOSIS — Z8673 Personal history of transient ischemic attack (TIA), and cerebral infarction without residual deficits: Secondary | ICD-10-CM | POA: Insufficient documentation

## 2018-06-17 DIAGNOSIS — I248 Other forms of acute ischemic heart disease: Secondary | ICD-10-CM | POA: Insufficient documentation

## 2018-06-17 DIAGNOSIS — Z882 Allergy status to sulfonamides status: Secondary | ICD-10-CM | POA: Diagnosis not present

## 2018-06-17 DIAGNOSIS — R001 Bradycardia, unspecified: Secondary | ICD-10-CM | POA: Insufficient documentation

## 2018-06-17 LAB — CBC WITH DIFFERENTIAL/PLATELET
Abs Immature Granulocytes: 0.04 10*3/uL (ref 0.00–0.07)
Basophils Absolute: 0.1 10*3/uL (ref 0.0–0.1)
Basophils Relative: 1 %
Eosinophils Absolute: 0.2 10*3/uL (ref 0.0–0.5)
Eosinophils Relative: 3 %
HCT: 37.3 % (ref 36.0–46.0)
Hemoglobin: 12.1 g/dL (ref 12.0–15.0)
Immature Granulocytes: 1 %
LYMPHS ABS: 1.9 10*3/uL (ref 0.7–4.0)
Lymphocytes Relative: 30 %
MCH: 30 pg (ref 26.0–34.0)
MCHC: 32.4 g/dL (ref 30.0–36.0)
MCV: 92.3 fL (ref 80.0–100.0)
Monocytes Absolute: 0.6 10*3/uL (ref 0.1–1.0)
Monocytes Relative: 10 %
Neutro Abs: 3.5 10*3/uL (ref 1.7–7.7)
Neutrophils Relative %: 55 %
Platelets: 171 10*3/uL (ref 150–400)
RBC: 4.04 MIL/uL (ref 3.87–5.11)
RDW: 12.1 % (ref 11.5–15.5)
WBC: 6.4 10*3/uL (ref 4.0–10.5)
nRBC: 0 % (ref 0.0–0.2)

## 2018-06-17 LAB — COMPREHENSIVE METABOLIC PANEL
ALT: 14 U/L (ref 0–44)
AST: 19 U/L (ref 15–41)
Albumin: 3.3 g/dL — ABNORMAL LOW (ref 3.5–5.0)
Alkaline Phosphatase: 78 U/L (ref 38–126)
Anion gap: 6 (ref 5–15)
BUN: 17 mg/dL (ref 8–23)
CO2: 22 mmol/L (ref 22–32)
Calcium: 8.4 mg/dL — ABNORMAL LOW (ref 8.9–10.3)
Chloride: 100 mmol/L (ref 98–111)
Creatinine, Ser: 0.74 mg/dL (ref 0.44–1.00)
GFR calc non Af Amer: >60 mL/min (ref >60)
Glucose, Bld: 92 mg/dL (ref 70–99)
Potassium: 4.4 mmol/L (ref 3.5–5.1)
Sodium: 128 mmol/L — ABNORMAL LOW (ref 135–145)
TOTAL PROTEIN: 6.2 g/dL — AB (ref 6.5–8.1)
Total Bilirubin: 0.7 mg/dL (ref 0.3–1.2)

## 2018-06-17 LAB — TROPONIN I
TROPONIN I: 0.2 ng/mL — AB (ref <0.03)
Troponin I: 0.18 ng/mL (ref <0.03)

## 2018-06-17 LAB — URINALYSIS, COMPLETE (UACMP) WITH MICROSCOPIC
Bilirubin Urine: NEGATIVE
Glucose, UA: NEGATIVE mg/dL
Ketones, ur: NEGATIVE mg/dL
Nitrite: NEGATIVE
Protein, ur: NEGATIVE mg/dL
SPECIFIC GRAVITY, URINE: 1.008 (ref 1.005–1.030)
pH: 5 (ref 5.0–8.0)

## 2018-06-17 LAB — TSH: TSH: 4.778 u[IU]/mL — ABNORMAL HIGH (ref 0.350–4.500)

## 2018-06-17 LAB — MAGNESIUM: Magnesium: 2.1 mg/dL (ref 1.7–2.4)

## 2018-06-17 MED ORDER — MELATONIN 5 MG PO TABS
2.5000 mg | ORAL_TABLET | Freq: Every evening | ORAL | Status: DC | PRN
  Administered 2018-06-17 – 2018-06-18 (×2): 2.5 mg via ORAL
  Filled 2018-06-17 (×3): qty 0.5

## 2018-06-17 MED ORDER — SODIUM CHLORIDE 0.9 % IV BOLUS
250.0000 mL | Freq: Once | INTRAVENOUS | Status: AC
  Administered 2018-06-17: 250 mL via INTRAVENOUS

## 2018-06-17 MED ORDER — NITROGLYCERIN 0.4 MG SL SUBL: 0.4000 mg | SUBLINGUAL_TABLET | SUBLINGUAL | Status: DC | PRN

## 2018-06-17 MED ORDER — DOCUSATE SODIUM 100 MG PO CAPS: 100.0000 mg | ORAL_CAPSULE | Freq: Two times a day (BID) | ORAL | Status: DC | PRN

## 2018-06-17 MED ORDER — METOPROLOL SUCCINATE ER 25 MG PO TB24
25.0000 mg | ORAL_TABLET | Freq: Every day | ORAL | Status: DC
  Administered 2018-06-17: 25 mg via ORAL
  Filled 2018-06-17: qty 1

## 2018-06-17 MED ORDER — METOPROLOL SUCCINATE ER 50 MG PO TB24
50.0000 mg | ORAL_TABLET | Freq: Every day | ORAL | Status: DC
  Administered 2018-06-18: 50 mg via ORAL
  Filled 2018-06-17: qty 1

## 2018-06-17 MED ORDER — DILTIAZEM HCL 25 MG/5ML IV SOLN
10.0000 mg | Freq: Once | INTRAVENOUS | Status: AC
  Administered 2018-06-17: 10 mg via INTRAVENOUS
  Filled 2018-06-17: qty 5

## 2018-06-17 MED ORDER — HYDROXYZINE HCL 25 MG PO TABS
25.0000 mg | ORAL_TABLET | Freq: Three times a day (TID) | ORAL | Status: DC | PRN
  Administered 2018-06-19: 25 mg via ORAL
  Administered 2018-06-19: 12.5 mg via ORAL
  Administered 2018-06-20: 25 mg via ORAL
  Filled 2018-06-17 (×3): qty 1

## 2018-06-17 MED ORDER — ONDANSETRON HCL 4 MG/2ML IJ SOLN
4.0000 mg | Freq: Once | INTRAMUSCULAR | Status: AC
  Administered 2018-06-17: 4 mg via INTRAVENOUS
  Filled 2018-06-17: qty 2

## 2018-06-17 MED ORDER — LORAZEPAM 1 MG PO TABS
1.0000 mg | ORAL_TABLET | Freq: Two times a day (BID) | ORAL | Status: DC
  Administered 2018-06-17 – 2018-06-19 (×5): 1 mg via ORAL
  Filled 2018-06-17 (×6): qty 1

## 2018-06-17 MED ORDER — DILTIAZEM HCL 25 MG/5ML IV SOLN
10.0000 mg | Freq: Once | INTRAVENOUS | Status: AC
  Administered 2018-06-17: 10 mg via INTRAVENOUS

## 2018-06-17 MED ORDER — MYCOPHENOLATE MOFETIL 250 MG PO CAPS
500.0000 mg | ORAL_CAPSULE | Freq: Every day | ORAL | Status: DC
  Administered 2018-06-18 – 2018-06-20 (×3): 500 mg via ORAL
  Filled 2018-06-17 (×4): qty 2

## 2018-06-17 MED ORDER — ACETAMINOPHEN 325 MG PO TABS
650.0000 mg | ORAL_TABLET | Freq: Four times a day (QID) | ORAL | Status: DC | PRN
  Administered 2018-06-18: 325 mg via ORAL
  Administered 2018-06-19: 650 mg via ORAL
  Administered 2018-06-19: 325 mg via ORAL
  Administered 2018-06-20: 650 mg via ORAL
  Filled 2018-06-17 (×4): qty 2

## 2018-06-17 MED ORDER — DILTIAZEM HCL 25 MG/5ML IV SOLN
INTRAVENOUS | Status: AC
  Filled 2018-06-17: qty 5

## 2018-06-17 MED ORDER — HEPARIN SODIUM (PORCINE) 5000 UNIT/ML IJ SOLN
5000.0000 [IU] | Freq: Three times a day (TID) | INTRAMUSCULAR | Status: DC
  Administered 2018-06-17 – 2018-06-20 (×9): 5000 [IU] via SUBCUTANEOUS
  Filled 2018-06-17 (×9): qty 1

## 2018-06-17 MED ORDER — BISACODYL 5 MG PO TBEC
5.0000 mg | DELAYED_RELEASE_TABLET | Freq: Two times a day (BID) | ORAL | Status: DC
  Administered 2018-06-17 – 2018-06-20 (×6): 5 mg via ORAL
  Filled 2018-06-17 (×6): qty 1

## 2018-06-17 MED ORDER — DILTIAZEM HCL 30 MG PO TABS
60.0000 mg | ORAL_TABLET | Freq: Three times a day (TID) | ORAL | Status: DC
  Administered 2018-06-17 – 2018-06-19 (×5): 60 mg via ORAL
  Filled 2018-06-17 (×5): qty 2

## 2018-06-17 MED ORDER — DILTIAZEM HCL 25 MG/5ML IV SOLN
5.0000 mg | Freq: Once | INTRAVENOUS | Status: AC
  Administered 2018-06-17: 5 mg via INTRAVENOUS
  Filled 2018-06-17: qty 5

## 2018-06-17 NOTE — ED Notes (Signed)
Patient transported to Ultrasound 

## 2018-06-17 NOTE — ED Notes (Signed)
ED TO INPATIENT HANDOFF REPORT  ED Nurse Name and Phone #:  Carney Harder, RN  S Name/Age/Gender Brenda Roman 85 y.o. female Room/Bed: ED16A/ED16A  Code Status   Code Status: DNR  Home/SNF/Other Home Patient oriented to: self Is this baseline? Yes   Triage Complete: Triage complete  Chief Complaint dizzy  Triage Note Patient from home via [facility]. Patient complaining of dizziness and nausea. Reports she was recently admitted for same. Prescribed prednisone and a zpac and has been taking them at home.    Allergies Allergies  Allergen Reactions  . Cephalexin Other (See Comments)    Reaction:  Unknown  . Trazodone     Other reaction(s): Other (See Comments) Bad Dreams   . Zaleplon     Other reaction(s): Other (See Comments) Weird Dreams  . Zolpidem     Other reaction(s): Other (See Comments) Memory, worried her to take it  . Novocain [Procaine] Rash  . Sulfa Antibiotics Rash    Level of Care/Admitting Diagnosis ED Disposition    ED Disposition Condition [facility] Area: [place] [100120]  Level of Care: Telemetry [5]  Diagnosis: Dizziness [326712]  Admitting Physician: Vaughan Basta [[id number]]  Attending Physician: Vaughan Basta [phone]  PT Class (Do Not Modify): Observation [104]  PT Acc Code (Do Not Modify): Observation [10022]       B Medical/Surgery History Past Medical History:  Diagnosis Date  . Aortic stenosis   . 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 Cancer ([place])    skin  . CHF (congestive heart failure) ([place])   . Coronary artery disease   . CVA (cerebral infarction) June '16  . Hypertension   . Liver transplanted ([place])   . Macular degeneration   . Mitral valve disease   . PBC (primary biliary cirrhosis)    s/p liver transplant  . Shingles   . TIA (transient ischemic attack)    Past Surgical History:  Procedure Laterality Date  . BOWEL RESECTION    . COLONOSCOPY    . CORONARY ARTERY BYPASS  GRAFT    . JOINT REPLACEMENT    . LIVER TRANSPLANT  1990     A IV Location/Drains/Wounds Patient Lines/Drains/Airways Status   Active Line/Drains/Airways    Name:   Placement date:   Placement time:   Site:   Days:   Peripheral IV 06/17/18 Right Arm   06/17/18    0850    Arm   less than 1          Intake/Output Last 24 hours No intake or output data in the 24 hours ending 06/17/18 1617  Labs/Imaging Results for orders placed or performed during the hospital encounter of 06/17/18 (from the past 48 hour(s))  CBC with Differential     Status: None   Collection Time: 06/17/18  8:47 AM  Result Value Ref Range   WBC 6.4 4.0 - 10.5 K/uL   RBC 4.04 3.87 - 5.11 MIL/uL   Hemoglobin 12.1 12.0 - 15.0 g/dL   HCT 37.3 36.0 - 46.0 %   MCV 92.3 80.0 - 100.0 fL   MCH 30.0 26.0 - 34.0 pg   MCHC 32.4 30.0 - 36.0 g/dL   RDW 12.1 11.5 - 15.5 %   Platelets 171 150 - 400 K/uL   nRBC 0.0 0.0 - 0.2 %   Neutrophils Relative % 55 %   Neutro Abs 3.5 1.7 - 7.7 K/uL   Lymphocytes Relative 30 %   Lymphs  Abs 1.9 0.7 - 4.0 K/uL   Monocytes Relative 10 %   Monocytes Absolute 0.6 0.1 - 1.0 K/uL   Eosinophils Relative 3 %   Eosinophils Absolute 0.2 0.0 - 0.5 K/uL   Basophils Relative 1 %   Basophils Absolute 0.1 0.0 - 0.1 K/uL   Immature Granulocytes 1 %   Abs Immature Granulocytes 0.04 0.00 - 0.07 K/uL    Comment: Performed at [facility], [address] [place], [place] [address]  Comprehensive metabolic panel     Status: Abnormal   Collection Time: 06/17/18  8:47 AM  Result Value Ref Range   Sodium 128 (L) 135 - 145 mmol/L   Potassium 4.4 3.5 - 5.1 mmol/L   Chloride 100 98 - 111 mmol/L   CO2 22 22 - 32 mmol/L   Glucose, Bld 92 70 - 99 mg/dL   BUN 17 8 - 23 mg/dL   Creatinine, Ser 0.74 0.44 - 1.00 mg/dL   Calcium 8.4 (L) 8.9 - 10.3 mg/dL   Total Protein 6.2 (L) 6.5 - 8.1 g/dL   Albumin 3.3 (L) 3.5 - 5.0 g/dL   AST 19 15 - 41 U/L   ALT 14 0 - 44 U/L   Alkaline Phosphatase 78 38  - 126 U/L   Total Bilirubin 0.7 0.3 - 1.2 mg/dL   GFR calc non Af Amer >60 >60 mL/min   GFR calc Af Amer >60 >60 mL/min   Anion gap 6 5 - 15    Comment: Performed at [facility], [address] [place], [place] [address]  Troponin I - ONCE - STAT     Status: Abnormal   Collection Time: 06/17/18  8:47 AM  Result Value Ref Range   Troponin I 0.18 (HH) <0.03 ng/mL    Comment: CRITICAL RESULT CALLED TO, READ BACK BY AND VERIFIED WITH YESSICA HAUAS 06/17/18 9675 KLW Performed at [facility], [address] [place], [place] [address]   Magnesium     Status: None   Collection Time: 06/17/18  8:47 AM  Result Value Ref Range   Magnesium 2.1 1.7 - 2.4 mg/dL    Comment: Performed at [facility], [address] [place], [place] [address]  TSH     Status: Abnormal   Collection Time: 06/17/18  8:47 AM  Result Value Ref Range   TSH 4.778 (H) 0.350 - 4.500 uIU/mL    Comment: Performed by a 3rd Generation assay with a functional sensitivity of <=0.01 uIU/mL. Performed at [facility], [address] [place], [place] [address]   Urinalysis, Complete w Microscopic     Status: Abnormal   Collection Time: 06/17/18 12:04 PM  Result Value Ref Range   Color, Urine YELLOW (A) YELLOW   APPearance HAZY (A) CLEAR   Specific Gravity, Urine 1.008 1.005 - 1.030   pH 5.0 5.0 - 8.0   Glucose, UA NEGATIVE NEGATIVE mg/dL   Hgb urine dipstick SMALL (A) NEGATIVE   Bilirubin Urine NEGATIVE NEGATIVE   Ketones, ur NEGATIVE NEGATIVE mg/dL   Protein, ur NEGATIVE NEGATIVE mg/dL   Nitrite NEGATIVE NEGATIVE   Leukocytes,Ua MODERATE (A) NEGATIVE   RBC / HPF 0-5 0 - 5 RBC/hpf   WBC, UA 0-5 0 - 5 WBC/hpf   Bacteria, UA RARE (A) NONE SEEN   Squamous Epithelial / LPF 0-5 0 - 5   Mucus PRESENT     Comment: Performed at [facility], [address] [place], [place] [address]  US Carotid Bilateral  Result Date: 06/17/2018 CLINICAL DATA:  CVA EXAM: BILATERAL  CAROTID DUPLEX ULTRASOUND TECHNIQUE: Pearline Cables scale imaging, color Doppler and duplex ultrasound were performed of bilateral carotid and vertebral arteries in the neck. COMPARISON:  None. FINDINGS: Criteria: Quantification of carotid stenosis is based on velocity parameters that correlate the residual internal carotid diameter with NASCET-based stenosis levels, using the diameter of the distal internal carotid lumen as the denominator for stenosis measurement. The following velocity measurements were obtained: RIGHT ICA: 35 cm/sec CCA: 43 cm/sec SYSTOLIC ICA/CCA RATIO:  0.8 ECA: 34 cm/sec LEFT ICA: 56 cm/sec CCA: 45 cm/sec SYSTOLIC ICA/CCA RATIO:  1.2 ECA: 35 cm/sec RIGHT CAROTID ARTERY: Moderate focal calcified plaque in the bulb. Low resistance internal carotid Doppler pattern. RIGHT VERTEBRAL ARTERY:  Antegrade. LEFT CAROTID ARTERY: There is moderate irregular calcified plaque in the bulb and lower internal carotid artery. Low resistance internal carotid Doppler pattern is preserved. LEFT VERTEBRAL ARTERY:  Antegrade. The rhythm is irregular and atrial fibrillation is not excluded. IMPRESSION: Less than 50% stenosis in the right and left internal carotid arteries. Irregular cardiac rhythm. Electronically Signed   By: Marybelle Killings M.D.   On: 06/17/2018 12:18    Pending Labs Unresulted Labs (From admission, onward)    Start     Ordered   06/18/18 2694  Basic metabolic panel  Tomorrow morning,   STAT     06/17/18 1410   06/18/18 0500  CBC  Tomorrow morning,   STAT     06/17/18 1410          Vitals/Pain Today's Vitals   06/17/18 1400 06/17/18 1435 06/17/18 1530 06/17/18 1600  BP: 99/66 (!) 120/92 110/89 121/67  Pulse:  (!) 108    Resp: 20  19 17   Temp:      TempSrc:      SpO2: 95%  92% 90%  Weight:      Height:      PainSc:        Isolation Precautions No active isolations  Medications Medications  metoprolol succinate (TOPROL-XL) 24 hr tablet 25 mg (25 mg Oral Given 06/17/18 1435)   nitroGLYCERIN (NITROSTAT) SL tablet 0.4 mg (has no administration in time range)  hydrOXYzine (ATARAX/VISTARIL) tablet 25 mg (has no administration in time range)  LORazepam (ATIVAN) tablet 1 mg (1 mg Oral Given 06/17/18 1436)  bisacodyl (DULCOLAX) EC tablet 5 mg (5 mg Oral Not Given 06/17/18 1501)  Melatonin TABS 1 mg (has no administration in time range)  mycophenolate (CELLCEPT) capsule 500 mg (has no administration in time range)  docusate sodium (COLACE) capsule 100 mg (has no administration in time range)  heparin injection 5,000 Units (5,000 Units Subcutaneous Given 06/17/18 1437)  ondansetron (ZOFRAN) injection 4 mg (4 mg Intravenous Given 06/17/18 0903)  diltiazem (CARDIZEM) injection 5 mg (5 mg Intravenous Given 06/17/18 0943)  sodium chloride 0.9 % bolus 250 mL (250 mLs Intravenous New Bag/Given 06/17/18 8546)    Mobility walks with device High fall risk   Focused Assessments Cardiac Assessment Handoff:  Cardiac Rhythm: Atrial fibrillation, Bundle branch block(LBBB) Lab Results  Component Value Date   CKTOTAL 71 12/26/2012   CKMB 2.2 12/26/2012   TROPONINI 0.18 ([place]) 06/17/2018   No results found for: DDIMER Does the Patient currently have chest pain? No     R Recommendations: See Admitting Provider Note  Report given to:   Additional Notes:  Pt lives at home with husband. Swallows pills well. Husband states "she gets up when she  wants to" when asked about patient's ambulation

## 2018-06-17 NOTE — Consult Note (Signed)
[facility] Consultation Note  Patient ID: Brenda Roman, MRN: [id number], DOB/AGE: Feb 23, 1924 85 y.o. Admit date: 06/17/2018   Date of Consult: 06/17/2018 Primary Physician: Tracie Harrier, MD Primary Cardiologist: None  Chief Complaint:  Chief Complaint  Patient presents with  . Dizziness  . Nausea   Reason for Consult: Atrial fibrillation  HPI: 85 y.o. female with known coronary atherosclerosis previous stroke mild aortic valve stenosis essential hypertension mixed hyperlipidemia and cardiomyopathy with ejection fraction of 30% who was recently admitted to the hospital for atrial fibrillation with rapid ventricular rate.  The patient had appropriate medication management at that time and was discharged.  It is unclear as to the primary reason for further concerns but the patient did have shortness of breath and some dizziness.  With this dizziness it does appear that she had significant atrial fibrillation with rapid ventricular rate.  Other times her heart rate was at 80 bpm.  The patient currently has received extra doses of metoprolol with better heart rate control with atrial fibrillation with heart rate control of 86 bpm at this time.  There is been no evidence of heart failure at this time and no evidence of angina.  Currently she is hemodynamically stable  Past Medical History:  Diagnosis Date  . Aortic stenosis   . 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 Cancer ([place])    skin  . CHF (congestive heart failure) ([place])   . Coronary artery disease   . CVA (cerebral infarction) June '16  . Hypertension   . Liver transplanted ([place])   . Macular degeneration   . Mitral valve disease   . PBC (primary biliary cirrhosis)    s/p liver transplant  . Shingles   . TIA (transient ischemic attack)       Surgical History:  Past Surgical History:  Procedure Laterality Date  . BOWEL RESECTION    . COLONOSCOPY    . CORONARY ARTERY BYPASS GRAFT    . JOINT REPLACEMENT    . LIVER  TRANSPLANT  1990     Home Meds: Prior to Admission medications   Medication Sig Start Date End Date Taking? Authorizing Provider  bisacodyl (DULCOLAX) 5 MG EC tablet Take 5 mg by mouth 2 (two) times daily.   Yes [provider]  LORazepam (ATIVAN) 2 MG tablet Take 1 mg by mouth 2 (two) times daily. Early evening and at about 2300   Yes [provider]  metoprolol succinate (TOPROL-XL) 50 MG 24 hr tablet Take 50 mg by mouth daily. Take with or immediately following a meal.    Yes [provider]  mycophenolate (CELLCEPT) 500 MG tablet Take 500 mg by mouth daily.    Yes [provider]  hydrOXYzine (ATARAX/VISTARIL) 25 MG tablet Take 25 mg by mouth 3 (three) times daily as needed for itching.  02/19/18   [provider]  Melatonin 1 MG TABS Take 1 mg by mouth at bedtime as needed (sleep).    [provider]  nitroGLYCERIN (NITROSTAT) 0.4 MG SL tablet Place 1 tablet under the tongue as needed. Reported on 10/26/2015 06/20/15   [provider]    Inpatient Medications:  . bisacodyl  5 mg Oral BID  . heparin  5,000 Units Subcutaneous Q8H  . LORazepam  1 mg Oral BID  . metoprolol succinate  50 mg Oral Daily  . mycophenolate  500 mg Oral Daily     Allergies:  Allergies  Allergen Reactions  . Cephalexin  Other (See Comments)    Reaction:  Unknown  . Trazodone     Other reaction(s): Other (See Comments) Bad Dreams   . Zaleplon     Other reaction(s): Other (See Comments) Weird Dreams  . Zolpidem     Other reaction(s): Other (See Comments) Memory, worried her to take it  . Novocain [Procaine] Rash  . Sulfa Antibiotics Rash    Social History   Socioeconomic History  . Marital status: Married    Spouse name: Not on file  . Number of children: Not on file  . Years of education: Not on file  . Highest education level: Not on file  Occupational History  . Not on file  Social Needs  . Financial resource strain: Not on file   . Food insecurity:    Worry: Not on file    Inability: Not on file  . Transportation needs:    Medical: Not on file    Non-medical: Not on file  Tobacco Use  . Smoking status: Former Research scientist (life sciences)  . Smokeless tobacco: Never Used  Substance and Sexual Activity  . Alcohol use: No  . Drug use: No  . Sexual activity: Never  Lifestyle  . Physical activity:    Days per week: Not on file    Minutes per session: Not on file  . Stress: Not on file  Relationships  . Social connections:    Talks on phone: Not on file    Gets together: Not on file    Attends religious service: Not on file    Active member of club or organization: Not on file    Attends meetings of clubs or organizations: Not on file    Relationship status: Not on file  . Intimate partner violence:    Fear of current or ex partner: Not on file    Emotionally abused: Not on file    Physically abused: Not on file    Forced sexual activity: Not on file  Other Topics Concern  . Not on file  Social History Narrative  . Not on file     Family History  Problem Relation Age of Onset  . Hypertension Father   . Heart attack Father   . Stroke Mother      Review of Systems Positive for shortness of breath dizziness palpitations Negative for: General:  chills, fever, night sweats or weight changes.  Cardiovascular: PND orthopnea syncope dizziness  Dermatological skin lesions rashes Respiratory: Cough congestion Urologic: Frequent urination urination at night and hematuria Abdominal: negative for nausea, vomiting, diarrhea, bright red blood per rectum, melena, or hematemesis Neurologic: negative for visual changes, and/or hearing changes  All other systems reviewed and are otherwise negative except as noted above.  Labs: Recent Labs    06/17/18 0847  TROPONINI 0.18*   Lab Results  Component Value Date   WBC 6.4 06/17/2018   HGB 12.1 06/17/2018   HCT 37.3 06/17/2018   MCV 92.3 06/17/2018   PLT 171 06/17/2018     Recent Labs  Lab 06/17/18 0847  NA 128*  K 4.4  CL 100  CO2 22  BUN 17  CREATININE 0.74  CALCIUM 8.4*  PROT 6.2*  BILITOT 0.7  ALKPHOS 78  ALT 14  AST 19  GLUCOSE 92   Lab Results  Component Value Date   CHOL 132 09/07/2014   HDL 25 (L) 09/07/2014   LDLCALC 80 09/07/2014   TRIG 134 09/07/2014   No results found for: DDIMER  Radiology/Studies:  Dg Chest 2 View  Result Date: 06/14/2018 CLINICAL DATA:  Pain all over. Possible pneumonia. EXAM: CHEST - 2 VIEW COMPARISON:  05/23/2018 FINDINGS: Enlarged heart. Calcific atherosclerotic disease and tortuosity of the aorta. Stable postsurgical changes. Mild pulmonary vascular congestion Right pleural effusion. Stable compression deformities of the lower thoracic vertebral bodies. IMPRESSION: 1. Cardiomegaly with mild pulmonary vascular congestion. 2. Right pleural effusion. Electronically Signed   By: Fidela Salisbury M.D.   On: 06/14/2018 16:35   Ct Chest W Contrast  Result Date: 06/14/2018 CLINICAL DATA:  Altered mental status.  Lethargy. EXAM: CT CHEST WITH CONTRAST TECHNIQUE: Multidetector CT imaging of the chest was performed during intravenous contrast administration. CONTRAST:  19mL OMNIPAQUE IOHEXOL 300 MG/ML  SOLN COMPARISON:  Chest x-ray from same day. CT chest dated January 30, 2017. FINDINGS: Cardiovascular: Stable cardiomegaly with prominent left atrial enlargement. No thoracic aortic aneurysm or dissection. Coronary, aortic arch, and branch vessel atherosclerotic vascular disease. No pulmonary embolism. Stable moderate dilatation of the left and right pulmonary arteries. Mediastinum/Nodes: No enlarged mediastinal, hilar, or axillary lymph nodes. Thyroid gland, trachea, and esophagus demonstrate no significant findings. Lungs/Pleura: Small right pleural effusion. Small amount of loculated pleural fluid along the superior right major fissure. Associated subsegmental atelectasis in the right lower lobe. Unchanged scarring at  the lung bases. No consolidation or pneumothorax. Interval mild enlargement of a pleural based nodule along the superior left major fissure, now measuring 6 x 10 mm, previously 4 x 7 mm. Upper Abdomen: No acute abnormality. Unchanged pneumobilia and left renal cyst. Musculoskeletal: No chest wall abnormality. No acute or significant osseous findings. Unchanged chronic thoracic compression deformities. Unchanged healed bilateral posterior rib fractures. IMPRESSION: 1. Small right pleural effusion with adjacent right lower lobe atelectasis. No evidence of pneumonia. 2. Slight interval increase in size of a pleural-based nodule along the superior left major fissure, now measuring 8 mm in aggregate size, previously 6 mm. Non-contrast chest CT at 6-12 months is recommended. If the nodule is stable at time of repeat CT, then future CT at 18-24 months (from today's scan) is considered optional for low-risk patients, but is recommended for high-risk patients. This recommendation follows the consensus statement: Guidelines for Management of Incidental Pulmonary Nodules Detected on CT Images: From the Fleischner Society 2017; Radiology 2017; 284:228-243. 3. Stable pulmonary artery enlargement, suggestive of pulmonary arterial hypertension. 4.  Aortic atherosclerosis (ICD10-I70.0). Electronically Signed   By: Titus Dubin M.D.   On: 06/14/2018 18:03   US Carotid Bilateral  Result Date: 06/17/2018 CLINICAL DATA:  CVA EXAM: BILATERAL CAROTID DUPLEX ULTRASOUND TECHNIQUE: Pearline Cables scale imaging, color Doppler and duplex ultrasound were performed of bilateral carotid and vertebral arteries in the neck. COMPARISON:  None. FINDINGS: Criteria: Quantification of carotid stenosis is based on velocity parameters that correlate the residual internal carotid diameter with NASCET-based stenosis levels, using the diameter of the distal internal carotid lumen as the denominator for stenosis measurement. The following velocity measurements  were obtained: RIGHT ICA: 35 cm/sec CCA: 43 cm/sec SYSTOLIC ICA/CCA RATIO:  0.8 ECA: 34 cm/sec LEFT ICA: 56 cm/sec CCA: 45 cm/sec SYSTOLIC ICA/CCA RATIO:  1.2 ECA: 35 cm/sec RIGHT CAROTID ARTERY: Moderate focal calcified plaque in the bulb. Low resistance internal carotid Doppler pattern. RIGHT VERTEBRAL ARTERY:  Antegrade. LEFT CAROTID ARTERY: There is moderate irregular calcified plaque in the bulb and lower internal carotid artery. Low resistance internal carotid Doppler pattern is preserved. LEFT VERTEBRAL ARTERY:  Antegrade. The rhythm is irregular and atrial fibrillation is not excluded. IMPRESSION: Less  than 50% stenosis in the right and left internal carotid arteries. Irregular cardiac rhythm. Electronically Signed   By: Marybelle Killings M.D.   On: 06/17/2018 12:18   Dg Chest Portable 1 View  Result Date: 05/23/2018 CLINICAL DATA:  Weakness and not feeling well associated with dizziness for 2 days. Weakness for 2 months. EXAM: PORTABLE CHEST 1 VIEW COMPARISON:  03/04/2018 and multiple earlier exams. FINDINGS: There are changes from CABG surgery. Cardiac silhouette is mildly enlarged. No mediastinal or hilar masses. There are prominent bronchovascular markings thickened interstitial markings, as well as chronic areas of reticular/linear scarring. There is additional airspace opacity in the right lateral mid to lower lung and opacity of both lung bases consistent with small effusions. No pneumothorax. Skeletal structures are grossly intact. IMPRESSION: 1. Findings consistent with mild congestive heart failure superimposed on chronic interstitial thickening. 2. Mild stable cardiomegaly and stable changes from prior CABG surgery. 3. Peripheral airspace opacity in the right mid to lower lung represents a change from prior studies. This may be due to atelectasis or focal edema or a small area of loculated fluid. Pneumonia is possible. Electronically Signed   By: Lajean Manes M.D.   On: 05/23/2018 16:24    EKG:  Atrial fibrillation with rapid ventricular rate nonspecific ST and T wave changes  Weights: Filed Weights   06/17/18 0845  Weight: 50 kg     Physical Exam: Blood pressure 118/77, pulse (!) 53, temperature 97.6 F (36.4 C), temperature source Oral, resp. rate (!) 21, height 5\' 1"  (1.549 m), weight 50 kg, SpO2 96 %. Body mass index is 20.83 kg/m. General: Well developed, well nourished, in no acute distress. Head eyes ears nose throat: Normocephalic, atraumatic, sclera non-icteric, no xanthomas, nares are without discharge. No apparent thyromegaly and/or mass  Lungs: Normal respiratory effort.  no wheezes, no rales, few rhonchi.  Heart: Irregular with normal S1 S2. no murmur gallop, no rub, PMI is normal size and placement, carotid upstroke normal without bruit, jugular venous pressure is normal Abdomen: Soft, non-tender, non-distended with normoactive bowel sounds. No hepatomegaly. No rebound/guarding. No obvious abdominal masses. Abdominal aorta is normal size without bruit Extremities: Trace edema. no cyanosis, no clubbing, no ulcers  Peripheral : 2+ bilateral upper extremity pulses, 2+ bilateral femoral pulses, 2+ bilateral dorsal pedal pulse Neuro: Alert and oriented. No facial asymmetry. No focal deficit. Moves all extremities spontaneously. Musculoskeletal: Normal muscle tone with kyphosis Psych:  Responds to questions appropriately with a normal affect.    Assessment: 85 year old female with dilated cardiomyopathy essential hypertension mixed hyperlipidemia with atrial fibrillation with rapid ventricular rate need further medication management  Plan: 1.  Increase metoprolol to 50 mg each day for better heart rate control and following for need for additional medication management dosage including possibility 75 mg/day.  Goal heart rate will be between 60 and 90 bpm on average 2.  Continue metoprolol for cardiomyopathy as per above 3.  No additional anticoagulation at this time  due to concerns of significant bleeding complications 4.  No further cardiac diagnostics necessary at this time  Signed, Corey Skains M.D. [facility] 06/17/2018, 6:34 PM

## 2018-06-17 NOTE — ED Notes (Signed)
Patient moved from ED to stretcher to hospital bed and assisted to use bedpan. After patient taken off of bedpan, cardiac monitor showed patient with sustained HR in 150s. ECG captured. Dr. Anselm Jungling notified and order for 10 mg Diltiazem placed.

## 2018-06-17 NOTE — ED Notes (Signed)
Date and time results received: 06/17/18 9:21 AM  (use smartphrase ".now" to insert current time)  Test: troponin Critical Value: 0.18  Name of Provider Notified: Jimmye Norman, MD   Orders Received? Or Actions Taken?: Actions Taken: MD aware

## 2018-06-17 NOTE — ED Notes (Signed)
Patient given lunch tray. Sitting up in bed eating at this time. Husband at bedside.

## 2018-06-17 NOTE — ED Triage Notes (Signed)
Patient from home via [facility]. Patient complaining of dizziness and nausea. Reports she was recently admitted for same. Prescribed prednisone and a zpac and has been taking them at home.

## 2018-06-17 NOTE — ED Notes (Signed)
RN Georgina Peer, notified of patient's improvement. Patient transported to 252 via hospital bed.

## 2018-06-17 NOTE — ED Notes (Signed)
Patient returned from US.

## 2018-06-17 NOTE — ED Provider Notes (Signed)
[facility] Emergency Department Provider Note       Time seen: ----------------------------------------- 8:43 AM on 06/17/2018 -----------------------------------------   I have reviewed the triage vital signs and the nursing notes.  HISTORY   Chief Complaint Dizziness and Nausea    HPI Brenda Roman is a 85 y.o. female with a history of aortic stenosis, CHF, coronary disease, CVA, blindness, shingles, TIA who presents to the ED for dizziness and nausea.  Patient reports reports she was recently seen for same.  EMS states the cause could not be discovered.  She is on prednisone and a Z-Pak for respiratory infection.  She denies other complaints at this time.  Past Medical History:  Diagnosis Date  . Aortic stenosis   . 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 Cancer ([place])    skin  . CHF (congestive heart failure) ([place])   . Coronary artery disease   . CVA (cerebral infarction) June '16  . Hypertension   . Liver transplanted ([place])   . Macular degeneration   . Mitral valve disease   . PBC (primary biliary cirrhosis)    s/p liver transplant  . Shingles   . TIA (transient ischemic attack)     Patient Active Problem List   Diagnosis Date Noted  . Atrial fibrillation with RVR ([place]) 06/14/2018  . Wheelchair bound 02/05/2018  . Primary insomnia 12/04/2017  . Atrial fibrillation ([place]) 06/06/2017  . Chest pain 06/05/2017  . Dizziness 04/23/2017  . Diarrhea 04/23/2017  . UTI (urinary tract infection) 04/23/2017  . Acute diverticulitis 11/23/2016  . Arrhythmia, ventricular 09/19/2015  . Neuropathy 09/06/2015  . Hyponatremia 07/30/2015  . Hypo-osmolality and hyponatremia 07/30/2015  . Acute CHF (congestive heart failure) ([place]) 07/18/2015  . Mitral valve stenosis, moderate 06/20/2015  . 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 Closed wedge fracture of lumbar vertebra ([place]) 10/11/2014  . Ischemic stroke ([place]) 10/07/2014  . Acute lower UTI 10/07/2014   . 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 History of stroke 10/07/2014  . Recurrent UTI 10/07/2014  . Closed fracture of humerus, surgical neck 09/29/2014  . Goals of care, counseling/discussion   . Acute encephalopathy 08/29/2014  . Hypoxia 08/29/2014  . Healthcare-associated pneumonia 08/29/2014  . Pneumonia 08/29/2014  . CVA (cerebral infarction) 08/23/2014  . Arteriosclerosis of coronary artery 03/15/2014  . Paroxysmal atrial fibrillation ([place]) 03/15/2014  . Atherosclerosis of abdominal aorta ([place]) 01/27/2014  . HLD (hyperlipidemia) 04/07/2012  . BP (high blood pressure) 04/07/2012  . History of liver transplant ([place]) 04/03/2012  . Encounter for other procedures for purposes other than remedying health state 04/03/2012  . Immunosuppression ([place]) 04/03/2012  . Prolapse of urethra 03/10/2012  . Pelvic relaxation due to uterovaginal prolapse 03/10/2012    Past Surgical History:  Procedure Laterality Date  . BOWEL RESECTION    . COLONOSCOPY    . CORONARY ARTERY BYPASS GRAFT    . JOINT REPLACEMENT    . LIVER TRANSPLANT  1990    Allergies Cephalexin; Trazodone; Zaleplon; Zolpidem; Novocain [procaine]; and Sulfa antibiotics  Social History Social History   Tobacco Use  . Smoking status: Former Research scientist (life sciences)  . Smokeless tobacco: Never Used  Substance Use Topics  . Alcohol use: No  . Drug use: No   Review of Systems Constitutional: Negative for fever. Cardiovascular: Negative for chest pain. Respiratory: Negative for shortness of breath. Gastrointestinal: Negative for abdominal pain, positive for nausea and vomiting Genitourinary: Negative for dysuria. Musculoskeletal: Negative for back pain. Skin: Negative for rash.  Neurological: Positive for dizziness  All systems negative/normal/unremarkable except as stated in the HPI  ____________________________________________   PHYSICAL EXAM:  VITAL SIGNS: ED Triage Vitals  Enc Vitals Group     BP      Pulse      Resp      Temp       Temp src      SpO2      Weight      Height      Head Circumference      Peak Flow      Pain Score      Pain Loc      Pain Edu?      Excl. in [place]?    Constitutional: Alert and oriented. Well appearing and in no distress. ENT      Head: Normocephalic and atraumatic.      Nose: No congestion/rhinnorhea.      Mouth/Throat: Mucous membranes are moist.      Neck: No stridor. Cardiovascular: Irregularly irregular rhythm. No murmurs, rubs, or gallops. Respiratory: Normal respiratory effort without tachypnea nor retractions. Breath sounds are clear and equal bilaterally. No wheezes/rales/rhonchi. Gastrointestinal: Soft and nontender. Normal bowel sounds Musculoskeletal: Nontender with normal range of motion in extremities. No lower extremity tenderness nor edema. Neurologic:  Normal speech and language. No gross focal neurologic deficits are appreciated.  Skin:  Skin is warm, dry and intact. No rash noted. Psychiatric: Mood and affect are normal. Speech and behavior are normal.  ____________________________________________  EKG: Interpreted by me.  Atrial fibrillation with a rate of 102 bpm, left bundle branch block, normal QT  ____________________________________________  ED COURSE:  As part of my medical decision making, I reviewed the following data within the [place] History obtained from family if available, nursing notes, old chart and ekg, as well as notes from prior ED visits. Patient presented for dizziness and nausea, we will assess with labs and imaging as indicated at this time.   Procedures ____________________________________________   LABS (pertinent positives/negatives)  Labs Reviewed  COMPREHENSIVE METABOLIC PANEL - Abnormal; Notable for the following components:      Result Value   Sodium 128 (*)    Calcium 8.4 (*)    Total Protein 6.2 (*)    Albumin 3.3 (*)    All other components within normal limits  TROPONIN I - Abnormal; Notable  for the following components:   Troponin I 0.18 (*)    All other components within normal limits  CBC WITH DIFFERENTIAL/PLATELET  URINALYSIS, COMPLETE (UACMP) WITH MICROSCOPIC  CBG MONITORING, ED  ____________________________________________   DIFFERENTIAL DIAGNOSIS   Dehydration, electrolyte abnormality, arrhythmia, MI, occult infection, vertigo  FINAL ASSESSMENT AND PLAN  Dizziness, nausea, atrial fibrillation, elevated troponin   Plan: The patient had presented for dizziness and nausea. Patient's labs did indicate an elevated troponin of 0.18 and a low sodium at 128.  We have started gently giving fluids and I have given some IV Cardizem for rate control.  She does not require any imaging during this visit.  Patient was just discharged for similar complaints uncertain etiology.  I will discuss with the hospitalist for admission.   Laurence Aly, MD    Note: This note was generated in part or whole with voice recognition software. Voice recognition is usually quite accurate but there are transcription errors that can and very often do occur. I apologize for any typographical errors that were not detected and corrected.     Lenise Arena  E, MD 06/17/18 1030

## 2018-06-17 NOTE — Progress Notes (Signed)
Family Meeting Note  Advance Directive:yes  Today a meeting took place with the Patient and spouse.   The following clinical team members were present during this meeting:MD  The following were discussed:Patient's diagnosis: A fib RVR, Htn, Liver transplant , Patient's progosis: Unable to determine and Goals for treatment: DNR  Additional follow-up to be provided: Cardiologist  Time spent during discussion:20 minutes  Vaughan Basta, MD

## 2018-06-17 NOTE — Consult Note (Addendum)
[facility]  CARDIOLOGY CONSULT NOTE  Patient ID: Brenda Roman MRN: [id number] DOB/AGE: [birth year] 85 y.o.  Admit date: 06/17/2018 Referring Physician: Dr. Anselm Jungling Primary Physician: Dr. Ginette Pitman Primary Cardiologist: Dr. Ubaldo Glassing Reason for Consultation: A-fib with RVR  HPI:  Brenda Roman is a 85 y.o. with a past medical history of CAD with MI s/p CABG in 2000, moderate mitral stenosis, severe aortic valve stenosis, prior stroke with residual right sided deficits, primary biliary cholangitis s/p liver transplant in 1991, who presented to the ED today for dizziness. She has been very generally weak for several days. She saw her PCP last week and was prescribed Zithromax and prednisone taper. Two days after starting the prednisone taper she begun reporting lightheadedness. Her husband took her pulse and found her heart rate to be in the 160s. This led to her being admitted for heart rate control in the setting of Afib with RVR. She was admitted overnight and then discharged home on 02/23. Since returning home two days ago she has still felt very weak. Today her husband noticed her heart rate was elevated again, prompting him to call 911. She denies having any associated chest pain or shortness of breath, however, history is limited from the patient.   Review of systems complete and found to be negative unless listed above   Past Medical History:  Diagnosis Date  . Aortic stenosis   . 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 Cancer ([place])    skin  . CHF (congestive heart failure) ([place])   . Coronary artery disease   . CVA (cerebral infarction) June '16  . Hypertension   . Liver transplanted ([place])   . Macular degeneration   . Mitral valve disease   . PBC (primary biliary cirrhosis)    s/p liver transplant  . Shingles   . TIA (transient ischemic attack)     Past Surgical History:  Procedure Laterality Date  . BOWEL RESECTION    . COLONOSCOPY    . CORONARY ARTERY BYPASS GRAFT    . JOINT  REPLACEMENT    . LIVER TRANSPLANT  1990    (Not in a hospital admission)  Social History   Socioeconomic History  . Marital status: Married    Spouse name: Not on file  . Number of children: Not on file  . Years of education: Not on file  . Highest education level: Not on file  Occupational History  . Not on file  Social Needs  . Financial resource strain: Not on file  . Food insecurity:    Worry: Not on file    Inability: Not on file  . Transportation needs:    Medical: Not on file    Non-medical: Not on file  Tobacco Use  . Smoking status: Former Research scientist (life sciences)  . Smokeless tobacco: Never Used  Substance and Sexual Activity  . Alcohol use: No  . Drug use: No  . Sexual activity: Never  Lifestyle  . Physical activity:    Days per week: Not on file    Minutes per session: Not on file  . Stress: Not on file  Relationships  . Social connections:    Talks on phone: Not on file    Gets together: Not on file    Attends religious service: Not on file    Active member of club or organization: Not on file    Attends meetings of clubs or organizations: Not on file    Relationship status: Not on file  .  Intimate partner violence:    Fear of current or ex partner: Not on file    Emotionally abused: Not on file    Physically abused: Not on file    Forced sexual activity: Not on file  Other Topics Concern  . Not on file  Social History Narrative  . Not on file    Family History  Problem Relation Age of Onset  . Hypertension Father   . Heart attack Father   . Stroke Mother      Review of systems complete and found to be negative unless listed above   PHYSICAL EXAM  General: Thin, frail appearing. Blind. In no acute distress HEENT:  Normocephalic and atraumatic.  Neck:  No JVD.  Lungs: Clear bilaterally to auscultation anteriorly  Heart: Irregularly irregular rhythm with rapid rate to 120s. Difficult to assess for murmurs based on rapid rate.  Abdomen: Abdomen soft and  non-tender  Extremities: No clubbing, cyanosis or edema.   Neuro: Alert and oriented X 3. Psych:  Calm affect.   Labs:   Lab Results  Component Value Date   WBC 6.4 06/17/2018   HGB 12.1 06/17/2018   HCT 37.3 06/17/2018   MCV 92.3 06/17/2018   PLT 171 06/17/2018    Recent Labs  Lab 06/17/18 0847  NA 128*  K 4.4  CL 100  CO2 22  BUN 17  CREATININE 0.74  CALCIUM 8.4*  PROT 6.2*  BILITOT 0.7  ALKPHOS 78  ALT 14  AST 19  GLUCOSE 92   Lab Results  Component Value Date   CKTOTAL 71 12/26/2012   CKMB 2.2 12/26/2012   TROPONINI 0.18 (HH) 06/17/2018    Lab Results  Component Value Date   CHOL 132 09/07/2014   CHOL 157 08/23/2014   CHOL 150 12/27/2012   Lab Results  Component Value Date   HDL 25 (L) 09/07/2014   HDL 30 (L) 08/23/2014   HDL 44 12/27/2012   Lab Results  Component Value Date   LDLCALC 80 09/07/2014   LDLCALC 107 (H) 08/23/2014   LDLCALC 80 12/27/2012   Lab Results  Component Value Date   TRIG 134 09/07/2014   TRIG 99 08/23/2014   TRIG 129 12/27/2012   Lab Results  Component Value Date   CHOLHDL 5.3 09/07/2014   CHOLHDL 5.2 08/23/2014   No results found for: LDLDIRECT    Radiology: Dg Chest 2 View  Result Date: 06/14/2018 CLINICAL DATA:  Pain all over. Possible pneumonia. EXAM: CHEST - 2 VIEW COMPARISON:  05/23/2018 FINDINGS: Enlarged heart. Calcific atherosclerotic disease and tortuosity of the aorta. Stable postsurgical changes. Mild pulmonary vascular congestion Right pleural effusion. Stable compression deformities of the lower thoracic vertebral bodies. IMPRESSION: 1. Cardiomegaly with mild pulmonary vascular congestion. 2. Right pleural effusion. Electronically Signed   By: Fidela Salisbury M.D.   On: 06/14/2018 16:35   Ct Chest W Contrast  Result Date: 06/14/2018 CLINICAL DATA:  Altered mental status.  Lethargy. EXAM: CT CHEST WITH CONTRAST TECHNIQUE: Multidetector CT imaging of the chest was performed during intravenous contrast  administration. CONTRAST:  58mL OMNIPAQUE IOHEXOL 300 MG/ML  SOLN COMPARISON:  Chest x-ray from same day. CT chest dated January 30, 2017. FINDINGS: Cardiovascular: Stable cardiomegaly with prominent left atrial enlargement. No thoracic aortic aneurysm or dissection. Coronary, aortic arch, and branch vessel atherosclerotic vascular disease. No pulmonary embolism. Stable moderate dilatation of the left and right pulmonary arteries. Mediastinum/Nodes: No enlarged mediastinal, hilar, or axillary lymph nodes. Thyroid gland, trachea, and esophagus  demonstrate no significant findings. Lungs/Pleura: Small right pleural effusion. Small amount of loculated pleural fluid along the superior right major fissure. Associated subsegmental atelectasis in the right lower lobe. Unchanged scarring at the lung bases. No consolidation or pneumothorax. Interval mild enlargement of a pleural based nodule along the superior left major fissure, now measuring 6 x 10 mm, previously 4 x 7 mm. Upper Abdomen: No acute abnormality. Unchanged pneumobilia and left renal cyst. Musculoskeletal: No chest wall abnormality. No acute or significant osseous findings. Unchanged chronic thoracic compression deformities. Unchanged healed bilateral posterior rib fractures. IMPRESSION: 1. Small right pleural effusion with adjacent right lower lobe atelectasis. No evidence of pneumonia. 2. Slight interval increase in size of a pleural-based nodule along the superior left major fissure, now measuring 8 mm in aggregate size, previously 6 mm. Non-contrast chest CT at 6-12 months is recommended. If the nodule is stable at time of repeat CT, then future CT at 18-24 months (from today's scan) is considered optional for low-risk patients, but is recommended for high-risk patients. This recommendation follows the consensus statement: Guidelines for Management of Incidental Pulmonary Nodules Detected on CT Images: From the Fleischner Society 2017; Radiology 2017;  284:228-243. 3. Stable pulmonary artery enlargement, suggestive of pulmonary arterial hypertension. 4.  Aortic atherosclerosis (ICD10-I70.0). Electronically Signed   By: Titus Dubin M.D.   On: 06/14/2018 18:03   US Carotid Bilateral  Result Date: 06/17/2018 CLINICAL DATA:  CVA EXAM: BILATERAL CAROTID DUPLEX ULTRASOUND TECHNIQUE: Pearline Cables scale imaging, color Doppler and duplex ultrasound were performed of bilateral carotid and vertebral arteries in the neck. COMPARISON:  None. FINDINGS: Criteria: Quantification of carotid stenosis is based on velocity parameters that correlate the residual internal carotid diameter with NASCET-based stenosis levels, using the diameter of the distal internal carotid lumen as the denominator for stenosis measurement. The following velocity measurements were obtained: RIGHT ICA: 35 cm/sec CCA: 43 cm/sec SYSTOLIC ICA/CCA RATIO:  0.8 ECA: 34 cm/sec LEFT ICA: 56 cm/sec CCA: 45 cm/sec SYSTOLIC ICA/CCA RATIO:  1.2 ECA: 35 cm/sec RIGHT CAROTID ARTERY: Moderate focal calcified plaque in the bulb. Low resistance internal carotid Doppler pattern. RIGHT VERTEBRAL ARTERY:  Antegrade. LEFT CAROTID ARTERY: There is moderate irregular calcified plaque in the bulb and lower internal carotid artery. Low resistance internal carotid Doppler pattern is preserved. LEFT VERTEBRAL ARTERY:  Antegrade. The rhythm is irregular and atrial fibrillation is not excluded. IMPRESSION: Less than 50% stenosis in the right and left internal carotid arteries. Irregular cardiac rhythm. Electronically Signed   By: Marybelle Killings M.D.   On: 06/17/2018 12:18   Dg Chest Portable 1 View  Result Date: 05/23/2018 CLINICAL DATA:  Weakness and not feeling well associated with dizziness for 2 days. Weakness for 2 months. EXAM: PORTABLE CHEST 1 VIEW COMPARISON:  03/04/2018 and multiple earlier exams. FINDINGS: There are changes from CABG surgery. Cardiac silhouette is mildly enlarged. No mediastinal or hilar masses. There are  prominent bronchovascular markings thickened interstitial markings, as well as chronic areas of reticular/linear scarring. There is additional airspace opacity in the right lateral mid to lower lung and opacity of both lung bases consistent with small effusions. No pneumothorax. Skeletal structures are grossly intact. IMPRESSION: 1. Findings consistent with mild congestive heart failure superimposed on chronic interstitial thickening. 2. Mild stable cardiomegaly and stable changes from prior CABG surgery. 3. Peripheral airspace opacity in the right mid to lower lung represents a change from prior studies. This may be due to atelectasis or focal edema or a small area of loculated  fluid. Pneumonia is possible. Electronically Signed   By: Lajean Manes M.D.   On: 05/23/2018 16:24    ASSESSMENT AND PLAN:   1. Atrial fibrillation with RVR:  Heart rate varies from 90 to 150s. Currently on metoprolol succinate 50 mg at home.  - Recommend increasing metoprolol succinate to 100 mg daily - Will reassess heart rate in the morning. Will consider addition of diltiazem if heart rate is still not <110.  - Anticoagulation previously contraindicated due to bleeding. Patient's family is hesitant about restarting anticoagulation. Will discuss further with patient's husband tomorrow, and make decision for long term anticoagulation at time of discharge. - No indication for further cardiac diagnostics at this time   2. Elevated troponin:  Initial troponin elevated to 0.18. Likely demand ischemia due to rapid rate.  - Will trend troponins.   3. Aortic and mitral stenosis:  - Will follow, however, no further intervention indicated at this time.    The patient's history and exam findings were discussed with Dr. Nehemiah Massed. The plan was made in conjunction with Dr. Nehemiah Massed.  Agree with above  Serafina Royals Signed: Hilbert Odor PA-C 06/17/2018, 3:27 PM

## 2018-06-17 NOTE — H&P (Signed)
[facility] [place] NAME: Brenda Roman    MR#:  [id number]  DATE OF BIRTH:  [birth year]  DATE OF ADMISSION:  06/17/2018  PRIMARY CARE PHYSICIAN: Tracie Harrier, MD   REQUESTING/REFERRING PHYSICIAN: [email or web address]  CHIEF COMPLAINT:   Chief Complaint  Patient presents with  . Dizziness  . Nausea    HISTORY OF PRESENT ILLNESS: Brenda Roman  is a 85 y.o. female with a known history of aortic stenosis, blind due to shingles, skin cancer, CHF, coronary artery disease, cerebrovascular accident, hypertension, liver transplant, primary biliary cirrhosis, TIA-was admitted to hospital for atrial fibrillation with RVR, and was advised to take prednisone and azithromycin for her respiratory issues recently. During her admission her atrial fibrillation resolved and heart rate was under control so cardiology suggested no changes in the medications at that time and she was discharged with metoprolol 50 mg daily. Patient again had dizziness at home and husband brought her to the emergency room for the concern.  She was noted to have A. fib with RVR which responded to Cardizem injection by ER physician.  Her vitals are stable and she did not had any signs of infection but due to recurrent atrial fibrillation complain and dizziness she was given to hospitalist service for further management.  PAST MEDICAL HISTORY:   Past Medical History:  Diagnosis Date  . Aortic stenosis   . 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 Cancer ([place])    skin  . CHF (congestive heart failure) ([place])   . Coronary artery disease   . CVA (cerebral infarction) June '16  . Hypertension   . Liver transplanted ([place])   . Macular degeneration   . Mitral valve disease   . PBC (primary biliary cirrhosis)    s/p liver transplant  . Shingles   . TIA (transient ischemic attack)     PAST SURGICAL HISTORY:  Past Surgical History:  Procedure Laterality Date  . BOWEL RESECTION    . COLONOSCOPY    .  CORONARY ARTERY BYPASS GRAFT    . JOINT REPLACEMENT    . LIVER TRANSPLANT  1990    SOCIAL HISTORY:  Social History   Tobacco Use  . Smoking status: Former Research scientist (life sciences)  . Smokeless tobacco: Never Used  Substance Use Topics  . Alcohol use: No    FAMILY HISTORY:  Family History  Problem Relation Age of Onset  . Hypertension Father   . Heart attack Father   . Stroke Mother     DRUG ALLERGIES:  Allergies  Allergen Reactions  . Cephalexin Other (See Comments)    Reaction:  Unknown  . Trazodone     Other reaction(s): Other (See Comments) Bad Dreams   . Zaleplon     Other reaction(s): Other (See Comments) Weird Dreams  . Zolpidem     Other reaction(s): Other (See Comments) Memory, worried her to take it  . Novocain [Procaine] Rash  . Sulfa Antibiotics Rash    REVIEW OF SYSTEMS:   CONSTITUTIONAL: No fever, fatigue or weakness.  EYES: No blurred or double vision.  EARS, NOSE, AND THROAT: No tinnitus or ear pain.  RESPIRATORY: No cough, shortness of breath, wheezing or hemoptysis.  CARDIOVASCULAR: No chest pain, orthopnea, edema.  GASTROINTESTINAL: No nausea, vomiting, diarrhea or abdominal pain.  GENITOURINARY: No dysuria, hematuria.  ENDOCRINE: No polyuria, nocturia,  HEMATOLOGY: No anemia, easy bruising or bleeding SKIN: No rash or lesion. MUSCULOSKELETAL: No joint pain or arthritis.   NEUROLOGIC: No  tingling, numbness, weakness.  PSYCHIATRY: No anxiety or depression.   MEDICATIONS AT HOME:  Prior to Admission medications   Medication Sig Start Date End Date Taking? Authorizing Provider  bisacodyl (DULCOLAX) 5 MG EC tablet Take 5 mg by mouth 2 (two) times daily.   Yes [provider]  LORazepam (ATIVAN) 2 MG tablet Take 1 mg by mouth 2 (two) times daily. Early evening and at about 2300   Yes [provider]  metoprolol succinate (TOPROL-XL) 50 MG 24 hr tablet Take 50 mg by mouth daily. Take with or immediately following a meal.    Yes [provider]  mycophenolate (CELLCEPT) 500 MG tablet Take 500 mg by mouth daily.    Yes [provider]  hydrOXYzine (ATARAX/VISTARIL) 25 MG tablet Take 25 mg by mouth 3 (three) times daily as needed for itching.  02/19/18   [provider]  Melatonin 1 MG TABS Take 1 mg by mouth at bedtime as needed (sleep).    [provider]  nitroGLYCERIN (NITROSTAT) 0.4 MG SL tablet Place 1 tablet under the tongue as needed. Reported on 10/26/2015 06/20/15   [provider]      PHYSICAL EXAMINATION:   VITAL SIGNS: Blood pressure 118/83, pulse 82, temperature 98 F (36.7 C), temperature source Oral, resp. rate 20, height 5\' 1"  (1.549 m), weight 50 kg, SpO2 100 %.  GENERAL:  85 y.o.-year-old patient lying in the bed with no acute distress.  EYES: Pupils equal, round, reactive to light and accommodation. No scleral icterus. Extraocular muscles intact. Blind. HEENT: Head atraumatic, normocephalic. Oropharynx and nasopharynx clear.  NECK:  Supple, no jugular venous distention. No thyroid enlargement, no tenderness.  LUNGS: Normal breath sounds bilaterally, no wheezing, rales,rhonchi or crepitation. No use of accessory muscles of respiration.  CARDIOVASCULAR: S1, S2 irregular. No murmurs, rubs, or gallops.  ABDOMEN: Soft, nontender, nondistended. Bowel sounds present. No organomegaly or mass.  EXTREMITIES: No pedal edema, cyanosis, or clubbing.  NEUROLOGIC: Cranial nerves II through XII are intact. Muscle strength 3-4/5 in all extremities. Sensation intact. Gait not checked.  PSYCHIATRIC: The patient is alert and oriented x 3.  SKIN: No obvious rash, lesion, or ulcer.   LABORATORY PANEL:   CBC Recent Labs  Lab 06/14/18 1453 06/15/18 0348 06/17/18 0847  WBC 8.0 7.5 6.4  HGB 12.7 11.2* 12.1  HCT 38.9 35.1* 37.3  PLT 202 206 171  MCV 92.0 92.9 92.3  MCH 30.0 29.6 30.0  MCHC 32.6 31.9 32.4  RDW 11.9 11.9 12.1  LYMPHSABS 0.7  --  1.9  MONOABS 0.2  --  0.6   EOSABS 0.0  --  0.2  BASOSABS 0.0  --  0.1   ------------------------------------------------------------------------------------------------------------------  Chemistries  Recent Labs  Lab 06/14/18 1453 06/15/18 0348 06/17/18 0847  NA 126* 131* 128*  K 4.8 4.5 4.4  CL 95* 102 100  CO2 24 22 22   GLUCOSE 131* 92 92  BUN 18 18 17   CREATININE 0.87 0.75 0.74  CALCIUM 8.5* 8.1* 8.4*  MG  --   --  2.1  AST 18  --  19  ALT 10  --  14  ALKPHOS 81  --  78  BILITOT 0.5  --  0.7   ------------------------------------------------------------------------------------------------------------------ estimated creatinine clearance is 33.9 mL/min (by C-G formula based on SCr of 0.74 mg/dL). ------------------------------------------------------------------------------------------------------------------ Recent Labs    06/17/18 0847  TSH 4.778*     Coagulation profile No results for input(s): INR, PROTIME in the last 168 hours. -------------------------------------------------------------------------------------------------------------------  No results for input(s): DDIMER in the last 72 hours. -------------------------------------------------------------------------------------------------------------------  Cardiac Enzymes Recent Labs  Lab 06/17/18 0847  TROPONINI 0.18*   ------------------------------------------------------------------------------------------------------------------ Invalid input(s): POCBNP  ---------------------------------------------------------------------------------------------------------------  Urinalysis    Component Value Date/Time   COLORURINE YELLOW (A) 06/17/2018 1204   APPEARANCEUR HAZY (A) 06/17/2018 1204   APPEARANCEUR Cloudy (A) 11/20/2016 1127   LABSPEC 1.008 06/17/2018 1204   LABSPEC 1.005 12/03/2013 2222   PHURINE 5.0 06/17/2018 1204   GLUCOSEU NEGATIVE 06/17/2018 1204   GLUCOSEU Negative 12/03/2013 2222   HGBUR SMALL (A)  06/17/2018 1204   BILIRUBINUR NEGATIVE 06/17/2018 1204   BILIRUBINUR Negative 11/20/2016 1127   BILIRUBINUR Negative 12/03/2013 2222   KETONESUR NEGATIVE 06/17/2018 1204   PROTEINUR NEGATIVE 06/17/2018 1204   NITRITE NEGATIVE 06/17/2018 1204   LEUKOCYTESUR MODERATE (A) 06/17/2018 1204   LEUKOCYTESUR Negative 12/03/2013 2222     RADIOLOGY: US Carotid Bilateral  Result Date: 06/17/2018 CLINICAL DATA:  CVA EXAM: BILATERAL CAROTID DUPLEX ULTRASOUND TECHNIQUE: Pearline Cables scale imaging, color Doppler and duplex ultrasound were performed of bilateral carotid and vertebral arteries in the neck. COMPARISON:  None. FINDINGS: Criteria: Quantification of carotid stenosis is based on velocity parameters that correlate the residual internal carotid diameter with NASCET-based stenosis levels, using the diameter of the distal internal carotid lumen as the denominator for stenosis measurement. The following velocity measurements were obtained: RIGHT ICA: 35 cm/sec CCA: 43 cm/sec SYSTOLIC ICA/CCA RATIO:  0.8 ECA: 34 cm/sec LEFT ICA: 56 cm/sec CCA: 45 cm/sec SYSTOLIC ICA/CCA RATIO:  1.2 ECA: 35 cm/sec RIGHT CAROTID ARTERY: Moderate focal calcified plaque in the bulb. Low resistance internal carotid Doppler pattern. RIGHT VERTEBRAL ARTERY:  Antegrade. LEFT CAROTID ARTERY: There is moderate irregular calcified plaque in the bulb and lower internal carotid artery. Low resistance internal carotid Doppler pattern is preserved. LEFT VERTEBRAL ARTERY:  Antegrade. The rhythm is irregular and atrial fibrillation is not excluded. IMPRESSION: Less than 50% stenosis in the right and left internal carotid arteries. Irregular cardiac rhythm. Electronically Signed   By: Marybelle Killings M.D.   On: 06/17/2018 12:18    EKG: Orders placed or performed during the hospital encounter of 06/17/18  . ED EKG  . ED EKG  . EKG 12-Lead  . EKG 12-Lead    IMPRESSION AND PLAN:  * A fib with RVR   Given Cardizem inj. HR stable now.   Cont  metoprolol.   Added Cardizem oral.   Check TSH   Follow troponin and tele.   Cardiology contacted.  * Elevated troponin   Likely due to A fib and RVR   Monitor. Keep on tele.  * Hyponatremia   Monitor.  * s/p liver transplant   Cont cellcept.   All the records are reviewed and case discussed with ED provider. Management plans discussed with the patient, family and they are in agreement.  CODE STATUS: DNR    Code Status Orders  (From admission, onward)         Start     Ordered   06/17/18 1410  Do not attempt resuscitation (DNR)  Continuous    Question Answer Comment  In the event of cardiac or respiratory ARREST Do not call a "code blue"   In the event of cardiac or respiratory ARREST Do not perform Intubation, CPR, defibrillation or ACLS   In the event of cardiac or respiratory ARREST Use medication by any route, position, wound care, and other measures to relive pain and suffering. May use oxygen, suction and manual treatment  of airway obstruction as needed for comfort.   Comments confirmed with daughter and husband.      06/17/18 1410        Code Status History    Date Active Date Inactive Code Status Order ID Comments User Context   06/14/2018 2043 06/15/2018 1501 DNR [id number]  Henreitta Leber, MD Inpatient   06/05/2017 1748 06/07/2017 1430 DNR [id number]  Henreitta Leber, MD Inpatient   04/23/2017 1142 04/24/2017 1547 DNR [id number]  Vaughan Basta, MD Inpatient   11/23/2016 2001 11/27/2016 1851 Full Code [id number]  Nicholes Mango, MD Inpatient   09/19/2015 0504 09/21/2015 1210 DNR [id number]  Mikael Spray, NP ED   09/19/2015 0458 09/19/2015 0458 Full Code [id number]  Mikael Spray, NP ED   07/30/2015 0508 08/01/2015 1417 DNR [id number]  Harrie Foreman, MD Inpatient   07/18/2015 2121 07/20/2015 1459 DNR [id number]  Dustin Flock, MD Inpatient   08/29/2014 2102 09/05/2014 1527 DNR [id number]  Demetrios Loll, MD ED   08/23/2014 1755 08/25/2014 1742 Full Code [id number]   Bettey Costa, MD Inpatient    Advance Directive Documentation     Most Recent Value  Type of Advance Directive  Living will  Pre-existing out of facility DNR order (yellow form or pink MOST form)  -  "MOST" Form in Place?  -     Discussed with husband in room.  TOTAL TIME TAKING CARE OF THIS PATIENT: 45 minutes.    Vaughan Basta M.D on 06/17/2018   Between 7am to 6pm - Pager - [phone]  After 6pm go to [email or web address] - password [email or web address] [facility]  Office  [phone]  CC: Primary care physician; Tracie Harrier, MD   Note: This dictation was prepared with Dragon dictation along with smaller phrase technology. Any transcriptional errors that result from this process are unintentional.

## 2018-06-17 NOTE — ED Notes (Signed)
Family at bedside. 

## 2018-06-18 ENCOUNTER — Observation Stay
Admit: 2018-06-18 | Discharge: 2018-06-18 | Disposition: A | Discharge status: Home / Self Care | Payer: Medicare Other | Attending: Internal Medicine | Admitting: Internal Medicine

## 2018-06-18 LAB — CBC
HEMATOCRIT: 34.9 % — AB (ref 36.0–46.0)
Hemoglobin: 11.2 g/dL — ABNORMAL LOW (ref 12.0–15.0)
MCH: 30.3 pg (ref 26.0–34.0)
MCHC: 32.1 g/dL (ref 30.0–36.0)
MCV: 94.3 fL (ref 80.0–100.0)
Platelets: 151 10*3/uL (ref 150–400)
RBC: 3.7 MIL/uL — ABNORMAL LOW (ref 3.87–5.11)
RDW: 12 % (ref 11.5–15.5)
WBC: 6.7 10*3/uL (ref 4.0–10.5)
nRBC: 0 % (ref 0.0–0.2)

## 2018-06-18 LAB — ECHOCARDIOGRAM COMPLETE
Height: 61 in
Weight: 1798.4 oz

## 2018-06-18 LAB — BASIC METABOLIC PANEL
Anion gap: 5 (ref 5–15)
BUN: 20 mg/dL (ref 8–23)
CHLORIDE: 103 mmol/L (ref 98–111)
CO2: 25 mmol/L (ref 22–32)
Calcium: 8.3 mg/dL — ABNORMAL LOW (ref 8.9–10.3)
Creatinine, Ser: 0.95 mg/dL (ref 0.44–1.00)
GFR calc Af Amer: >60 mL/min (ref >60)
GFR calc non Af Amer: 52 mL/min — ABNORMAL LOW (ref >60)
Glucose, Bld: 89 mg/dL (ref 70–99)
Potassium: 4.9 mmol/L (ref 3.5–5.1)
Sodium: 133 mmol/L — ABNORMAL LOW (ref 135–145)

## 2018-06-18 LAB — TROPONIN I
Troponin I: 0.2 ng/mL (ref <0.03)
Troponin I: 0.17 ng/mL (ref <0.03)

## 2018-06-18 MED ORDER — METOPROLOL TARTRATE 50 MG PO TABS
100.0000 mg | ORAL_TABLET | Freq: Two times a day (BID) | ORAL | Status: DC
  Administered 2018-06-18 – 2018-06-20 (×4): 100 mg via ORAL
  Filled 2018-06-18 (×5): qty 2

## 2018-06-18 MED ORDER — FUROSEMIDE 10 MG/ML IJ SOLN
20.0000 mg | Freq: Two times a day (BID) | INTRAMUSCULAR | Status: DC
  Administered 2018-06-18 – 2018-06-20 (×4): 20 mg via INTRAVENOUS
  Filled 2018-06-18 (×3): qty 2

## 2018-06-18 NOTE — Progress Notes (Signed)
[facility] Encounter Note  Patient: Brenda Roman / Admit Date: 06/17/2018 / Date of Encounter: 06/18/2018, 5:41 PM  3 Subjective: Patient still significantly weak and fatigued and short of breath.  Atrial fibrillation heart rate slightly improved after addition of increased dose of diltiazem and metoprolol.  Current heart rate still in atrial fibrillation with 86 bpm.  Patient is not ambulating at all at this time to assess for need for change in dosage of medication Echocardiogram showing global LV systolic dysfunction with ejection fraction of 35% and severe aortic valve stenosis  Review of Systems: Positive for: Shortness of breath palpitations weakness Negative for: Vision change, hearing change, syncope, dizziness, nausea, vomiting,diarrhea, bloody stool, stomach pain, cough, congestion, diaphoresis, urinary frequency, urinary pain,skin lesions, skin rashes Others previously listed  Objective: Telemetry: Atrial fibrillation with controlled ventricular rate Physical Exam: Blood pressure (!) 144/97, pulse 91, temperature (!) 97.4 F (36.3 C), temperature source Oral, resp. rate 18, height 5\' 1"  (1.549 m), weight 51 kg, SpO2 93 %. Body mass index is 21.24 kg/m. General: Well developed, well nourished, in no acute distress. Head: Normocephalic, atraumatic, sclera non-icteric, no xanthomas, nares are without discharge. Neck: No apparent masses Lungs: Normal respirations with no wheezes, no rhonchi, no rales , no crackles   Heart: Irregular rate and rhythm, normal S1 absent S2, 3-4+ aortic murmur, no rub, no gallop, PMI is normal size and placement, carotid upstroke normal with bruit, jugular venous pressure normal Abdomen: Soft, non-tender, non-distended with normoactive bowel sounds. No hepatosplenomegaly. Abdominal aorta is normal size without bruit Extremities: Trace edema, no clubbing, no cyanosis, no ulcers,  Peripheral: 2+ radial, 2+ femoral, 2+ dorsal pedal  pulses Neuro: Alert and oriented. Moves all extremities spontaneously. Psych:  Responds to questions appropriately with a normal affect.   Intake/Output Summary (Last 24 hours) at 06/18/2018 1741 Last data filed at 06/18/2018 1100 Gross per 24 hour  Intake 240 ml  Output 600 ml  Net -360 ml    Inpatient Medications:  . bisacodyl  5 mg Oral BID  . diltiazem  60 mg Oral Q8H  . heparin  5,000 Units Subcutaneous Q8H  . LORazepam  1 mg Oral BID  . metoprolol tartrate  100 mg Oral BID  . mycophenolate  500 mg Oral Daily   Infusions:   Labs: Recent Labs    06/17/18 0847 06/18/18 0434  NA 128* 133*  K 4.4 4.9  CL 100 103  CO2 22 25  GLUCOSE 92 89  BUN 17 20  CREATININE 0.74 0.95  CALCIUM 8.4* 8.3*  MG 2.1  --    Recent Labs    06/17/18 0847  AST 19  ALT 14  ALKPHOS 78  BILITOT 0.7  PROT 6.2*  ALBUMIN 3.3*   Recent Labs    06/17/18 0847 06/18/18 0434  WBC 6.4 6.7  NEUTROABS 3.5  --   HGB 12.1 11.2*  HCT 37.3 34.9*  MCV 92.3 94.3  PLT 171 151   Recent Labs    06/17/18 0847 06/17/18 2220 06/18/18 0434 06/18/18 0955  TROPONINI 0.18* 0.20* 0.20* 0.17*   Invalid input(s): POCBNP No results for input(s): HGBA1C in the last 72 hours.   Weights: Filed Weights   06/17/18 0845 06/18/18 0454  Weight: 50 kg 51 kg     Radiology/Studies:  Dg Chest 2 View  Result Date: 06/14/2018 CLINICAL DATA:  Pain all over. Possible pneumonia. EXAM: CHEST - 2 VIEW COMPARISON:  05/23/2018 FINDINGS: Enlarged heart. Calcific atherosclerotic disease and tortuosity  of the aorta. Stable postsurgical changes. Mild pulmonary vascular congestion Right pleural effusion. Stable compression deformities of the lower thoracic vertebral bodies. IMPRESSION: 1. Cardiomegaly with mild pulmonary vascular congestion. 2. Right pleural effusion. Electronically Signed   By: Fidela Salisbury M.D.   On: 06/14/2018 16:35   Ct Chest W Contrast  Result Date: 06/14/2018 CLINICAL DATA:  Altered  mental status.  Lethargy. EXAM: CT CHEST WITH CONTRAST TECHNIQUE: Multidetector CT imaging of the chest was performed during intravenous contrast administration. CONTRAST:  28mL OMNIPAQUE IOHEXOL 300 MG/ML  SOLN COMPARISON:  Chest x-ray from same day. CT chest dated January 30, 2017. FINDINGS: Cardiovascular: Stable cardiomegaly with prominent left atrial enlargement. No thoracic aortic aneurysm or dissection. Coronary, aortic arch, and branch vessel atherosclerotic vascular disease. No pulmonary embolism. Stable moderate dilatation of the left and right pulmonary arteries. Mediastinum/Nodes: No enlarged mediastinal, hilar, or axillary lymph nodes. Thyroid gland, trachea, and esophagus demonstrate no significant findings. Lungs/Pleura: Small right pleural effusion. Small amount of loculated pleural fluid along the superior right major fissure. Associated subsegmental atelectasis in the right lower lobe. Unchanged scarring at the lung bases. No consolidation or pneumothorax. Interval mild enlargement of a pleural based nodule along the superior left major fissure, now measuring 6 x 10 mm, previously 4 x 7 mm. Upper Abdomen: No acute abnormality. Unchanged pneumobilia and left renal cyst. Musculoskeletal: No chest wall abnormality. No acute or significant osseous findings. Unchanged chronic thoracic compression deformities. Unchanged healed bilateral posterior rib fractures. IMPRESSION: 1. Small right pleural effusion with adjacent right lower lobe atelectasis. No evidence of pneumonia. 2. Slight interval increase in size of a pleural-based nodule along the superior left major fissure, now measuring 8 mm in aggregate size, previously 6 mm. Non-contrast chest CT at 6-12 months is recommended. If the nodule is stable at time of repeat CT, then future CT at 18-24 months (from today's scan) is considered optional for low-risk patients, but is recommended for high-risk patients. This recommendation follows the consensus  statement: Guidelines for Management of Incidental Pulmonary Nodules Detected on CT Images: From the Fleischner Society 2017; Radiology 2017; 284:228-243. 3. Stable pulmonary artery enlargement, suggestive of pulmonary arterial hypertension. 4.  Aortic atherosclerosis (ICD10-I70.0). Electronically Signed   By: Titus Dubin M.D.   On: 06/14/2018 18:03   US Carotid Bilateral  Result Date: 06/17/2018 CLINICAL DATA:  CVA EXAM: BILATERAL CAROTID DUPLEX ULTRASOUND TECHNIQUE: Pearline Cables scale imaging, color Doppler and duplex ultrasound were performed of bilateral carotid and vertebral arteries in the neck. COMPARISON:  None. FINDINGS: Criteria: Quantification of carotid stenosis is based on velocity parameters that correlate the residual internal carotid diameter with NASCET-based stenosis levels, using the diameter of the distal internal carotid lumen as the denominator for stenosis measurement. The following velocity measurements were obtained: RIGHT ICA: 35 cm/sec CCA: 43 cm/sec SYSTOLIC ICA/CCA RATIO:  0.8 ECA: 34 cm/sec LEFT ICA: 56 cm/sec CCA: 45 cm/sec SYSTOLIC ICA/CCA RATIO:  1.2 ECA: 35 cm/sec RIGHT CAROTID ARTERY: Moderate focal calcified plaque in the bulb. Low resistance internal carotid Doppler pattern. RIGHT VERTEBRAL ARTERY:  Antegrade. LEFT CAROTID ARTERY: There is moderate irregular calcified plaque in the bulb and lower internal carotid artery. Low resistance internal carotid Doppler pattern is preserved. LEFT VERTEBRAL ARTERY:  Antegrade. The rhythm is irregular and atrial fibrillation is not excluded. IMPRESSION: Less than 50% stenosis in the right and left internal carotid arteries. Irregular cardiac rhythm. Electronically Signed   By: Marybelle Killings M.D.   On: 06/17/2018 12:18   Dg Chest  Portable 1 View  Result Date: 05/23/2018 CLINICAL DATA:  Weakness and not feeling well associated with dizziness for 2 days. Weakness for 2 months. EXAM: PORTABLE CHEST 1 VIEW COMPARISON:  03/04/2018 and multiple  earlier exams. FINDINGS: There are changes from CABG surgery. Cardiac silhouette is mildly enlarged. No mediastinal or hilar masses. There are prominent bronchovascular markings thickened interstitial markings, as well as chronic areas of reticular/linear scarring. There is additional airspace opacity in the right lateral mid to lower lung and opacity of both lung bases consistent with small effusions. No pneumothorax. Skeletal structures are grossly intact. IMPRESSION: 1. Findings consistent with mild congestive heart failure superimposed on chronic interstitial thickening. 2. Mild stable cardiomegaly and stable changes from prior CABG surgery. 3. Peripheral airspace opacity in the right mid to lower lung represents a change from prior studies. This may be due to atelectasis or focal edema or a small area of loculated fluid. Pneumonia is possible. Electronically Signed   By: Lajean Manes M.D.   On: 05/23/2018 16:24     Assessment and Recommendation  85 y.o. female with known acute on chronic systolic dysfunction congestive heart failure with severe aortic valve stenosis and atrial fibrillation with rapid ventricular rate needing medication management 1.  Continue higher doses of metoprolol for LV systolic dysfunction and heart rate control 2.  Diltiazem as necessary for heart rate control although preference for metoprolol 3.  Diuresis as necessary for evidence of heart failure pulmonary edema and lower extremity edema as needed 4.  No further cardiac diagnostics necessary at this time 5.  No anticoagulation at this time due to advanced age bleeding complication concerns and fall risk  Signed, Serafina Royals M.D. FACC

## 2018-06-18 NOTE — Care Management Note (Signed)
Case Management Note  Patient Details  Name: Navada Osterhout MRN: [id number] Date of Birth: [birth year]  Subjective/Objective:  Patient admitted to [facility] under observation status for a fib and RVR.  RNCM consulted on patient to provide MOON letter and complete assessment. Patient currently lives with her husband who is her primary caregiver. Per the husband he has hired private care sitters to come into the home daily from 8 am to 1pm. Patient is wheelchair bound at baseline and uses a bedside commode as well as the wheelchair. Patient needs significant help with activities of daily living but is able to manage these with help of spouse and private care sitters. Spouse is interested in home health. CMS Medicare.gov Compare Post Acute Care list reviewed with them and they prefer to have home health at discharge but are not ready to make a decision on an agency. There are no DME needs. PCP is [facility]. Uses [facility] in [place] and obtains medications without issues.                   Action/Plan: RNCM team will follow to assist with home health at discharge.  Wean off acute O2  Expected Discharge Date:                  Expected Discharge Plan:     In-House Referral:     Discharge planning Services  CM Consult  Post Acute Care Choice:  Home Health Choice offered to:     DME Arranged:    DME Agency:     HH Arranged:    HH Agency:     Status of Service:  In process, will continue to follow  If discussed at Long Length of Stay Meetings, dates discussed:    Additional Comments:  Latanya Maudlin, RN 06/18/2018, 1:06 PM

## 2018-06-18 NOTE — Care Management Obs Status (Signed)
[place] NOTIFICATION   Patient Details  Name: Brenda Roman MRN: [id number] Date of Birth: 1923/11/01   Medicare Observation Status Notification Given:  Yes    Janice Seales A Yuriko Portales, RN 06/18/2018, 1:04 PM

## 2018-06-18 NOTE — Progress Notes (Signed)
[facility] Cardiology  SUBJECTIVE:  Brenda Roman is doing well this morning. She received two dose of IV diltiazem last night given continued rapid heart rate. Heart rate now in the 60 - 70 BPM range. She no longer feels dizzy. She continues to deny chest pain or shortness of breath.   Vitals:   06/17/18 1730 06/17/18 1957 06/18/18 0454 06/18/18 0755  BP: 118/77 118/83 (!) 115/93 131/90  Pulse: (!) 53 82 81 69  Resp: (!) 21 20 20 18   Temp:  98 F (36.7 C) 97.8 F (36.6 C)   TempSrc:  Oral Oral   SpO2: 96% 100% 99% 98%  Weight:   51 kg   Height:         Intake/Output Summary (Last 24 hours) at 06/18/2018 7253 Last data filed at 06/18/2018 0456 Gross per 24 hour  Intake -  Output 300 ml  Net -300 ml    PHYSICAL EXAM  General: Thin, frail appearing. In no acute distress HEENT:  Normocephalic and atramatic Neck:  No JVD.  Lungs: Clear bilaterally to auscultation and percussion. Heart: Irregularly irregular rhythm. Holosystolic murmur heard across precordium.   Extremities: No clubbing, cyanosis or edema.   Neuro: Alert and oriented X 3. Psych:  Calm affect  LABS: Basic Metabolic Panel: Recent Labs    06/17/18 0847 06/18/18 0434  NA 128* 133*  K 4.4 4.9  CL 100 103  CO2 22 25  GLUCOSE 92 89  BUN 17 20  CREATININE 0.74 0.95  CALCIUM 8.4* 8.3*  MG 2.1  --    Liver Function Tests: Recent Labs    06/17/18 0847  AST 19  ALT 14  ALKPHOS 78  BILITOT 0.7  PROT 6.2*  ALBUMIN 3.3*   No results for input(s): LIPASE, AMYLASE in the last 72 hours. CBC: Recent Labs    06/17/18 0847 06/18/18 0434  WBC 6.4 6.7  NEUTROABS 3.5  --   HGB 12.1 11.2*  HCT 37.3 34.9*  MCV 92.3 94.3  PLT 171 151   Cardiac Enzymes: Recent Labs    06/17/18 0847 06/17/18 2220 06/18/18 0434  TROPONINI 0.18* 0.20* 0.20*   BNP: Invalid input(s): POCBNP D-Dimer: No results for input(s): DDIMER in the last 72 hours. Hemoglobin A1C: No results for input(s): HGBA1C in the last 72  hours. Fasting Lipid Panel: No results for input(s): CHOL, HDL, LDLCALC, TRIG, CHOLHDL, LDLDIRECT in the last 72 hours. Thyroid Function Tests: Recent Labs    06/17/18 0847  TSH 4.778*   Anemia Panel: No results for input(s): VITAMINB12, FOLATE, FERRITIN, TIBC, IRON, RETICCTPCT in the last 72 hours.  US Carotid Bilateral  Result Date: 06/17/2018 CLINICAL DATA:  CVA EXAM: BILATERAL CAROTID DUPLEX ULTRASOUND TECHNIQUE: Pearline Cables scale imaging, color Doppler and duplex ultrasound were performed of bilateral carotid and vertebral arteries in the neck. COMPARISON:  None. FINDINGS: Criteria: Quantification of carotid stenosis is based on velocity parameters that correlate the residual internal carotid diameter with NASCET-based stenosis levels, using the diameter of the distal internal carotid lumen as the denominator for stenosis measurement. The following velocity measurements were obtained: RIGHT ICA: 35 cm/sec CCA: 43 cm/sec SYSTOLIC ICA/CCA RATIO:  0.8 ECA: 34 cm/sec LEFT ICA: 56 cm/sec CCA: 45 cm/sec SYSTOLIC ICA/CCA RATIO:  1.2 ECA: 35 cm/sec RIGHT CAROTID ARTERY: Moderate focal calcified plaque in the bulb. Low resistance internal carotid Doppler pattern. RIGHT VERTEBRAL ARTERY:  Antegrade. LEFT CAROTID ARTERY: There is moderate irregular calcified plaque in the bulb and lower internal carotid artery. Low resistance internal  carotid Doppler pattern is preserved. LEFT VERTEBRAL ARTERY:  Antegrade. The rhythm is irregular and atrial fibrillation is not excluded. IMPRESSION: Less than 50% stenosis in the right and left internal carotid arteries. Irregular cardiac rhythm. Electronically Signed   By: Marybelle Killings M.D.   On: 06/17/2018 12:18    ASSESSMENT AND PLAN: 85 year old female with dilated cardiomyopathy, essential hypertension, mixed hyperlipidemia, and atrial fibrillation with rapid ventricular rate, now with improved heart rate control following medication adjustment. Heart rate in the 70s after  addition of diltiazem and an increased metoprolol dose.   Active Problems:   Dizziness   Atrial fibrillation with RVR (HCC)   Plan:  - Given need for additional medication for heart rate control yesterday with diltiazem, will recommend increasing metoprolol succinate from 50 mg to 100 mg daily.  - Recommend discontinuing oral diltiazem given patient's LV dysfunction as noted on ECHO from 05/2017 with EF of 35-40%. Attempt heart rate control with metoprolol alone.   The patient's history and exam findings were discussed with Dr. Nehemiah Massed. The plan was made in conjunction with Dr. Nehemiah Massed.  Hilbert Odor, PA-C 06/18/2018 8:07 AM

## 2018-06-18 NOTE — Progress Notes (Signed)
[place] [facility]                                                                                                                                                                                  Patient Demographics   Brenda Roman, is a 85 y.o. female, DOB - [birth year], QPR:[id number]  Admit date - 06/17/2018   Admitting Physician Vaughan Basta, MD  Outpatient Primary MD for the patient is Tracie Harrier, MD   LOS - 0  Subjective: Patient with recurrent admission Heart rate is now improved. Patient on oxygen now  Review of Systems:   CONSTITUTIONAL: No documented fever.  Positive fatigue, positive weakness. No weight gain, no weight loss.  EYES: No blurry or double vision.  ENT: No tinnitus. No postnasal drip. No redness of the oropharynx.  RESPIRATORY: No cough, no wheeze, no hemoptysis. No dyspnea.  CARDIOVASCULAR: No chest pain. No orthopnea. No palpitations. No syncope.  GASTROINTESTINAL: No nausea, no vomiting or diarrhea. No abdominal pain. No melena or hematochezia.  GENITOURINARY: No dysuria or hematuria.  ENDOCRINE: No polyuria or nocturia. No heat or cold intolerance.  HEMATOLOGY: No anemia. No bruising. No bleeding.  INTEGUMENTARY: No rashes. No lesions.  MUSCULOSKELETAL: No arthritis. No swelling. No gout.  NEUROLOGIC: No numbness, tingling, or ataxia. No seizure-type activity.  PSYCHIATRIC: No anxiety. No insomnia. No ADD.    Vitals:   Vitals:   06/17/18 1730 06/17/18 1957 06/18/18 0454 06/18/18 0755  BP: 118/77 118/83 (!) 115/93 131/90  Pulse: (!) 53 82 81 69  Resp: (!) 21 20 20 18   Temp:  98 F (36.7 C) 97.8 F (36.6 C)   TempSrc:  Oral Oral   SpO2: 96% 100% 99% 98%  Weight:   51 kg   Height:        Wt Readings from Last 3 Encounters:  06/18/18 51 kg  06/15/18 50.2 kg  05/23/18 48.5 kg     Intake/Output Summary (Last 24 hours) at 06/18/2018 1527 Last data filed at 06/18/2018 1100 Gross per 24 hour   Intake 240 ml  Output 600 ml  Net -360 ml    Physical Exam:   GENERAL: Pleasant-appearing in no apparent distress.  HEAD, EYES, EARS, NOSE AND THROAT: Atraumatic, normocephalic. Extraocular muscles are intact. Pupils equal and reactive to light. Sclerae anicteric. No conjunctival injection. No oro-pharyngeal erythema.  NECK: Supple. There is no jugular venous distention. No bruits, no lymphadenopathy, no thyromegaly.  HEART: Irregularly irregular.  Positive systolic murmurs, no rubs, no clicks.  LUNGS: Clear to auscultation bilaterally. No rales or rhonchi. No wheezes.  ABDOMEN: Soft, flat, nontender, nondistended. Has good bowel sounds. No hepatosplenomegaly appreciated.  EXTREMITIES: No evidence of any cyanosis, clubbing, or peripheral edema.  +2 pedal and radial pulses bilaterally.  NEUROLOGIC: The patient is alert, awake, and oriented x3 with no focal motor or sensory deficits appreciated bilaterally.  SKIN: Moist and warm with no rashes appreciated.  Psych: Not anxious, depressed LN: No inguinal LN enlargement    Antibiotics   Anti-infectives (From admission, onward)   None      Medications   Scheduled Meds: . bisacodyl  5 mg Oral BID  . diltiazem  60 mg Oral Q8H  . heparin  5,000 Units Subcutaneous Q8H  . LORazepam  1 mg Oral BID  . metoprolol tartrate  100 mg Oral BID  . mycophenolate  500 mg Oral Daily   Continuous Infusions: PRN Meds:.acetaminophen, docusate sodium, hydrOXYzine, Melatonin, nitroGLYCERIN   Data Review:   Micro Results No results found for this or any previous visit (from the past 240 hour(s)).  Radiology Reports Dg Chest 2 View  Result Date: 06/14/2018 CLINICAL DATA:  Pain all over. Possible pneumonia. EXAM: CHEST - 2 VIEW COMPARISON:  05/23/2018 FINDINGS: Enlarged heart. Calcific atherosclerotic disease and tortuosity of the aorta. Stable postsurgical changes. Mild pulmonary vascular congestion Right pleural effusion. Stable compression  deformities of the lower thoracic vertebral bodies. IMPRESSION: 1. Cardiomegaly with mild pulmonary vascular congestion. 2. Right pleural effusion. Electronically Signed   By: Fidela Salisbury M.D.   On: 06/14/2018 16:35   Ct Chest W Contrast  Result Date: 06/14/2018 CLINICAL DATA:  Altered mental status.  Lethargy. EXAM: CT CHEST WITH CONTRAST TECHNIQUE: Multidetector CT imaging of the chest was performed during intravenous contrast administration. CONTRAST:  6mL OMNIPAQUE IOHEXOL 300 MG/ML  SOLN COMPARISON:  Chest x-ray from same day. CT chest dated January 30, 2017. FINDINGS: Cardiovascular: Stable cardiomegaly with prominent left atrial enlargement. No thoracic aortic aneurysm or dissection. Coronary, aortic arch, and branch vessel atherosclerotic vascular disease. No pulmonary embolism. Stable moderate dilatation of the left and right pulmonary arteries. Mediastinum/Nodes: No enlarged mediastinal, hilar, or axillary lymph nodes. Thyroid gland, trachea, and esophagus demonstrate no significant findings. Lungs/Pleura: Small right pleural effusion. Small amount of loculated pleural fluid along the superior right major fissure. Associated subsegmental atelectasis in the right lower lobe. Unchanged scarring at the lung bases. No consolidation or pneumothorax. Interval mild enlargement of a pleural based nodule along the superior left major fissure, now measuring 6 x 10 mm, previously 4 x 7 mm. Upper Abdomen: No acute abnormality. Unchanged pneumobilia and left renal cyst. Musculoskeletal: No chest wall abnormality. No acute or significant osseous findings. Unchanged chronic thoracic compression deformities. Unchanged healed bilateral posterior rib fractures. IMPRESSION: 1. Small right pleural effusion with adjacent right lower lobe atelectasis. No evidence of pneumonia. 2. Slight interval increase in size of a pleural-based nodule along the superior left major fissure, now measuring 8 mm in aggregate size,  previously 6 mm. Non-contrast chest CT at 6-12 months is recommended. If the nodule is stable at time of repeat CT, then future CT at 18-24 months (from today's scan) is considered optional for low-risk patients, but is recommended for high-risk patients. This recommendation follows the consensus statement: Guidelines for Management of Incidental Pulmonary Nodules Detected on CT Images: From the Fleischner Society 2017; Radiology 2017; 284:228-243. 3. Stable pulmonary artery enlargement, suggestive of pulmonary arterial hypertension. 4.  Aortic atherosclerosis (ICD10-I70.0). Electronically Signed   By: Titus Dubin M.D.   On: 06/14/2018 18:03   US Carotid Bilateral  Result Date: 06/17/2018 CLINICAL DATA:  CVA EXAM: BILATERAL  CAROTID DUPLEX ULTRASOUND TECHNIQUE: Pearline Cables scale imaging, color Doppler and duplex ultrasound were performed of bilateral carotid and vertebral arteries in the neck. COMPARISON:  None. FINDINGS: Criteria: Quantification of carotid stenosis is based on velocity parameters that correlate the residual internal carotid diameter with NASCET-based stenosis levels, using the diameter of the distal internal carotid lumen as the denominator for stenosis measurement. The following velocity measurements were obtained: RIGHT ICA: 35 cm/sec CCA: 43 cm/sec SYSTOLIC ICA/CCA RATIO:  0.8 ECA: 34 cm/sec LEFT ICA: 56 cm/sec CCA: 45 cm/sec SYSTOLIC ICA/CCA RATIO:  1.2 ECA: 35 cm/sec RIGHT CAROTID ARTERY: Moderate focal calcified plaque in the bulb. Low resistance internal carotid Doppler pattern. RIGHT VERTEBRAL ARTERY:  Antegrade. LEFT CAROTID ARTERY: There is moderate irregular calcified plaque in the bulb and lower internal carotid artery. Low resistance internal carotid Doppler pattern is preserved. LEFT VERTEBRAL ARTERY:  Antegrade. The rhythm is irregular and atrial fibrillation is not excluded. IMPRESSION: Less than 50% stenosis in the right and left internal carotid arteries. Irregular cardiac rhythm.  Electronically Signed   By: Marybelle Killings M.D.   On: 06/17/2018 12:18   Dg Chest Portable 1 View  Result Date: 05/23/2018 CLINICAL DATA:  Weakness and not feeling well associated with dizziness for 2 days. Weakness for 2 months. EXAM: PORTABLE CHEST 1 VIEW COMPARISON:  03/04/2018 and multiple earlier exams. FINDINGS: There are changes from CABG surgery. Cardiac silhouette is mildly enlarged. No mediastinal or hilar masses. There are prominent bronchovascular markings thickened interstitial markings, as well as chronic areas of reticular/linear scarring. There is additional airspace opacity in the right lateral mid to lower lung and opacity of both lung bases consistent with small effusions. No pneumothorax. Skeletal structures are grossly intact. IMPRESSION: 1. Findings consistent with mild congestive heart failure superimposed on chronic interstitial thickening. 2. Mild stable cardiomegaly and stable changes from prior CABG surgery. 3. Peripheral airspace opacity in the right mid to lower lung represents a change from prior studies. This may be due to atelectasis or focal edema or a small area of loculated fluid. Pneumonia is possible. Electronically Signed   By: Lajean Manes M.D.   On: 05/23/2018 16:24     CBC Recent Labs  Lab 06/14/18 1453 06/15/18 0348 06/17/18 0847 06/18/18 0434  WBC 8.0 7.5 6.4 6.7  HGB 12.7 11.2* 12.1 11.2*  HCT 38.9 35.1* 37.3 34.9*  PLT 202 206 171 151  MCV 92.0 92.9 92.3 94.3  MCH 30.0 29.6 30.0 30.3  MCHC 32.6 31.9 32.4 32.1  RDW 11.9 11.9 12.1 12.0  LYMPHSABS 0.7  --  1.9  --   MONOABS 0.2  --  0.6  --   EOSABS 0.0  --  0.2  --   BASOSABS 0.0  --  0.1  --     Chemistries  Recent Labs  Lab 06/14/18 1453 06/15/18 0348 06/17/18 0847 06/18/18 0434  NA 126* 131* 128* 133*  K 4.8 4.5 4.4 4.9  CL 95* 102 100 103  CO2 24 22 22 25   GLUCOSE 131* 92 92 89  BUN 18 18 17 20   CREATININE 0.87 0.75 0.74 0.95  CALCIUM 8.5* 8.1* 8.4* 8.3*  MG  --   --  2.1  --    AST 18  --  19  --   ALT 10  --  14  --   ALKPHOS 81  --  78  --   BILITOT 0.5  --  0.7  --    ------------------------------------------------------------------------------------------------------------------ estimated creatinine clearance is  28.5 mL/min (by C-G formula based on SCr of 0.95 mg/dL). ------------------------------------------------------------------------------------------------------------------ No results for input(s): HGBA1C in the last 72 hours. ------------------------------------------------------------------------------------------------------------------ No results for input(s): CHOL, HDL, LDLCALC, TRIG, CHOLHDL, LDLDIRECT in the last 72 hours. ------------------------------------------------------------------------------------------------------------------ Recent Labs    06/17/18 0847  TSH 4.778*   ------------------------------------------------------------------------------------------------------------------ No results for input(s): VITAMINB12, FOLATE, FERRITIN, TIBC, IRON, RETICCTPCT in the last 72 hours.  Coagulation profile No results for input(s): INR, PROTIME in the last 168 hours.  No results for input(s): DDIMER in the last 72 hours.  Cardiac Enzymes Recent Labs  Lab 06/17/18 2220 06/18/18 0434 06/18/18 0955  TROPONINI 0.20* 0.20* 0.17*   ------------------------------------------------------------------------------------------------------------------ Invalid input(s): POCBNP    Assessment & Plan  Patient is 85 year old with history atrial fibrillation presenting with syncope    * A fib with RVR   Continue diltiazem every 8 hours Continue metoprolol 100 twice daily I suspect this is related to worsening aortic stenosis-echo from 2019 shows severe aortic stenosis I have ordered an echocardiogram of the heart  *Hypoxia with new oxygen requirement Chest x-ray on 2/22 showed some CHF I will repeat a chest x-ray I will give very  low-dose IV Lasix  * Elevated troponin   Likely due to A fib and RVR   Monitor. Keep on tele.  * Hyponatremia   Monitor.  * s/p liver transplant history of primary biliary cirrhosis   Cont cellcept.)  *Updated husband if she has severe aortic stenosis prognosis very poor     Code Status Orders  (From admission, onward)         Start     Ordered   06/17/18 1410  Do not attempt resuscitation (DNR)  Continuous    Question Answer Comment  In the event of cardiac or respiratory ARREST Do not call a "code blue"   In the event of cardiac or respiratory ARREST Do not perform Intubation, CPR, defibrillation or ACLS   In the event of cardiac or respiratory ARREST Use medication by any route, position, wound care, and other measures to relive pain and suffering. May use oxygen, suction and manual treatment of airway obstruction as needed for comfort.   Comments confirmed with daughter and husband.      06/17/18 1410        Code Status History    Date Active Date Inactive Code Status Order ID Comments User Context   06/14/2018 2043 06/15/2018 1501 DNR [id number]  Henreitta Leber, MD Inpatient   06/05/2017 1748 06/07/2017 1430 DNR [id number]  Henreitta Leber, MD Inpatient   04/23/2017 1142 04/24/2017 1547 DNR [id number]  Vaughan Basta, MD Inpatient   11/23/2016 2001 11/27/2016 1851 Full Code [id number]  Nicholes Mango, MD Inpatient   09/19/2015 0504 09/21/2015 1210 DNR [id number]  Mikael Spray, NP ED   09/19/2015 0458 09/19/2015 0458 Full Code [id number]  Mikael Spray, NP ED   07/30/2015 0508 08/01/2015 1417 DNR [id number]  Harrie Foreman, MD Inpatient   07/18/2015 2121 07/20/2015 1459 DNR [id number]  Dustin Flock, MD Inpatient   08/29/2014 2102 09/05/2014 1527 DNR [id number]  Demetrios Loll, MD ED   08/23/2014 1755 08/25/2014 1742 Full Code [id number]  Bettey Costa, MD Inpatient    Advance Directive Documentation     Most Recent Value  Type of Advance Directive  Living will  Pre-existing  out of facility DNR order (yellow form or pink MOST form)  -  "MOST" Form in Place?  -  Consults cardiology  DVT Prophylaxis   heparin  Lab Results  Component Value Date   PLT 151 06/18/2018     Time Spent in minutes  21min  Greater than 50% of time spent in care coordination and counseling patient regarding the condition and plan of care.   Dustin Flock M.D on 06/18/2018 at 3:27 PM  Between 7am to 6pm - Pager - [phone]  After 6pm go to [email or web address] - Proofreader  [employer]   Office  [phone]

## 2018-06-18 NOTE — Progress Notes (Signed)
*  PRELIMINARY RESULTS* Echocardiogram 2D Echocardiogram has been performed.  Brenda Roman 06/18/2018, 12:10 PM

## 2018-06-19 LAB — CBC
HEMATOCRIT: 36.1 % (ref 36.0–46.0)
Hemoglobin: 11.5 g/dL — ABNORMAL LOW (ref 12.0–15.0)
MCH: 29.9 pg (ref 26.0–34.0)
MCHC: 31.9 g/dL (ref 30.0–36.0)
MCV: 93.8 fL (ref 80.0–100.0)
Platelets: 153 10*3/uL (ref 150–400)
RBC: 3.85 MIL/uL — ABNORMAL LOW (ref 3.87–5.11)
RDW: 12.2 % (ref 11.5–15.5)
WBC: 5.5 10*3/uL (ref 4.0–10.5)
nRBC: 0 % (ref 0.0–0.2)

## 2018-06-19 LAB — BASIC METABOLIC PANEL
Anion gap: 8 (ref 5–15)
BUN: 18 mg/dL (ref 8–23)
CHLORIDE: 99 mmol/L (ref 98–111)
CO2: 23 mmol/L (ref 22–32)
Calcium: 8.4 mg/dL — ABNORMAL LOW (ref 8.9–10.3)
Creatinine, Ser: 0.84 mg/dL (ref 0.44–1.00)
GFR calc Af Amer: >60 mL/min (ref >60)
GFR calc non Af Amer: >60 mL/min (ref >60)
Glucose, Bld: 98 mg/dL (ref 70–99)
Potassium: 4.4 mmol/L (ref 3.5–5.1)
Sodium: 130 mmol/L — ABNORMAL LOW (ref 135–145)

## 2018-06-19 LAB — GLUCOSE, CAPILLARY
Glucose-Capillary: 84 mg/dL (ref 70–99)
Glucose-Capillary: 104 mg/dL — ABNORMAL HIGH (ref 70–99)
Glucose-Capillary: 115 mg/dL — ABNORMAL HIGH (ref 70–99)

## 2018-06-19 NOTE — Progress Notes (Signed)
[facility] Encounter Note  Patient: Brenda Roman / Admit Date: 06/17/2018 / Date of Encounter: 06/19/2018, 8:24 AM  3 Subjective: Patient still significantly weak and fatigued and short of breath but significantly improved from admission.  Atrial fibrillation heart rate slightly improved after addition of increased dose of diltiazem and metoprolol.  Current heart rate still in atrial fibrillation with 60 bpm -86 bpm.  Patient is not ambulating at all at this time to assess for need for change in dosage of medication Echocardiogram showing global LV systolic dysfunction with ejection fraction of 35% and severe aortic valve stenosis  Review of Systems: Positive for: Shortness of breath palpitations weakness Negative for: Vision change, hearing change, syncope, dizziness, nausea, vomiting,diarrhea, bloody stool, stomach pain, cough, congestion, diaphoresis, urinary frequency, urinary pain,skin lesions, skin rashes Others previously listed  Objective: Telemetry: Atrial fibrillation with controlled ventricular rate Physical Exam: Blood pressure 106/61, pulse (!) 45, temperature 97.9 F (36.6 C), resp. rate 18, height 5\' 1"  (1.549 m), weight 50.4 kg, SpO2 93 %. Body mass index is 21.01 kg/m. General: Well developed, well nourished, in no acute distress. Head: Normocephalic, atraumatic, sclera non-icteric, no xanthomas, nares are without discharge. Neck: No apparent masses Lungs: Normal respirations with no wheezes, no rhonchi, no rales , some basilar crackles   Heart: Irregular rate and rhythm, normal S1 absent S2, 3-4+ aortic murmur, no rub, no gallop, PMI is normal size and placement, carotid upstroke normal with bruit, jugular venous pressure normal Abdomen: Soft, non-tender, non-distended with normoactive bowel sounds. No hepatosplenomegaly. Abdominal aorta is normal size without bruit Extremities: Trace edema, no clubbing, no cyanosis, no ulcers,  Peripheral: 2+  radial, 2+ femoral, 2+ dorsal pedal pulses Neuro: Alert and oriented. Moves all extremities spontaneously. Psych:  Responds to questions appropriately with a normal affect.   Intake/Output Summary (Last 24 hours) at 06/19/2018 0824 Last data filed at 06/19/2018 0533 Gross per 24 hour  Intake 240 ml  Output 1300 ml  Net -1060 ml    Inpatient Medications:  . bisacodyl  5 mg Oral BID  . diltiazem  60 mg Oral Q8H  . furosemide  20 mg Intravenous Q12H  . heparin  5,000 Units Subcutaneous Q8H  . LORazepam  1 mg Oral BID  . metoprolol tartrate  100 mg Oral BID  . mycophenolate  500 mg Oral Daily   Infusions:   Labs: Recent Labs    06/17/18 0847 06/18/18 0434 06/19/18 0541  NA 128* 133* 130*  K 4.4 4.9 4.4  CL 100 103 99  CO2 22 25 23   GLUCOSE 92 89 98  BUN 17 20 18   CREATININE 0.74 0.95 0.84  CALCIUM 8.4* 8.3* 8.4*  MG 2.1  --   --    Recent Labs    06/17/18 0847  AST 19  ALT 14  ALKPHOS 78  BILITOT 0.7  PROT 6.2*  ALBUMIN 3.3*   Recent Labs    06/17/18 0847 06/18/18 0434 06/19/18 0541  WBC 6.4 6.7 5.5  NEUTROABS 3.5  --   --   HGB 12.1 11.2* 11.5*  HCT 37.3 34.9* 36.1  MCV 92.3 94.3 93.8  PLT 171 151 153   Recent Labs    06/17/18 0847 06/17/18 2220 06/18/18 0434 06/18/18 0955  TROPONINI 0.18* 0.20* 0.20* 0.17*   Invalid input(s): POCBNP No results for input(s): HGBA1C in the last 72 hours.   Weights: Filed Weights   06/17/18 0845 06/18/18 0454 06/19/18 0417  Weight: 50 kg 51 kg 50.4  kg     Radiology/Studies:  Dg Chest 2 View  Result Date: 06/14/2018 CLINICAL DATA:  Pain all over. Possible pneumonia. EXAM: CHEST - 2 VIEW COMPARISON:  05/23/2018 FINDINGS: Enlarged heart. Calcific atherosclerotic disease and tortuosity of the aorta. Stable postsurgical changes. Mild pulmonary vascular congestion Right pleural effusion. Stable compression deformities of the lower thoracic vertebral bodies. IMPRESSION: 1. Cardiomegaly with mild pulmonary vascular  congestion. 2. Right pleural effusion. Electronically Signed   By: Fidela Salisbury M.D.   On: 06/14/2018 16:35   Ct Chest W Contrast  Result Date: 06/14/2018 CLINICAL DATA:  Altered mental status.  Lethargy. EXAM: CT CHEST WITH CONTRAST TECHNIQUE: Multidetector CT imaging of the chest was performed during intravenous contrast administration. CONTRAST:  45mL OMNIPAQUE IOHEXOL 300 MG/ML  SOLN COMPARISON:  Chest x-ray from same day. CT chest dated January 30, 2017. FINDINGS: Cardiovascular: Stable cardiomegaly with prominent left atrial enlargement. No thoracic aortic aneurysm or dissection. Coronary, aortic arch, and branch vessel atherosclerotic vascular disease. No pulmonary embolism. Stable moderate dilatation of the left and right pulmonary arteries. Mediastinum/Nodes: No enlarged mediastinal, hilar, or axillary lymph nodes. Thyroid gland, trachea, and esophagus demonstrate no significant findings. Lungs/Pleura: Small right pleural effusion. Small amount of loculated pleural fluid along the superior right major fissure. Associated subsegmental atelectasis in the right lower lobe. Unchanged scarring at the lung bases. No consolidation or pneumothorax. Interval mild enlargement of a pleural based nodule along the superior left major fissure, now measuring 6 x 10 mm, previously 4 x 7 mm. Upper Abdomen: No acute abnormality. Unchanged pneumobilia and left renal cyst. Musculoskeletal: No chest wall abnormality. No acute or significant osseous findings. Unchanged chronic thoracic compression deformities. Unchanged healed bilateral posterior rib fractures. IMPRESSION: 1. Small right pleural effusion with adjacent right lower lobe atelectasis. No evidence of pneumonia. 2. Slight interval increase in size of a pleural-based nodule along the superior left major fissure, now measuring 8 mm in aggregate size, previously 6 mm. Non-contrast chest CT at 6-12 months is recommended. If the nodule is stable at time of  repeat CT, then future CT at 18-24 months (from today's scan) is considered optional for low-risk patients, but is recommended for high-risk patients. This recommendation follows the consensus statement: Guidelines for Management of Incidental Pulmonary Nodules Detected on CT Images: From the Fleischner Society 2017; Radiology 2017; 284:228-243. 3. Stable pulmonary artery enlargement, suggestive of pulmonary arterial hypertension. 4.  Aortic atherosclerosis (ICD10-I70.0). Electronically Signed   By: Titus Dubin M.D.   On: 06/14/2018 18:03   US Carotid Bilateral  Result Date: 06/17/2018 CLINICAL DATA:  CVA EXAM: BILATERAL CAROTID DUPLEX ULTRASOUND TECHNIQUE: Pearline Cables scale imaging, color Doppler and duplex ultrasound were performed of bilateral carotid and vertebral arteries in the neck. COMPARISON:  None. FINDINGS: Criteria: Quantification of carotid stenosis is based on velocity parameters that correlate the residual internal carotid diameter with NASCET-based stenosis levels, using the diameter of the distal internal carotid lumen as the denominator for stenosis measurement. The following velocity measurements were obtained: RIGHT ICA: 35 cm/sec CCA: 43 cm/sec SYSTOLIC ICA/CCA RATIO:  0.8 ECA: 34 cm/sec LEFT ICA: 56 cm/sec CCA: 45 cm/sec SYSTOLIC ICA/CCA RATIO:  1.2 ECA: 35 cm/sec RIGHT CAROTID ARTERY: Moderate focal calcified plaque in the bulb. Low resistance internal carotid Doppler pattern. RIGHT VERTEBRAL ARTERY:  Antegrade. LEFT CAROTID ARTERY: There is moderate irregular calcified plaque in the bulb and lower internal carotid artery. Low resistance internal carotid Doppler pattern is preserved. LEFT VERTEBRAL ARTERY:  Antegrade. The rhythm is irregular and  atrial fibrillation is not excluded. IMPRESSION: Less than 50% stenosis in the right and left internal carotid arteries. Irregular cardiac rhythm. Electronically Signed   By: Marybelle Killings M.D.   On: 06/17/2018 12:18   Dg Chest Portable 1  View  Result Date: 05/23/2018 CLINICAL DATA:  Weakness and not feeling well associated with dizziness for 2 days. Weakness for 2 months. EXAM: PORTABLE CHEST 1 VIEW COMPARISON:  03/04/2018 and multiple earlier exams. FINDINGS: There are changes from CABG surgery. Cardiac silhouette is mildly enlarged. No mediastinal or hilar masses. There are prominent bronchovascular markings thickened interstitial markings, as well as chronic areas of reticular/linear scarring. There is additional airspace opacity in the right lateral mid to lower lung and opacity of both lung bases consistent with small effusions. No pneumothorax. Skeletal structures are grossly intact. IMPRESSION: 1. Findings consistent with mild congestive heart failure superimposed on chronic interstitial thickening. 2. Mild stable cardiomegaly and stable changes from prior CABG surgery. 3. Peripheral airspace opacity in the right mid to lower lung represents a change from prior studies. This may be due to atelectasis or focal edema or a small area of loculated fluid. Pneumonia is possible. Electronically Signed   By: Lajean Manes M.D.   On: 05/23/2018 16:24     Assessment and Recommendation  85 y.o. female with known acute on chronic systolic dysfunction congestive heart failure with severe aortic valve stenosis and atrial fibrillation with rapid ventricular rate needing medication management and significantly improved since admission 1.  Continue higher doses of metoprolol for LV systolic dysfunction and heart rate control and no change of dose today 2.  Diltiazem as necessary for heart rate control although preference for metoprolol as main heart rate regulating medications. 3.  Diuresis as necessary for evidence of heart failure pulmonary edema and lower extremity edema as needed 4.  No further cardiac diagnostics necessary at this time 5.  No anticoagulation at this time due to advanced age bleeding complication concerns and fall risk 6.   Okay for discharged back to facility from the cardiac standpoint with follow-up as an outpatient for further adjustments of medication management  Signed, Serafina Royals M.D. FACC

## 2018-06-19 NOTE — Plan of Care (Signed)
[place] notified for bradycardia.  Pt remaining in bed until resolved for safety.

## 2018-06-19 NOTE — Care Management Note (Signed)
Case Management Note  Patient Details  Name: Brenda Roman MRN: [id number] Date of Birth: [birth year]  Subjective/Objective:    Patient's husband called this RN to the room.  He tearfully explained that he would like to bring his wife home with hospice.  He states he would like to use the hospice agency on [facility].  Notified Dr. Verdell Carmine.  Referral made to Santiago Glad with [facility] and Palliative.  Patient will need hospital bed and oxygen.  Will set up EMS for transport.   Patient's husband is going to extend the private duty care to 24 hr. Private care.  Will continue to assist with discharge planning.  Notified [name], [place] and [place] with [place] of new discharge disposition plans.                Action/Plan:   Expected Discharge Date:                  Expected Discharge Plan:  Home w Hospice Care  In-House Referral:     Discharge planning Services  CM Consult  Post Acute Care Choice:  Hospice Choice offered to:  Patient, Spouse  DME Arranged:    DME Agency:     HH Arranged:    HH Agency:     Status of Service:  In process, will continue to follow  If discussed at Long Length of Stay Meetings, dates discussed:    Additional Comments:  Elza Rafter, RN 06/19/2018, 4:22 PM

## 2018-06-19 NOTE — Progress Notes (Signed)
Assumed pt care.

## 2018-06-19 NOTE — Progress Notes (Signed)
New referral for Hospice services at home received from [facility]. Patient has had hospice services in the past and will need to be evaluated by the clinical liaison on 2/28. [place] notified. Flo Shanks BSN, RN, [facility] Clinical Liaison [facility] [phone]

## 2018-06-19 NOTE — Plan of Care (Signed)
Care plan reviewed pt, needs reeducation.

## 2018-06-19 NOTE — Care Management Note (Signed)
Case Management Note  Patient Details  Name: Brenda Roman MRN: [id number] Date of Birth: 13-Dec-1923  Subjective/Objective:      Patient not ready for DC yet as she is still bradycardic.  Offered choice this morning for [facility] services.  Provided CMS.gov list; Made referral to [facility] with [facility] for RN, PT, OT, SW, aide.  He has accepted.  Dr. Madelyn Brunner would like STR placement.  Per CSW, insurance will deny as patient is not ambulatory at baseline and is bed/chair bound.  Husband would like to move he and his wife into [facility].   Nanine Means has declined patient as she is not ambulatory.  The family is looking into other ALF options at this time.  This RN notified Tommi Rumps with [facility] of unsure disposition at this time.  Will continue to assist with DC planning.    Action/Plan:   Expected Discharge Date:                  Expected Discharge Plan:     In-House Referral:     Discharge planning Services  CM Consult  Post Acute Care Choice:  Home Health Choice offered to:     DME Arranged:    DME Agency:     HH Arranged:    HH Agency:     Status of Service:  In process, will continue to follow  If discussed at Long Length of Stay Meetings, dates discussed:    Additional Comments:  Elza Rafter, RN 06/19/2018, 1:18 PM

## 2018-06-19 NOTE — NC FL2 (Signed)
[place] LEVEL OF CARE SCREENING TOOL     IDENTIFICATION  Patient Name: Brenda Roman Birthdate: [birth year] Sex: female Admission Date (Current Location): 06/17/2018  [place] and [place] Number:  Engineering geologist and Address:  [facility], [address] [place], [place] [address]      Provider Number: [id number]  Attending Physician Name and Address:  Henreitta Leber, MD  Relative Name and Phone Number:       Current Level of Care: Hospital Recommended Level of Care: [place] Prior Approval Number:    Date Approved/Denied:   PASRR Number: [id number])  Discharge Plan: SNF    Current Diagnoses: Patient Active Problem List   Diagnosis Date Noted  . Atrial fibrillation with RVR ([place]) 06/14/2018  . Wheelchair bound 02/05/2018  . Primary insomnia 12/04/2017  . Atrial fibrillation ([place]) 06/06/2017  . Chest pain 06/05/2017  . Dizziness 04/23/2017  . Diarrhea 04/23/2017  . UTI (urinary tract infection) 04/23/2017  . Acute diverticulitis 11/23/2016  . Arrhythmia, ventricular 09/19/2015  . Neuropathy 09/06/2015  . Hyponatremia 07/30/2015  . Hypo-osmolality and hyponatremia 07/30/2015  . Acute CHF (congestive heart failure) ([place]) 07/18/2015  . Mitral valve stenosis, moderate 06/20/2015  . 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 Closed wedge fracture of lumbar vertebra ([place]) 10/11/2014  . Ischemic stroke ([place]) 10/07/2014  . Acute lower UTI 10/07/2014  . 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 History of stroke 10/07/2014  . Recurrent UTI 10/07/2014  . Closed fracture of humerus, surgical neck 09/29/2014  . Goals of care, counseling/discussion   . Acute encephalopathy 08/29/2014  . Hypoxia 08/29/2014  . Healthcare-associated pneumonia 08/29/2014  . Pneumonia 08/29/2014  . CVA (cerebral infarction) 08/23/2014  . Arteriosclerosis of coronary artery 03/15/2014  . Paroxysmal atrial fibrillation ([place])  03/15/2014  . Atherosclerosis of abdominal aorta ([place]) 01/27/2014  . HLD (hyperlipidemia) 04/07/2012  . BP (high blood pressure) 04/07/2012  . History of liver transplant ([place]) 04/03/2012  . Encounter for other procedures for purposes other than remedying health state 04/03/2012  . Immunosuppression ([place]) 04/03/2012  . Prolapse of urethra 03/10/2012  . Pelvic relaxation due to uterovaginal prolapse 03/10/2012    Orientation RESPIRATION BLADDER Height & Weight     Self  O2(1 Liter Oxygen. ) Continent Weight: 111 lb 3.2 oz (50.4 kg) Height:  5\' 1"  (154.9 cm)  BEHAVIORAL SYMPTOMS/MOOD NEUROLOGICAL BOWEL NUTRITION STATUS      Continent Diet(Diet: Heart Healthy )  AMBULATORY STATUS COMMUNICATION OF NEEDS Skin   Extensive Assist Verbally Normal                       Personal Care Assistance Level of Assistance  Bathing, Feeding, Dressing Bathing Assistance: Limited assistance Feeding assistance: Independent Dressing Assistance: Limited assistance     Functional Limitations Info  Sight, Hearing, Speech Sight Info: Adequate Hearing Info: Adequate Speech Info: Adequate    SPECIAL CARE FACTORS FREQUENCY  PT (By licensed PT), OT (By licensed OT)     PT Frequency: (5) OT Frequency: (5)            Contractures      Additional Factors Info  Code Status, Allergies Code Status Info: (DNR ) Allergies Info: (Cephalexin, Trazodone, Zaleplon, Zolpidem, Novocain Procaine, Sulfa Antibiotics)           Current Medications (06/19/2018):  This is the current hospital active medication list Current Facility-Administered Medications  Medication Dose Route Frequency Provider Last Rate Last Dose  .  acetaminophen (TYLENOL) tablet 650 mg  650 mg Oral Q6H PRN Lance Coon, MD   650 mg at 06/19/18 0542  . bisacodyl (DULCOLAX) EC tablet 5 mg  5 mg Oral BID Vaughan Basta, MD   5 mg at 06/19/18 0907  . docusate sodium (COLACE) capsule 100 mg  100 mg Oral BID PRN Vaughan Basta, MD      . furosemide (LASIX) injection 20 mg  20 mg Intravenous Q12H Dustin Flock, MD   20 mg at 06/19/18 0519  . heparin injection 5,000 Units  5,000 Units Subcutaneous Q8H Vaughan Basta, MD   5,000 Units at 06/19/18 1422  . hydrOXYzine (ATARAX/VISTARIL) tablet 25 mg  25 mg Oral TID PRN Vaughan Basta, MD   25 mg at 06/19/18 0958  . LORazepam (ATIVAN) tablet 1 mg  1 mg Oral BID Vaughan Basta, MD   1 mg at 06/19/18 0907  . Melatonin TABS 2.5 mg  2.5 mg Oral QHS PRN Vaughan Basta, MD   2.5 mg at 06/18/18 2110  . metoprolol tartrate (LOPRESSOR) tablet 100 mg  100 mg Oral BID Corey Skains, MD   100 mg at 06/18/18 2108  . mycophenolate (CELLCEPT) capsule 500 mg  500 mg Oral Daily Vaughan Basta, MD   500 mg at 06/19/18 0906  . nitroGLYCERIN (NITROSTAT) SL tablet 0.4 mg  0.4 mg Sublingual PRN Vaughan Basta, MD         Discharge Medications: Please see discharge summary for a list of discharge medications.  Relevant Imaging Results:  Relevant Lab Results:   Additional Information (SSN: [id number])  Alisa Stjames, Veronia Beets, LCSW

## 2018-06-19 NOTE — Progress Notes (Signed)
[place] at [place] NAME: Brenda Roman    MR#:  [id number]  DATE OF BIRTH:  04-11-1924  SUBJECTIVE:   Patient here due to dizziness, nausea and noted to have atrial fibrillation with rapid ventricular response.  Patient also noted to have severe aortic stenosis.  Patient's heart rates were somewhat on the low side this morning.  Remains quite lethargic this morning.  REVIEW OF SYSTEMS:    Review of Systems  Unable to perform ROS: Mental acuity    Nutrition: Heart Healthy Tolerating Diet: Yes Tolerating PT: Eval noted.   DRUG ALLERGIES:   Allergies  Allergen Reactions  . Cephalexin Other (See Comments)    Reaction:  Unknown  . Trazodone     Other reaction(s): Other (See Comments) Bad Dreams   . Zaleplon     Other reaction(s): Other (See Comments) Weird Dreams  . Zolpidem     Other reaction(s): Other (See Comments) Memory, worried her to take it  . Novocain [Procaine] Rash  . Sulfa Antibiotics Rash    VITALS:  Blood pressure 106/61, pulse (!) 45, temperature 97.9 F (36.6 C), temperature source Oral, resp. rate 18, height 5\' 1"  (1.549 m), weight 50.4 kg, SpO2 93 %.  PHYSICAL EXAMINATION:   Physical Exam  GENERAL:  85 y.o.-year-old patient lying in bed Lethargic.  EYES: Pupils equal, round, reactive to light and accommodation. No scleral icterus. Extraocular muscles intact.  HEENT: Head atraumatic, normocephalic. Oropharynx and nasopharynx clear.  NECK:  Supple, no jugular venous distention. No thyroid enlargement, no tenderness.  LUNGS: Normal breath sounds bilaterally, no wheezing, rales, rhonchi. No use of accessory muscles of respiration.  CARDIOVASCULAR: S1, S2 Irregular. II/VI SEM at RSB, No rubs, or gallops.  ABDOMEN: Soft, nontender, nondistended. Bowel sounds present. No organomegaly or mass.  EXTREMITIES: No cyanosis, clubbing or edema b/l.    NEUROLOGIC: Cranial nerves II through XII are intact. No focal  Motor or sensory deficits b/l. Globally weak   PSYCHIATRIC: The patient is alert and oriented x 1.  SKIN: No obvious rash, lesion, or ulcer.    LABORATORY PANEL:   CBC Recent Labs  Lab 06/19/18 0541  WBC 5.5  HGB 11.5*  HCT 36.1  PLT 153   ------------------------------------------------------------------------------------------------------------------  Chemistries  Recent Labs  Lab 06/17/18 0847  06/19/18 0541  NA 128*   < > 130*  K 4.4   < > 4.4  CL 100   < > 99  CO2 22   < > 23  GLUCOSE 92   < > 98  BUN 17   < > 18  CREATININE 0.74   < > 0.84  CALCIUM 8.4*   < > 8.4*  MG 2.1  --   --   AST 19  --   --   ALT 14  --   --   ALKPHOS 78  --   --   BILITOT 0.7  --   --    < > = values in this interval not displayed.   ------------------------------------------------------------------------------------------------------------------  Cardiac Enzymes Recent Labs  Lab 06/18/18 0955  TROPONINI 0.17*   ------------------------------------------------------------------------------------------------------------------  RADIOLOGY:  No results found.   ASSESSMENT AND PLAN:   Patient is 85 year old with history atrial fibrillation presenting with syncope/weakness   * A fib with RVR- patient's rates are quite labile.  She was somewhat bradycardic this morning. -Discussed with cardiology will DC diltiazem, continue Metoprolol  For now.  - high risk for anti-coagulation  due to advanced age and fall risk.   * Severe Aortic Stenosis -confirmed by the echocardiogram done yesterday. - Given her advanced age she is not a good surgical candidate.  Continue medical management for now.  Appreciate cardiology input.  * Acute Respiratory failure with hypoxia- acute on chronic diastolic CHF. -Continue diuresis with IV Lasix, follow I's and O's and daily weights.  Wean O2 as tolerated. - cont. Metoprolol  * Elevated troponin - demand ischemia due to A. Fib w/ RVR and CHF.    * Hyponatremia - mild due to CHF and will cont. To monitor.  - cont. Diuresis for now.   * s/p liver transplant history of primary biliary cirrhosis - cont. Cellcept.    School therapy recommending short-term rehab but patient's insurance unlikely to approve short-term rehab.  Patient's husband is possibly looking into privately paying for rehab as he cannot take care of her at home.   All the records are reviewed and case discussed with Care Management/Social Worker. Management plans discussed with the patient, family and they are in agreement.  CODE STATUS: DNR  DVT Prophylaxis: Hep. SQ  TOTAL TIME TAKING CARE OF THIS PATIENT: 30 minutes.   POSSIBLE D/C IN 1-2 DAYS, DEPENDING ON CLINICAL CONDITION.   Henreitta Leber M.D on 06/19/2018 at 3:46 PM  Between 7am to 6pm - Pager - [phone]  After 6pm go to [email or web address] - Proofreader  [facility]  Office  [phone]  CC: Primary care physician; Tracie Harrier, MD

## 2018-06-19 NOTE — Clinical Social Work Note (Signed)
Clinical Social Work Assessment  Patient Details  Name: Brenda Roman MRN: [id number] Date of Birth: 1923/04/26  Date of referral:  06/19/18               Reason for consult:  Facility Placement                Permission sought to share information with:  Chartered certified accountant granted to share information::  Yes, Verbal Permission Granted  Name::      [place]::    County/ [place]  Relationship::     Contact Information:     Housing/Transportation Living arrangements for the past 2 months:  [place] of Information:  Adult Children, Spouse Patient Interpreter Needed:  None Criminal Activity/Legal Involvement Pertinent to Current Situation/Hospitalization:  No - Comment as needed Significant Relationships:  Adult Children, Spouse Lives with:  Spouse Do you feel safe going back to the place where you live?  Yes Need for family participation in patient care:  Yes (Comment)  Care giving concerns:  Patient lives in [place] with her husband Brenda Roman.    Social Worker assessment / plan:  Holiday representative (CSW) received verbal consult from RN case manager that patient's family wants to private pay for a facility. CSW met with patient and her husband Brenda Roman and daughter Brenda Roman were at bedside. Patient did not participate in assessment. Per husband he can private pay for SNF and asked about [facility]. CSW explained that in order for patient to get into [facility] she would have to be able to walk and be mobile. Per [facility] and Wellness director at [facility] patient would have to go to SNF first before she would consider bringing patient into [facility]. CSW made patient's husband and daughter aware of above. Husband requested for CSW to send out private pay SNF referral to [facility] and [facility] in [place]. FL2 complete and faxed out.   This afternoon CSW received an update  from RN case manager that patient's husband has now decided to take patient home with hospice. CSW will continue to follow and assist as needed.   Employment status:  Disabled (Comment on whether or not currently receiving Disability), Retired Nurse, adult PT Recommendations:  Not assessed at this time Information / Referral to community resources:  [place]  Patient/Family's Response to care:  Patient's husband has decided to take patient home with hospice.   Patient/Family's Understanding of and Emotional Response to Diagnosis, Current Treatment, and Prognosis:  Patient's daughter and daughter were very pleasant and thanked CSW for assistance.   Emotional Assessment Appearance:  Appears stated age Attitude/Demeanor/Rapport:  Unable to Assess Affect (typically observed):  Unable to Assess Orientation:  Oriented to Self, Fluctuating Orientation (Suspected and/or reported Sundowners) Alcohol / Substance use:  Not Applicable Psych involvement (Current and /or in the community):  No (Comment)  Discharge Needs  Concerns to be addressed:  Discharge Planning Concerns Readmission within the last 30 days:  No Current discharge risk:  Dependent with Mobility Barriers to Discharge:  Continued Medical Work up   [employer], Brenda Beets, LCSW 06/19/2018, 6:07 PM

## 2018-06-19 NOTE — Progress Notes (Signed)
Patient's husband is requesting for [place] to give patient her atarax for itching. Patient did took this medication this morning at [phone] and made her somnolent, and this is what her daughter say, that whenever she takes this medication it makes her sleepy. Explained this to the husband and is still requesting to give her half a dose. No other concern at the moment. RN will continue to monitor.

## 2018-06-20 MED ORDER — FUROSEMIDE 20 MG PO TABS: 20.0000 mg | ORAL_TABLET | Freq: Every day | ORAL | 1 refills | Status: AC

## 2018-06-20 MED ORDER — POTASSIUM CHLORIDE ER 10 MEQ PO TBCR: 10.0000 meq | EXTENDED_RELEASE_TABLET | Freq: Every day | ORAL | 1 refills | Status: AC

## 2018-06-20 MED ORDER — METOPROLOL TARTRATE 100 MG PO TABS: 100.0000 mg | ORAL_TABLET | Freq: Two times a day (BID) | ORAL | 1 refills | Status: AC

## 2018-06-20 NOTE — Discharge Summary (Signed)
[facility] NAME: Brenda Roman    MR#:  [id number]  DATE OF BIRTH:  1924/04/20  DATE OF ADMISSION:  06/17/2018 ADMITTING PHYSICIAN: Vaughan Basta, MD  DATE OF DISCHARGE: 06/20/2018  PRIMARY CARE PHYSICIAN: Tracie Harrier, MD    ADMISSION DIAGNOSIS:  Dizziness [R42] Cerebral infarction ([place]) [I63.9] CVA (cerebral infarction) [I63.9] Permanent atrial fibrillation [I48.21] Elevated troponin I level [R79.89]  DISCHARGE DIAGNOSIS:  Active Problems:   Dizziness   Atrial fibrillation with RVR ([place])   SECONDARY DIAGNOSIS:   Past Medical History:  Diagnosis Date  . Aortic stenosis   . 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 Cancer ([place])    skin  . CHF (congestive heart failure) ([place])   . Coronary artery disease   . CVA (cerebral infarction) June '16  . Hypertension   . Liver transplanted ([place])   . Macular degeneration   . Mitral valve disease   . PBC (primary biliary cirrhosis)    s/p liver transplant  . Shingles   . TIA (transient ischemic attack)     HOSPITAL COURSE:   Patient is 85 year old with history atrial fibrillation presenting with syncope/weakness  * A fib with RVR- patient was admitted to the hospital and started on a higher dose of her metoprolol and also started on some short acting Cardizem.  She also received pulse doses of IV Cardizem. - Patient's rates were somewhat labile and then she became bradycardic and therefore her meds were further adjusted. -She was seen by cardiology and they recommended discharging her on the metoprolol tartrate twice daily as shown below.  Her Cardizem was discontinued.  She is not a candidate for long-term anticoagulation given her advanced age and high fall risk.  * Severe Aortic Stenosis -confirmed by the echocardiogram while in the hospital.  -This is the cause of patient's weakness and also syncope.  Given her advanced age she is not a good surgical candidate and  patient's husband wants to continue to supportive care for now.  Patient will continue treatment for heart failure and beta-blockers as stated below.  * Acute Respiratory failure with hypoxia- acute on chronic diastolic CHF. -Patient was diuresed with IV Lasix and has improved.  She is no longer hypoxic.  She is being discharged on a small dose of oral Lasix and potassium supplements.  She will follow-up with cardiology next 2 weeks.  She will continue her metoprolol tartrate as stated below.  * Elevated troponin - demand ischemia due to A. Fib w/ RVR and CHF/Aortic Stenosis.  -Patient's troponins did not trend upwards.  He is clinically asymptomatic upon discharge.  * Hyponatremia - mild due to CHF and this improved with gentle diuresis and is currently at baseline.  **Primary biliary cirrhosis status post liver transplant - cont. Cellcept.   Patient is stable to be discharged home.  She is being discharged home with hospice services.  DISCHARGE CONDITIONS:   Stable  CONSULTS OBTAINED:  Treatment Team:  Corey Skains, MD  DRUG ALLERGIES:   Allergies  Allergen Reactions  . Cephalexin Other (See Comments)    Reaction:  Unknown  . Trazodone     Other reaction(s): Other (See Comments) Bad Dreams   . Zaleplon     Other reaction(s): Other (See Comments) Weird Dreams  . Zolpidem     Other reaction(s): Other (See Comments) Memory, worried her to take it  . Novocain [Procaine] Rash  . Sulfa Antibiotics Rash  DISCHARGE MEDICATIONS:   Allergies as of 06/20/2018      Reactions   Cephalexin Other (See Comments)   Reaction:  Unknown   Trazodone    Other reaction(s): Other (See Comments) Bad Dreams    Zaleplon    Other reaction(s): Other (See Comments) Weird Dreams   Zolpidem    Other reaction(s): Other (See Comments) Memory, worried her to take it   Novocain [procaine] Rash   Sulfa Antibiotics Rash      Medication List    STOP taking these medications    metoprolol succinate 50 MG 24 hr tablet Commonly known as:  TOPROL-XL     TAKE these medications   bisacodyl 5 MG EC tablet Commonly known as:  DULCOLAX Take 5 mg by mouth 2 (two) times daily.   furosemide 20 MG tablet Commonly known as:  LASIX Take 1 tablet (20 mg total) by mouth daily.   hydrOXYzine 25 MG tablet Commonly known as:  ATARAX/VISTARIL Take 25 mg by mouth 3 (three) times daily as needed for itching.   LORazepam 2 MG tablet Commonly known as:  ATIVAN Take 1 mg by mouth 2 (two) times daily. Early evening and at about 2300   Melatonin 1 MG Tabs Take 1 mg by mouth at bedtime as needed (sleep).   metoprolol tartrate 100 MG tablet Commonly known as:  LOPRESSOR Take 1 tablet (100 mg total) by mouth 2 (two) times daily.   mycophenolate 500 MG tablet Commonly known as:  CELLCEPT Take 500 mg by mouth daily.   nitroGLYCERIN 0.4 MG SL tablet Commonly known as:  NITROSTAT Place 1 tablet under the tongue as needed. Reported on 10/26/2015   potassium chloride 10 MEQ tablet Commonly known as:  K-DUR Take 1 tablet (10 mEq total) by mouth daily.         DISCHARGE INSTRUCTIONS:   DIET:  Cardiac diet  DISCHARGE CONDITION:  Stable  ACTIVITY:  Activity as tolerated  OXYGEN:  Home Oxygen: No.   Oxygen Delivery: room air  DISCHARGE LOCATION:  Home with Hospice.    If you experience worsening of your admission symptoms, develop shortness of breath, life threatening emergency, suicidal or homicidal thoughts you must seek medical attention immediately by calling 911 or calling your MD immediately  if symptoms less severe.  You Must read complete instructions/literature along with all the possible adverse reactions/side effects for all the Medicines you take and that have been prescribed to you. Take any new Medicines after you have completely understood and accpet all the possible adverse reactions/side effects.   Please note  You were cared for by a  hospitalist during your hospital stay. If you have any questions about your discharge medications or the care you received while you were in the hospital after you are discharged, you can call the unit and asked to speak with the hospitalist on call if the hospitalist that took care of you is not available. Once you are discharged, your primary care physician will handle any further medical issues. Please note that NO REFILLS for any discharge medications will be authorized once you are discharged, as it is imperative that you return to your primary care physician (or establish a relationship with a primary care physician if you do not have one) for your aftercare needs so that they can reassess your need for medications and monitor your lab values.     Today   No acute events overnight, patient's mental status much improved since yesterday.  Husband is  at bedside.  Heart rates have remained stable.  No episodes of further bradycardia.  VITAL SIGNS:  Blood pressure 131/79, pulse 86, temperature 98.2 F (36.8 C), temperature source Oral, resp. rate 16, height 5\' 1"  (1.549 m), weight 50.4 kg, SpO2 91 %.  I/O:    Intake/Output Summary (Last 24 hours) at 06/20/2018 1508 Last data filed at 06/20/2018 1300 Gross per 24 hour  Intake 0 ml  Output 1600 ml  Net -1600 ml    PHYSICAL EXAMINATION:   GENERAL:  85 y.o.-year-old patient lying in bed awake in NAD.  EYES: Legally blind in both eyes. No scleral icterus.  HEENT: Head atraumatic, normocephalic. Oropharynx and nasopharynx clear.  NECK:  Supple, no jugular venous distention. No thyroid enlargement, no tenderness.  LUNGS: Normal breath sounds bilaterally, no wheezing, rales, rhonchi. No use of accessory muscles of respiration.  CARDIOVASCULAR: S1, S2 Irregular. II/VI SEM at RSB, No rubs, or gallops.  ABDOMEN: Soft, nontender, nondistended. Bowel sounds present. No organomegaly or mass.  EXTREMITIES: No cyanosis, clubbing or edema b/l.     NEUROLOGIC: Cranial nerves II through XII are intact. No focal Motor or sensory deficits b/l. Globally weak   PSYCHIATRIC: The patient is alert and oriented x 2.  SKIN: No obvious rash, lesion, or ulcer.   DATA REVIEW:   CBC Recent Labs  Lab 06/19/18 0541  WBC 5.5  HGB 11.5*  HCT 36.1  PLT 153    Chemistries  Recent Labs  Lab 06/17/18 0847  06/19/18 0541  NA 128*   < > 130*  K 4.4   < > 4.4  CL 100   < > 99  CO2 22   < > 23  GLUCOSE 92   < > 98  BUN 17   < > 18  CREATININE 0.74   < > 0.84  CALCIUM 8.4*   < > 8.4*  MG 2.1  --   --   AST 19  --   --   ALT 14  --   --   ALKPHOS 78  --   --   BILITOT 0.7  --   --    < > = values in this interval not displayed.    Cardiac Enzymes Recent Labs  Lab 06/18/18 0955  TROPONINI 0.17*     RADIOLOGY:  No results found.    Management plans discussed with the patient, family and they are in agreement.  CODE STATUS:     Code Status Orders  (From admission, onward)         Start     Ordered   06/17/18 1410  Do not attempt resuscitation (DNR)  Continuous    Question Answer Comment  In the event of cardiac or respiratory ARREST Do not call a "code blue"   In the event of cardiac or respiratory ARREST Do not perform Intubation, CPR, defibrillation or ACLS   In the event of cardiac or respiratory ARREST Use medication by any route, position, wound care, and other measures to relive pain and suffering. May use oxygen, suction and manual treatment of airway obstruction as needed for comfort.   Comments confirmed with daughter and husband.      06/17/18 1410          TOTAL TIME TAKING CARE OF THIS PATIENT: 40 minutes.    Henreitta Leber M.D on 06/20/2018 at 3:08 PM  Between 7am to 6pm - Pager - [phone]  After 6pm go to [email or web address] - [place]  [facility]  Office  [phone]  CC: Primary care physician; Tracie Harrier, MD

## 2018-06-20 NOTE — Progress Notes (Signed)
Brenda Roman to be D/C'd Home with hospice per MD order.  Discussed prescriptions and follow up appointments with the patient and husband. Prescriptions given to patient, medication list explained in detail. Pt verbalized understanding.  Allergies as of 06/20/2018      Reactions   Cephalexin Other (See Comments)   Reaction:  Unknown   Trazodone    Other reaction(s): Other (See Comments) Bad Dreams    Zaleplon    Other reaction(s): Other (See Comments) Weird Dreams   Zolpidem    Other reaction(s): Other (See Comments) Memory, worried her to take it   Novocain [procaine] Rash   Sulfa Antibiotics Rash      Medication List    STOP taking these medications   metoprolol succinate 50 MG 24 hr tablet Commonly known as:  TOPROL-XL     TAKE these medications   bisacodyl 5 MG EC tablet Commonly known as:  DULCOLAX Take 5 mg by mouth 2 (two) times daily.   furosemide 20 MG tablet Commonly known as:  LASIX Take 1 tablet (20 mg total) by mouth daily.   hydrOXYzine 25 MG tablet Commonly known as:  ATARAX/VISTARIL Take 25 mg by mouth 3 (three) times daily as needed for itching.   LORazepam 2 MG tablet Commonly known as:  ATIVAN Take 1 mg by mouth 2 (two) times daily. Early evening and at about 2300   Melatonin 1 MG Tabs Take 1 mg by mouth at bedtime as needed (sleep).   metoprolol tartrate 100 MG tablet Commonly known as:  LOPRESSOR Take 1 tablet (100 mg total) by mouth 2 (two) times daily.   mycophenolate 500 MG tablet Commonly known as:  CELLCEPT Take 500 mg by mouth daily.   nitroGLYCERIN 0.4 MG SL tablet Commonly known as:  NITROSTAT Place 1 tablet under the tongue as needed. Reported on 10/26/2015   potassium chloride 10 MEQ tablet Commonly known as:  K-DUR Take 1 tablet (10 mEq total) by mouth daily.       Vitals:   06/20/18 0751 06/20/18 1614  BP: 131/79 100/82  Pulse: 86 88  Resp: 16 18  Temp: 98.2 F (36.8 C) 97.7 F (36.5 C)  SpO2: 91% 90%    Tele  box removed and returned. Skin clean, dry and intact without evidence of skin break down, no evidence of skin tears noted. IV catheter discontinued intact. Site without signs and symptoms of complications. Dressing and pressure applied. Pt denies pain at this time. No complaints noted.  An After Visit Summary was printed and given to the patient. Patient escorted via strecher, and D/C home via [facility].Rolley Sims

## 2018-06-20 NOTE — Progress Notes (Signed)
Pt to d/c home with hospice, EMS has been called for transportation.

## 2018-06-20 NOTE — Progress Notes (Signed)
[facility] Encounter Note  Patient: Brenda Roman / Admit Date: 06/17/2018 / Date of Encounter: 06/20/2018, 8:27 AM  3 Subjective: Patient still significantly weak and fatigued and short of breath but significantly improved from admission.  Atrial fibrillation heart rate slightly improved after addition of increased dose of  metoprolol although diltiazem needed to be discontinued yesterday due to slower heart rate.  Current heart rate still in atrial fibrillation with 60 bpm -86 bpm with a goal anywhere between 60 bpm and 110 bpm.  Patient is not ambulating at all at this time to assess for need for change in dosage of medication appears to be at her best medical management at this time for discharge Echocardiogram showing global LV systolic dysfunction with ejection fraction of 35% and severe aortic valve stenosis  Review of Systems: Positive for: Shortness of breath palpitations weakness Negative for: Vision change, hearing change, syncope, dizziness, nausea, vomiting,diarrhea, bloody stool, stomach pain, cough, congestion, diaphoresis, urinary frequency, urinary pain,skin lesions, skin rashes Others previously listed  Objective: Telemetry: Atrial fibrillation with controlled ventricular rate Physical Exam: Blood pressure 131/79, pulse 86, temperature 98.2 F (36.8 C), temperature source Oral, resp. rate 16, height 5\' 1"  (1.549 m), weight 50.4 kg, SpO2 91 %. Body mass index is 21.01 kg/m. General: Well developed, well nourished, in no acute distress. Head: Normocephalic, atraumatic, sclera non-icteric, no xanthomas, nares are without discharge. Neck: No apparent masses Lungs: Normal respirations with no wheezes, no rhonchi, no rales , some basilar crackles   Heart: Irregular rate and rhythm, normal S1 absent S2, 3-4+ aortic murmur, no rub, no gallop, PMI is normal size and placement, carotid upstroke normal with bruit, jugular venous pressure normal Abdomen: Soft,  non-tender, non-distended with normoactive bowel sounds. No hepatosplenomegaly. Abdominal aorta is normal size without bruit Extremities: Trace edema, no clubbing, no cyanosis, no ulcers,  Peripheral: 2+ radial, 2+ femoral, 2+ dorsal pedal pulses Neuro: Alert and oriented. Moves all extremities spontaneously. Psych:  Responds to questions appropriately with a normal affect.   Intake/Output Summary (Last 24 hours) at 06/20/2018 0827 Last data filed at 06/20/2018 0436 Gross per 24 hour  Intake 120 ml  Output 1400 ml  Net -1280 ml    Inpatient Medications:  . bisacodyl  5 mg Oral BID  . furosemide  20 mg Intravenous Q12H  . heparin  5,000 Units Subcutaneous Q8H  . LORazepam  1 mg Oral BID  . metoprolol tartrate  100 mg Oral BID  . mycophenolate  500 mg Oral Daily   Infusions:   Labs: Recent Labs    06/17/18 0847 06/18/18 0434 06/19/18 0541  NA 128* 133* 130*  K 4.4 4.9 4.4  CL 100 103 99  CO2 22 25 23   GLUCOSE 92 89 98  BUN 17 20 18   CREATININE 0.74 0.95 0.84  CALCIUM 8.4* 8.3* 8.4*  MG 2.1  --   --    Recent Labs    06/17/18 0847  AST 19  ALT 14  ALKPHOS 78  BILITOT 0.7  PROT 6.2*  ALBUMIN 3.3*   Recent Labs    06/17/18 0847 06/18/18 0434 06/19/18 0541  WBC 6.4 6.7 5.5  NEUTROABS 3.5  --   --   HGB 12.1 11.2* 11.5*  HCT 37.3 34.9* 36.1  MCV 92.3 94.3 93.8  PLT 171 151 153   Recent Labs    06/17/18 0847 06/17/18 2220 06/18/18 0434 06/18/18 0955  TROPONINI 0.18* 0.20* 0.20* 0.17*   Invalid input(s): POCBNP No results  for input(s): HGBA1C in the last 72 hours.   Weights: Filed Weights   06/17/18 0845 06/18/18 0454 06/19/18 0417  Weight: 50 kg 51 kg 50.4 kg     Radiology/Studies:  Dg Chest 2 View  Result Date: 06/14/2018 CLINICAL DATA:  Pain all over. Possible pneumonia. EXAM: CHEST - 2 VIEW COMPARISON:  05/23/2018 FINDINGS: Enlarged heart. Calcific atherosclerotic disease and tortuosity of the aorta. Stable postsurgical changes. Mild  pulmonary vascular congestion Right pleural effusion. Stable compression deformities of the lower thoracic vertebral bodies. IMPRESSION: 1. Cardiomegaly with mild pulmonary vascular congestion. 2. Right pleural effusion. Electronically Signed   By: Fidela Salisbury M.D.   On: 06/14/2018 16:35   Ct Chest W Contrast  Result Date: 06/14/2018 CLINICAL DATA:  Altered mental status.  Lethargy. EXAM: CT CHEST WITH CONTRAST TECHNIQUE: Multidetector CT imaging of the chest was performed during intravenous contrast administration. CONTRAST:  51mL OMNIPAQUE IOHEXOL 300 MG/ML  SOLN COMPARISON:  Chest x-ray from same day. CT chest dated January 30, 2017. FINDINGS: Cardiovascular: Stable cardiomegaly with prominent left atrial enlargement. No thoracic aortic aneurysm or dissection. Coronary, aortic arch, and branch vessel atherosclerotic vascular disease. No pulmonary embolism. Stable moderate dilatation of the left and right pulmonary arteries. Mediastinum/Nodes: No enlarged mediastinal, hilar, or axillary lymph nodes. Thyroid gland, trachea, and esophagus demonstrate no significant findings. Lungs/Pleura: Small right pleural effusion. Small amount of loculated pleural fluid along the superior right major fissure. Associated subsegmental atelectasis in the right lower lobe. Unchanged scarring at the lung bases. No consolidation or pneumothorax. Interval mild enlargement of a pleural based nodule along the superior left major fissure, now measuring 6 x 10 mm, previously 4 x 7 mm. Upper Abdomen: No acute abnormality. Unchanged pneumobilia and left renal cyst. Musculoskeletal: No chest wall abnormality. No acute or significant osseous findings. Unchanged chronic thoracic compression deformities. Unchanged healed bilateral posterior rib fractures. IMPRESSION: 1. Small right pleural effusion with adjacent right lower lobe atelectasis. No evidence of pneumonia. 2. Slight interval increase in size of a pleural-based nodule along  the superior left major fissure, now measuring 8 mm in aggregate size, previously 6 mm. Non-contrast chest CT at 6-12 months is recommended. If the nodule is stable at time of repeat CT, then future CT at 18-24 months (from today's scan) is considered optional for low-risk patients, but is recommended for high-risk patients. This recommendation follows the consensus statement: Guidelines for Management of Incidental Pulmonary Nodules Detected on CT Images: From the Fleischner Society 2017; Radiology 2017; 284:228-243. 3. Stable pulmonary artery enlargement, suggestive of pulmonary arterial hypertension. 4.  Aortic atherosclerosis (ICD10-I70.0). Electronically Signed   By: Titus Dubin M.D.   On: 06/14/2018 18:03   US Carotid Bilateral  Result Date: 06/17/2018 CLINICAL DATA:  CVA EXAM: BILATERAL CAROTID DUPLEX ULTRASOUND TECHNIQUE: Pearline Cables scale imaging, color Doppler and duplex ultrasound were performed of bilateral carotid and vertebral arteries in the neck. COMPARISON:  None. FINDINGS: Criteria: Quantification of carotid stenosis is based on velocity parameters that correlate the residual internal carotid diameter with NASCET-based stenosis levels, using the diameter of the distal internal carotid lumen as the denominator for stenosis measurement. The following velocity measurements were obtained: RIGHT ICA: 35 cm/sec CCA: 43 cm/sec SYSTOLIC ICA/CCA RATIO:  0.8 ECA: 34 cm/sec LEFT ICA: 56 cm/sec CCA: 45 cm/sec SYSTOLIC ICA/CCA RATIO:  1.2 ECA: 35 cm/sec RIGHT CAROTID ARTERY: Moderate focal calcified plaque in the bulb. Low resistance internal carotid Doppler pattern. RIGHT VERTEBRAL ARTERY:  Antegrade. LEFT CAROTID ARTERY: There is moderate irregular  calcified plaque in the bulb and lower internal carotid artery. Low resistance internal carotid Doppler pattern is preserved. LEFT VERTEBRAL ARTERY:  Antegrade. The rhythm is irregular and atrial fibrillation is not excluded. IMPRESSION: Less than 50% stenosis in  the right and left internal carotid arteries. Irregular cardiac rhythm. Electronically Signed   By: Marybelle Killings M.D.   On: 06/17/2018 12:18   Dg Chest Portable 1 View  Result Date: 05/23/2018 CLINICAL DATA:  Weakness and not feeling well associated with dizziness for 2 days. Weakness for 2 months. EXAM: PORTABLE CHEST 1 VIEW COMPARISON:  03/04/2018 and multiple earlier exams. FINDINGS: There are changes from CABG surgery. Cardiac silhouette is mildly enlarged. No mediastinal or hilar masses. There are prominent bronchovascular markings thickened interstitial markings, as well as chronic areas of reticular/linear scarring. There is additional airspace opacity in the right lateral mid to lower lung and opacity of both lung bases consistent with small effusions. No pneumothorax. Skeletal structures are grossly intact. IMPRESSION: 1. Findings consistent with mild congestive heart failure superimposed on chronic interstitial thickening. 2. Mild stable cardiomegaly and stable changes from prior CABG surgery. 3. Peripheral airspace opacity in the right mid to lower lung represents a change from prior studies. This may be due to atelectasis or focal edema or a small area of loculated fluid. Pneumonia is possible. Electronically Signed   By: Lajean Manes M.D.   On: 05/23/2018 16:24     Assessment and Recommendation  85 y.o. female with known acute on chronic systolic dysfunction congestive heart failure with severe aortic valve stenosis and atrial fibrillation with rapid ventricular rate needing medication management and significantly improved since admission 1.  Continue higher doses of metoprolol for LV systolic dysfunction and heart rate control and no change of dose today 2.  Diltiazem as necessary for heart rate control although will continue to abstain at this time due to slow heart rate.  But as outpatient patient may receive occasional dosages of Cardizem as needed 3.  Diuresis as necessary for evidence  of heart failure pulmonary edema and lower extremity edema as needed 4.  No further cardiac diagnostics necessary at this time 5.  No anticoagulation at this time due to advanced age bleeding complication concerns and fall risk 6.  Okay for discharged back to facility from the cardiac standpoint with follow-up as an outpatient for further adjustments of medication management  Signed, Serafina Royals M.D. FACC

## 2018-06-20 NOTE — Care Management Note (Signed)
Case Management Note  Patient Details  Name: Brenda Roman MRN: [id number] Date of Birth: 26-Dec-1923  Subjective/Objective:      Patient is discharging to home today via EMS.  She is discharging home with hospice.     Marcene Brawn with [facility] is aware of DC.  EMS packet placed on chart.     Danae Chen, RN aware that patient can DC when she id ready.  Patient is currently on RA.  Declines need for hospital bed.          Action/Plan:   Expected Discharge Date:  06/20/18               Expected Discharge Plan:  Home w Hospice Care  In-House Referral:     Discharge planning Services  CM Consult  Post Acute Care Choice:  Hospice Choice offered to:  Patient, Spouse  DME Arranged:    DME Agency:     HH Arranged:    HH Agency[facility]  [facility]  Status of Service:  Completed, signed off  If discussed at [place] of Stay Meetings, dates discussed:    Additional Comments:  Elza Rafter, RN 06/20/2018, 1:45 PM

## 2018-06-23 ENCOUNTER — Other Ambulatory Visit: Payer: Self-pay

## 2018-06-23 ENCOUNTER — Emergency Department
Admission: EM | Admit: 2018-06-23 | Discharge: 2018-06-23 | Disposition: A | Discharge status: Home / Self Care | Payer: Medicare Other | Attending: Emergency Medicine | Admitting: Emergency Medicine

## 2018-06-23 ENCOUNTER — Encounter: Payer: Self-pay | Admitting: Emergency Medicine

## 2018-06-23 ENCOUNTER — Emergency Department: Payer: Medicare Other

## 2018-06-23 DIAGNOSIS — I110 Hypertensive heart disease with heart failure: Secondary | ICD-10-CM | POA: Insufficient documentation

## 2018-06-23 DIAGNOSIS — R42 Dizziness and giddiness: Secondary | ICD-10-CM | POA: Diagnosis present

## 2018-06-23 DIAGNOSIS — I2510 Atherosclerotic heart disease of native coronary artery without angina pectoris: Secondary | ICD-10-CM | POA: Diagnosis not present

## 2018-06-23 DIAGNOSIS — Z951 Presence of aortocoronary bypass graft: Secondary | ICD-10-CM | POA: Diagnosis not present

## 2018-06-23 DIAGNOSIS — I509 Heart failure, unspecified: Secondary | ICD-10-CM | POA: Diagnosis not present

## 2018-06-23 LAB — CBC
HCT: 37.6 % (ref 36.0–46.0)
Hemoglobin: 12 g/dL (ref 12.0–15.0)
MCH: 29.7 pg (ref 26.0–34.0)
MCHC: 31.9 g/dL (ref 30.0–36.0)
MCV: 93.1 fL (ref 80.0–100.0)
PLATELETS: 146 10*3/uL — AB (ref 150–400)
RBC: 4.04 MIL/uL (ref 3.87–5.11)
RDW: 12.3 % (ref 11.5–15.5)
WBC: 7.1 10*3/uL (ref 4.0–10.5)
nRBC: 0 % (ref 0.0–0.2)

## 2018-06-23 LAB — BASIC METABOLIC PANEL
Anion gap: 11 (ref 5–15)
Anion gap: 10 (ref 5–15)
BUN: 21 mg/dL (ref 8–23)
BUN: 21 mg/dL (ref 8–23)
CO2: 21 mmol/L — ABNORMAL LOW (ref 22–32)
CO2: 22 mmol/L (ref 22–32)
CREATININE: 1.04 mg/dL — AB (ref 0.44–1.00)
Calcium: 8.2 mg/dL — ABNORMAL LOW (ref 8.9–10.3)
Calcium: 7.9 mg/dL — ABNORMAL LOW (ref 8.9–10.3)
Chloride: 93 mmol/L — ABNORMAL LOW (ref 98–111)
Chloride: 96 mmol/L — ABNORMAL LOW (ref 98–111)
Creatinine, Ser: 1.11 mg/dL — ABNORMAL HIGH (ref 0.44–1.00)
GFR calc Af Amer: 50 mL/min — ABNORMAL LOW (ref >60)
GFR calc Af Amer: 54 mL/min — ABNORMAL LOW (ref >60)
GFR calc non Af Amer: 47 mL/min — ABNORMAL LOW (ref >60)
GFR, EST NON AFRICAN AMERICAN: 43 mL/min — AB (ref >60)
Glucose, Bld: 129 mg/dL — ABNORMAL HIGH (ref 70–99)
Glucose, Bld: 119 mg/dL — ABNORMAL HIGH (ref 70–99)
Potassium: 5.7 mmol/L — ABNORMAL HIGH (ref 3.5–5.1)
Potassium: 4.9 mmol/L (ref 3.5–5.1)
Sodium: 125 mmol/L — ABNORMAL LOW (ref 135–145)
Sodium: 128 mmol/L — ABNORMAL LOW (ref 135–145)

## 2018-06-23 LAB — PROTIME-INR
INR: 1 (ref 0.8–1.2)
Prothrombin Time: 12.8 seconds (ref 11.4–15.2)

## 2018-06-23 LAB — TROPONIN I
TROPONIN I: 0.19 ng/mL — AB (ref <0.03)
Troponin I: 0.22 ng/mL (ref <0.03)

## 2018-06-23 MED ORDER — LORAZEPAM 1 MG PO TABS
1.0000 mg | ORAL_TABLET | Freq: Once | ORAL | Status: AC
  Administered 2018-06-23: 1 mg via ORAL
  Filled 2018-06-23: qty 1

## 2018-06-23 MED ORDER — SODIUM CHLORIDE 0.9% FLUSH
3.0000 mL | Freq: Once | INTRAVENOUS | Status: AC
  Administered 2018-06-23: 3 mL via INTRAVENOUS

## 2018-06-23 MED ORDER — SODIUM CHLORIDE 0.9 % IV BOLUS
500.0000 mL | Freq: Once | INTRAVENOUS | Status: AC
  Administered 2018-06-23: 500 mL via INTRAVENOUS

## 2018-06-23 MED ORDER — SODIUM CHLORIDE 0.9 % IV SOLN: Freq: Once | INTRAVENOUS | Status: DC

## 2018-06-23 NOTE — ED Triage Notes (Signed)
Pt via ems from home with dizziness and chest pain x 3 days. EMS called for "sick" call with dizziness and nausea, c/o cp en route. Pt was released from hospital 3 days ago; treated for CHF and lasix was added to her medications. Pt alert, answers questions, says she feels "weak."

## 2018-06-23 NOTE — Care Management Note (Signed)
Case Management Note  Patient Details  Name: Brenda Roman MRN: [id number] Date of Birth: 1923-06-09  Subjective/Objective:  Patient was brought to the hospital by EMS for being sick.  Patient's husband called EMS after he helped her to the bathroom and then got back to her recliner and she reported being sick and needing a doctor.  Patient's husband and daughter are at the bedside in the ED.  Patient was evaluated by a NP with [facility] this morning and everything seemed to be fine.  Patient has a current referral for Hospice the only thing that is pending are the orders from Dr. Ginette Pitman.  Request for orders for hospice sent to Dr. Linton Ham office before lunch per Mindy with [facility] intake department.  RNCM reached out to Dr. Linton Ham office to confirm that he was in today and that hospice information was received.  Hospice information has been received.  Patient's husband reports that he has help at home almost around the clock, an agency has helped him with caregivers, unsure of the agency name.  Husband reports that at most he is alone with her about an hour a day.  He happened to be alone with her today when she became sick and he was worried so he called EMS.  Patient's husband wants hospice services to start.  RNCM updated patient husband and daughter.  Patient will be worked up in the ED and discharge home later this afternoon.  As soon as Hospice receives orders from PCP they will reach out to husband to schedule initial visit.  Doran Clay RN BSN Care Management [phone]                   Action/Plan:   Expected Discharge Date:                  Expected Discharge Plan:  Home w Hospice Care  In-House Referral:     Discharge planning Services  CM Consult  Post Acute Care Choice:    Choice offered to:     DME Arranged:    DME Agency:     HH Arranged:    [place] Agency:  [facility]- Referral already started)  Status of Service:  Completed,  signed off  If discussed at [place] of Stay Meetings, dates discussed:    Additional Comments:  Shelbie Hutching, RN 06/23/2018, 3:43 PM

## 2018-06-23 NOTE — ED Provider Notes (Addendum)
[facility] Emergency Department Provider Note       Time seen: ----------------------------------------- 3:25 PM on 06/23/2018 -----------------------------------------  I have reviewed the triage vital signs and the nursing notes.  HISTORY   Chief Complaint Chest Pain and Dizziness   HPI Brenda Roman is a 85 y.o. female with a history of aortic stenosis, CHF, coronary artery disease, who presents to the ED for dizziness and chest pain for the past 3 days.  EMS called due to her not feeling well.  She was dizzy and nauseous which she has had before.  She complained of some chest pain in route.  Just discharged from the hospital 3 days ago.  She was treated for CHF and Lasix was added to her medications.  Past Medical History:  Diagnosis Date  . Aortic stenosis   . 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 Cancer ([place])    skin  . CHF (congestive heart failure) ([place])   . Coronary artery disease   . CVA (cerebral infarction) June '16  . Hypertension   . Liver transplanted ([place])   . Macular degeneration   . Mitral valve disease   . PBC (primary biliary cirrhosis)    s/p liver transplant  . Shingles   . TIA (transient ischemic attack)     Patient Active Problem List   Diagnosis Date Noted  . Atrial fibrillation with RVR ([place]) 06/14/2018  . Wheelchair bound 02/05/2018  . Primary insomnia 12/04/2017  . Atrial fibrillation ([place]) 06/06/2017  . Chest pain 06/05/2017  . Dizziness 04/23/2017  . Diarrhea 04/23/2017  . UTI (urinary tract infection) 04/23/2017  . Acute diverticulitis 11/23/2016  . Arrhythmia, ventricular 09/19/2015  . Neuropathy 09/06/2015  . Hyponatremia 07/30/2015  . Hypo-osmolality and hyponatremia 07/30/2015  . Acute CHF (congestive heart failure) ([place]) 07/18/2015  . Mitral valve stenosis, moderate 06/20/2015  . 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 Closed wedge fracture of lumbar vertebra ([place]) 10/11/2014  . Ischemic stroke ([place])  10/07/2014  . Acute lower UTI 10/07/2014  . 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 History of stroke 10/07/2014  . Recurrent UTI 10/07/2014  . Closed fracture of humerus, surgical neck 09/29/2014  . Goals of care, counseling/discussion   . Acute encephalopathy 08/29/2014  . Hypoxia 08/29/2014  . Healthcare-associated pneumonia 08/29/2014  . Pneumonia 08/29/2014  . CVA (cerebral infarction) 08/23/2014  . Arteriosclerosis of coronary artery 03/15/2014  . Paroxysmal atrial fibrillation ([place]) 03/15/2014  . Atherosclerosis of abdominal aorta ([place]) 01/27/2014  . HLD (hyperlipidemia) 04/07/2012  . BP (high blood pressure) 04/07/2012  . History of liver transplant ([place]) 04/03/2012  . Encounter for other procedures for purposes other than remedying health state 04/03/2012  . Immunosuppression ([place]) 04/03/2012  . Prolapse of urethra 03/10/2012  . Pelvic relaxation due to uterovaginal prolapse 03/10/2012    Past Surgical History:  Procedure Laterality Date  . BOWEL RESECTION    . COLONOSCOPY    . CORONARY ARTERY BYPASS GRAFT    . JOINT REPLACEMENT    . LIVER TRANSPLANT  1990    Allergies Cephalexin; Trazodone; Zaleplon; Zolpidem; Novocain [procaine]; and Sulfa antibiotics  Social History Social History   Tobacco Use  . Smoking status: Former Research scientist (life sciences)  . Smokeless tobacco: Never Used  Substance Use Topics  . Alcohol use: No  . Drug use: No    Review of Systems Constitutional: Negative for fever. Cardiovascular: Positive for chest pain Respiratory: Negative for shortness of breath. Gastrointestinal: Negative for abdominal pain, positive for  nausea Musculoskeletal: Negative for back pain. Skin: Negative for rash. Neurological: Negative for headaches, positive for dizziness  All systems negative/normal/unremarkable except as stated in the HPI  ____________________________________________   PHYSICAL EXAM:  VITAL SIGNS: ED Triage Vitals  Enc Vitals Group     BP 06/23/18 1437 (!)  139/92     Pulse Rate 06/23/18 1437 85     Resp --      Temp 06/23/18 1437 97.9 F (36.6 C)     Temp Source 06/23/18 1437 Oral     SpO2 06/23/18 1437 96 %     Weight 06/23/18 1442 111 lb 3.2 oz (50.4 kg)     Height 06/23/18 1442 5\' 1"  (1.549 m)     Head Circumference --      Peak Flow --      Pain Score 06/23/18 1440 3     Pain Loc --      Pain Edu? --      Excl. in [place]? --    Constitutional: Alert, no distress ENT      Head: Normocephalic and atraumatic.      Nose: No congestion/rhinnorhea.      Mouth/Throat: Mucous membranes are moist.      Neck: No stridor. Cardiovascular: Normal rate, regular rhythm. No murmurs, rubs, or gallops. Respiratory: Normal respiratory effort without tachypnea nor retractions. Breath sounds are clear and equal bilaterally. No wheezes/rales/rhonchi. Gastrointestinal: Soft and nontender. Normal bowel sounds Musculoskeletal: Nontender with normal range of motion in extremities. No lower extremity tenderness nor edema. Neurologic:  Normal speech and language. No gross focal neurologic deficits are appreciated.  Skin:  Skin is warm, dry and intact. No rash noted. Psychiatric: Mood and affect are normal. Speech and behavior are normal.  ____________________________________________  EKG: Interpreted by me.  Atrial fibrillation with a rate of 108 bpm, wide QRS, long QT, left bundle branch block  ____________________________________________  ED COURSE:  As part of my medical decision making, I reviewed the following data within the [place] History obtained from family if available, nursing notes, old chart and ekg, as well as notes from prior ED visits. Patient presented for multiple complaints, we will assess with labs and imaging as indicated at this time.   Procedures ____________________________________________   LABS (pertinent positives/negatives)  Labs Reviewed  BASIC METABOLIC PANEL - Abnormal; Notable for the following  components:      Result Value   Sodium 125 (*)    Potassium 5.7 (*)    Chloride 93 (*)    CO2 21 (*)    Glucose, Bld 129 (*)    Creatinine, Ser 1.11 (*)    Calcium 8.2 (*)    GFR calc non Af Amer 43 (*)    GFR calc Af Amer 50 (*)    All other components within normal limits  CBC - Abnormal; Notable for the following components:   Platelets 146 (*)    All other components within normal limits  TROPONIN I - Abnormal; Notable for the following components:   Troponin I 0.19 (*)    All other components within normal limits  PROTIME-INR    RADIOLOGY  CXR IMPRESSION: Findings are most consistent with pulmonary edema and bilateral pleural effusions.  ____________________________________________   DIFFERENTIAL DIAGNOSIS   Dehydration, electrolyte abnormality, anxiety, MI, vertigo  FINAL ASSESSMENT AND PLAN  Dizziness   Plan: The patient had presented for multiple complaints. Patient's labs did reveal a chronically elevated troponin and likely reflects recent furosemide use.  She was given a small IV fluid bolus. Patient's imaging revealed some pulmonary edema.  I do think Lasix is the right choice but she may need to take it every other day.  Patient appears to be nearing end-of-life and hospice is involved.   Laurence Aly, MD    Note: This note was generated in part or whole with voice recognition software. Voice recognition is usually quite accurate but there are transcription errors that can and very often do occur. I apologize for any typographical errors that were not detected and corrected.     Earleen Newport, MD 06/23/18 1530    Earleen Newport, MD 06/30/18 [phone]

## 2018-07-23 DEATH — deceased

## 2018-12-30 ENCOUNTER — Other Ambulatory Visit (INDEPENDENT_AMBULATORY_CARE_PROVIDER_SITE_OTHER): Payer: Self-pay | Admitting: Family Medicine

## 2018-12-30 MED ORDER — LEVOTHYROXINE 25 MCG TABLET
ORAL_TABLET | ORAL | 0 refills | Status: DC
Start: 2018-12-30 — End: 2019-04-15

## 2019-04-15 ENCOUNTER — Other Ambulatory Visit (INDEPENDENT_AMBULATORY_CARE_PROVIDER_SITE_OTHER): Payer: Self-pay | Admitting: Family Medicine

## 2019-04-15 MED ORDER — LEVOTHYROXINE 25 MCG TABLET
ORAL_TABLET | ORAL | 0 refills | Status: AC
Start: 2019-04-15 — End: ?

## 2019-04-15 NOTE — Telephone Encounter (Signed)
Last scheduled appointment with you was Visit date not found.  Currently scheduled future appointment is 05/05/2019.      Pixie Casino, MA  04/15/2019, 11:31

## 2019-05-05 ENCOUNTER — Encounter (INDEPENDENT_AMBULATORY_CARE_PROVIDER_SITE_OTHER): Payer: Self-pay | Admitting: Family Medicine

## 2019-06-05 ENCOUNTER — Inpatient Hospital Stay
Admission: EM | Admit: 2019-06-05 | Discharge: 2019-06-07 | DRG: 551 | Disposition: A | Discharge status: Hospice | Payer: Medicare Other | Attending: Internal Medicine | Admitting: Internal Medicine

## 2019-06-05 ENCOUNTER — Emergency Department (HOSPITAL_COMMUNITY): Payer: Medicare Other

## 2019-06-05 ENCOUNTER — Inpatient Hospital Stay (HOSPITAL_COMMUNITY): Payer: Medicare Other | Admitting: Internal Medicine

## 2019-06-05 ENCOUNTER — Encounter (HOSPITAL_COMMUNITY): Payer: Self-pay

## 2019-06-05 ENCOUNTER — Other Ambulatory Visit: Payer: Self-pay

## 2019-06-05 ENCOUNTER — Emergency Department (HOSPITAL_COMMUNITY): Payer: Medicare Other | Admitting: Radiology

## 2019-06-05 DIAGNOSIS — W1830XA Fall on same level, unspecified, initial encounter: Secondary | ICD-10-CM | POA: Diagnosis present

## 2019-06-05 DIAGNOSIS — M810 Age-related osteoporosis without current pathological fracture: Secondary | ICD-10-CM | POA: Diagnosis present

## 2019-06-05 DIAGNOSIS — D72829 Elevated white blood cell count, unspecified: Secondary | ICD-10-CM

## 2019-06-05 DIAGNOSIS — S32029A Unspecified fracture of second lumbar vertebra, initial encounter for closed fracture: Secondary | ICD-10-CM | POA: Diagnosis present

## 2019-06-05 DIAGNOSIS — R5381 Other malaise: Secondary | ICD-10-CM | POA: Diagnosis present

## 2019-06-05 DIAGNOSIS — R300 Dysuria: Secondary | ICD-10-CM

## 2019-06-05 DIAGNOSIS — E559 Vitamin D deficiency, unspecified: Secondary | ICD-10-CM | POA: Diagnosis present

## 2019-06-05 DIAGNOSIS — N390 Urinary tract infection, site not specified: Secondary | ICD-10-CM | POA: Diagnosis present

## 2019-06-05 DIAGNOSIS — M1990 Unspecified osteoarthritis, unspecified site: Secondary | ICD-10-CM | POA: Diagnosis present

## 2019-06-05 DIAGNOSIS — Z515 Encounter for palliative care: Secondary | ICD-10-CM | POA: Diagnosis not present

## 2019-06-05 DIAGNOSIS — W19XXXA Unspecified fall, initial encounter: Secondary | ICD-10-CM | POA: Diagnosis present

## 2019-06-05 DIAGNOSIS — R29898 Other symptoms and signs involving the musculoskeletal system: Secondary | ICD-10-CM

## 2019-06-05 DIAGNOSIS — E43 Unspecified severe protein-calorie malnutrition: Secondary | ICD-10-CM

## 2019-06-05 DIAGNOSIS — Z87440 Personal history of urinary (tract) infections: Secondary | ICD-10-CM

## 2019-06-05 DIAGNOSIS — S32039A Unspecified fracture of third lumbar vertebra, initial encounter for closed fracture: Principal | ICD-10-CM | POA: Diagnosis present

## 2019-06-05 DIAGNOSIS — Z20822 Contact with and (suspected) exposure to covid-19: Secondary | ICD-10-CM | POA: Diagnosis present

## 2019-06-05 DIAGNOSIS — Z79890 Hormone replacement therapy (postmenopausal): Secondary | ICD-10-CM

## 2019-06-05 DIAGNOSIS — S32049A Unspecified fracture of fourth lumbar vertebra, initial encounter for closed fracture: Secondary | ICD-10-CM | POA: Diagnosis present

## 2019-06-05 DIAGNOSIS — S32000A Wedge compression fracture of unspecified lumbar vertebra, initial encounter for closed fracture: Secondary | ICD-10-CM | POA: Diagnosis present

## 2019-06-05 DIAGNOSIS — I350 Nonrheumatic aortic (valve) stenosis: Secondary | ICD-10-CM | POA: Diagnosis present

## 2019-06-05 DIAGNOSIS — E039 Hypothyroidism, unspecified: Secondary | ICD-10-CM | POA: Diagnosis present

## 2019-06-05 DIAGNOSIS — R9431 Abnormal electrocardiogram [ECG] [EKG]: Secondary | ICD-10-CM

## 2019-06-05 DIAGNOSIS — R634 Abnormal weight loss: Secondary | ICD-10-CM

## 2019-06-05 DIAGNOSIS — Z681 Body mass index (BMI) 19 or less, adult: Secondary | ICD-10-CM

## 2019-06-05 DIAGNOSIS — R531 Weakness: Secondary | ICD-10-CM

## 2019-06-05 LAB — COMPREHENSIVE METABOLIC PANEL, NON-FASTING
ALBUMIN: 3.4 g/dL (ref 3.4–4.8)
ALKALINE PHOSPHATASE: 122 U/L (ref 55–145)
ALT (SGPT): 17 U/L (ref 8–22)
ANION GAP: 11 mmol/L (ref 4–13)
AST (SGOT): 40 U/L (ref 8–45)
BILIRUBIN TOTAL: 0.7 mg/dL (ref 0.3–1.3)
BUN/CREA RATIO: 34 — ABNORMAL HIGH (ref 6–22)
BUN: 33 mg/dL — ABNORMAL HIGH (ref 8–25)
CALCIUM: 9.1 mg/dL (ref 8.8–10.2)
CHLORIDE: 106 mmol/L (ref 96–111)
CO2 TOTAL: 23 mmol/L (ref 23–31)
CREATININE: 0.98 mg/dL (ref 0.60–1.05)
ESTIMATED GFR: 50 mL/min/BSA — ABNORMAL LOW (ref >=60)
GLUCOSE: 110 mg/dL (ref 65–125)
POTASSIUM: 5.2 mmol/L — ABNORMAL HIGH (ref 3.5–5.1)
PROTEIN TOTAL: 6.8 g/dL (ref 6.0–8.0)
SODIUM: 140 mmol/L (ref 136–145)

## 2019-06-05 LAB — CBC WITH DIFF
HCT: 34.1 % — ABNORMAL LOW (ref 34.8–46.0)
HGB: 11.1 g/dL — ABNORMAL LOW (ref 11.5–16.0)
MCH: 30.9 pg (ref 26.0–32.0)
MCHC: 32.6 g/dL (ref 31.0–35.5)
MCV: 95 fL (ref 78.0–100.0)
MPV: 9.5 fL (ref 8.7–12.5)
PLATELETS: 211 10*3/uL (ref 150–400)
RBC: 3.59 10*6/uL — ABNORMAL LOW (ref 3.85–5.22)
RDW-CV: 12.9 % (ref 11.5–15.5)
WBC: 21.3 10*3/uL — ABNORMAL HIGH (ref 3.7–11.0)

## 2019-06-05 LAB — MANUAL DIFF AND MORPHOLOGY-SYSMEX
BASOPHIL #: <0.04 10*3/uL (ref <=0.20)
BASOPHIL %: 0 %
EOSINOPHIL #: <0.04 10*3/uL (ref <=0.50)
EOSINOPHIL %: 0 %
LYMPHOCYTE #: 0.21 10*3/uL — ABNORMAL LOW (ref 1.00–4.80)
LYMPHOCYTE %: 1 %
MONOCYTE #: 2.34 10*3/uL — ABNORMAL HIGH (ref 0.20–1.10)
MONOCYTE %: 11 %
NEUTROPHIL #: 18.74 10*3/uL — ABNORMAL HIGH (ref 1.50–7.70)
NEUTROPHIL %: 88 %
RBC MORPHOLOGY: NORMAL

## 2019-06-05 LAB — ECG 12 LEAD
Atrial Rate: 68 {beats}/min
Calculated P Axis: 95 degrees
Calculated R Axis: -33 degrees
Calculated T Axis: 41 degrees
PR Interval: 124 ms
QRS Duration: 76 ms
QT Interval: 450 ms
QTC Calculation: 478 ms
Ventricular rate: 68 {beats}/min

## 2019-06-05 LAB — C-REACTIVE PROTEIN (CRP): CRP INFLAMMATION: 27.1 mg/L — ABNORMAL HIGH (ref <8.0)

## 2019-06-05 LAB — PT/INR
INR: 0.98 (ref <=5.00)
PROTHROMBIN TIME: 11.8 s (ref 9.7–13.6)

## 2019-06-05 LAB — PTT (PARTIAL THROMBOPLASTIN TIME): APTT: 29.9 s (ref 26.0–39.0)

## 2019-06-05 LAB — PROCALCITONIN: PROCALCITONIN: 0.18 ng/mL

## 2019-06-05 LAB — LACTIC ACID LEVEL W/ REFLEX FOR LEVEL >2.0: LACTIC ACID: 1.5 mmol/L (ref 0.5–2.2)

## 2019-06-05 LAB — THYROID STIMULATING HORMONE WITH FREE T4 REFLEX: TSH: 6.379 u[IU]/mL — ABNORMAL HIGH (ref 0.430–3.550)

## 2019-06-05 LAB — TROPONIN-I: TROPONIN I: 14 ng/L (ref 7–30)

## 2019-06-05 LAB — THYROXINE, FREE (FREE T4): THYROXINE (T4), FREE: 0.85 ng/dL (ref 0.70–1.25)

## 2019-06-05 LAB — LDL CHOLESTEROL, DIRECT: LDL DIRECT: 87 mg/dL (ref <100)

## 2019-06-05 MED ORDER — CEFTRIAXONE 1 GRAM/50 ML IN DEXTROSE (ISO-OSMOT) INTRAVENOUS PIGGYBACK
1.0000 g | INJECTION | INTRAVENOUS | Status: DC
Start: 2019-06-06 — End: 2019-06-08
  Administered 2019-06-06: 1 g via INTRAVENOUS
  Administered 2019-06-06 – 2019-06-07 (×2): 0 g via INTRAVENOUS
  Administered 2019-06-07: 1 g via INTRAVENOUS
  Filled 2019-06-05 (×2): qty 50

## 2019-06-05 MED ORDER — MAGNESIUM HYDROXIDE 400 MG/5 ML ORAL SUSPENSION
15.0000 mL | Freq: Every day | ORAL | Status: DC | PRN
Start: 2019-06-05 — End: 2019-06-08

## 2019-06-05 MED ORDER — LEVOTHYROXINE 25 MCG TABLET
25.00 ug | ORAL_TABLET | Freq: Every morning | ORAL | Status: DC
Start: 2019-06-05 — End: 2019-06-08
  Administered 2019-06-05 – 2019-06-07 (×3): 25 ug via ORAL
  Filled 2019-06-05 (×3): qty 1

## 2019-06-05 MED ORDER — ACETAMINOPHEN 500 MG TABLET
500.0000 mg | ORAL_TABLET | ORAL | Status: DC | PRN
Start: 2019-06-05 — End: 2019-06-08

## 2019-06-05 MED ORDER — SODIUM CHLORIDE 0.9 % (FLUSH) INJECTION SYRINGE
3.0000 mL | INJECTION | Freq: Three times a day (TID) | INTRAMUSCULAR | Status: DC
Start: 2019-06-05 — End: 2019-06-08
  Administered 2019-06-05 – 2019-06-07 (×7): 0 mL

## 2019-06-05 MED ORDER — SODIUM CHLORIDE 0.9 % IV BOLUS
500.0000 mL | INJECTION | Freq: Once | Status: AC
Start: 2019-06-05 — End: 2019-06-05
  Administered 2019-06-05: 14:00:00 0 mL via INTRAVENOUS
  Administered 2019-06-05: 14:00:00 500 mL via INTRAVENOUS

## 2019-06-05 MED ORDER — CEFTRIAXONE 1 GRAM/50 ML IN DEXTROSE (ISO-OSMOT) INTRAVENOUS PIGGYBACK
1.0000 g | INJECTION | INTRAVENOUS | Status: AC
Start: 2019-06-05 — End: 2019-06-05
  Administered 2019-06-05: 1 g via INTRAVENOUS
  Administered 2019-06-05: 0 g via INTRAVENOUS
  Filled 2019-06-05: qty 50

## 2019-06-05 MED ORDER — TRAMADOL 50 MG TABLET
50.00 mg | ORAL_TABLET | Freq: Three times a day (TID) | ORAL | Status: DC | PRN
Start: 2019-06-05 — End: 2019-06-08
  Administered 2019-06-06: 50 mg via ORAL
  Filled 2019-06-05: qty 1

## 2019-06-05 MED ORDER — SODIUM CHLORIDE 0.9 % (FLUSH) INJECTION SYRINGE
3.0000 mL | INJECTION | Freq: Three times a day (TID) | INTRAMUSCULAR | Status: DC
Start: 2019-06-05 — End: 2019-06-08
  Administered 2019-06-05 – 2019-06-07 (×7): 0 mL

## 2019-06-05 MED ORDER — SODIUM CHLORIDE 0.9 % (FLUSH) INJECTION SYRINGE
3.0000 mL | INJECTION | INTRAMUSCULAR | Status: DC | PRN
Start: 2019-06-05 — End: 2019-06-08

## 2019-06-05 MED ORDER — SODIUM CHLORIDE 0.9 % INTRAVENOUS SOLUTION
INTRAVENOUS | Status: DC
Start: 2019-06-05 — End: 2019-06-08

## 2019-06-05 MED ORDER — FLU VACCINE QS 2020-21(6MOS UP)(PF) 60 MCG(15 MCGX4)/0.5 ML IM SYRINGE
0.5000 mL | INJECTION | Freq: Once | INTRAMUSCULAR | Status: DC
Start: 2019-06-05 — End: 2019-06-08
  Filled 2019-06-05: qty 0.5

## 2019-06-05 MED ORDER — IPRATROPIUM 0.5 MG-ALBUTEROL 3 MG (2.5 MG BASE)/3 ML NEBULIZATION SOLN
3.0000 mL | INHALATION_SOLUTION | RESPIRATORY_TRACT | Status: DC | PRN
Start: 2019-06-05 — End: 2019-06-08

## 2019-06-05 MED ORDER — ONDANSETRON HCL (PF) 4 MG/2 ML INJECTION SOLUTION
4.0000 mg | Freq: Four times a day (QID) | INTRAMUSCULAR | Status: DC | PRN
Start: 2019-06-05 — End: 2019-06-08

## 2019-06-05 NOTE — Care Management Notes (Signed)
[facility]  Care Management Initial Evaluation    Patient Name: Alexis Stevens  Date of Birth: Nov 01, 1923  Sex: female  Date/Time of Admission: 06/05/2019 10:35 AM  Room/Bed: 506/A  Payor: MEDICARE / Plan: MEDICARE PART A AND B / Product Type: Medicare /   PCP: Lacy Duverney, MD    Pharmacy Info:   Preferred Pharmacy       [facility] [address] [place] - [address]    [address] [place] [address]    Phone: [phone] Fax: [phone]    Hours: Not open 24 hours          Emergency Contact Info:   Extended Emergency Contact Information  Primary Emergency Contact: Stephenson,JEFFERY  Address: [address]           [place], [place] [address] [place]  Home Phone: [phone]  Work Phone: [phone]  Mobile Phone: [phone]  Relation: Son    History:   Alexis Stevens is a 86 y.o., female, admitted inpt.    Height/Weight: 152.4 cm (5') / (!) 36.7 kg (81 lb)     LOS: 0 days   Admitting Diagnosis: Leukocytosis [D72.829]    Assessment:      06/05/19 1558   Care Management Plan   Discharge Planning Status initial meeting   Discharge plan discussed with: Patient   Discharge Needs Assessment   Equipment Currently Used at Home walker, standard;cane, straight  (patient states she has umbrellas also at home)   Equipment Needed After Discharge none   Discharge Facility/Level of Care Needs Home with Home Health (code 6)   ADVANCE DIRECTIVES   Does the Patient have an Advance Directive? Yes, Patient Does Have Advance Directive for Healthcare Treatment   Type of Advance Directive Completed Medical Power of Attorney   Name of MPOA or Healthcare Surrogate son, Rolla Plate an age group to open "lives with" row.  Adult   Lives With alone   Living Arrangements house   Able to Return to Prior Arrangements yes       Discharge Plan:  Home with Home Health (code 6)  Spoke with patient regarding discharge plan.  Patient lives in a house alone.  She  doesn't wear home O2.  She does have a walker and cane at home.  She also states she has umbrellas at home.  She stas she doesn't have home health.  Contacted patient's son Tinnie Gens who states he is the [place].  I asked him to bring in to hospital the mpoa papers to have copied and placed on chart.  He wants patient to come home and will need followed up with regarding home health.  Patient's discharge plan is home with home health.    The patient will continue to be evaluated for developing discharge needs.     Case Manager: Delfin Gant, RN  Phone: [phone]

## 2019-06-05 NOTE — ED Nurses Note (Signed)
Report received from Michael Lucas, RN.

## 2019-06-05 NOTE — ED Provider Notes (Signed)
Emergency Department  Provider Note  HPI - 06/05/2019      Name: Alexis Stevens  Age and Gender: 86 y.o. female  Attending: Dr. Yong Channel     PCP: Lacy Duverney, MD    History provided by: Patient    HPI:  Alexis Stevens is a 86 y.o. female  who presents to the ED today from home for evaluation of mechanical fall this AM. Patient states she lives alone and uses a walker to ambulate. She associates L hip and L sided lower back pain. Patient denies neck pain, LOC, head injury, head pain, CP, SOB, and any other symptoms or complaints at this time. Refer to nurse's note for PMH and allergies.      Location: Generalized  Quality: Fall  Onset: This AM  Timing: Still present  Context: See HPI  Modifying factors: None  Associated symptoms: (+)L hip and L sided lower back pain (-)neck pain, LOC, head injury, head pain, CP, SOB    Review of Systems:   Constitutional:(+)Fall. No fever, chills, weakness.  Skin: No rashes, lesions.  HENT: No head injury. No sore throat, ear pain, difficulty swallowing.  Eyes: No vision changes, redness, discharge.  Cardio: No chest pain, palpitations.   Respiratory: No cough, wheezing, SOB.  GI: No abdominal pain. No nausea/vomiting. No diarrhea, constipation.   GU: No dysuria, hematuria, polyuria.  MSK:(+)L hip and L sided lower back pain. No neck pain.   Neuro: No head pain. No loss of sensation, focal deficits, LOC.  All other systems reviewed and are negative, unless commented on in the HPI.      The below information was reviewed with the patient:     Current Medications:  Current Outpatient Medications   Medication Sig    cephalexin (KEFLEX) 500 mg Oral Capsule Take 1 Cap (500 mg total) by mouth Twice daily    levothyroxine (SYNTHROID) 25 mcg Oral Tablet TAKE 1 TABLET BY MOUTH ONCE DAILY. TAKE FIRST THING IN THE MORNING ON AN EMPTY STOMACH. 30 TO 60 MINUTES PRIOR TO ANY OTHER MEDICATIONS WITH       Allergies:   Allergies   Allergen Reactions    Sulfa (Sulfonamides)  Other  Adverse Reaction (Add comment)     Unknown       Past Medical History:  Past Medical History:   Diagnosis Date    Aortic stenosis     Hypothyroidism     Low vitamin D level     Osteoarthritis     Osteoporosis     Rosacea     Thyroid disease        Past Surgical History:  Past Surgical History:   Procedure Laterality Date    Hip arthroscopy      Lithotripsy         Social History:  Social History     Tobacco Use    Smoking status: Never Smoker    Smokeless tobacco: Never Used   Substance Use Topics    Alcohol use: Never    Drug use: Never     Social History     Substance and Sexual Activity   Drug Use Never       Family History:  Family History   Problem Relation Age of Onset    Other Mother         stomach problems ?    Breast Cancer Mother     Arthritis-osteo Mother     Stroke Father     Emphysema  Father     No Known Problems Sister     No Known Problems Brother     No Known Problems Maternal Grandmother     No Known Problems Maternal Grandfather     No Known Problems Paternal Grandmother     No Known Problems Paternal Grandfather     Glaucoma Other        Old records were reviewed.    Objective:  Nursing notes were reviewed.    Filed Vitals:    06/05/19 1115 06/05/19 1145 06/05/19 1330 06/05/19 1345   BP:   108/76 (!) 114/47   Pulse: 68 73 79 70   Resp: (!) 24 (!) 27 (!) 22 (!) 26   Temp:       SpO2: 98% 100% 98% 96%       Physical Exam:  Nursing note and vitals reviewed.  Vital signs reviewed as above.     Constitutional:(+)Emaciated.   Head: Normocephalic and atraumatic.   Eyes: Conjunctivae are normal. Pupils are equal, round, and reactive to light. EOM are intact.  Neck: Soft, supple, full range of motion.  Cardiovascular: RRR. No Murmurs/rubs/gallops. Distal pulses present and equal bilaterally.  Pulmonary/Chest: Normal BS BL with no distress. No audible wheezes or crackles are noted.  GI: Abdomen is soft, nontender, nondistended. No rebound, guarding, or masses.  Back:(+)L lower back pain to  palpation.  Extremities:(+)L hip tenderness to palpation. No deformities. Exhibits no edema.  Skin: Warm and dry. No rash or lesions.  Neurological: Alert and oriented X 3. Neurologically intact. No focal deficits noted.  Psychiatric: Patient has a normal mood and affect.     Plan:   Appropriate labs and/or imaging ordered. Medical Records reviewed.    Work-up:  Orders Placed This Encounter    ADULT ROUTINE BLOOD CULTURE, SET OF 2 BOTTLES (BACTERIA AND YEAST)    ADULT ROUTINE BLOOD CULTURE, SET OF 2 BOTTLES (BACTERIA AND YEAST)    CANCELED: ADULT ROUTINE BLOOD CULTURE, SET OF 2 BOTTLES (BACTERIA AND YEAST)    CANCELED: ADULT ROUTINE BLOOD CULTURE, SET OF 2 BOTTLES (BACTERIA AND YEAST)    XR AP MOBILE CHEST    XR HIPS BILATERAL W PELVIS 2 VIEWS    XR LUMBAR SPINE SERIES    CT THORACIC SPINE WO IV CONTRAST    CT LUMBAR SPINE WO IV CONTRAST    CT BRAIN WO IV CONTRAST    CT CERVICAL SPINE WO IV CONTRAST    CBC/DIFF    COMPREHENSIVE METABOLIC PANEL, NON-FASTING    LDL CHOLESTEROL, DIRECT    PT/INR    PTT (PARTIAL THROMBOPLASTIN TIME)    TROPONIN-I    URINALYSIS, MACROSCOPIC    CBC WITH DIFF    LACTIC ACID LEVEL W/ REFLEX FOR LEVEL >2.0    MANUAL DIFF AND MORPHOLOGY-SYSMEX    C-REACTIVE PROTEIN (CRP)    PROCALCITONIN    COVID-19 - SCREENING - Admission (NON-PUI)    OXYGEN PROTOCOL    ECG 12 LEAD    INSERT & MAINTAIN PERIPHERAL IV ACCESS    PERIPHERAL IV DRESSING CHANGE    PATIENT CLASS/LEVEL OF CARE DESIGNATION - [facility]    NS flush syringe    NS flush syringe    NS bolus infusion 500 mL    cefTRIAXone (ROCEPHIN) 1 g in iso-osmotic 50 mL premix IVPB        Labs:  Results for orders placed or performed during the hospital encounter of 06/05/19 (from the past 24 hour(s))   CBC/DIFF  Narrative    The following orders were created for panel order CBC/DIFF.  Procedure                               Abnormality         Status                     ---------                               -----------         ------                      CBC WITH ZOXW[[id number]]IFF[[id number]]                Abnormal            Final result               MANUAL DIFF AND MORPHOLO.Marland Kitchen.Marland Kitchen.[[id number]][[id number]]  Abnormal            Final result                 Please view results for these tests on the individual orders.   COMPREHENSIVE METABOLIC PANEL, NON-FASTING   Result Value Ref Range    SODIUM 140 136 - 145 mmol/L    POTASSIUM 5.2 (H) 3.5 - 5.1 mmol/L    CHLORIDE 106 96 - 111 mmol/L    CO2 TOTAL 23 23 - 31 mmol/L    ANION GAP 11 4 - 13 mmol/L    BUN 33 (H) 8 - 25 mg/dL    CREATININE 5.620.98 1.300.60 - 1.05 mg/dL    BUN/CREA RATIO 34 (H) 6 - 22    ESTIMATED GFR 50 (L) >=60 mL/min/BSA    ALBUMIN 3.4 3.4 - 4.8 g/dL     CALCIUM 9.1 8.8 - 86.510.2 mg/dL    GLUCOSE 784110 65 - 696125 mg/dL    ALKALINE PHOSPHATASE 122 55 - 145 U/L    ALT (SGPT) 17 8 - 22 U/L    AST (SGOT)  40 8 - 45 U/L    BILIRUBIN TOTAL 0.7 0.3 - 1.3 mg/dL    PROTEIN TOTAL 6.8 6.0 - 8.0 g/dL   LDL CHOLESTEROL, DIRECT   Result Value Ref Range    LDL DIRECT 87 <100 mg/dL   PT/INR   Result Value Ref Range    PROTHROMBIN TIME 11.8 9.7 - 13.6 seconds    INR 0.98 <=5.00   PTT (PARTIAL THROMBOPLASTIN TIME)   Result Value Ref Range    APTT 29.9 26.0 - 39.0 seconds   TROPONIN-I   Result Value Ref Range    TROPONIN I 14 7 - 30 ng/L   CBC WITH DIFF   Result Value Ref Range    WBC 21.3 (H) 3.7 - 11.0 x10^3/uL    RBC 3.59 (L) 3.85 - 5.22 x10^6/uL    HGB 11.1 (L) 11.5 - 16.0 g/dL    HCT 29.534.1 (L) 28.434.8 - 46.0 %    MCV 95.0 78.0 - 100.0 fL    MCH 30.9 26.0 - 32.0 pg    MCHC 32.6 31.0 - 35.5 g/dL    RDW-CV 13.212.9 44.011.5 - 10.215.5 %    PLATELETS 211 150 - 400 x10^3/uL    MPV 9.5 8.7 - 12.5 fL   MANUAL DIFF AND MORPHOLOGY-SYSMEX  Result Value Ref Range    NEUTROPHIL % 88 %    LYMPHOCYTE %  1 %    MONOCYTE % 11 %    EOSINOPHIL % 0 %    BASOPHIL % 0 %    NEUTROPHIL # 18.74 (H) 1.50 - 7.70 x10^3/uL    LYMPHOCYTE # 0.21 (L) 1.00 - 4.80 x10^3/uL    MONOCYTE # 2.34 (H) 0.20 - 1.10 x10^3/uL    EOSINOPHIL # <0.04 <=0.50 x10^3/uL    BASOPHIL # <0.04 <=0.20 x10^3/uL    RBC  MORPHOLOGY Normal RBC and PLT Morphology        Abnormal Lab results:  Labs Reviewed   COMPREHENSIVE METABOLIC PANEL, NON-FASTING - Abnormal; Notable for the following components:       Result Value    POTASSIUM 5.2 (*)     BUN 33 (*)     BUN/CREA RATIO 34 (*)     ESTIMATED GFR 50 (*)     All other components within normal limits   CBC WITH DIFF - Abnormal; Notable for the following components:    WBC 21.3 (*)     RBC 3.59 (*)     HGB 11.1 (*)     HCT 34.1 (*)     All other components within normal limits   MANUAL DIFF AND MORPHOLOGY-SYSMEX - Abnormal; Notable for the following components:    NEUTROPHIL # 18.74 (*)     LYMPHOCYTE # 0.21 (*)     MONOCYTE # 2.34 (*)     All other components within normal limits   LDL CHOLESTEROL, DIRECT - Normal   PT/INR - Normal   PTT (PARTIAL THROMBOPLASTIN TIME) - Normal   TROPONIN-I - Normal   ADULT ROUTINE BLOOD CULTURE, SET OF 2 BOTTLES (BACTERIA AND YEAST)   ADULT ROUTINE BLOOD CULTURE, SET OF 2 BOTTLES (BACTERIA AND YEAST)   CBC/DIFF    Narrative:     The following orders were created for panel order CBC/DIFF.  Procedure                               Abnormality         Status                     ---------                               -----------         ------                     CBC WITH TVNR[[id number]]                Abnormal            Final result               MANUAL DIFF AND MORPHOLO.Marland KitchenMarland Kitchen[[id number]]  Abnormal            Final result                 Please view results for these tests on the individual orders.   URINALYSIS, MACROSCOPIC   LACTIC ACID LEVEL W/ REFLEX FOR LEVEL >2.0   C-REACTIVE PROTEIN (CRP)   PROCALCITONIN   COVID-19 [facility] LAB TESTING       Imaging:   Results for orders placed or performed during the hospital  encounter of 06/05/19 (from the past 72 hour(s))   XR AP MOBILE CHEST     Status: None    Narrative    Female, 86 years old.    XR AP MOBILE CHEST performed on 06/05/2019 11:39 AM.    REASON FOR EXAM:  CHEST PAIN    Findings: A single view the  chest shows the lungs be somewhat  hyperinflated. There are chronic interstitial change is including biapical  scarring. Heart is mildly enlarged. No active infiltrate, failure,  pneumothorax pleural effusion is seen. Patient is diffusely osteopenic.      Impression    1. Chronic interstitial changes and scarring are noted along with mild  cardiomegaly, without an acute cardiopulmonary process.      Radiologist location ID: [id number]     XR HIPS BILATERAL W PELVIS 2 VIEWS     Status: None    Narrative    Female, 86 years old.    XR HIPS BILATERAL W PELVIS 2 VIEWS performed on 06/05/2019 11:39 AM.    REASON FOR EXAM:  trauma, patient status post fall with pain    FINDINGS: A total 3 views pelvis and hips are submitted for interpretation.  Patient is osteopenic. Pelvic ring is intact. There are 3 screws  transfixing the left femoral neck which aren't position. There are mild  degenerative changes about the pelvis and each hip. A fracture-dislocation  at either hip. No failure.      Impression    Osteopenia, postoperative and mild degenerative changes are  seen without an acute bony process.      Radiologist location ID: [id number]     XR LUMBAR SPINE SERIES     Status: None    Narrative    Lumbar spine:    CLINICAL HISTORY: Status post fall with hip pain and back pain.    5 views of the lumbar spine were obtained. Findings are listed below.      Impression    1. Osteopenia with mild compression deformities of L2 on L3 where there is  approximately 20-30% loss of height along the superior endplates. The exact  age is difficult to state with certainty.  2. There is also very mild invagination along the superior endplate of L4.  3. There is a gentle levoscoliosis. Otherwise the alignment is anatomic.  4. Mild spurring anteriorly throughout the lumbar spine although the disc  spaces are well preserved.  5. Moderate facet arthritis in the lower lumbar spine.  6. Heavy vascular calcification in the aorta and iliac  vessels.      Radiologist location ID: [id number]     CT BRAIN WO IV CONTRAST     Status: None    Narrative    Female, 86 years old.    CT BRAIN WO IV CONTRAST performed on 06/05/2019 1:12 PM.    REASON FOR EXAM:  trauma, patient status post fall    CT Dose:  1160 DLP (mGy*cm)  This CT scanner is equipped with dose reducing technology. The mAs is  automatically adjusted to patient's body size in order to deliver the  lowest dose possible.        TECHNIQUE: 5 mm axial images were obtained through the brain without  intravenous contrast.    RESULTS: There is mild central and cortical atrophy consistent with the  patient's age. Several areas of diminished attenuation are seen in the  periventricular white matter bilaterally. These are consistent with chronic  microvascular disease.    There is  no evidence of acute infarct or intracranial hemorrhage. No  extra-axial fluid collections are seen. There is no shift of midline  structures. No intracranial masses are appreciated. The sinuses and  mastoids are clear.      Impression    1. Mild central and cortical atrophy with associated chronic microvascular  changes in the periventricular white matter.   2. No evidence of acute intracranial pathology.      Radiologist location ID: [id number]     CT CERVICAL SPINE WO IV CONTRAST     Status: None    Narrative    Female, 86 years old.    CT CERVICAL SPINE WO IV CONTRAST performed on 06/05/2019 1:13 PM.    REASON FOR EXAM:  trauma, patient status post fall    CT Dose:  225 DLP (mGy*cm)  This CT scanner is equipped with dose reducing technology. The mAs is  automatically adjusted to patient's body size in order to deliver the  lowest dose possible.    FINDINGS: Axial CT imaging of the cervical spine was performed along  sagittal and coronal reformats. No prior studies are available for direct  comparison. The patient is diffusely osteopenic. The vertebral body heights  are maintained. Cervical lordosis is exaggerated. The odontoid  process is  intact. There is facet arthrosis most pronounced lower levels. Central  canal appears widely patent. Neural foramen show some mild narrowing. The  odontoid process is intact. Paravertebral soft tissues show no acute  process.      Impression    1. Osteopenia and mild-to-moderate multilevel degenerative change is seen  process.      Radiologist location ID: [id number]     CT THORACIC SPINE WO IV CONTRAST     Status: None    Narrative    CT of the thoracic spine without contrast.  HISTORY: Injury/fall this morning. Pain.   This CT scanner is equipped with dose reducing technology, including  automated exposure control.   CT dose 273 DLP    Multidetector scan was performed and 3 mm triplane reconstruction images  created. The bones are very osteopenic, with moderate kyphoscoliosis. No  fracture of the upper or mid thoracic vertebrae is seen. There is minimal  cupping of the T12 inferior endplate but no fracture lucency or  paravertebral hematoma is seen to indicate that this is recent. Thoracic  spinal canal is capacious and no large disc protrusion or epidural  abnormality is seen.      Impression    1. Findings include osteopenia, kyphoscoliosis and minimal cupping of the  T12 inferior endplate (likely old).      Radiologist location ID: [id number]     CT LUMBAR SPINE WO IV CONTRAST     Status: None    Narrative    Exam:CT of the lumbar spine without contrast:    Clinical history:Status post fall with left hip pain and mild compression  fractures noted on recent lumbar spine series    Total DLP454mG y*cm    This CT scanner is equipped with dose reducing technology which  automatically reduces the MAS according to patient size to achieve the  lowest radiation exposure possible.    Axial 3 mm images were obtained through the lumbar spine without  intravenous contrast enhancement. In addition, sagittal and coronal  reconstructions were completed. Images were viewed with soft tissue and  bone windows. Comparison  is made with plain films of the lumbar spine  performed earlier today.    The bones are osteopenic.  As noted on the plain films, there are mild  compression deformities involving the L, L3, and L4 vertebral bodies. These  are probably acute with several subtle visible fracture planes particularly  involving the L3 vertebral body anteriorly. There is approximately 30-40%  loss of height at the L3 fracture and 10-20% loss of height at the L2 and  L4 fractures. There is no significant kyphotic angulation at any fracture  site. There may be very mild retropulsion of 2 mm posteriorly and  superiorly at the L2 fracture. There is no spinal canal compromise.    Otherwise there is mild multilevel degenerative disc disease.    At L2-3 there is mild diffuse disc bulging lateralized to the left  resulting in mild left neural foraminal narrowing but no significant  central stenosis or compression of the neural elements.    At L3-4 there is diffuse disc bulging which is also lateralized somewhat to  the left. This results in mild right and moderate left neural foraminal  narrowing.    At L4-5 there is diffuse disc bulging which is lateralized slightly to the  right resulting in mild right neural foraminal narrowing but no significant  central stenosis. The maximum AP canal diameter is at least 10 mm.    At L5-S1 there is a small calcification within the midline of the disc  posteriorly. There is no significant bulging or compression of the neural  elements.    Moderate vascular calcification is noted. There is a small calcification in  the mid right kidney which is probably vascular but could represent a  nonobstructing stone. There is slight aneurysmal dilatation of the mid  abdominal aorta which measures 3.0 cm below the level of the renal  arteries.      Impression    1. There is an acute mild to moderate compression fracture of the L3  vertebral body with approximately 30-40% loss of height along the superior  endplate. There  is very mild retropulsion superiorly but no significant  spinal canal compromise or kyphotic angulation. There is no significant  paraspinal or epidural hematoma.  2. There are also mild compression deformities of L2 and L4 which are  probably acute and demonstrate only 10-20% loss of height with no  retropulsion or kyphotic angulation.  3. Mild multilevel degenerative disc disease with diffuse disc bulging at  nearly all lumbar levels. There is slight neural foraminal narrowing as  described above but no significant central bony stenosis or definite  compression of the neural elements.  4. Moderate ASCVD with a 3 cm saccular aneurysm of the mid abdominal aorta.        Radiologist location ID: [id number]         ECG:    Date/Time ECG Read: 02\12\ 2021 10:57  Most Recent EKG This Encounter   ECG 12 LEAD    Collection Time: 06/05/19 10:57 AM   Result Value    Ventricular rate 68    Atrial Rate 68    PR Interval 124    QRS Duration 76    QT Interval 450    QTC Calculation 478    Calculated P Axis 95    Calculated R Axis -33    Calculated T Axis 41    Narrative    Normal sinus rhythm  Left axis deviation  Minimal voltage criteria for LVH, may be normal variant ( Sokolow-Lyon )  Septal infarct , age undetermined  Abnormal ECG  NO STEMI  REVIEWED BY DR. Victorino Dike  Lindsay Straka  1057     Confirmed by Yong Channel ([id number]), editor Hildred Laser [id number]) on 06/05/2019 12:17:54 PM     Consults:   1342 - Consulted with Dr. Hulda Marin. He is agreeable to admission and consult later today.  1352 - Secure chat started.  1410 - Consulted with Dr. Marina Goodell. He accepted admission of patient to his services.    MDM:   During the patient's stay in the emergency department, the above listed imaging and/or labs were performed to assist with medical decision making and were reviewed by myself as available for review.   Results discussed with patient.   Patient rechecked and remained stable throughout the emergency department course.  All  questions/concerns addressed, and patient agrees with admission plan.        Impression:   Diagnoses       Diagnosis Comment Added By Time Added    Leukocytosis, unspecified type  Talmadge Chad, DO 06/05/2019  2:20 PM    Fall from standing  Conor Filsaime, Alessandra Bevels, DO 06/05/2019  2:21 PM    Lumbar compression fracture (CMS HCC)  Talmadge Chad, DO 06/05/2019  2:21 PM    History of recurrent UTI (urinary tract infection)  Talmadge Chad, DO 06/05/2019  2:21 PM          Disposition:   Admitted    Patient will be admitted to Dr. Lamar Sprinkles service for further evaluation and management.    Discussed with patient all lab and/or imaging results, diagnosis, treatment, and need for admission.  The patient verbalized understanding of all instructions and had no further questions or concerns.     I am scribing for, and in the presence of, Dr. Victorino Dike Caitlin Ainley for services provided on 06/05/2019.  [id number] Go[place], SCRIBE   Joslyn Goins, SCRIBE  06/05/2019, 10:58      I personally performed the services described in this documentation, as scribed  in my presence, and it is both accurate  and complete.    Alessandra Bevels Jumana Paccione, DO  Alessandra Bevels Kimberly Nieland, DO  06/08/19

## 2019-06-05 NOTE — ED Nurses Note (Signed)
Pt laying in bed on back with eyes open. ABCs intact, NAD. Fall precautions in place, call light within reach. Cardiac and respiratory monitoring in place. Pt denies needs or concerns. Pt alert, oriented currently. Awaiting admission bed. Per Lela, tech unable to place Foley catheter per orders.

## 2019-06-05 NOTE — ED Triage Notes (Signed)
Patient stated she had a fall this morning and is having left hip pain

## 2019-06-05 NOTE — Nurses Notes (Signed)
Attempted to place foley. Was unsuccessful. Shanda Bumps, RN attempted as well and was unsuccessful. Bladder scanned the patient and she had 482 in bladder, although patients depends was wet. Notified Trudie Buckler, PA. Will continue to monitor.

## 2019-06-05 NOTE — H&P (Signed)
[facility]  H&P    Alexis Stevens 86 y.o. female A9/A9   Date of Service: 06/05/2019    Date of Admission:  06/05/2019   PCP: Lacy Duverney, MD Code Status:No Order       Chief Complaint:  Fall, weakness  HPI: Alexis Stevens is a 86 y.o., White female with PMH significant for aortic stenosis, hypothyroidism, vitamin D def. She presented to ER with c/o fall and generalized weakness.  Patient states earlier today she was using her walker walking around her bedroom furniture when she fell into her drawer.  Patient states that occasionally she will be lightheaded but does not remember much about the fall.  She did not lose consciousness or hit her head.  She did hit her left side on the ground.  Patient was alone however her son and daughter-in-law check on her frequently.  She was brought to ER after fall.  She denies chest pain, shortness of breath, cough, fever, chills.  Patient is very feeble and weighs only 81 lb.  Patient's daughter-in-law states last year she weighed 96 lb.  Patient denies decreased appetite.  She reports dysuria but no hematuria.  Denies abdominal pain, nausea, vomiting or diarrhea.  She reports she is "sore" from her waist down.  In ER she had trauma CTs and x-rays and was found to have acute mild to moderate compression fracture of L3 and mild compression fracture of L2 and L4.  Neurosurgery was contacted who will see patient in consultation.      ROS:   A 10 point organ ROS obtained and negative except for pertinent positives noted in the HPI.    PMHx:    Past Medical History:   Diagnosis Date   . Aortic stenosis    . Hypothyroidism    . Low vitamin D level    . Osteoarthritis    . Osteoporosis    . Rosacea    . Thyroid disease         PSHx:   Past Surgical History:   Procedure Laterality Date   . HIP ARTHROSCOPY     . LITHOTRIPSY            Allergies:    Allergies   Allergen Reactions   . Sulfa (Sulfonamides)  Other Adverse Reaction  (Add comment)     Unknown    Social History  Social History     Tobacco Use   . Smoking status: Never Smoker   . Smokeless tobacco: Never Used   Substance Use Topics   . Alcohol use: Never   . Drug use: Never       Family History  Family Medical History:     Problem Relation (Age of Onset)    Arthritis-osteo Mother    Breast Cancer Mother    Emphysema Father    Glaucoma Other    No Known Problems Sister, Brother, Maternal Grandmother, Maternal Grandfather, Paternal Grandmother, Paternal Grandfather    Other Mother    Stroke Father               Home Meds:      Prior to Admission medications    Medication Sig Start Date End Date Taking? Authorizing Provider   cephalexin (KEFLEX) 500 mg Oral Capsule Take 1 Cap (500 mg total) by mouth Twice daily 05/02/18   Lacy Duverney, MD   levothyroxine (SYNTHROID) 25 mcg Oral Tablet TAKE 1 TABLET BY MOUTH ONCE DAILY. TAKE FIRST THING IN THE MORNING  ON AN EMPTY STOMACH. 17 TO [place] TO ANY OTHER MEDICATIONS WITH 04/15/19   Julianne Handler, MD          Results for orders placed or performed during the hospital encounter of 06/05/19 (from the past 24 hour(s))   CBC/DIFF    Narrative    The following orders were created for panel order CBC/DIFF.  Procedure                               Abnormality         Status                     ---------                               -----------         ------                     CBC WITH GBTD[[id number]]                Abnormal            Final result               MANUAL DIFF AND MORPHOLO.Marland KitchenMarland Kitchen[[id number]]  Abnormal            Final result                 Please view results for these tests on the individual orders.   COMPREHENSIVE METABOLIC PANEL, NON-FASTING   Result Value Ref Range    SODIUM 140 136 - 145 mmol/L    POTASSIUM 5.2 (H) 3.5 - 5.1 mmol/L    CHLORIDE 106 96 - 111 mmol/L    CO2 TOTAL 23 23 - 31 mmol/L    ANION GAP 11 4 - 13 mmol/L    BUN 33 (H) 8 - 25 mg/dL    CREATININE 0.98 0.60 - 1.05 mg/dL    BUN/CREA RATIO 34 (H) 6 -  22    ESTIMATED GFR 50 (L) >=60 mL/min/BSA    ALBUMIN 3.4 3.4 - 4.8 g/dL     CALCIUM 9.1 8.8 - 10.2 mg/dL    GLUCOSE 110 65 - 125 mg/dL    ALKALINE PHOSPHATASE 122 55 - 145 U/L    ALT (SGPT) 17 8 - 22 U/L    AST (SGOT)  40 8 - 45 U/L    BILIRUBIN TOTAL 0.7 0.3 - 1.3 mg/dL    PROTEIN TOTAL 6.8 6.0 - 8.0 g/dL   LDL CHOLESTEROL, DIRECT   Result Value Ref Range    LDL DIRECT 87 <100 mg/dL   PT/INR   Result Value Ref Range    PROTHROMBIN TIME 11.8 9.7 - 13.6 seconds    INR 0.98 <=5.00   PTT (PARTIAL THROMBOPLASTIN TIME)   Result Value Ref Range    APTT 29.9 26.0 - 39.0 seconds   TROPONIN-I   Result Value Ref Range    TROPONIN I 14 7 - 30 ng/L   CBC WITH DIFF   Result Value Ref Range    WBC 21.3 (H) 3.7 - 11.0 x10^3/uL    RBC 3.59 (L) 3.85 - 5.22 x10^6/uL    HGB 11.1 (L) 11.5 - 16.0 g/dL    HCT 34.1 (L) 34.8 - 46.0 %    MCV 95.0 78.0 - 100.0  fL    MCH 30.9 26.0 - 32.0 pg    MCHC 32.6 31.0 - 35.5 g/dL    RDW-CV 52.812.9 41.311.5 - 24.415.5 %    PLATELETS 211 150 - 400 x10^3/uL    MPV 9.5 8.7 - 12.5 fL   MANUAL DIFF AND MORPHOLOGY-SYSMEX   Result Value Ref Range    NEUTROPHIL % 88 %    LYMPHOCYTE %  1 %    MONOCYTE % 11 %    EOSINOPHIL % 0 %    BASOPHIL % 0 %    NEUTROPHIL # 18.74 (H) 1.50 - 7.70 x10^3/uL    LYMPHOCYTE # 0.21 (L) 1.00 - 4.80 x10^3/uL    MONOCYTE # 2.34 (H) 0.20 - 1.10 x10^3/uL    EOSINOPHIL # <0.04 <=0.50 x10^3/uL    BASOPHIL # <0.04 <=0.20 x10^3/uL    RBC MORPHOLOGY Normal RBC and PLT Morphology         Imaging:  CT BRAIN WO IV CONTRAST    Result Date: 06/05/2019  Female, 86 years old. CT BRAIN WO IV CONTRAST performed on 06/05/2019 1:12 PM. REASON FOR EXAM:  trauma, patient status post fall CT Dose:  1160 DLP (mGy*cm) This CT scanner is equipped with dose reducing technology. The mAs is automatically adjusted to patient's body size in order to deliver the lowest dose possible. TECHNIQUE: 5 mm axial images were obtained through the brain without intravenous contrast. RESULTS: There is mild central and cortical atrophy  consistent with the patient's age. Several areas of diminished attenuation are seen in the periventricular white matter bilaterally. These are consistent with chronic microvascular disease. There is no evidence of acute infarct or intracranial hemorrhage. No extra-axial fluid collections are seen. There is no shift of midline structures. No intracranial masses are appreciated. The sinuses and mastoids are clear.     1. Mild central and cortical atrophy with associated chronic microvascular changes in the periventricular white matter. 2. No evidence of acute intracranial pathology. Radiologist location ID: [id number]     CT CERVICAL SPINE WO IV CONTRAST    Result Date: 06/05/2019  Female, 86 years old. CT CERVICAL SPINE WO IV CONTRAST performed on 06/05/2019 1:13 PM. REASON FOR EXAM:  trauma, patient status post fall CT Dose:  225 DLP (mGy*cm) This CT scanner is equipped with dose reducing technology. The mAs is automatically adjusted to patient's body size in order to deliver the lowest dose possible. FINDINGS: Axial CT imaging of the cervical spine was performed along sagittal and coronal reformats. No prior studies are available for direct comparison. The patient is diffusely osteopenic. The vertebral body heights are maintained. Cervical lordosis is exaggerated. The odontoid process is intact. There is facet arthrosis most pronounced lower levels. Central canal appears widely patent. Neural foramen show some mild narrowing. The odontoid process is intact. Paravertebral soft tissues show no acute process.     1. Osteopenia and mild-to-moderate multilevel degenerative change is seen process. Radiologist location ID: [id number]     CT THORACIC SPINE WO IV CONTRAST    Result Date: 06/05/2019  CT of the thoracic spine without contrast. HISTORY: Injury/fall this morning. Pain. This CT scanner is equipped with dose reducing technology, including automated exposure control.   CT dose 273 DLP Multidetector scan was performed  and 3 mm triplane reconstruction images created. The bones are very osteopenic, with moderate kyphoscoliosis. No fracture of the upper or mid thoracic vertebrae is seen. There is minimal cupping of the T12 inferior endplate but no fracture lucency or  paravertebral hematoma is seen to indicate that this is recent. Thoracic spinal canal is capacious and no large disc protrusion or epidural abnormality is seen.     1. Findings include osteopenia, kyphoscoliosis and minimal cupping of the T12 inferior endplate (likely old). Radiologist location ID: [id number]     CT LUMBAR SPINE WO IV CONTRAST    Result Date: 06/05/2019  Exam:CT of the lumbar spine without contrast: Clinical history:Status post fall with left hip pain and mild compression fractures noted on recent lumbar spine series Total DLP454mG y*cm This CT scanner is equipped with dose reducing technology which automatically reduces the MAS according to patient size to achieve the lowest radiation exposure possible. Axial 3 mm images were obtained through the lumbar spine without intravenous contrast enhancement. In addition, sagittal and coronal reconstructions were completed. Images were viewed with soft tissue and bone windows. Comparison is made with plain films of the lumbar spine performed earlier today. The bones are osteopenic. As noted on the plain films, there are mild compression deformities involving the L, L3, and L4 vertebral bodies. These are probably acute with several subtle visible fracture planes particularly involving the L3 vertebral body anteriorly. There is approximately 30-40% loss of height at the L3 fracture and 10-20% loss of height at the L2 and L4 fractures. There is no significant kyphotic angulation at any fracture site. There may be very mild retropulsion of 2 mm posteriorly and superiorly at the L2 fracture. There is no spinal canal compromise. Otherwise there is mild multilevel degenerative disc disease. At L2-3 there is mild diffuse  disc bulging lateralized to the left resulting in mild left neural foraminal narrowing but no significant central stenosis or compression of the neural elements. At L3-4 there is diffuse disc bulging which is also lateralized somewhat to the left. This results in mild right and moderate left neural foraminal narrowing. At L4-5 there is diffuse disc bulging which is lateralized slightly to the right resulting in mild right neural foraminal narrowing but no significant central stenosis. The maximum AP canal diameter is at least 10 mm. At L5-S1 there is a small calcification within the midline of the disc posteriorly. There is no significant bulging or compression of the neural elements. Moderate vascular calcification is noted. There is a small calcification in the mid right kidney which is probably vascular but could represent a nonobstructing stone. There is slight aneurysmal dilatation of the mid abdominal aorta which measures 3.0 cm below the level of the renal arteries.     1. There is an acute mild to moderate compression fracture of the L3 vertebral body with approximately 30-40% loss of height along the superior endplate. There is very mild retropulsion superiorly but no significant spinal canal compromise or kyphotic angulation. There is no significant paraspinal or epidural hematoma. 2. There are also mild compression deformities of L2 and L4 which are probably acute and demonstrate only 10-20% loss of height with no retropulsion or kyphotic angulation. 3. Mild multilevel degenerative disc disease with diffuse disc bulging at nearly all lumbar levels. There is slight neural foraminal narrowing as described above but no significant central bony stenosis or definite compression of the neural elements. 4. Moderate ASCVD with a 3 cm saccular aneurysm of the mid abdominal aorta. Radiologist location ID: [id number]     XR AP MOBILE CHEST    Result Date: 06/05/2019  Female, 86 years old. XR AP MOBILE CHEST performed  on 06/05/2019 11:39 AM. REASON FOR EXAM:  CHEST PAIN Findings: A single  view the chest shows the lungs be somewhat hyperinflated. There are chronic interstitial change is including biapical scarring. Heart is mildly enlarged. No active infiltrate, failure, pneumothorax pleural effusion is seen. Patient is diffusely osteopenic.     1. Chronic interstitial changes and scarring are noted along with mild cardiomegaly, without an acute cardiopulmonary process. Radiologist location ID: [id number]     XR LUMBAR SPINE SERIES    Result Date: 06/05/2019  Lumbar spine: CLINICAL HISTORY: Status post fall with hip pain and back pain. 5 views of the lumbar spine were obtained. Findings are listed below.     1. Osteopenia with mild compression deformities of L2 on L3 where there is approximately 20-30% loss of height along the superior endplates. The exact age is difficult to state with certainty. 2. There is also very mild invagination along the superior endplate of L4. 3. There is a gentle levoscoliosis. Otherwise the alignment is anatomic. 4. Mild spurring anteriorly throughout the lumbar spine although the disc spaces are well preserved. 5. Moderate facet arthritis in the lower lumbar spine. 6. Heavy vascular calcification in the aorta and iliac vessels. Radiologist location ID: [id number]     XR HIPS BILATERAL W PELVIS 2 VIEWS    Result Date: 06/05/2019  Female, 86 years old. XR HIPS BILATERAL W PELVIS 2 VIEWS performed on 06/05/2019 11:39 AM. REASON FOR EXAM:  trauma, patient status post fall with pain FINDINGS: A total 3 views pelvis and hips are submitted for interpretation. Patient is osteopenic. Pelvic ring is intact. There are 3 screws transfixing the left femoral neck which aren't position. There are mild degenerative changes about the pelvis and each hip. A fracture-dislocation at either hip. No failure.     Osteopenia, postoperative and mild degenerative changes are seen without an acute bony process. Radiologist location  ID: [id number]     ECG 12 LEAD    Result Date: 06/05/2019  Normal sinus rhythm Left axis deviation Minimal voltage criteria for LVH, may be normal variant ( Sokolow-Lyon ) Septal infarct , age undetermined Abnormal ECG NO STEMI REVIEWED BY DR. Yong Channel  [id number]  Confirmed by Yong Channel ([id number]), editor Hildred Laser [phone]) on 06/05/2019 12:17:54 PM        Inpatient Medications:  NS bolus infusion 500 mL, 500 mL, Intravenous, Once  NS flush syringe, 3 mL, Intracatheter, Q8HRS  NS flush syringe, 3 mL, Intracatheter, Q1H PRN       ______________________________________________________________________    PHYSICAL EXAM:  VITALS: BP 119/79   Pulse 73   Temp 36 C (96.8 F)   Resp (!) 27   Ht 1.524 m (5')   Wt (!) 36.7 kg (81 lb)   SpO2 100%   BMI 15.82 kg/m         GENERAL:   Pt is a pleasant, thin, frail 86 y.o. female who is resting comfortably in bed in NAD. Appears stated age  HENT:  head normocephalic, symmetrical facies. No rhinorrhea.   Eyes: EOM intact. PERRLA. Sclera non-icteric, non-injected.  NECK: supple. No masses  CV: RRR.  Faint murmur left sternal border, no rubs, gallops. No BLE edema  LUNGS: CTAB, No rhonchi, rales, wheezes.   GI: (+) BS in all 4 quadrants. soft, NT/ND. No rigidity/ guarding/ rebound.   MSK: full AROM. No joint effusions/ swelling/ deformities.   SKIN:  Ecchymosis noted to right upper forearm and left upper forearm with skin tear of posterior right forearm.  NEURO: CN grossly intact. Sensation intact b/l. No focal deficits.  PSYCH: A&Ox4.         Assessments:  1. Mechanical fall  2. Acute compression fracture L3 (mild-moderate)  3. Acute compression fracture L2 & L4 (mild)  4. Generalized weakness with Decreased appetite with weight loss  5. Leukocytosis - unclear etiology  6. Dysuria - ? UTI  7. Hypothyroidism      Plan:   Admit to tele for fall/generalized weakness. She had trauma CT scans and was found to have Acute mod compression fracture L3 with 30-40% height loss and  mild compression fracture L2 and L4 with 10-20% height loss. Neurosurgery Dr. Arlie Solomons consulted. Will consult PT/OT.  Ultram p.r.n. for pain.   Generlized weakness may be due to UTI. She has dysuria. WBC count is elevated at 21. UA, UCX, LA, BCX, procalcitonin pending. Start IVF.  Empiric Rocephin.  Consult dietician for decreased appetite and weight loss.  She has lost approximately 15 lb over the last year.   Cont synthroid. Check TSH    DVT prophylaxis: SCDS (fall risk)    All critical aspects of this history and physical and assessment/plan was discussed in detail with Dr. Marina Goodell, and he does agree with the stated above.   Donald Pore, APRN,NP-C      Patient seen and examined independently.  86 year old female with history of hypothyroidism and aortic stenosis who presents to [facility] with progressive weakness status post fall.  ER evaluation revealed acute compression fractures of the L-spine.  Additionally patient with some dysuria consistent with UTI.  Patient be admitted to the medical floor with consultations to Neurosurgery for evaluation of further intervention.  Will begin pain control with Ultram and escalate as necessary.  Spoke with family at length discussed the treatment plan the medical issues at hand.  Exam reveals clear lungs and soft abdomen.  Agree with assessment and plan as described above.    Leonette Most, DO

## 2019-06-05 NOTE — ED Nurses Note (Signed)
Report given to Daiva Eves, RN on [address] Notified receiving RN that pt does not have Foley and needs urine sample sent due to techs in ED having difficulty placing Foley. States that is fine and they will try when pt arrives to floor.

## 2019-06-06 DIAGNOSIS — E43 Unspecified severe protein-calorie malnutrition: Secondary | ICD-10-CM

## 2019-06-06 DIAGNOSIS — R29898 Other symptoms and signs involving the musculoskeletal system: Secondary | ICD-10-CM

## 2019-06-06 LAB — MANUAL DIFF AND MORPHOLOGY-SYSMEX
BASOPHIL #: 0.14 10*3/uL (ref <=0.20)
BASOPHIL %: 1 %
EOSINOPHIL #: <0.1 10*3/uL (ref <=0.50)
EOSINOPHIL %: 0 %
LYMPHOCYTE #: 1.85 10*3/uL (ref 1.00–4.80)
LYMPHOCYTE %: 13 %
METAMYELOCYTE %: 1 %
MONOCYTE #: 2.13 10*3/uL — ABNORMAL HIGH (ref 0.20–1.10)
MONOCYTE %: 15 %
NEUTROPHIL #: 9.94 10*3/uL — ABNORMAL HIGH (ref 1.50–7.70)
NEUTROPHIL %: 69 %
NEUTROPHIL BANDS %: 1 %
RBC MORPHOLOGY: NORMAL

## 2019-06-06 LAB — CBC WITH DIFF
HCT: 30.1 % — ABNORMAL LOW (ref 34.8–46.0)
HGB: 9.3 g/dL — ABNORMAL LOW (ref 11.5–16.0)
MCH: 30.4 pg (ref 26.0–32.0)
MCHC: 30.9 g/dL — ABNORMAL LOW (ref 31.0–35.5)
MCV: 98.4 fL (ref 78.0–100.0)
MPV: 10.1 fL (ref 8.7–12.5)
PLATELETS: 189 10*3/uL (ref 150–400)
RBC: 3.06 10*6/uL — ABNORMAL LOW (ref 3.85–5.22)
RDW-CV: 13.1 % (ref 11.5–15.5)
WBC: 14.2 10*3/uL — ABNORMAL HIGH (ref 3.7–11.0)

## 2019-06-06 LAB — COMPREHENSIVE METABOLIC PANEL, NON-FASTING
ALBUMIN: 2.8 g/dL — ABNORMAL LOW (ref 3.4–4.8)
ALKALINE PHOSPHATASE: 97 U/L (ref 55–145)
ALT (SGPT): 20 U/L (ref 8–22)
ANION GAP: 8 mmol/L (ref 4–13)
AST (SGOT): 46 U/L — ABNORMAL HIGH (ref 8–45)
BILIRUBIN TOTAL: 0.5 mg/dL (ref 0.3–1.3)
BUN/CREA RATIO: 31 — ABNORMAL HIGH (ref 6–22)
BUN: 36 mg/dL — ABNORMAL HIGH (ref 8–25)
CALCIUM: 8.1 mg/dL — ABNORMAL LOW (ref 8.8–10.2)
CHLORIDE: 106 mmol/L (ref 96–111)
CO2 TOTAL: 24 mmol/L (ref 23–31)
CREATININE: 1.15 mg/dL — ABNORMAL HIGH (ref 0.60–1.05)
ESTIMATED GFR: 41 mL/min/BSA — ABNORMAL LOW (ref >=60)
GLUCOSE: 108 mg/dL (ref 65–125)
POTASSIUM: 4.5 mmol/L (ref 3.5–5.1)
PROTEIN TOTAL: 5.8 g/dL — ABNORMAL LOW (ref 6.0–8.0)
SODIUM: 138 mmol/L (ref 136–145)

## 2019-06-06 LAB — MAGNESIUM: MAGNESIUM: 2 mg/dL (ref 1.8–2.6)

## 2019-06-06 LAB — COVID-19 ~~LOC~~ MOLECULAR LAB TESTING: 2019-nCoV/SARS-CoV-2: NOT DETECTED

## 2019-06-06 IMAGING — CR DG CHEST 2V
1 series · 3 of 3 positions shown · non-contrast
Comparison: June 14, 2018

CLINICAL DATA: Dizziness and chest pain.

EXAM:
CHEST - 2 VIEW

[Series 1: dg chest 2 view · 0.14mm/px · 3 of 3 slices shown]
[im 1/3]
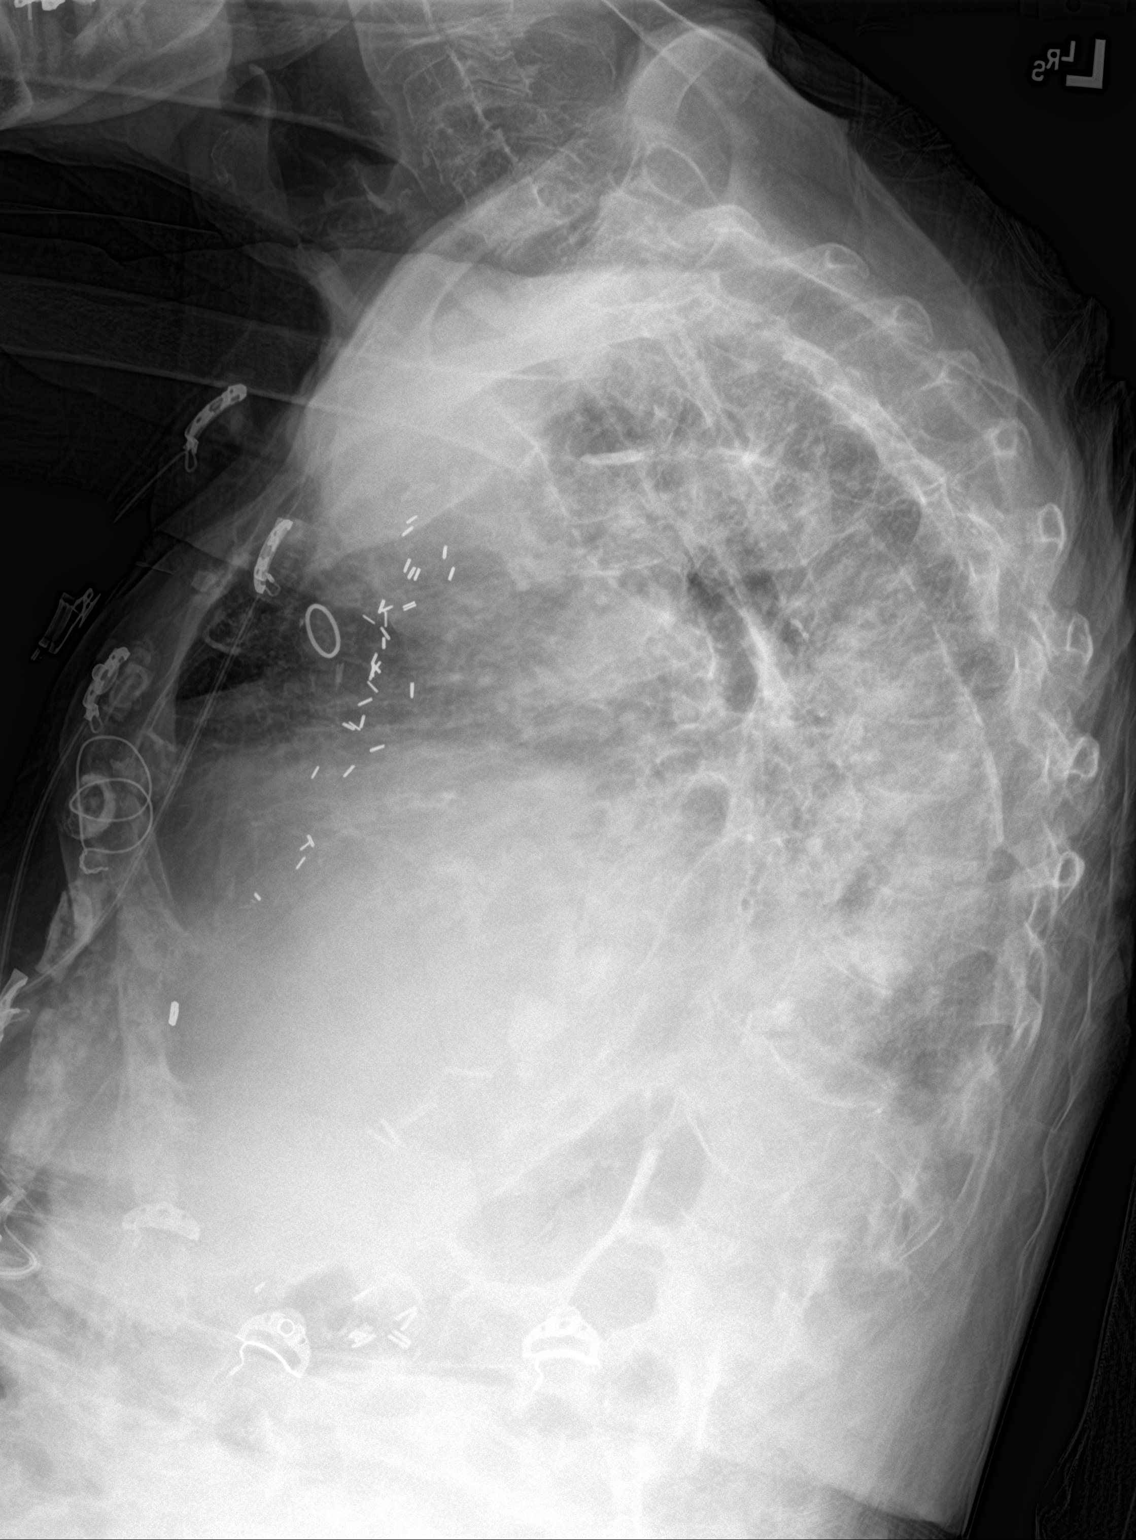
[im 2/3]
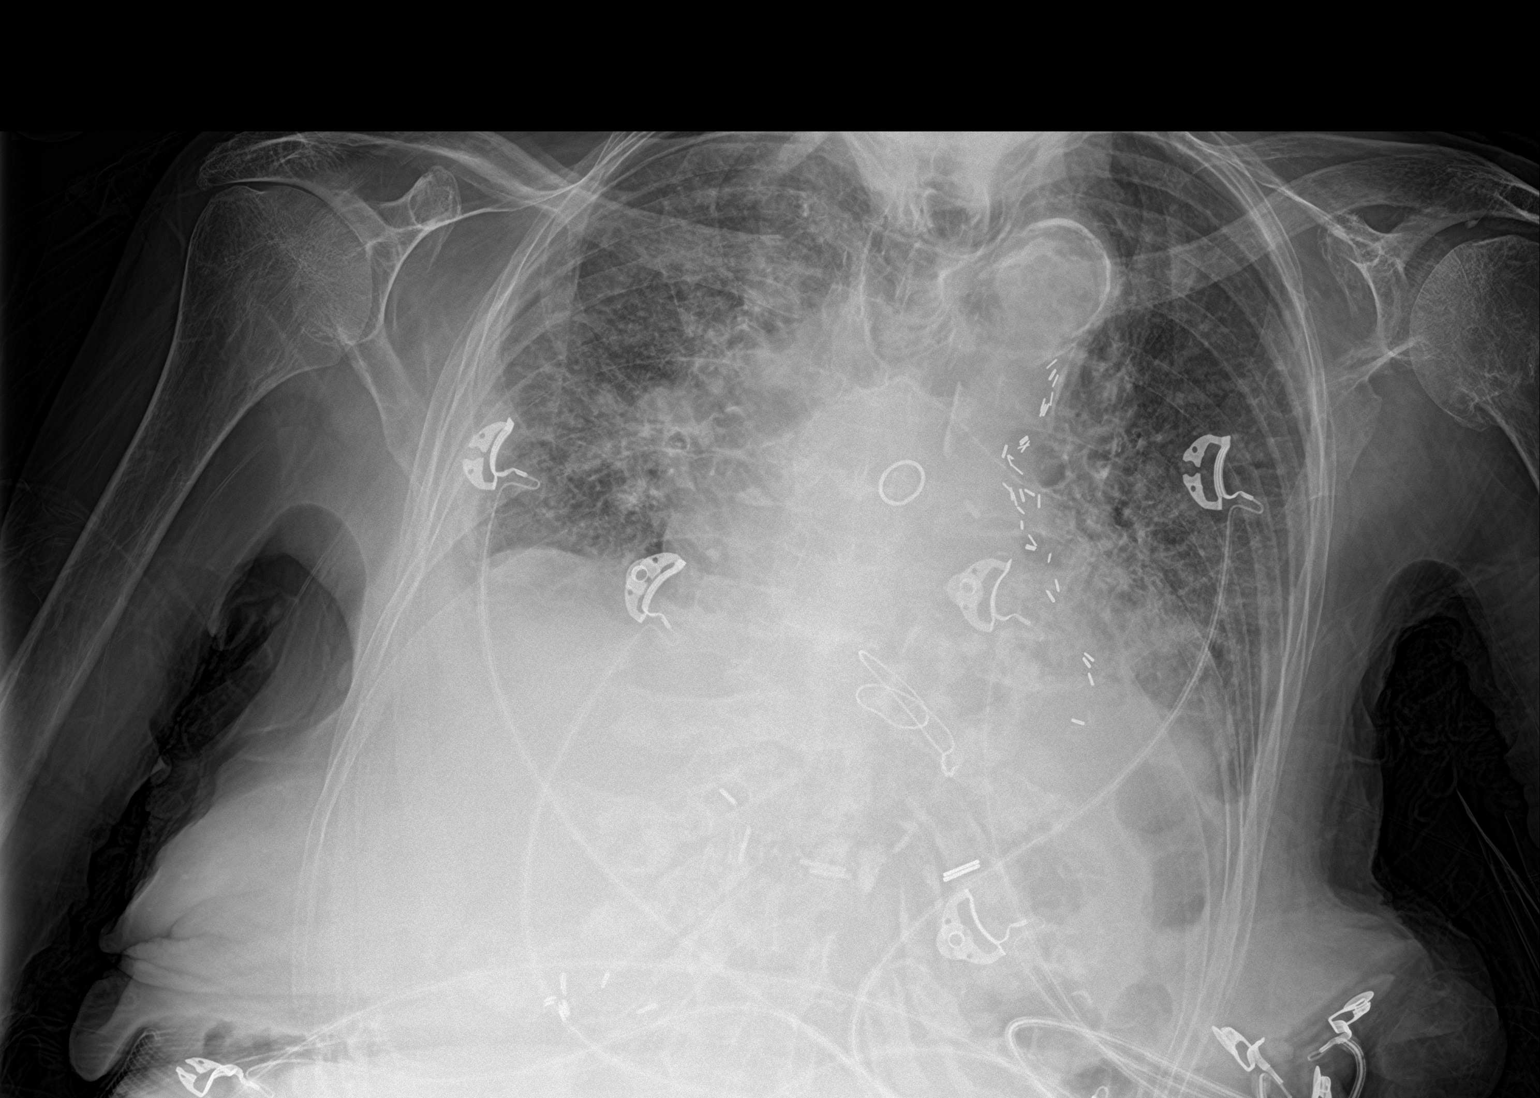
[im 3/3]
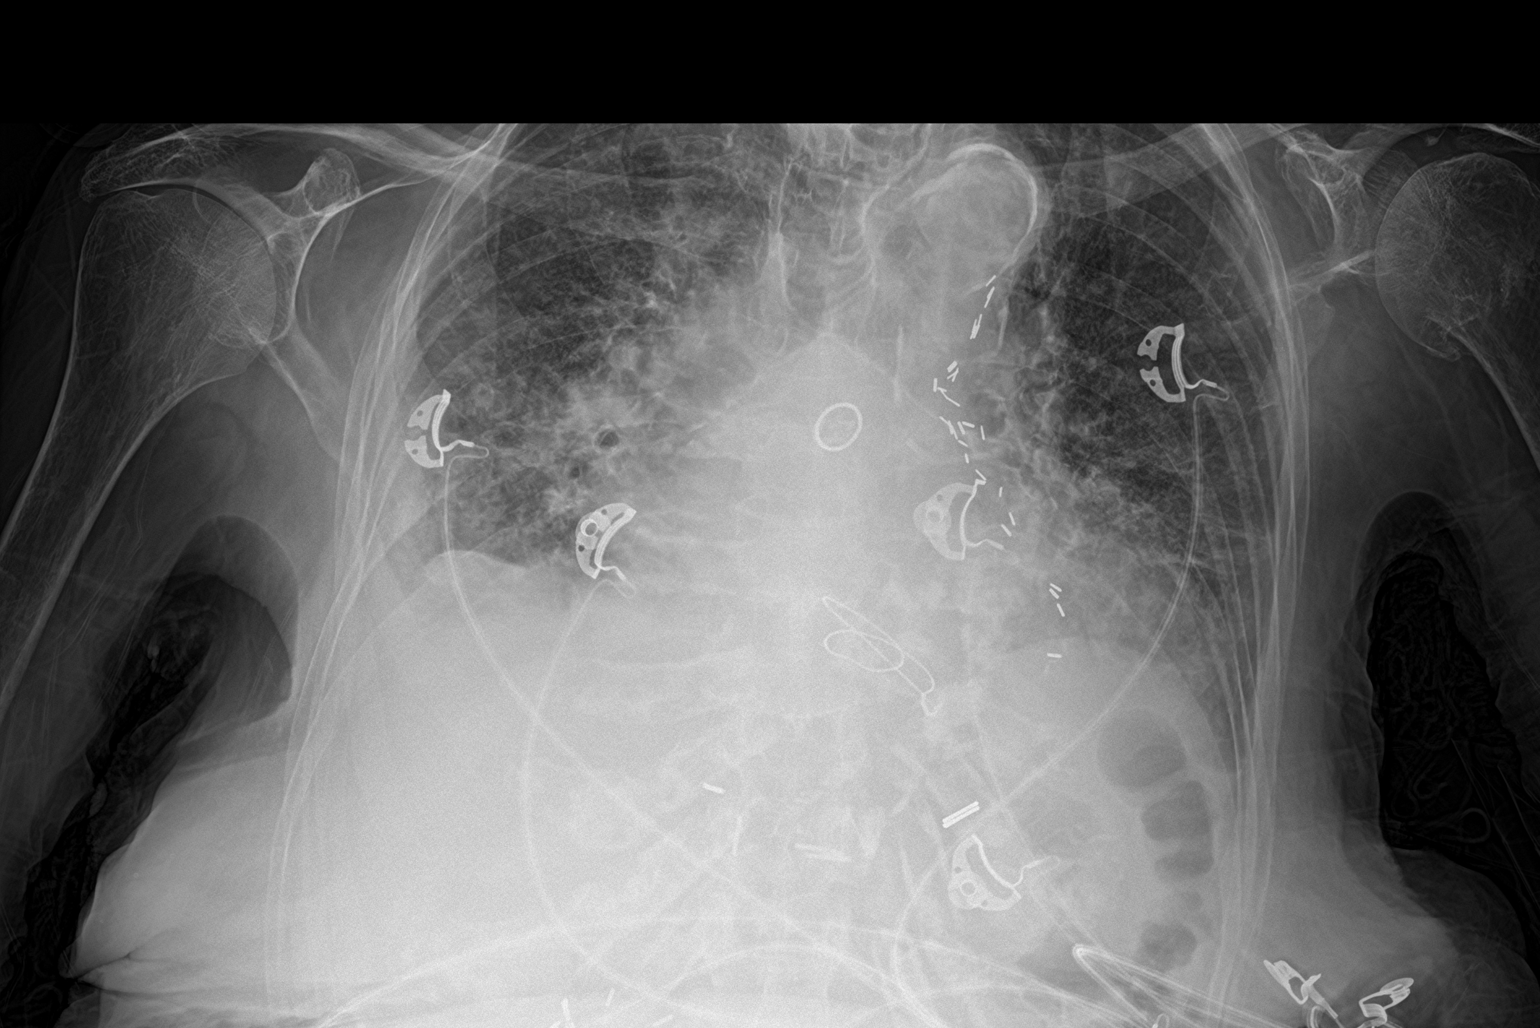

[3 of 3 positions shown; findings below may reference images not displayed]

FINDINGS: The cardiomediastinal silhouette is unremarkable. Persistent
cardiomegaly. Diffuse interstitial opacities suggest pulmonary
edema. There appear to be small bilateral pleural effusions as well.
No other acute abnormalities.
IMPRESSION: Findings are most consistent with pulmonary edema and bilateral
pleural effusions.

## 2019-06-06 MED ORDER — HEPARIN (PORCINE) 5,000 UNIT/ML INJECTION SOLUTION
5000.00 [IU] | Freq: Three times a day (TID) | INTRAMUSCULAR | Status: DC
Start: 2019-06-06 — End: 2019-06-08
  Administered 2019-06-06 – 2019-06-07 (×5): 5000 [IU] via SUBCUTANEOUS
  Filled 2019-06-06 (×5): qty 1

## 2019-06-06 NOTE — Care Plan (Signed)
Attempted to see but has a compression fx of L3 and has not been seen by Neurosurgery first so will wait for that consult to be completed and recommendations given before seeing this patient.

## 2019-06-06 NOTE — Progress Notes (Signed)
[facility]  Medicine Progress Note  No CPR    Alexis Stevens  Date of service: 06/06/2019    Subjective:  86 y.o., White female with PMH significant for aortic stenosis, hypothyroidism, vitamin D def who presents from home where she lives alone to ER with c/o fall and generalized weakness.  Patient states earlier today she was using her walker walking around her bedroom furniture when she fell into her drawer. She did not lose consciousness or hit her head but did hit her left side on the ground.  Patient was alone however her son and daughter-in-law check on her frequently and after found was brought to ER.  She denies chest pain, shortness of breath, cough, fever, chills. + dysuria and UA pending. Placed on empiric Rocephin.  Patient is very feeble, deconditioned and has been losing weight on an ongoing basis due to poor eating and activity habits. In ER she had trauma CTs and x-rays and was found to have acute mild to moderate compression fracture of L3 and mild compression fracture of L2 and L4.  Neurosurgery was contacted who will see patient in consultation.    WBC ct improved. Await UA.  Lying in bed and appears comfortable.      Vital Signs:  Temp (24hrs) Max:37.9 C (100.2 F)      Systolic (24hrs), Avg:110 , Min:93 , Max:119     Diastolic (24hrs), Avg:62, Min:42, Max:99    Temp  Avg: 36.9 C (98.5 F)  Min: 36 C (96.8 F)  Max: 37.9 C (100.2 F)  MAP (Non-Invasive)  Avg: 76.5 mmHG  Min: 60 mmHG  Max: 106 mmHG  Pulse  Avg: 78.3  Min: 64  Max: 97  Resp  Avg: 20.4  Min: 16  Max: 27  SpO2  Avg: 94.9 %  Min: 78 %  Max: 100 %  Pain Score (Numeric, Faces): 0  Fi02    I/O:  I/O last 24 hours:      Intake/Output Summary (Last 24 hours) at 06/06/2019 0722  Last data filed at 06/05/2019 1413  Gross per 24 hour   Intake 500 ml   Output --   Net 500 ml     I/O current shift:  No intake/output data recorded.  Blood Sugars: Last Fingerstick:  No results found for: GLUCOSEPOC      Inpatient  Medications:  acetaminophen (TYLENOL) tablet, 500 mg, Oral, Q4H PRN  cefTRIAXone (ROCEPHIN) 1 g in iso-osmotic 50 mL premix IVPB, 1 g, Intravenous, Q24H  influenza virus vaccine (PF) IM injection (FLUARIX for ages 6 months through adult), 0.5 mL, IntraMUSCULAR, Once  ipratropium-albuterol 0.5 mg-3 mg(2.5 mg base)/3 mL Solution for Nebulization, 3 mL, Nebulization, Q4H PRN  levothyroxine (SYNTHROID) tablet, 25 mcg, Oral, QAM  magnesium hydroxide (MILK OF MAGNESIA) 400mg  per 46mL oral liquid, 15 mL, Oral, Daily PRN  NS flush syringe, 3 mL, Intracatheter, Q8HRS  NS flush syringe, 3 mL, Intracatheter, Q1H PRN  NS flush syringe, 3 mL, Intracatheter, Q8HRS  NS flush syringe, 3 mL, Intracatheter, Q1H PRN  NS premix infusion, , Intravenous, Continuous  ondansetron (ZOFRAN) 2 mg/mL injection, 4 mg, Intravenous, Q6H PRN  traMADol (ULTRAM) tablet, 50 mg, Oral, Q8H PRN           Allergies:    Allergies   Allergen Reactions   . Sulfa (Sulfonamides)  Other Adverse Reaction (Add comment)     Unknown        Physical Exam:  Constitutional: cachectic  Eyes: Conjunctiva  clear.  ENT: ENMT without erythema or injection, mucous membranes moist.  Neck: no thyromegaly or lymphadenopathy  Respiratory: Clear to auscultation bilaterally.   Cardiovascular: regular rate and rhythm  Gastrointestinal: Soft, non-tender  Genitourinary: Deferred  Musculoskeletal: Head atraumatic and normocephalic  Integumentary:  Skin warm and dry    Labs:    CBC Results Differential Results   Recent Labs     06/05/19  1108 06/06/19  0151   WBC 21.3* 14.2*   HGB 11.1* 9.3*   HCT 34.1* 30.1*   PLTCNT 211 189   BANDS  --  1      Recent Labs     06/05/19  1108 06/06/19  0151   LYMPHOCYTES 1 13   MONOCYTES 11 15   EOSINOPHIL 0 0   PMNABS 18.74* 9.94*   MONOSABS 2.34* 2.13*   EOSABS <0.04 <0.10          BMP Results Other Chemistries Results   Recent Labs     06/05/19  1108 06/06/19  0151   SODIUM 140 138   POTASSIUM 5.2* 4.5   CHLORIDE 106 106   CO2 23 24   BUN 33* 36*    CREATININE 0.98 1.15*   GFR 50* 41*   GLUCOSENF 110 108        Recent Labs     06/05/19  1108 06/06/19  0151   CALCIUM 9.1 8.1*   ALBUMIN 3.4 2.8*   MAGNESIUM  --  2.0      Liver/Pancreas Enzyme Results ABG results   Recent Labs     06/06/19  0151   AST 46*   ALT 20   ALKPHOS 97        No results found for this encounter   Cardiac Results    Coag Results   Recent Labs     06/05/19  1108   TROPONINI 14    Recent Labs     06/05/19  1108   INR 0.98   PROTHROMTME 11.8   APTT 29.9            Assessment/ Plan:   Active Hospital Problems    Diagnosis   . Primary Problem: Compression fracture of lumbar vertebra (CMS HCC)   . Muscular deconditioning   . Severe protein-calorie malnutrition (CMS HCC)   . Fall   . Aortic stenosis   . Hypothyroidism     N surgery consulted. Pain control w Ultram. Consult nutrition, PT/OT. Await UA w Cx. Encouraged with family present to inc po intake and be more active or risk worsening health status. Discussed skilled at DC.     DVT/PE Prophylaxis: Heparin    Disposition Planning: Skilled Nursing Unit     Theresa Mulligan, DO

## 2019-06-06 NOTE — Care Management Notes (Signed)
Received call that pt's son requested me to come speak with him.  Went to unit and spoke with Carlyn Reichert.  He says he and his wife are interested in setting up a family meeting with case management to discuss DC planning.  He does say they are not interested in home health or rehab on DC, but says they are discussing either moving in to patient's home or patient moving in with them.  He says patient has agreed with them this needs to happen.  They are also interested in discussing DME equipment on DC.  Provided them my phone number for meeting tomorrow or Monday's case manager number as well Foye Clock).  He says him and his wife will call me tomorrow to discuss options.  Did provide Trey Paula with caregiver agency list as he expressed interest in this for when he and his wife need to go to dr's appts.  Informed him he would need to call to check services, availability, and pricing with these agencies and he states understanding.  Karlene Lineman, RN  06/06/2019, 17:27.

## 2019-06-06 NOTE — Nurses Notes (Signed)
Dr. Arlie Solomons re-paged with return call. Spoke with patient son via telephone.     Patients son informed this nurse that the plan will be to let patient heal up and see if she gets better and if not than follow up with [facility]. Son states that he will be talking patient home and she will either stay with him and his wife or they will come to her house and stay with her there. Case management to come speak with patient son Tinnie Gens regarding getting patient a hospital bed.

## 2019-06-06 NOTE — Care Plan (Signed)
Problem: Adult Inpatient Plan of Care  Goal: Plan of Care Review  Outcome: Ongoing (see interventions/notes)     Problem: Oral Intake Inadequate  Goal: Improved Oral Intake  Outcome: Ongoing (see interventions/notes)   Medical Nutrition Therapy Assessment        ASSESSMENT: Pt is a 86 year old female with a history of hypothyroidism, aortic stenosis, and vitamin D deficiency. RD was consulted for assessment of protein/ calorie needs and malnutrition. Admitted for a compression fracture of lumbar vertebra. Pt's daughter-in-law states that she weighed 96 lbs one year ago. Wt hx in chart reveals no wt loss in the past year. There is a wt of 90 lbs from 01/2018, but this falls outside of the window for significant wt loss. Pt denies any changes in appetite. Pt consumed 25% of breakfast, which is approximately 50% of her estimated caloric needs. Would benefit from a nutritional supplement to provide adequate protein and calories to promote bone healing.     Current Diet Order/Nutrition Support:  DIET REGULAR     Height Used for Calculations: 152.4 cm (5')  Weight Used For Calculations: 36.7 kg (80 lb 14.5 oz)  BMI (kg/m2): 15.83  BMI Assessment: less than 18.5 - underweight  Ideal Body Weight (IBW) (kg): 45.86  % Ideal Body Weight: 80.03           Weight Loss: 0 kg (0 lb)      Physical Assessment: Moderate muscle wasting of temple and clavicle per RN.     Estimated Needs:    Energy Calorie Requirements: [phone] per day (30-35 kcals/45.5 kg IBW)  Protein Requirements (gms/day): 55-68 per day (1.2-1.5 g/45.5 kg IBW)  Fluid Requirements: 1694-5038 per day (25-30 mLs/45.5 kg IBW)    INTERVENTION: Add Ensure Enlive BID @ lunch and dinner to provide adequate protein and calories. Goal: Consume 50% of meals and supplements by next RD visit.     MONITORING/ EVALUATION : RD will continue to follow at moderate risk.     Nutrition Diagnosis: Increased nutrient needs related to Current medical condition as evidenced by BMI less  than 19 , Medical condition associated with diagnosis/ treatment    Verl Bangs, RDLD  06/06/2019, 10:11

## 2019-06-06 NOTE — Nurses Notes (Signed)
Dr. Arlie Solomons into see patient. States that when son Leotis Shames comes in today to see patient to page him so he can speak with son over the phone regarding plan of care from his standpoint.

## 2019-06-06 NOTE — Care Plan (Signed)
Occupational therapy orders received and chart reviewed, but pt currently on hold secondary to awaiting neurology consult regarding OOB orders and LSO brace. Will check back as appropriate.   1 OTNC  Kandice Moos, MOTR/L  06/06/2019, 10:15

## 2019-06-06 NOTE — Consults (Signed)
86 year old very pleasant lady  Frail  Lives independently  Rock at home  Brought to the emergency room  Workup revealed L2, L3, and L4 compression deformities that are mild  Otherwise neurologically intact  Patient complains of back pain but her main pain is in the left anterior rib cage  In addition to the compression fractures there may be grip fractures that are contributing to the patient's discomfort  In such a situation a bracing will be detrimental  I recommend medical pain management and conservative treatment for the fractures  I talked to the patient's son who is in agreement  Patient should be discharged to rehabilitation and not home  I will arrange a follow-up visit in 2 weeks to reassess and determine whether or not she would benefit from a kyphoplasty  No surgical intervention necessary at this time    Full dictation to follow

## 2019-06-06 NOTE — Nurses Notes (Signed)
Dr. Arlie Solomons paged per his request now that son is in. He wanted to speak with son over the phone regarding plan of care.

## 2019-06-06 NOTE — Care Plan (Signed)
Laying in bed with eyes closed. Rested well overnight. Telemetry intact and active. NS infusing. Remains alert and oriented X4. No S/S of pain or distress noted. All safety maintained. Will continue to monitor.

## 2019-06-07 DIAGNOSIS — W19XXXA Unspecified fall, initial encounter: Secondary | ICD-10-CM

## 2019-06-07 DIAGNOSIS — S32020A Wedge compression fracture of second lumbar vertebra, initial encounter for closed fracture: Secondary | ICD-10-CM

## 2019-06-07 DIAGNOSIS — I350 Nonrheumatic aortic (valve) stenosis: Secondary | ICD-10-CM

## 2019-06-07 DIAGNOSIS — E43 Unspecified severe protein-calorie malnutrition: Secondary | ICD-10-CM

## 2019-06-07 DIAGNOSIS — S32030A Wedge compression fracture of third lumbar vertebra, initial encounter for closed fracture: Secondary | ICD-10-CM

## 2019-06-07 DIAGNOSIS — E039 Hypothyroidism, unspecified: Secondary | ICD-10-CM

## 2019-06-07 DIAGNOSIS — S32040A Wedge compression fracture of fourth lumbar vertebra, initial encounter for closed fracture: Secondary | ICD-10-CM

## 2019-06-07 LAB — BASIC METABOLIC PANEL, FASTING
ANION GAP: 5 mmol/L (ref 4–13)
BUN/CREA RATIO: 30 — ABNORMAL HIGH (ref 6–22)
BUN: 29 mg/dL — ABNORMAL HIGH (ref 8–25)
CALCIUM: 7.6 mg/dL — ABNORMAL LOW (ref 8.8–10.2)
CHLORIDE: 108 mmol/L (ref 96–111)
CO2 TOTAL: 23 mmol/L (ref 23–31)
CREATININE: 0.97 mg/dL (ref 0.60–1.05)
ESTIMATED GFR: 51 mL/min/BSA — ABNORMAL LOW (ref >=60)
GLUCOSE: 93 mg/dL (ref 65–125)
POTASSIUM: 4.3 mmol/L (ref 3.5–5.1)
SODIUM: 136 mmol/L (ref 136–145)

## 2019-06-07 LAB — MANUAL DIFF AND MORPHOLOGY-SYSMEX
BASOPHIL #: 0.12 10*3/uL (ref <=0.20)
BASOPHIL %: 1 %
EOSINOPHIL #: 0.46 10*3/uL (ref <=0.50)
EOSINOPHIL %: 4 %
LYMPHOCYTE #: 1.96 10*3/uL (ref 1.00–4.80)
LYMPHOCYTE %: 17 %
MONOCYTE #: 1.15 10*3/uL — ABNORMAL HIGH (ref 0.20–1.10)
MONOCYTE %: 10 %
NEUTROPHIL #: 7.82 10*3/uL — ABNORMAL HIGH (ref 1.50–7.70)
NEUTROPHIL %: 67 %
NEUTROPHIL BANDS %: 1 %
RBC MORPHOLOGY: NORMAL

## 2019-06-07 LAB — CBC WITH DIFF
HCT: 27 % — ABNORMAL LOW (ref 34.8–46.0)
HGB: 8.5 g/dL — ABNORMAL LOW (ref 11.5–16.0)
MCH: 31.3 pg (ref 26.0–32.0)
MCHC: 31.5 g/dL (ref 31.0–35.5)
MCV: 99.3 fL (ref 78.0–100.0)
MPV: 10.3 fL (ref 8.7–12.5)
PLATELETS: 154 10*3/uL (ref 150–400)
RBC: 2.72 10*6/uL — ABNORMAL LOW (ref 3.85–5.22)
RDW-CV: 13.1 % (ref 11.5–15.5)
WBC: 11.5 10*3/uL — ABNORMAL HIGH (ref 3.7–11.0)

## 2019-06-07 MED ORDER — CEPHALEXIN 500 MG CAPSULE
500.00 mg | ORAL_CAPSULE | Freq: Three times a day (TID) | ORAL | 0 refills | Status: AC
Start: 2019-06-07 — End: 2019-06-12

## 2019-06-07 MED ORDER — TRAMADOL 50 MG TABLET
50.00 mg | ORAL_TABLET | Freq: Three times a day (TID) | ORAL | 0 refills | Status: AC | PRN
Start: 2019-06-07 — End: 2019-07-07

## 2019-06-07 NOTE — Care Plan (Signed)
[place]  Occupational Therapy Initial Evaluation    Patient Name: Alexis Stevens  Date of Birth: Mar 21, 1924  Height: Height: 152.4 cm (5')  Weight: Weight: (!) 36.7 kg (81 lb)  Room/Bed: 506/A  Payor: MEDICARE / Plan: MEDICARE PART A AND B / Product Type: Medicare /     Assessment:   (P) Pt will benefit from skilled OT services to increase safety/IND with ADLS, increase asfety with functional transfers and reduce the risk of falls.      Discharge Needs:   Equipment Recommendation: (P) to be determined    Discharge Disposition: (P) to be determined    JUSTIFICATION OF DISCHARGE RECOMMENDATION   Based on current diagnosis, functional performance prior to admission, and current functional performance, this patient requires continued OT services in (P) to be determined  in order to achieve significant functional improvements.    Plan:   Current Intervention: (P) ADL retraining, balance training, endurance training, transfer training    To provide Occupational therapy services (P) minimum of 1x/week, (P) until discharge.       The risks/benefits of therapy have been discussed with the patient/caregiver and he/she is in agreement with the established plan of care.       Subjective & Objective        06/07/19 0932   Therapist Pager   OT Assigned/ Pager # Completed- Merrilyn Puma   Rehab Session   Document Type evaluation   Total OT Minutes: 8   Patient Effort adequate   General Information   Patient Profile Reviewed yes   Onset of Illness/Injury or Date of Surgery 06/06/19   Pertinent History of Current Functional Problem Pt presenting with compression fx of L3 after fall at home.   Medical Lines PIV Line;Telemetry   Respiratory Status room air   Existing Precautions/Restrictions fall precautions   Pre Treatment Status   Pre Treatment Patient Status Patient standing at bedside;Nurse approved session   Support Present Pre Treatment  Clinical assistant present  (PT present)      Communication Pre Treatment  Nurse   Communication Pre Treatment Comment RN approved session   Mutuality/Individual Preferences   Individualized Care Needs Goes by [name]   Lives With *alone, greater than 21 years of age   Living [place] Pt lives at home alone with family checking in on her.   Functional Level Prior   Prior Functional Level Comment Pt is a poor historian. Unable to gather PLOF. Pt was living at home alone.   Pain Assessment   Additional Documentation Pain Scale: Numbers Pre/Post-Treatment (Group)   Pre/Posttreatment Pain Comment no c/o pain but does grimace with movement.   Coping/Psychosocial   Observed Emotional State calm;cooperative   Family/Support System   Family/Support Persons family   Involvement in Care not present at bedside   Cognitive Assessment/Interventions   Behavior/Mood Observations alert;confused;impulsive   Orientation Status oriented to;person   Attention moderate impairment;distractible   Follows Commands follows one step commands;repetition of directions required;verbal cues/prompting required;physical/tactile prompts required   Comment Pt with poor cognition requiring mulitple cues   RUE Assessment   RUE Assessment X- Exceptions   RUE ROM AROM WFL   RUE Strength Not tested   LUE Assessment   LUE Assessment X-Exceptions   LUE ROM AROM WFL   LUE Strength Not tested   Grip Strength   Grip Left (4-/5) good minus, left   Right Grip (4-/5)  good minus, right   Transfer Assessment/Treatment   Sit-Stand Independence minimum assist (75% patient effort);contact guard assist   Stand-Sit Independence minimum assist (75% patient effort);contact guard assist   Sit-Stand-Sit, Assist Device walker, front wheeled   Bed-Chair Independence minimum assist (75% patient effort)   Lower Body Dressing Assessment/Training   Independence Level  dependent (less than 25% patient effort)   Toileting Assessment/Training   Independence Level   dependent (less than 25% patient effort)   Grooming Assessment/Training   Independence Level set up required   Self-Feeding Assessment/Training   Independence Level not tested   Post Treatment Status   Post Treatment Patient Status Patient sitting in bedside chair or w/c;Call light within reach;Sitter select activated   Support Present Metallurgist Comment RN informed of evaluation   Care Plan Goals   OT Rehab Goals Occupational Therapy Goal;Occupational Therapy Goal 2;Occupational Therapy Goal 3;Transfer Training Goal   Occupational Therapy Goals   OT Goal, Date Established 06/07/19   OT Goal, Time to Achieve by discharge   OT Goal, Additional Goal Pt will demonstrate UB BADLS with setup assist wtih DME/AE as needed.   Occupational Therapy Goal 2   OT Goal, Date Established 06/07/19   OT Goal, Time to Achieve by discharge   OT Goal, Additional Goal Pt will tolerate 5+ minutes of functional tasks wtih min rest breaks with min v/c to increase endurance for BADLS.    Occupational Therapy Goal 3   OT Goal, Date Established 06/07/19   OT Goal, Time to Achieve by discharge   OT Goal, Additional Goal Pt will demonstrate fair static standing tolerance/balance with no LOB with DME as needed to increase safety with transfers.   Transfer Training Goal   Transfer Training Goal, Date Established 06/07/19   Transfer Training Goal, Time to Achieve by discharge   Transfer Training Goal, Activity Type all transfers   Transfer Training Goal, Independence Level contact guard assist   Transfer Training Goal, Assist Device walker, rolling   Planned Therapy Interventions, OT Eval   Planned Therapy Interventions ADL retraining;balance training;endurance training;transfer training   Clinical Impression   OT Diagnosis deconditioning, generalized weakness and decreased safety/IND with ADLS   Prognosis  Fair   Functional Level at Time of Session Pt will benefit from  skilled OT services to increase safety/IND with ADLS, increase asfety with functional transfers and reduce the risk of falls.   Criteria for Skilled Therapeutic Interventions Met (OT) yes   Rehab Potential fair, will monitor progress closely   Therapy Frequency minimum of 1x/week   Predicted Duration of Therapy until discharge   Anticipated Equipment Needs at Discharge to be determined   Anticipated Discharge Disposition to be determined   Daily Activity AM-PAC/6-clicks Score   Putting on/Taking off clothing on lower body 2   Bathing 2   Toileting 2   Putting on/Taking off clothing on upper body 3   Personal grooming 3   Eating Meals 3   Raw Score Total 15   Standardized (t-scale) Score 34.69   CMS 0-100% Score 56.46   CMS Modifier CK       Therapist:   Vivien Rota, MOTR/L     Start Time: [phone]  End Time: 0932  Evaluation Time: 8 minutes  Charges Entered: 1 OTE  Department Number: [id number]

## 2019-06-07 NOTE — Nurses Notes (Signed)
PT in to see patient at this time.

## 2019-06-07 NOTE — Care Management Notes (Signed)
Spoke with patient in room this morning to discuss her hospital DC wishes regarding rehab or home.  She says "well of course I'd rather go home."  She then said to talk to her son Trey Paula about these decisions and see what he wants to do and she will go with that.  Informed her I was to have phone conversation with Trey Paula and [name] today.    Spoke with Alvia Grove on the phone.  If dr feels pt is stable for DC today, they would like to get patient home today d/t worry of snow tomorrow and requiring 4 wheel drive to get up to their home.  They plan for patient to come to their home at [address] [place], [place] [address].  They would like hospital bed, bedside commode, and bed alarm if possible.  They said they already had 2 walkers at home.  Discussed home health again as recommended by PT. However, they said they are actually wondering if pt would be appropriate for hospice.  Spoke with Dr. Marina Goodell and he feels she would.  Updated family and sent them ratings through careport.  They selected [facility], saying they had experience with them.  Preference letter obtained and placed in chart.      Referral sent to [facility] in allscripts.  Received call back from [place] with [facility].  She reviewed chart and said pt does qualify with her dx of severe protein calorie malnutrition, after reviewing wt, ht, and BMI.  Morrie Sheldon called [facility] for approval and called back and said they can admit patient today and deliver equipment to the home today.  She is calling Trey Paula and [name] now to see if they have any other questions.  Dr. Marina Goodell and Marijean Niemann, RN updated via epic chat that DC can happen today with hospice.      Morrie Sheldon has requested to be notified with transport time [phone]) and said if possible they prefer for 5 p.m. nurse to admit patient this evening.  Marijean Niemann, RN provided update.  Karlene Lineman, RN  06/07/2019, 11:36.

## 2019-06-07 NOTE — Progress Notes (Signed)
[facility]  Medicine Progress Note  No CPR    Alexis Stevens  Date of service: 06/07/2019    Subjective:  86 y.o., White female with PMH significant for aortic stenosis, hypothyroidism, vitamin D def who presents from home where she lives alone to ER with c/o fall and generalized weakness.  Patient states earlier today she was using her walker walking around her bedroom furniture when she fell into her drawer. She did not lose consciousness or hit her head but did hit her left side on the ground.  Patient was alone however her son and daughter-in-law check on her frequently and after found was brought to ER.  She denies chest pain, shortness of breath, cough, fever, chills. + dysuria and UA pending. Placed on empiric Rocephin.  Patient is very feeble, deconditioned and has been losing weight on an ongoing basis due to poor eating and activity habits. In ER she had trauma CTs and x-rays and was found to have acute mild to moderate compression fracture of L3 and mild compression fracture of L2 and L4.  Neurosurgery was contacted who will see patient in consultation. On 2/13, WBC ct improved. Await UA.  Lying in bed and appears comfortable.    Family has mentioned to staff their plan to live in the same residence as the pt after DC. No interest in skilled or home health. N surg recc medical management and conservative tx. Will follow in 2 weeks.  Possible discharge for today at the medical equipment can be prepared.  Hospice has been consulted.    Vital Signs:  Temp (24hrs) Max:36.9 C (98.4 F)      Systolic (24hrs), Avg:105 , Min:99 , Max:113     Diastolic (24hrs), Avg:52, Min:41, Max:60    Temp  Avg: 36.6 C (97.8 F)  Min: 36.2 C (97.2 F)  Max: 36.9 C (98.4 F)  MAP (Non-Invasive)  Avg: 66.2 mmHG  Min: 58 mmHG  Max: 72 mmHG  Pulse  Avg: 96.2  Min: 67  Max: 129  Resp  Avg: 17.2  Min: 14  Max: 20  SpO2  Avg: 96.3 %  Min: 92 %  Max: 99 %  Pain Score (Numeric, Faces): 0  Fi02    I/O:  I/O last 24  hours:      Intake/Output Summary (Last 24 hours) at 06/07/2019 0720  Last data filed at 06/07/2019 0508  Gross per 24 hour   Intake 750 ml   Output 250 ml   Net 500 ml     I/O current shift:  No intake/output data recorded.  Blood Sugars: Last Fingerstick:  No results found for: GLUCOSEPOC      Inpatient Medications:  acetaminophen (TYLENOL) tablet, 500 mg, Oral, Q4H PRN  cefTRIAXone (ROCEPHIN) 1 g in iso-osmotic 50 mL premix IVPB, 1 g, Intravenous, Q24H  heparin 5,000 unit/mL injection, 5,000 Units, Subcutaneous, Q8HRS  influenza virus vaccine (PF) IM injection (FLUARIX for ages 6 months through adult), 0.5 mL, IntraMUSCULAR, Once  ipratropium-albuterol 0.5 mg-3 mg(2.5 mg base)/3 mL Solution for Nebulization, 3 mL, Nebulization, Q4H PRN  levothyroxine (SYNTHROID) tablet, 25 mcg, Oral, QAM  magnesium hydroxide (MILK OF MAGNESIA) 400mg  per 77mL oral liquid, 15 mL, Oral, Daily PRN  NS flush syringe, 3 mL, Intracatheter, Q8HRS  NS flush syringe, 3 mL, Intracatheter, Q1H PRN  NS flush syringe, 3 mL, Intracatheter, Q8HRS  NS flush syringe, 3 mL, Intracatheter, Q1H PRN  NS premix infusion, , Intravenous, Continuous  ondansetron (ZOFRAN) 2 mg/mL  injection, 4 mg, Intravenous, Q6H PRN  traMADol (ULTRAM) tablet, 50 mg, Oral, Q8H PRN           Allergies:    Allergies   Allergen Reactions   . Sulfa (Sulfonamides)  Other Adverse Reaction (Add comment)     Unknown        Physical Exam:  Constitutional: cachectic  Eyes: Conjunctiva clear.  ENT: ENMT without erythema or injection, mucous membranes moist.  Neck: no thyromegaly or lymphadenopathy  Respiratory: Clear to auscultation bilaterally.   Cardiovascular: regular rate and rhythm  Gastrointestinal: Soft, non-tender  Genitourinary: Deferred  Musculoskeletal: Head atraumatic and normocephalic  Integumentary:  Skin warm and dry    Labs:    CBC Results Differential Results   Recent Labs     06/06/19  0151 06/07/19  0149   WBC 14.2* 11.5*   HGB 9.3* 8.5*   HCT 30.1* 27.0*   PLTCNT 189  154   BANDS 1 1      Recent Labs     06/06/19  0151 06/07/19  0149   LYMPHOCYTES 13 17   MONOCYTES 15 10   EOSINOPHIL 0 4   PMNABS 9.94* 7.82*   MONOSABS 2.13* 1.15*   EOSABS <0.10 0.46          BMP Results Other Chemistries Results   Recent Labs     06/06/19  0151 06/07/19  0149   SODIUM 138 136   POTASSIUM 4.5 4.3   CHLORIDE 106 108   CO2 24 23   BUN 36* 29*   CREATININE 1.15* 0.97   GFR 41* 51*   GLUCOSENF 108 93        Recent Labs     06/05/19  1108 06/06/19  0151 06/07/19  0149   CALCIUM 9.1 8.1* 7.6*   ALBUMIN 3.4 2.8*  --    MAGNESIUM  --  2.0  --       Liver/Pancreas Enzyme Results ABG results   Recent Labs     06/06/19  0151   AST 46*   ALT 20   ALKPHOS 97        No results found for this encounter   Cardiac Results    Coag Results   Recent Labs     06/05/19  1108   TROPONINI 14    Recent Labs     06/05/19  1108   INR 0.98   PROTHROMTME 11.8   APTT 29.9            Assessment/ Plan:   Active Hospital Problems    Diagnosis   . Primary Problem: Compression fracture of lumbar vertebra (CMS HCC)   . Muscular deconditioning   . Severe protein-calorie malnutrition (CMS HCC)   . Fall   . Aortic stenosis   . Hypothyroidism     N surgery following and recc conservative managment. Pain control w Ultram. Consulted nutrition, PT/OT. Await UA w Cx. Encouraged with family present to inc po intake and be more active or risk worsening health status. Plan for DC to home and to live with son and his wife.     DVT/PE Prophylaxis: Heparin    Disposition Planning: Home discharge      Theresa Mulligan, DO

## 2019-06-07 NOTE — Care Plan (Signed)
[facility]  Rehabilitation Services  Physical Therapy Initial Evaluation    Patient Name: Alexis Stevens  Date of Birth: [birth year]  Height: Height: 152.4 cm (5')  Weight: Weight: (!) 36.7 kg (81 lb)  Room/Bed: 506/A  Payor: MEDICARE / Plan: MEDICARE PART A AND B / Product Type: Medicare /     Assessment:      SEen at bedsidd annd able to respond to questions but slow to reacta nd does get minimally disoriebnted at times, yestrday was een by Neurosurgeon and will be treated conservatively for OOB acts and kyphoplasty for now or back barce and will see how she does, was able to get to sitting position and then was able to stand using a ww and min asst of 1 and ambulated for 12 ft and made itr to the bedside chair and left w/all safety measures taken at this time as she is alert enough to help but not able to understand the whole situation at this time, will progress as tolerated and possibly going home to her son or her son and DIL will be at her house 24/7 for her care.    Discharge Needs:    Equipment Recommendation: front wheeled walker      The patient presents with mobility limitations due to impaired balance, impaired range of motion, impaired strength, and impaired functional activity tolerance that significantly impair/prevent patient's ability to participate in mobility-related activities of daily living (MRADLs) including  ambulation and transfers in order to safely complete, toileting, bathing, laundering/household tasks. This functional mobility deficit can be sufficiently resolved with the use of a front wheeled walker  in order to decrease the risk of falls, morbidity, and mortality in performance of these MRADLs.  Patient is able to safely use this assistive device.    Discharge Disposition: TBD    JUSTIFICATION OF DISCHARGE RECOMMENDATION   Based on current diagnosis, functional performance prior to admission, and current functional performance, this patient requires continued PT  services in TBD in order to achieve significant functional improvements in these deficit areas:  .        Plan:   Current Intervention: bed mobility training, balance training, gait training, patient/family education, strengthening, transfer training  To provide physical therapy services minimum of 1x/week  for duration of until discharge.    The risks/benefits of therapy have been discussed with the patient/caregiver and he/she is in agreement with the established plan of care.       Subjective & Objective        06/07/19 0930   Rehab Session   Document Type evaluation;therapy progress note (daily note)   Total PT Minutes: 25   Patient Effort adequate   Patient Effort, Rehab Treatment Comment decreased safety and strength especially in legs   Symptoms Noted During/After Treatment fatigue;shortness of breath   General Information   Patient Profile Reviewed yes   Onset of Illness/Injury or Date of Surgery 06/06/19   Patient/Family/Caregiver Comments/Observations came from home after a fall and compression fx's of L2-L4.   Pertinent History of Current Functional Problem compression fx's, mechanical fall, leukocutosis, dysuria, hypothyroidism, weakness   Medical Lines PIV Line;Telemetry   Respiratory Status room air   Existing Precautions/Restrictions fall precautions;full code;contact isolation   Living Environment   Lives With alone   Living Arrangements house   Living Environment Comment lives alone and son close by   Functional Level Prior   Ambulation 1 - assistive equipment   Transferring 1 - assistive  equipment   Toileting 0 - independent   Self-Care   Dominant Hand right   Usual Activity Tolerance moderate   Current Activity Tolerance fair   Equipment Currently Used at Home walker, standard   Pre Treatment Status   Pre Treatment Patient Status Patient supine in bed   Support Present Pre Treatment  None   Communication Pre Treatment  Nurse   Cognitive Assessment/Interventions   Behavior/Mood Observations  behavior appropriate to situation, WNL/WFL;anxious;distractible;excitable;flat affect;confused   Orientation Status oriented to;person   Attention mild impairment   Follows Commands follows one step commands   Vital Signs   O2 Delivery Post Treatment room air   Pain Assessment   Pretreatment Pain Rating 4/10   Posttreatment Pain Rating 5/10   Pain Location - Side Bilateral   Pain Location back   RUE Assessment   RUE Assessment WFL for stated baseline   LUE Assessment   LUE Assessment WFL for stated baseline   RLE Assessment   RLE Assessment X-Exceptions   RLE Strength 3+/5 to 4-/5   LLE Assessment   LLE Assessment X-Exceptions   LLE Strength 3+ to 4-/5   LLE Other looks frail and slow to move exts due to back and some rib cage pains   Bed Mobility Assessment/Treatment   Supine-Sit Independence contact guard assist   Sit to Supine, Independence contact guard assist   Safety Issues decreased use of legs for bridging/pushing;impaired trunk control for bed mobility   Impairments coordination impaired;balance impaired;endurance;flexibility decreased;motor control impaired;strength decreased   Transfer Assessment/Treatment   Sit-Stand Independence contact guard assist;minimum assist (75% patient effort)   Stand-Sit Independence contact guard assist;minimum assist (75% patient effort)   Sit-Stand-Sit, Assist Device walker, front wheeled   Bed-Chair Independence contact guard assist;minimum assist (75% patient effort)   Chair-Bed Independence contact guard assist;minimum assist (75% patient effort)   Bed-Chair-Bed Assist Device walker, front wheeled   Transfer Safety Issues sequencing ability decreased;balance decreased during turns;step length decreased   Transfer Impairments balance impaired;coordination impaired;endurance;flexibility decreased;motor control impaired;strength decreased;pain   Gait Assessment/Treatment   Independence  minimum assist (75% patient effort)   Assistive Device  walker, front wheeled   Distance  in Feet 12   Gait Speed slow   Deviations  cadence decreased;narrow BOS;step length decreased;stride length decreased;stride width increased   Maintain Weight Bearing Status able to maintain   Safety Issues  balance decreased during turns;sequencing ability decreased;step length decreased   Impairments  balance impaired;coordination impaired;endurance;flexibility decreased;pain;strength decreased   Balance Skill Training   Sitting Balance: Static fair balance   Sitting, Dynamic (Balance) fair balance   Sit-to-Stand Balance fair balance   Standing Balance: Static fair - balance   Standing Balance: Dynamic fair - balance   Functional Endurance Training   Comment, Functional Endurance hasbeen managibg tom lve alone and son close by but has been weaker and fell x 1 w/compression fx's of L2 to L4 and some discomofrt in rig cage as well, admitted for s/p fall and pain , decreased LOC and mental status but better today compared to admission.   Neuromuscular Re-education   Comment, Neuromuscular Re-education decreased safety and strength overall   Post Treatment Status   Post Treatment Patient Status Patient sitting in bedside chair or w/c   Support Present Post Treatment  Clinical assistant present   Communication Post Treatement Nurse   Communication Post Treatment Comment Co-treated w/Occupaitonal therapy atb the end.   Plan of Care Review   Plan Of Care Reviewed With patient  Basic Mobility Am-PAC/6Clicks Score (APPROVED PT Staff and [facility] ONLY   Turning in bed without bedrails 3   Lying on back to sitting on edge of flat bed 2   Moving to and from a bed to a chair 2   Standing up from chair 2   Walk in room 2   Climbing 3-5 steps with railing 2   6 Clicks Raw Score total 13   Standardized (t-scale) score 33.99   CMS 0-100% Score 57.65   CMS Modifier CK   Physical Therapy Clinical Impression   Assessment SEen at bedsidd annd able to respond to questions but slow to reacta nd does get minimally disoriebnted at  times, yestrday was een by Neurosurgeon and will be treated conservatively for OOB acts and kyphoplasty for now or back barce and will see how she does, was able to get to sitting position and then was able to stand using a ww and min asst of 1 and ambulated for 12 ft and made itr to the bedside chair and left w/all safety measures taken at this time as she is alert enough to help but not able to understand the whole situation at this time, will progress as tolerated and possibly going home to her son or her son and DIL will be at her house 24/7 for her care.   Criteria for Skilled Therapeutic yes   Rehab Potential fair, will monitor progress closely   Therapy Frequency minimum of 1x/week   Predicted Duration of Therapy Intervention (days/wks) until discharge   Anticipated Equipment Needs at Discharge (PT) front wheeled walker   Anticipated Discharge Disposition TBD   Evaluation Complexity Justification   Patient History: Co-morbidity/factors that impact Plan of Care 1-2 that impact Plan of Care   Examination Components 1-2 Exam elements addressed   Presentation Evolving: Symptoms, complaints, characteristics of condition changing &/or cognitive deficits present   Clinical Decision Making Low complexity   Evaluation Complexity Low complexity   Care Plan Goals   PT Rehab Goals Physical Therapy Goal;Gait Training Goal;Strength Goal   Physical Therapy Goal   PT  Goal, Date Established 06/07/19   PT Goal, Time to Achieve by discharge   PT Goal, Activity Type safety and balance acts   PT Goal, Independence Level minimum assist (75% patient effort);contact guard assist   PT Goal, Additional Goal no LOB and safer   Gait Training  Goal, Distance to Achieve   Gait Training  Goal, Date Established 06/07/19   Gait Training  Goal, Time to Achieve by discharge   Gait Training  Goal, Independence Level contact guard assist;minimum assist (75% patient effort)   Gait Training  Goal, Assist Device walker, rolling   Gait Training   Goal, Distance to Achieve 30   Gait Training  Goal, Additional Goal bettre tolerance and safer   Strength Goal   Strength Goal, Date Established 06/07/19   Strength Goal, Time to Achieve by discharge   Strength Goal, Measure to Achieve MMT   Strength Goal, Functional Goal increase strength by 1/2 grade overall to improve safety and strength for all acts and functional mobility   Planned Therapy Interventions, PT Eval   Planned Therapy Interventions (PT) bed mobility training;balance training;gait training;patient/family education;strengthening;transfer training       Therapist:   Carson Myrtle, PT     Start Time: [phone]  End Time: 0930  Evaluation Time: 25 minutes  Charges Entered: eval, ptta  Department Number: [id number]

## 2019-06-07 NOTE — Consults (Signed)
HPI  86 year old very pleasant lady  Lives alone at home  Uses a walker to ambulate  Has been experiencing generalized weakness  Larey Seat in her bedroom  Denies loss of consciousness  Evaluated in the emergency room  Multiple compression fractures identified  Neurosurgery consulted    Upon my evaluation she is awake, alert, and oriented  Her main complaint is pain in the anterior lower chest wall  She does also have low back pain  Denies any neck pain  Denies any pain or paresthesias in the extremities  Denies any headaches, nausea vomiting, or any other neurological symptoms    PAST MEDICAL HISTORY  Aortic stenosis, hypothyroidism, vitamin-D deficiency, osteoporosis, hypothyroidism    MEDICATIONS  Keflex, Synthroid    ALLERGIES  Sulfa    SOCIAL HISTORY  Nonsmoker  Her son has medical power of attorney     REVIEW OF SYSTEMS  As per HPI    NEUROLOGICAL EXAM  Very frail  Awake and alert  GCS 15  No external evidence of trauma to the skull  No raccoon eyes or Battle sign  Full passive and active range of motion of cervical spine without pain or tenderness  Moves all extremities equally  Tender in the anterior chest wall  Tender with palpation of the lumbar spinous process  No tenderness in the thoracic spine  Reflexes diminished but symmetric  Gait and coordination deferred    NEUROLOGICAL IMAGING  CT scan of the brain shows no evidence of acute process  CT scan of the cervical spine shows no evidence of acute process  CT scan of the thoracic spine shows no evidence of acute process  CT scan of the lumbar spine reveals superior endplate compression deformities at L2, L3, and L4    IMPRESSION  Multiple lumbar compression fractures (acute)  Rib fractures (clinical impression)    RECOMMENDATIONS  I do not believe we need to intervene surgically at this point  She is very frail  I believe the majority of her pain is coming from my clinical impression of rib fractures  Therefore, bracing for her lumbar spine fractures would be  detrimental to her pulmonary function  I recommend medical pain management for lumbar spine fracture with follow-up in my clinic on outpatient basis in a couple weeks  At that time I can reassess whether or not the benefits of a kyphoplasty outweighed the risks  Strongly recommend an inpatient rehab disposition  Discussed my recommendations with the patient's medical power of attorney--Son  He expressed understanding and completely agrees with the recommended plan regarding treatment of the compression fractures    I will be signing off at this time.  Thank you for allowing me to be a part of your patient's evaluation care.  Please re-consult if needed.

## 2019-06-07 NOTE — Care Plan (Signed)
Awake laying in bed. Rested well overnight. Confused at times but easily reoriented. NS infusing. Telemetry intact and active. Denies pain or any other needs at this time. Encouraged to make all needs known as they arise. All safety maintained. Will continue to monitor.

## 2019-06-07 NOTE — Nurses Notes (Signed)
35- Spoke with patients son Alexis Stevens via telephone. I was informed that hospital bed and BSC would be delivered around 2 pm. Ambulance to be set up for 1700. Informed that I would call him when ambulance gets here with patient. I also called Morrie Sheldon with [facility] and informed that I would also call her when patient leaves facility and is on way home to sons house.     28- called son to go over discharge instructions via telephone. All questions or concerns addressed. Reiterated that I would call him when ambulance arrives.

## 2019-06-07 NOTE — Discharge Summary (Addendum)
[facility]  DISCHARGE SUMMARY      PATIENT NAME:  Alexis Stevens, Alexis Stevens  MRN:  [id number]  DOB:  1923-10-09    ENCOUNTER DATE:  06/05/2019  INPATIENT ADMISSION DATE: 06/05/2019  DISCHARGE DATE:  06/07/2019    ATTENDING PHYSICIAN: Leonette Most, DO  SERVICE: [facility]CM HOSPITALIST  PRIMARY CARE PHYSICIAN: Lacy Duverney, MD     Reason for Admission     Diagnosis    Leukocytosis [100030]          DISCHARGE DIAGNOSIS:     Principal Problem:  Compression fracture of lumbar vertebra (CMS [facility])    Active Hospital Problems    Diagnosis Date Noted   . Principle Problem: Compression fracture of lumbar vertebra (CMS HCC) [S32.000A] 06/05/2019   . Muscular deconditioning [R29.898] 06/06/2019   . Severe protein-calorie malnutrition (CMS HCC) [E43] 06/06/2019   . Fall [W19.XXXA] 06/05/2019   . Aortic stenosis [I35.0]    . Hypothyroidism [E03.9]       Resolved Hospital Problems   No resolved problems to display.     Active Non-Hospital Problems    Diagnosis Date Noted   . Leukocytosis 06/05/2019   . Osteoarthritis    . Low vitamin D level    . Rosacea       Allergies   Allergen Reactions   . Sulfa (Sulfonamides)  Other Adverse Reaction (Add comment)     Unknown            DISCHARGE MEDICATIONS:     Current Discharge Medication List      START taking these medications.      Details   cephalexin 500 mg Capsule  Commonly known as: KEFLEX   500 mg, Oral, 3 TIMES DAILY  Qty: 15 Capsule  Refills: 0     traMADoL 50 mg Tablet  Commonly known as: ULTRAM   50 mg, Oral, EVERY 8 HOURS PRN  Qty: 90 Tablet  Refills: 0        CONTINUE these medications - NO CHANGES were made during your visit.      Details   levothyroxine 25 mcg Tablet  Commonly known as: SYNTHROID   TAKE 1 TABLET BY MOUTH ONCE DAILY. TAKE FIRST THING IN THE MORNING ON AN EMPTY STOMACH. 30 TO 60 MINUTES PRIOR TO ANY OTHER MEDICATIONS WITH  Qty: 90 Tab  Refills: 0          Discharge med list refreshed?  YES        DISCHARGE INSTRUCTIONS:  Follow-up Information     Olive Bass, MD In 2 weeks.    Specialty: NEUROSURGERY  Why: [facility] follow-up  Contact information:  [address]  [address]  [place] [address]  [phone]             Lacy Duverney, MD In 1 week.    Specialty: FAMILY MEDICINE  Contact information:  [address]  [place] [address]  [phone]             Hospice at your home.                  Refer to Hospice - EXTERNAL     DISCHARGE INSTRUCTION - DIET     Diet: RESUME HOME DIET           REASON FOR HOSPITALIZATION AND HOSPITAL COURSE:  This is a 86 y.o., female with PMH significant for aortic stenosis, hypothyroidism, vitamin D def who presents from  home where she lives alone to ER with c/o falland generalized weakness. Patient states earlier today she was using her walker walking around her bedroom furniture when she fell into her drawer.She did not lose consciousness or hit her head but did hit her left side on the ground. Patient was alone however her son and daughter-in-law check on her frequently and after found was brought to ER. She denies chest pain, shortness of breath, cough, fever, chills. + dysuria and UA pending. Placed on empiric Rocephin. Patient is very feeble, deconditioned and has been losing weight on an ongoing basis due to poor eating and activity habits. In ER she had trauma CTs and x-rays and was found to have acute mild to moderate compression fracture of L3 and mild compression fracture of L2 and L4. Neurosurgery was contacted who will see patient in consultation. On 2/13, WBC ct improved. Await UA.  Lying in bed and appears comfortable.  By the 14th, Family has mentioned to staff their plan to live in the same residence as the pt after DC. No interest in skilled or home health. N surg recc medical management and conservative tx. Will follow in 2 weeks.  family has decided to proceed with taking the patient home with hospice.  Hospice notified acid therapy effect to arrange all the home equipment and could  see her today.  Therefore will discharge her to home with the Ultram for pain as well as a few days of antibiotics for her UTI.  She will follow-up with her PCP next week as well as neuro surgery in 2 weeks.      CONDITION ON DISCHARGE:  A. Ambulation: Up with assistance only  B. Self-care Ability: Complete  C. Cognitive Status Alert and Oriented x 3  D. DNR status at discharge: No CPR  E. Lace + Score: 45 (06/07/19 0531)       Advance Directive Information      Most Recent Value   Does the Patient have an Advance Directive?  Yes, Patient Does Have Advance Directive for Healthcare Treatment   Type of Advance Directive Completed  Medical Power of [place]   Name of [place] or Healthcare Surrogate  Jeffry Intriago   Phone Number of [place] or Healthcare Surrogate  [phone]          DISCHARGE DISPOSITION:    DISCHARGE PATIENT   Ordered at: 06/07/19 1132     Is there a planned readmission to acute care within 30 days?    No     Disposition:    HOME/SELF CARE/WITH FAMILY MEMBER/OTHER                     Theresa Mulligan, DO    Time spent on discharge greater than 30 minutes? Yes      Copies sent to Care Team       Relationship Specialty Notifications Start End    Julianne Handler, MD PCP - General EXTERNAL  04/30/17     Phone: [phone] Fax: [phone]         [address] [place] [address]          Referring providers can utilize [email or web address] to access their referred [place] patient's information.       Query: Traumatic rib fx

## 2019-06-07 NOTE — Nurses Notes (Signed)
Pt discharged home via ambulance service  Pt sone and Myriam Jacobson with Hospice notified of same.

## 2019-06-07 NOTE — Care Management Notes (Signed)
Received call from [facility] with [facility].  She says she spoke with family and equipment is to arrive to home today at 2 p.m.  She says family requests for ambulance transport.  Family and Hospice wish to be updated with ambulance transport time.  Marijean Niemann, RN provided update.  Karlene Lineman, RN  06/07/2019, 13:59.

## 2019-06-07 NOTE — Care Plan (Deleted)
Laying in bed with eyes closed. Rested on and off overnight. Remains alert and oriented this shift. PRN pain medication given this shift X1. NS infusing. IV antibiotics given as ordered. Foley catheter draining yellow urine to the bedside drainage bag. Sitter remains at the bedside. No S/S of pain or distress noted. All safety maintained. Will continue to monitor.

## 2019-06-07 NOTE — Nurses Notes (Signed)
Morrie Sheldon with [facility] states she gets off at 8 pm tonight and if patient is still there then to please call main [facility] # [phone])

## 2019-06-08 NOTE — Care Management Notes (Signed)
Referral Information  ++++++ Placed Provider #1 ++++++  Case Manager: Diana Riffle  Provider Type: Hospice - Outpatient  Provider Name: [facility] [place] ([id number])  Address:  [address]  [place], [place] [id number]  Contact: Rachelle Jackson    Phone: [phone] x  Fax:   Fax: [phone]

## 2019-06-10 LAB — ADULT ROUTINE BLOOD CULTURE, SET OF 2 BOTTLES (BACTERIA AND YEAST)
BLOOD CULTURE, ROUTINE: NO GROWTH
BLOOD CULTURE, ROUTINE: NO GROWTH

## 2019-09-22 DEATH — deceased
